# Patient Record
Sex: Male | Born: 1935
Health system: Southern US, Community
[De-identification: ages and names within clinical notes are randomized; demographics above are authoritative.]

## PROBLEM LIST (undated history)

## (undated) DIAGNOSIS — I219 Acute myocardial infarction, unspecified: Secondary | ICD-10-CM

## (undated) DIAGNOSIS — J189 Pneumonia, unspecified organism: Secondary | ICD-10-CM

## (undated) DIAGNOSIS — Z8719 Personal history of other diseases of the digestive system: Secondary | ICD-10-CM

## (undated) DIAGNOSIS — S065XAA Traumatic subdural hemorrhage with loss of consciousness status unknown, initial encounter: Secondary | ICD-10-CM

## (undated) DIAGNOSIS — I5022 Chronic systolic (congestive) heart failure: Secondary | ICD-10-CM

## (undated) DIAGNOSIS — I472 Ventricular tachycardia, unspecified: Secondary | ICD-10-CM

## (undated) DIAGNOSIS — I499 Cardiac arrhythmia, unspecified: Secondary | ICD-10-CM

## (undated) DIAGNOSIS — Z79899 Other long term (current) drug therapy: Secondary | ICD-10-CM

## (undated) DIAGNOSIS — Z95810 Presence of automatic (implantable) cardiac defibrillator: Secondary | ICD-10-CM

## (undated) DIAGNOSIS — S065X9A Traumatic subdural hemorrhage with loss of consciousness of unspecified duration, initial encounter: Secondary | ICD-10-CM

## (undated) DIAGNOSIS — F0390 Unspecified dementia without behavioral disturbance: Secondary | ICD-10-CM

## (undated) DIAGNOSIS — I10 Essential (primary) hypertension: Secondary | ICD-10-CM

## (undated) DIAGNOSIS — F32A Depression, unspecified: Secondary | ICD-10-CM

## (undated) DIAGNOSIS — E039 Hypothyroidism, unspecified: Secondary | ICD-10-CM

## (undated) DIAGNOSIS — I509 Heart failure, unspecified: Secondary | ICD-10-CM

## (undated) DIAGNOSIS — I2510 Atherosclerotic heart disease of native coronary artery without angina pectoris: Secondary | ICD-10-CM

## (undated) DIAGNOSIS — T8859XA Other complications of anesthesia, initial encounter: Secondary | ICD-10-CM

## (undated) DIAGNOSIS — I498 Other specified cardiac arrhythmias: Secondary | ICD-10-CM

## (undated) DIAGNOSIS — E785 Hyperlipidemia, unspecified: Secondary | ICD-10-CM

## (undated) DIAGNOSIS — Z950 Presence of cardiac pacemaker: Secondary | ICD-10-CM

## (undated) DIAGNOSIS — I4901 Ventricular fibrillation: Secondary | ICD-10-CM

## (undated) DIAGNOSIS — I255 Ischemic cardiomyopathy: Secondary | ICD-10-CM

## (undated) DIAGNOSIS — E079 Disorder of thyroid, unspecified: Secondary | ICD-10-CM

## (undated) HISTORY — DX: Ventricular tachycardia, unspecified: I47.20

## (undated) HISTORY — DX: Other long term (current) drug therapy: Z79.899

## (undated) HISTORY — DX: Traumatic subdural hemorrhage with loss of consciousness status unknown, initial encounter: S06.5XAA

## (undated) HISTORY — DX: Ventricular fibrillation: I49.01

## (undated) HISTORY — DX: Disorder of thyroid, unspecified: E07.9

## (undated) HISTORY — DX: Essential (primary) hypertension: I10

## (undated) HISTORY — DX: Hyperlipidemia, unspecified: E78.5

## (undated) HISTORY — PX: OTHER SURGICAL HISTORY: SHX169

## (undated) HISTORY — DX: Presence of automatic (implantable) cardiac defibrillator: Z95.810

## (undated) HISTORY — DX: Ventricular tachycardia: I47.2

## (undated) HISTORY — DX: Ischemic cardiomyopathy: I25.5

## (undated) HISTORY — PX: TONSILLECTOMY: SUR1361

## (undated) HISTORY — DX: Other specified cardiac arrhythmias: I49.8

## (undated) HISTORY — DX: Heart failure, unspecified: I50.9

## (undated) HISTORY — DX: Acute myocardial infarction, unspecified: I21.9

## (undated) HISTORY — DX: Chronic systolic (congestive) heart failure: I50.22

## (undated) HISTORY — PX: MANDIBLE SURGERY: SHX707

## (undated) HISTORY — DX: Traumatic subdural hemorrhage with loss of consciousness of unspecified duration, initial encounter: S06.5X9A

---

## 1993-12-29 HISTORY — PX: CORONARY ARTERY BYPASS GRAFT: SHX141

## 1993-12-29 HISTORY — PX: BYPASS GRAFT: SHX909

## 1998-04-27 ENCOUNTER — Ambulatory Visit (HOSPITAL_COMMUNITY): Admission: RE | Admit: 1998-04-27 | Discharge: 1998-04-27 | Payer: Self-pay | Admitting: Cardiovascular Disease

## 1998-05-17 ENCOUNTER — Ambulatory Visit (HOSPITAL_COMMUNITY): Admission: RE | Admit: 1998-05-17 | Discharge: 1998-05-17 | Payer: Self-pay | Admitting: Surgery

## 1999-10-21 ENCOUNTER — Encounter: Payer: Self-pay | Admitting: Emergency Medicine

## 1999-10-21 ENCOUNTER — Inpatient Hospital Stay (HOSPITAL_COMMUNITY): Admission: EM | Admit: 1999-10-21 | Discharge: 1999-10-29 | Payer: Self-pay | Admitting: Emergency Medicine

## 1999-10-21 ENCOUNTER — Encounter (INDEPENDENT_AMBULATORY_CARE_PROVIDER_SITE_OTHER): Payer: Self-pay

## 1999-10-23 ENCOUNTER — Encounter: Payer: Self-pay | Admitting: Oral Surgery

## 1999-10-24 ENCOUNTER — Encounter: Payer: Self-pay | Admitting: Oral Surgery

## 1999-10-28 ENCOUNTER — Encounter: Payer: Self-pay | Admitting: Oral Surgery

## 2000-11-05 ENCOUNTER — Encounter: Payer: Self-pay | Admitting: Internal Medicine

## 2000-11-05 ENCOUNTER — Ambulatory Visit (HOSPITAL_COMMUNITY): Admission: RE | Admit: 2000-11-05 | Discharge: 2000-11-05 | Payer: Self-pay | Admitting: Neurology

## 2000-11-05 ENCOUNTER — Ambulatory Visit (HOSPITAL_COMMUNITY): Admission: RE | Admit: 2000-11-05 | Discharge: 2000-11-05 | Payer: Self-pay | Admitting: Internal Medicine

## 2000-11-06 ENCOUNTER — Ambulatory Visit (HOSPITAL_COMMUNITY): Admission: RE | Admit: 2000-11-06 | Discharge: 2000-11-17 | Payer: Self-pay | Admitting: Internal Medicine

## 2000-11-12 ENCOUNTER — Ambulatory Visit: Admission: RE | Admit: 2000-11-12 | Discharge: 2000-11-12 | Payer: Self-pay | Admitting: Cardiothoracic Surgery

## 2000-11-12 ENCOUNTER — Encounter: Payer: Self-pay | Admitting: Cardiothoracic Surgery

## 2004-01-30 ENCOUNTER — Ambulatory Visit (HOSPITAL_COMMUNITY): Admission: RE | Admit: 2004-01-30 | Discharge: 2004-01-30 | Payer: Self-pay | Admitting: Internal Medicine

## 2004-01-30 ENCOUNTER — Encounter (INDEPENDENT_AMBULATORY_CARE_PROVIDER_SITE_OTHER): Payer: Self-pay | Admitting: Specialist

## 2005-01-01 ENCOUNTER — Ambulatory Visit: Payer: Self-pay | Admitting: Internal Medicine

## 2005-04-28 ENCOUNTER — Ambulatory Visit: Payer: Self-pay

## 2005-09-08 ENCOUNTER — Ambulatory Visit: Payer: Self-pay | Admitting: Internal Medicine

## 2005-11-09 ENCOUNTER — Emergency Department (HOSPITAL_COMMUNITY): Admission: EM | Admit: 2005-11-09 | Discharge: 2005-11-09 | Payer: Self-pay | Admitting: Emergency Medicine

## 2005-11-10 ENCOUNTER — Inpatient Hospital Stay (HOSPITAL_COMMUNITY): Admission: AD | Admit: 2005-11-10 | Discharge: 2005-11-18 | Payer: Self-pay | Admitting: Neurosurgery

## 2005-11-10 ENCOUNTER — Encounter: Payer: Self-pay | Admitting: Internal Medicine

## 2005-11-28 ENCOUNTER — Encounter: Admission: RE | Admit: 2005-11-28 | Discharge: 2005-11-28 | Payer: Self-pay | Admitting: Cardiovascular Disease

## 2005-12-31 ENCOUNTER — Ambulatory Visit (HOSPITAL_COMMUNITY): Admission: RE | Admit: 2005-12-31 | Discharge: 2005-12-31 | Payer: Self-pay | Admitting: Neurosurgery

## 2006-01-05 ENCOUNTER — Ambulatory Visit: Payer: Self-pay | Admitting: Internal Medicine

## 2006-05-04 ENCOUNTER — Ambulatory Visit: Payer: Self-pay | Admitting: Internal Medicine

## 2006-09-08 ENCOUNTER — Ambulatory Visit: Payer: Self-pay | Admitting: Internal Medicine

## 2007-01-21 ENCOUNTER — Ambulatory Visit: Payer: Self-pay | Admitting: Internal Medicine

## 2007-01-21 LAB — CONVERTED CEMR LAB
CO2: 31 meq/L (ref 19–32)
Calcium: 9 mg/dL (ref 8.4–10.5)
GFR calc Af Amer: 85 mL/min
Glucose, Bld: 97 mg/dL (ref 70–99)
HCT: 43.7 % (ref 39.0–52.0)
Hemoglobin: 15.1 g/dL (ref 13.0–17.0)
INR: 1.3 (ref 0.9–2.0)
MCHC: 34.5 g/dL (ref 30.0–36.0)
MCV: 91.7 fL (ref 78.0–100.0)
Platelets: 241 10*3/uL (ref 150–400)
Sodium: 142 meq/L (ref 135–145)

## 2007-01-25 ENCOUNTER — Encounter: Payer: Self-pay | Admitting: Cardiovascular Disease

## 2007-01-26 ENCOUNTER — Ambulatory Visit: Payer: Self-pay | Admitting: Internal Medicine

## 2007-02-01 ENCOUNTER — Ambulatory Visit: Payer: Self-pay | Admitting: Internal Medicine

## 2007-02-01 ENCOUNTER — Ambulatory Visit (HOSPITAL_COMMUNITY): Admission: RE | Admit: 2007-02-01 | Discharge: 2007-02-01 | Payer: Self-pay | Admitting: Internal Medicine

## 2007-02-15 ENCOUNTER — Ambulatory Visit: Payer: Self-pay

## 2007-03-05 ENCOUNTER — Ambulatory Visit: Payer: Self-pay | Admitting: Internal Medicine

## 2007-05-25 ENCOUNTER — Ambulatory Visit: Payer: Self-pay | Admitting: Internal Medicine

## 2007-08-24 ENCOUNTER — Ambulatory Visit: Payer: Self-pay | Admitting: Internal Medicine

## 2007-11-23 ENCOUNTER — Ambulatory Visit: Payer: Self-pay | Admitting: Internal Medicine

## 2008-01-31 ENCOUNTER — Ambulatory Visit: Payer: Self-pay | Admitting: Internal Medicine

## 2008-05-02 ENCOUNTER — Ambulatory Visit: Payer: Self-pay | Admitting: Internal Medicine

## 2008-08-01 ENCOUNTER — Ambulatory Visit: Payer: Self-pay | Admitting: Internal Medicine

## 2008-10-31 ENCOUNTER — Ambulatory Visit: Payer: Self-pay | Admitting: Internal Medicine

## 2009-02-09 ENCOUNTER — Ambulatory Visit: Payer: Self-pay | Admitting: Internal Medicine

## 2009-05-08 ENCOUNTER — Ambulatory Visit: Payer: Self-pay | Admitting: Internal Medicine

## 2009-05-18 ENCOUNTER — Encounter: Payer: Self-pay | Admitting: Internal Medicine

## 2009-07-24 ENCOUNTER — Encounter: Payer: Self-pay | Admitting: Cardiovascular Disease

## 2009-08-02 ENCOUNTER — Encounter: Payer: Self-pay | Admitting: Internal Medicine

## 2009-08-07 ENCOUNTER — Ambulatory Visit: Payer: Self-pay | Admitting: Internal Medicine

## 2009-08-09 ENCOUNTER — Encounter: Payer: Self-pay | Admitting: Internal Medicine

## 2009-09-12 ENCOUNTER — Ambulatory Visit: Payer: Self-pay | Admitting: Internal Medicine

## 2009-12-11 ENCOUNTER — Ambulatory Visit: Payer: Self-pay | Admitting: Internal Medicine

## 2009-12-12 ENCOUNTER — Encounter: Payer: Self-pay | Admitting: Internal Medicine

## 2009-12-31 ENCOUNTER — Encounter: Payer: Self-pay | Admitting: Internal Medicine

## 2010-03-19 ENCOUNTER — Ambulatory Visit: Payer: Self-pay | Admitting: Internal Medicine

## 2010-03-19 DIAGNOSIS — E785 Hyperlipidemia, unspecified: Secondary | ICD-10-CM

## 2010-03-19 DIAGNOSIS — I10 Essential (primary) hypertension: Secondary | ICD-10-CM

## 2010-06-18 ENCOUNTER — Ambulatory Visit: Payer: Self-pay | Admitting: Internal Medicine

## 2010-07-10 ENCOUNTER — Encounter: Payer: Self-pay | Admitting: Internal Medicine

## 2010-08-16 ENCOUNTER — Ambulatory Visit: Payer: Self-pay | Admitting: Cardiovascular Disease

## 2010-08-16 ENCOUNTER — Encounter: Payer: Self-pay | Admitting: Internal Medicine

## 2010-09-26 ENCOUNTER — Ambulatory Visit: Payer: Self-pay | Admitting: Internal Medicine

## 2010-11-20 ENCOUNTER — Encounter: Payer: Self-pay | Admitting: Internal Medicine

## 2011-01-02 ENCOUNTER — Ambulatory Visit
Admission: RE | Admit: 2011-01-02 | Discharge: 2011-01-02 | Payer: Self-pay | Source: Home / Self Care | Attending: Internal Medicine | Admitting: Internal Medicine

## 2011-01-02 ENCOUNTER — Encounter: Payer: Self-pay | Admitting: Internal Medicine

## 2011-01-19 ENCOUNTER — Inpatient Hospital Stay (HOSPITAL_COMMUNITY)
Admission: EM | Admit: 2011-01-19 | Discharge: 2011-01-23 | Payer: Self-pay | Source: Home / Self Care | Attending: Cardiology | Admitting: Cardiology

## 2011-01-20 ENCOUNTER — Encounter: Payer: Self-pay | Admitting: Internal Medicine

## 2011-01-21 LAB — BASIC METABOLIC PANEL
Calcium: 8.9 mg/dL (ref 8.4–10.5)
GFR calc non Af Amer: >60 mL/min (ref >60)
Glucose, Bld: 162 mg/dL — ABNORMAL HIGH (ref 70–99)
Potassium: 4.3 mEq/L (ref 3.5–5.1)
Sodium: 139 mEq/L (ref 135–145)

## 2011-01-21 LAB — POCT I-STAT, CHEM 8
BUN: 20 mg/dL (ref 6–23)
Calcium, Ion: 1.13 mmol/L (ref 1.12–1.32)
Chloride: 105 mEq/L (ref 96–112)
Creatinine, Ser: 1.5 mg/dL (ref 0.4–1.5)
Glucose, Bld: 159 mg/dL — ABNORMAL HIGH (ref 70–99)
HCT: 48 % (ref 39.0–52.0)
Hemoglobin: 16.3 g/dL (ref 13.0–17.0)
Potassium: 4.5 mEq/L (ref 3.5–5.1)
Sodium: 141 mEq/L (ref 135–145)
TCO2: 24 mmol/L (ref 0–100)

## 2011-01-21 LAB — MRSA PCR SCREENING: MRSA by PCR: POSITIVE — AB

## 2011-01-21 LAB — CARDIAC PANEL(CRET KIN+CKTOT+MB+TROPI)
CK, MB: 8.4 ng/mL (ref 0.3–4.0)
Relative Index: 4.9 — ABNORMAL HIGH (ref 0.0–2.5)
Total CK: 173 U/L (ref 7–232)
Total CK: 146 U/L (ref 7–232)

## 2011-01-21 LAB — POCT CARDIAC MARKERS
CKMB, poc: 2.5 ng/mL (ref 1.0–8.0)
Myoglobin, poc: 212 ng/mL (ref 12–200)
Troponin i, poc: 0.13 ng/mL — ABNORMAL HIGH (ref 0.00–0.09)

## 2011-01-21 LAB — MAGNESIUM: Magnesium: 2 mg/dL (ref 1.5–2.5)

## 2011-01-21 LAB — CK TOTAL AND CKMB (NOT AT ARMC): Total CK: 143 U/L (ref 7–232)

## 2011-01-21 LAB — TSH: TSH: 3.695 u[IU]/mL (ref 0.350–4.500)

## 2011-01-22 LAB — CBC
HCT: 40.6 % (ref 39.0–52.0)
Hemoglobin: 15.6 g/dL (ref 13.0–17.0)
MCH: 31.7 pg (ref 26.0–34.0)
MCHC: 33.8 g/dL (ref 30.0–36.0)
MCV: 93.7 fL (ref 78.0–100.0)
MCV: 92.5 fL (ref 78.0–100.0)
RBC: 4.92 MIL/uL (ref 4.22–5.81)
RBC: 4.39 MIL/uL (ref 4.22–5.81)
WBC: 7.6 10*3/uL (ref 4.0–10.5)

## 2011-01-22 LAB — BASIC METABOLIC PANEL
BUN: 11 mg/dL (ref 6–23)
CO2: 24 mEq/L (ref 19–32)
Chloride: 105 mEq/L (ref 96–112)
Glucose, Bld: 102 mg/dL — ABNORMAL HIGH (ref 70–99)
Potassium: 3.5 mEq/L (ref 3.5–5.1)

## 2011-01-22 NOTE — Procedures (Addendum)
NAMEARBIN, WOOTTON NO.:  [id number]  MEDICAL RECORD NO.:  [id number]          PATIENT TYPE:  INP  LOCATION:  2041                         FACILITY:  [facility]  PHYSICIAN:  Lauree Chandler, MDDATE OF BIRTH:  28-Dec-1935  DATE OF PROCEDURE:  01/21/2011 DATE OF DISCHARGE:                           CARDIAC CATHETERIZATION   PRIMARY CARDIOLOGIST:  Thayer Headings, MD  PRIMARY CARE PHYSICIAN:  Burnard Bunting, MD  PROCEDURES PERFORMED: 1. Left heart catheterization. 2. Selective coronary angiography. 3. Saphenous vein graft angiography. 4. Left internal mammary artery graft angiography. 5. Left ventricular angiogram.  OPERATOR:  Lauree Chandler, MD  INDICATIONS:  This is a 76 year old Caucasian male with a history of ischemic cardiomyopathy with coronary artery disease and prior bypass surgery in 1995, who was admitted to the hospital after episodes of ventricular fibrillation when his defibrillator shocked him.  The patient had a mild elevation in troponin at 0.13.  Diagnostic catheterization was arranged for today to exclude progression of his coronary artery disease as the inciting event for his arrhythmia.  DETAILS OF PROCEDURE:  The patient was brought to the Main Cardiac Catheterization Laboratory after signing informed consent for the procedure.  The right groin was prepped and draped in sterile fashion. Lidocaine 1% was used for local anesthesia.  A 5-French sheath was inserted into the right femoral artery without difficulty.  A JL-4 catheter was used to selectively engage and inject the native left coronary artery.  A JR-4 catheter was used to selectively engage and inject call four saphenous vein grafts.  An IMA catheter was used to selectively engage and inject the left internal mammary artery graft.  A no torque right catheter was used to selectively engage and inject the native right coronary artery.  A pigtail catheter was used to  perform a left ventricular angiogram.  The patient tolerated the procedure well and was taken to the holding area in stable condition.  HEMODYNAMIC FINDINGS:  Central aortic pressure 107/53.  Left ventricular pressure 107/5.  Left ventricular end-diastolic pressure 14.  ANGIOGRAPHIC FINDINGS: 1. The left main coronary artery was short and appeared to have a 40%     ostial stenosis. 2. The left anterior descending had a 100% proximal occlusion.  The     mid and distal vessel filled from the saphenous vein graft.  A     moderate-sized diagonal branch filled from the saphenous vein     graft. 3. The circumflex artery had proximal 30% stenosis and filled a large     obtuse marginal branch from native flow.  The AV groove circumflex     terminated in moderate-sized obtuse marginal branch that filled     both from native flow and from the saphenous vein graft. 4. The native right coronary artery had diffuse 80% stenosis     throughout the proximal segment and 100% mid occlusion.  The distal     vessel and the PDA filled from the saphenous vein graft. 5. The saphenous vein graft to the PDA is patent. 6. Saphenous vein graft to the mid LAD is patent. 7. Saphenous vein graft to the diagonal is patent.  8. Saphenous vein graft to the second obtuse marginal branch of the     circumflex artery is patent. 9. Left internal mammary artery graft to the ramus intermediate vessel     is small and atretic with no significant flow. 10.Left ventricular angiogram was performed in the RAO projection     which showed akinesis of the anterior wall and apex with ejection     fraction of 15-20%.  IMPRESSION: 1. Triple-vessel coronary artery disease, status post five-vessel     coronary artery bypass graft with 4/5 patent bypass grafts. 2. No indication currently for percutaneous coronary intervention. 3. Severe left ventricular systolic dysfunction.  RECOMMENDATIONS:  I recommend continued medical  management at this time.     Lauree Chandler, MD     CM/MEDQ  D:  01/21/2011  T:  01/21/2011  Job:  [id number]  cc:   Thayer Headings, M.D. Burnard Bunting, M.D.  Electronically Signed by Lauree Chandler MD on 01/22/2011 04:13:03 PM
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
## 2011-01-30 NOTE — Cardiovascular Report (Signed)
Summary: Office Visit Remote   Office Visit Remote   Imported By: Sallee Provencal 01/03/2010 12:24:28  _____________________________________________________________________  External Attachment:    Type:   Image     Comment:   External Document

## 2011-01-30 NOTE — Cardiovascular Report (Signed)
Summary: Office Visit Remote   Office Visit Remote   Imported By: Sallee Provencal 07/11/2010 16:43:32  _____________________________________________________________________  External Attachment:    Type:   Image     Comment:   External Document

## 2011-01-30 NOTE — Letter (Signed)
Summary: Remote Device Check  [facility], [place]  [id number] [place]   [place], [place] [address]   Phone: [phone]  Fax: [phone]     November 20, 2010 MRN: [id number]   [place], [place]  [address]   Dear Mr. Cecena,   Your remote transmission was recieved and reviewed by your physician.  All diagnostics were within normal limits for you.  _____Your next transmission is scheduled for:  01-02-2011.  Please transmit at any time this day.  If you have a wireless device your transmission will be sent automatically.   Sincerely,  Shelly Bombard

## 2011-01-30 NOTE — Letter (Signed)
Summary: Remote Device Check  [facility], [place]  [id number] [place]   [place], [place] [address]   Phone: [phone]  Fax: [phone]     July 10, 2010 MRN: [id number]   [place], [place]  [address]   Dear Mr. Reisner,   Your remote transmission was recieved and reviewed by your physician.  All diagnostics were within normal limits for you.  __X___Your next transmission is scheduled for: 09-26-10.  Please transmit at any time this day.  If you have a wireless device your transmission will be sent automatically.    Sincerely,  Shelly Bombard

## 2011-01-30 NOTE — Assessment & Plan Note (Signed)
Summary: defib [email or web address]   CC:  defib check///.  History of Present Illness:   [name] is seen in followup for polymorphic ventricular tachycardia inthe setting of severe ischeimic cardiomyopathy with EF about 15-20% but only class 1-2 symptoms.    He has a history of atrial arrhythmias for which he previously took coumadin, but htis was stopped  following subdural hematoma   He has had no intercurrent discharges and no sob, cp or peripheral edema.   Current Medications (verified): 1)  Coreg 6.25 Mg Tabs (Carvedilol) .... Take One Tablet Two Times A Day 2)  Lisinopril 10 Mg Tabs (Lisinopril) .... Once Daily 3)  Spironolactone 25 Mg Tabs (Spironolactone) .... Once Daily 4)  Digoxin 0.125 Mg Tabs (Digoxin) .... Take One Tablet Once Daily 5)  Gemfibrozil 600 Mg Tabs (Gemfibrozil) .... Take One Tablet Two Times A Day 6)  Aspirin 81 Mg Tbec (Aspirin) .... Take One Tablet By Mouth Daily 7)  Slo-Niacin 500 Mg Cr-Tabs (Niacin) .... Take 4 Tablets Once Daily  Allergies (verified): No Known Drug Allergies  Past History:  Past Medical History:  Past Medical History: ICD implanted for inducible ventricular tachycardia/ventricular      fibrillation.--Medtronic Maximo DR [phone] Ischemic cardiomyopathy      a.     Myocardial infarction 1995.      b.     Three-vessel coronary artery disease status post coronary       artery bypass graft surgery.      c.     I-II chronic systolic congestive heart failure.      d.     Ejection fraction 29% on Myoview.  No ischemia. Weight loss of 48 pounds since 1995 when he had his heart attack. Cardioverter defibrillator discharges x2 on Saturday, February 2,      when the patient was walking briskly. He is totally asymptomatic      with that. Dyslipidemia Hypertension  Past Surgical History: Bilateral craniotomies for subdural hematomas implanted defibrillator-Medtronic Maximo DR [id number]  Vital Signs:  Patient profile:   76 year old  male Height:      71.5 inches Weight:      166 pounds BMI:     22.91 Pulse rate:   74 / minute Pulse rhythm:   regular BP sitting:   98 / 62  (left arm) Cuff size:   regular  Vitals Entered By: Doug Sou CMA (March 19, 2010 4:19 PM)  Physical Exam  General:  The patient was alert and oriented in no acute distress. HEENT Normal.  Neck veins were flat, carotids were brisk.  Lungs were clear.  Heart sounds were regular without murmurs or gallops.  Abdomen was soft with active bowel sounds. There is no clubbing cyanosis or edema. Skin Warm and dry     ICD Specifications Following MD:  Virl Axe, MD     ICD Vendor:  Medtronic     ICD Model Number:  [id number]     ICD Serial Number:  [id number] ICD DOI:  02/01/2007     ICD Implanting MD:  Virl Axe, MD  Lead 1:    Location: RA     DOI: 05/17/1998     Model #: [id number]     Serial #: [id number]     Status: active Lead 2:    Location: RV     DOI: 12/25/2000     Model #: [id number]     Serial #: [id number]     Status: active  ICD Follow Up Remote  Check?  No Battery Voltage:  3.10 V     Charge Time:  9.4 seconds     Underlying rhythm:  SR ICD Dependent:  No       ICD Device Measurements Atrium:  Amplitude: 2.6 mV, Impedance: 504 ohms, Threshold: 1.0 V at 0.2 msec Right Ventricle:  Amplitude: 8.9 mV, Impedance: 464 ohms, Threshold: 1.0 V at 0.2 msec Shock Impedance: 40/53 ohms   Episodes MS Episodes:  331     Coumadin:  No Shock:  0     ATP:  0     Nonsustained:  21     Atrial Pacing:  72.4%     Ventricular Pacing:  31.5%  Brady Parameters Mode DDDR     Lower Rate Limit:  60     Upper Rate Limit 110 PAV 300     Sensed AV Delay:  300  Tachy Zones VF:  200     VT:  250 FVT VIA VF     VT1:  150     Next Remote Date:  06/18/2010     Next Cardiology Appt Due:  02/27/2011 Tech Comments:  Normal device function. SAV changed from 110 to 341msec today to allow for intrinsic conduction while A pacing. No other changes made.  All mode switch  episodes less than 1 minute.  No EGM's for NSVT episodes.  Pt does [facility] transmissions.  ROV 12 months SK. Chanetta Marshall RN BSN  March 19, 2010 4:42 PM   Impression & Recommendations:  Problem # 1:  VENTRICULAR TACHYCARDIA-POLYMORPHIC (ICD-427.1) no intercurrent arrhtyhmias His updated medication list for this problem includes:    Coreg 6.25 Mg Tabs (Carvedilol) .[name] Kitchen... Take one tablet two times a day    Lisinopril 10 Mg Tabs (Lisinopril) ..... Once daily    Aspirin 81 Mg Tbec (Aspirin) .[name] Kitchen... Take one tablet by mouth daily  Problem # 2:  IMPLANTATION OF DEFIBRILLATOR-MEDTRONIC MAXIMO DR [id number] (ICD-V45.02) Device parameters and data were reviewed and no changes were made  Problem # 3:  CARDIOMYOPATHY, ISCHEMIC EF 20% (ICD-414.8) No symptoms,  will continue current meds  His updated medication list for this problem includes:    Coreg 6.25 Mg Tabs (Carvedilol) .[name] Kitchen... Take one tablet two times a day    Lisinopril 10 Mg Tabs (Lisinopril) ..... Once daily    Spironolactone 25 Mg Tabs (Spironolactone) ..... Once daily    Digoxin 0.125 Mg Tabs (Digoxin) .[name] Kitchen... Take one tablet once daily    Aspirin 81 Mg Tbec (Aspirin) .[name] Kitchen... Take one tablet by mouth daily

## 2011-01-30 NOTE — Letter (Signed)
Summary: [facility] Progress Note   [facility] Progress Note   Imported By: Sallee Provencal 08/23/2010 10:57:57  _____________________________________________________________________  External Attachment:    Type:   Image     Comment:   External Document

## 2011-01-30 NOTE — Letter (Signed)
Summary: Remote Device Check  [facility], [place]  [id number] [place]   [place], [place] [address]   Phone: [phone]  Fax: [phone]     December 31, 2009 MRN: [id number]   [place], [place]  [address]   Dear Mr. Gasior,   Your remote transmission was recieved and reviewed by your physician.  All diagnostics were within normal limits for you.    ___X___Your next office visit is scheduled for:  FEBRUARY 2011 [place]. Please call our office to schedule an appointment.    Sincerely,  Hotel manager

## 2011-01-30 NOTE — Cardiovascular Report (Signed)
Summary: Office Visit Remote   Office Visit Remote   Imported By: Sallee Provencal 12/02/2010 14:58:56  _____________________________________________________________________  External Attachment:    Type:   Image     Comment:   External Document
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
## 2011-02-07 ENCOUNTER — Encounter: Payer: Self-pay | Admitting: Internal Medicine

## 2011-02-07 ENCOUNTER — Encounter (INDEPENDENT_AMBULATORY_CARE_PROVIDER_SITE_OTHER): Payer: Medicare Other | Admitting: Internal Medicine

## 2011-02-07 DIAGNOSIS — I2589 Other forms of chronic ischemic heart disease: Secondary | ICD-10-CM

## 2011-02-07 DIAGNOSIS — R110 Nausea: Secondary | ICD-10-CM

## 2011-02-07 DIAGNOSIS — R259 Unspecified abnormal involuntary movements: Secondary | ICD-10-CM

## 2011-02-07 DIAGNOSIS — I472 Ventricular tachycardia: Secondary | ICD-10-CM

## 2011-02-07 NOTE — Discharge Summary (Signed)
NAMEPORFIRIO, [name] NO.:  [id number]  MEDICAL RECORD NO.:  [id number]           PATIENT TYPE:  LOCATION:                                 FACILITY:  PHYSICIAN:  Thayer Headings, M.D. DATE OF BIRTH:  December 06, 1935  DATE OF ADMISSION:  01/19/2011 DATE OF DISCHARGE:                              DISCHARGE SUMMARY   DATE OF DISCHARGE:  January 23, 2011.  PROCEDURES: 1. Left heart catheterization. 2. Coronary arteriogram. 3. Left ventriculogram. 4. LIMA arteriogram. 5. Saphenous vein angiogram. 6. Chest x-ray.  PRIMARY FINAL DISCHARGE DIAGNOSES: 1. Ventricular tachycardia storm. 2. Non-ST elevation MI and coronary artery disease with four out of five grafts patent at     catheterization.  SECONDARY DIAGNOSES: 1. Status post aortocoronary bypass surgery in 1995 with SVG to LAD,     SVG to diagonal, LIMA to ramus intermedius, SVG to OM-2, SVG to     PDA. 2. Status post cardiac catheterization this admission showing severe     native three-vessel disease with all grafts patent except for the     LIMA to ramus, which was atretic. 3. Ischemic cardiomyopathy with an ejection fraction of 15% to 20% at     catheterization. 4. History of Ventricular fibrillation arrest in 1995 at the time of     his bypass as well as ventricular tachycardia, status post     Medtronic subpectoral ICD with a change out to a Medtronic Maximo     in February 2008. 5. History of myocardial infarction in 1995. 6. Dyslipidemia. 7. Hypertension. 8. History of chronic subdural hematomas in 2006 requiring surgical     intervention, Coumadin discontinued. 9. History of Coumadin use secondary to left ventricular dysfunction,     discontinued. 10.History of colonoscopy showing diverticulosis and colon polyps. 0000000 systolic congestive heart failure.  Time at discharge, 44     minutes.  HOSPITAL COURSE:  Mr. [name] is a 76 year old male with a history of coronary artery disease.  He  came to the hospital on the day of admission because his defibrillator fired.  He was admitted for further evaluation and treatment.  The defibrillator was interrogated, and he was found to have multiple episodes of VT.  His platelets were slightly low, giving him mild thrombocytopenia and were 142 at discharge.  He ruled in for non-ST- segment elevation MI with elevated cardiac enzymes, peak troponin 2.03, and peak CK-MB 143/8.7.  He was MRSA positive by PCR.  His TSH was within normal limits.  His blood sugars showed some elevation with a fasting blood sugar of 102.  He has had some mild elevation in his blood sugars before, but no hemoglobin A1c was done.  He will be encouraged to follow up with his family physician for this.  Because of his non-ST-segment elevation MI, he had a cath on January 21, 2011.  He had severe native three-vessel disease, but all grafts were patent except for the LIMA to ramus intermedius, which was atretic. That graft had no significant flow.  His EF was 15% to 20%.  Dr. Merlene Pulling reviewed the films and discussed the results with  Dr. Acie Fredrickson. Medical therapy was recommended, and there were no vessels amenable to percutaneous intervention.  Mr. [name] had been started on IV amiodarone because of the VT storm. His device was interrogated, and the MVP detection interval was increased to 261 beats per minute (230 milliseconds).  He will follow up with Dr. Caryl Comes as an outpatient.  On January 22, 2011, he was changed from IV to p.o. amiodarone and kept another 24 hours to make sure he did not have any further VT.  On January 23, 2011, Mr. [name] condition was stable.  He was having short runs of nonsustained VT, but was asymptomatic.  He was tolerating the amiodarone well.  He was evaluated by Dr. Acie Fredrickson and considered stable for discharge in improved condition, to follow up as an outpatient.  DISCHARGE INSTRUCTIONS:  His activity level is to be  increased gradually.  He is to call our office for problems with the cath site. He is to follow up with Dr. Acie Fredrickson in approximately 4 weeks, and an appointment will be arranged.  He is to follow up with Dr. Caryl Comes on February 10 at 9:10 a.m.  He is to follow up with Dr. Reynaldo Minium as needed.  DISCHARGE MEDICATIONS: 1. Amiodarone 200 mg 2 tablets b.i.d. for 2 weeks and 1 tablet b.i.d.     for 2 weeks, then 1 tablet daily. 2. Lisinopril 10 mg daily. 3. Mupirocin ointment b.i.d. for 5 days. 4. Niacin 500 mg 4 caps at bedtime. 5. Coreg 6.25 mg b.i.d. 6. Lanoxin 0.125 mg daily. 7. Gemfibrozil 600 mg b.i.d. 8. Aldactone 25 mg daily. 9. Multivitamin daily. 10.Aspirin 81 mg daily. 11.Grape seed extract daily. 12.Vitamin B complex with C daily. 13.Flomax 0.4 mg at bedtime.    Rosaria Ferries, PA-C   ______________________________ Thayer Headings, M.D.   RB/MEDQ  D:  01/23/2011  T:  01/24/2011  Job:  [id number]  cc:   Burnard Bunting, M.D.  Electronically Signed by Rosaria Ferries PA-C on 01/29/2011 02:48:02 PM Electronically Signed by Mertie Moores M.D. on 02/07/2011 04:50:35 PM

## 2011-02-09 ENCOUNTER — Encounter (INDEPENDENT_AMBULATORY_CARE_PROVIDER_SITE_OTHER): Payer: Self-pay | Admitting: *Deleted

## 2011-02-13 NOTE — Consult Note (Addendum)
Summary: [facility] Consultation Report  [facility] Consultation Report   Imported By: Roddie Mc 01/31/2011 18:38:42  _____________________________________________________________________  External Attachment:    Type:   Image     Comment:   External Document

## 2011-02-18 ENCOUNTER — Ambulatory Visit (INDEPENDENT_AMBULATORY_CARE_PROVIDER_SITE_OTHER): Payer: Medicare Other | Admitting: Cardiovascular Disease

## 2011-02-18 ENCOUNTER — Encounter: Payer: Self-pay | Admitting: Internal Medicine

## 2011-02-18 DIAGNOSIS — I472 Ventricular tachycardia: Secondary | ICD-10-CM

## 2011-02-19 NOTE — Assessment & Plan Note (Signed)
Summary: defib check per amber/medtronic icd/lg   History of Present Illness:   [name] is seen in followup for polymorphic ventricular tachycardia inthe setting of severe ischeimic cardiomyopathy wtih prior bypass 1995 with EF about 15-20%;  he was recentlyt admitted for VT storm for which amiodarone was initiated.  cath demonstrated severe native three-vessel disease with all grafts patent except for the       LIMA to ramus, which was atretic. EF unchanged he is haveing problems with jitteriness and nausea  He has a history of atrial arrhythmias for which he previously took coumadin, but htis was stopped  following subdural hematoma     Current Medications (verified): 1)  Coreg 6.25 Mg Tabs (Carvedilol) .... Take One Tablet Two Times A Day 2)  Lisinopril 10 Mg Tabs (Lisinopril) .... Once Daily 3)  Spironolactone 25 Mg Tabs (Spironolactone) .... Once Daily 4)  Digoxin 0.125 Mg Tabs (Digoxin) .... Take One Tablet Once Daily 5)  Gemfibrozil 600 Mg Tabs (Gemfibrozil) .... Take One Tablet Two Times A Day 6)  Aspirin 81 Mg Tbec (Aspirin) .... Take One Tablet By Mouth Daily 7)  Slo-Niacin 500 Mg Cr-Tabs (Niacin) .... Take 4 Tablets Once Daily 8)  Amiodarone Hcl 200 Mg Tabs (Amiodarone Hcl) .... Take One Tablet Two Times A Day  Allergies (verified): No Known Drug Allergies  Past History:  Past Medical History: Last updated: 03/19/2010  Past Medical History: ICD implanted for inducible ventricular tachycardia/ventricular      fibrillation.--Medtronic Maximo DR [phone] Ischemic cardiomyopathy      a.     Myocardial infarction 1995.      b.     Three-vessel coronary artery disease status post coronary       artery bypass graft surgery.      c.     I-II chronic systolic congestive heart failure.      d.     Ejection fraction 29% on Myoview.  No ischemia. Weight loss of 48 pounds since 1995 when he had his heart attack. Cardioverter defibrillator discharges x2 on Saturday, February 2,     when the patient was walking briskly. He is totally asymptomatic      with that. Dyslipidemia Hypertension  Past Surgical History: Last updated: 03/19/2010 Bilateral craniotomies for subdural hematomas implanted defibrillator-Medtronic Maximo DR 7278  Vital Signs:  Patient profile:   76 year old male Height:      71.5 inches Weight:      166 pounds BMI:     22.91 Pulse rate:   63 / minute Pulse rhythm:   regular BP sitting:   118 / 64  (right arm)  Vitals Entered By: Doug Sou CMA (February 07, 2011 9:05 AM)  Physical Exam  General:  The patient was alert and oriented in no acute distress. HEENT Normal.  Neck veins were flat, carotids were brisk.  Lungs were clear.  Heart sounds were regular without murmurs or gallops.  Abdomen was soft with active bowel sounds. There is no clubbing cyanosis or edema. Skin Warm and dry    EKG  Procedure date:  02/07/2011  Findings:      atrial paced at 64 Intermittently conducted broad QRS of the right bundle left axis Her anteroseptal MI Intervals 0.16 equals QRS 0.44 equals q.d.   ICD Specifications Following MD:  Virl Axe, MD     ICD Vendor:  Medtronic     ICD Model Number:  [id number]     ICD Serial Number:  [id number] ICD DOI:  02/01/2007     ICD Implanting MD:  Virl Axe, MD  Lead 1:    Location: RA     DOI: 05/17/1998     Model #: [id number]     Serial #: [id number]     Status: active Lead 2:    Location: RV     DOI: 12/25/2000     Model #: [id number]     Serial #: [id number]     Status: active  ICD Follow Up Remote Check?  No Battery Voltage:  3.02 V     Charge Time:  8.12 seconds     Underlying rhythm:  SR ICD Dependent:  No       ICD Device Measurements Atrium:  Amplitude: 2.0 mV, Impedance: 472 ohms, Threshold: 1.0 V at 0.3 msec Right Ventricle:  Amplitude: 7.1 mV, Impedance: 480 ohms, Threshold: 1.0 V at 0.4 msec Shock Impedance: 40/52 ohms   Episodes MS Episodes:  90     Percent Mode Switch:  90     Coumadin:   No Shock:  0     ATP:  0     Nonsustained:  0     Atrial Pacing:  74.9%     Ventricular Pacing:  14.9%  Brady Parameters Mode DDDR     Lower Rate Limit:  60     Upper Rate Limit 110 PAV 300     Sensed AV Delay:  300  Tachy Zones VF:  200     VT:  250 FVT VIA VF     VT1:  150     Next Cardiology Appt Due:  03/21/2011 Tech Comments:  Lead impedance alert values reprogrammed.  No programming changes.  ROV 6 weeks with Dr. Nelly Laurence, LPN  February 10, [id number] 9:35 AM   Impression & Recommendations:  Problem # 1:  VENTRICULAR TACHYCARDIA-POLYMORPHIC (ICD-427.1) tpatient has had recent VT storm. The records were reviewed; he is on amiodarone. He had some problems with the amiodarone including nausea and jitteriness. I that these are dose related. We will plan to decrease his dose from 400-200 and about 3 weeks' time. I will see him in 6 weeks' time at that juncture we will need a TSH and LFTs. His updated medication list for this problem includes:    Coreg 6.25 Mg Tabs (Carvedilol) .[name] Kitchen... Take one tablet two times a day    Lisinopril 10 Mg Tabs (Lisinopril) ..... Once daily    Aspirin 81 Mg Tbec (Aspirin) .[name] Kitchen... Take one tablet by mouth daily    Amiodarone Hcl 200 Mg Tabs (Amiodarone hcl) .[name] Kitchen... Take one tablet two times a day  Orders: EKG w/ Interpretation (93000)  Problem # 2:  TREMOR (ICD-781.0) As above  Problem # 3:  NAUSEA (ICD-787.02)  as aboveHe is also on digoxin. There may be amiodarone digoxin interaction here we will measure his digoxin level Orders: T-Digoxin [id number])  Problem # 4:  CARDIOMYOPATHY, ISCHEMIC EF 20% (ICD-414.8) he is on excellent medications  Problem # 5:  IMPLANTATION OF DEFIBRILLATOR-MEDTRONIC MAXIMO DR 7278 (ICD-V45.02) Device parameters and data were reviewed and no changes were made  Patient Instructions: 1)  Your physician has recommended you make the following change in your medication: in 3 weeks decrease Amiodarone to 200mg  1 time per  day 2)  Your physician recommends that you return for lab work in: dig level today....we will call you with results. 3)  Your physician recommends that you schedule a follow-up appointment in: 6 weeks with Dr.Gurnoor Sloop 4)  needs TSH and Hepatic panel in 6 weeks 427.1

## 2011-02-19 NOTE — Cardiovascular Report (Signed)
Summary: Office Visit Remote   Office Visit Remote   Imported By: Sallee Provencal 02/11/2011 15:13:39  _____________________________________________________________________  External Attachment:    Type:   Image     Comment:   External Document

## 2011-02-19 NOTE — Letter (Signed)
Summary: Remote Device Check  [facility], [place]  [id number] [address] [place]   [place], [place] [address]   Phone: [phone]  Fax: [phone]     February 09, 2011 MRN: [id number]   Mora, Alejandro  [id number]   Dear [name],   Your remote transmission was recieved and reviewed by your physician.  All diagnostics were within normal limits for you.  __X____Your next office visit is scheduled for:   03-17-2011 @ 115pm with Dr Caryl Comes.     Sincerely,  Shelly Bombard

## 2011-02-25 NOTE — Letter (Signed)
Summary: [facility]   Imported By: Marilynne Drivers 02/19/2011 12:05:48  _____________________________________________________________________  External Attachment:    Type:   Image     Comment:   External Document

## 2011-02-28 ENCOUNTER — Ambulatory Visit (INDEPENDENT_AMBULATORY_CARE_PROVIDER_SITE_OTHER): Payer: Medicare Other | Admitting: Cardiovascular Disease

## 2011-02-28 DIAGNOSIS — Z951 Presence of aortocoronary bypass graft: Secondary | ICD-10-CM

## 2011-02-28 DIAGNOSIS — I5022 Chronic systolic (congestive) heart failure: Secondary | ICD-10-CM

## 2011-02-28 DIAGNOSIS — Z95810 Presence of automatic (implantable) cardiac defibrillator: Secondary | ICD-10-CM

## 2011-03-11 ENCOUNTER — Ambulatory Visit (INDEPENDENT_AMBULATORY_CARE_PROVIDER_SITE_OTHER): Payer: Medicare Other | Admitting: Cardiovascular Disease

## 2011-03-11 DIAGNOSIS — I472 Ventricular tachycardia: Secondary | ICD-10-CM

## 2011-03-11 DIAGNOSIS — I959 Hypotension, unspecified: Secondary | ICD-10-CM

## 2011-03-11 NOTE — Letter (Signed)
Summary: [facility] Progress Note   [facility] Progress Note   Imported By: Sallee Provencal 03/03/2011 15:25:30  _____________________________________________________________________  External Attachment:    Type:   Image     Comment:   External Document

## 2011-03-17 ENCOUNTER — Encounter: Payer: Self-pay | Admitting: Internal Medicine

## 2011-03-18 ENCOUNTER — Telehealth: Payer: Self-pay | Admitting: *Deleted

## 2011-03-18 ENCOUNTER — Encounter: Payer: Self-pay | Admitting: Internal Medicine

## 2011-03-18 NOTE — Telephone Encounter (Signed)
Assessed pt.  States he is feeling well.  Currently taking the Lisinopril 5mg  1/2 tab BID.  States his BP is stable and he is feeling well.  Will continue to monitor pt.

## 2011-03-19 ENCOUNTER — Telehealth: Payer: Self-pay | Admitting: *Deleted

## 2011-03-19 NOTE — Telephone Encounter (Signed)
Great.  Thank you for keeping up with him.

## 2011-03-19 NOTE — Telephone Encounter (Signed)
Called and assessed pt.  Pt states he is feeling well.   BP is stable 130/80;  Pt is still on Lisinopril 5mg  BID;  RN will call back next week to check on pt.  He was advised to call sooner if any problems occur.

## 2011-03-26 ENCOUNTER — Telehealth: Payer: Self-pay | Admitting: *Deleted

## 2011-03-26 NOTE — Telephone Encounter (Signed)
Called pt. To check on his status.  States he is feeling well.  No problems with low BP or dizziness noted;  Pt is on the way to beach for the weekend.  Instructed pt. To call if any problems arise.

## 2011-03-31 ENCOUNTER — Encounter: Payer: Self-pay | Admitting: Internal Medicine

## 2011-03-31 ENCOUNTER — Other Ambulatory Visit (INDEPENDENT_AMBULATORY_CARE_PROVIDER_SITE_OTHER): Payer: Medicare Other | Admitting: *Deleted

## 2011-03-31 ENCOUNTER — Ambulatory Visit (INDEPENDENT_AMBULATORY_CARE_PROVIDER_SITE_OTHER): Payer: Medicare Other | Admitting: Internal Medicine

## 2011-03-31 DIAGNOSIS — Z79899 Other long term (current) drug therapy: Secondary | ICD-10-CM

## 2011-03-31 DIAGNOSIS — R0989 Other specified symptoms and signs involving the circulatory and respiratory systems: Secondary | ICD-10-CM

## 2011-03-31 DIAGNOSIS — I472 Ventricular tachycardia, unspecified: Secondary | ICD-10-CM | POA: Insufficient documentation

## 2011-03-31 DIAGNOSIS — I4901 Ventricular fibrillation: Secondary | ICD-10-CM

## 2011-03-31 DIAGNOSIS — I2589 Other forms of chronic ischemic heart disease: Secondary | ICD-10-CM

## 2011-03-31 DIAGNOSIS — Z95810 Presence of automatic (implantable) cardiac defibrillator: Secondary | ICD-10-CM

## 2011-03-31 DIAGNOSIS — I5022 Chronic systolic (congestive) heart failure: Secondary | ICD-10-CM

## 2011-03-31 DIAGNOSIS — I255 Ischemic cardiomyopathy: Secondary | ICD-10-CM

## 2011-03-31 DIAGNOSIS — I498 Other specified cardiac arrhythmias: Secondary | ICD-10-CM

## 2011-03-31 DIAGNOSIS — I509 Heart failure, unspecified: Secondary | ICD-10-CM

## 2011-03-31 LAB — HEPATIC FUNCTION PANEL
ALT: 39 U/L (ref 0–53)
Total Bilirubin: 0.5 mg/dL (ref 0.3–1.2)

## 2011-03-31 LAB — BASIC METABOLIC PANEL
CO2: 27 mEq/L (ref 19–32)
Calcium: 8.8 mg/dL (ref 8.4–10.5)
Glucose, Bld: 82 mg/dL (ref 70–99)
Sodium: 134 mEq/L — ABNORMAL LOW (ref 135–145)

## 2011-03-31 NOTE — Patient Instructions (Signed)
Your physician recommends that you return for lab work in: today bmet liver tsh   Your physician recommends that you schedule a follow-up appointment in: 6 months WITH DR Caryl Comes

## 2011-03-31 NOTE — Assessment & Plan Note (Addendum)
No recurrent VT;  Continue amio at current dose  Will think about decreasing in about 6-9 months  amio surveillance labs due today

## 2011-03-31 NOTE — Progress Notes (Signed)
  HPI  [name] is a 76 y.o. male  Recently hospitalized for ventricular tachycardia storm and non-STEMI.  He has a history of ischemic cardiomyopathy with prior myocardial infarction, prior bypass surgery in 1995 with recent catheterization (January 2012) demonstrated patent vein grafts with an atretic LIMA to his ramus. Ejection fraction was 15-20%.  He was treated with amiodarone; when we saw  Him in Feb we noted nausea and jitteriness and decreased his amio from 400-200 daily;  These symptoms have largely abated.  Patient denies symptoms of GI intolerance, sun sensitivity, neurological symptoms attributable to amiodarone.    He has a history of reported cardiac arrest and is status post ICD implantation.   The patient denies chest pain, shortness of breath, nocturnal dyspnea, orthopnea or peripheral edema.  There have been no palpitations, lightheadedness or syncope.         Past Medical History  Diagnosis Date  . Myocardial infarction     1995  . CAD (coronary artery disease)   . Dyslipidemia   . Hypertension     Past Surgical History  Procedure Date  . Insert / replace / remove pacemaker     [place] DR [id number]  . Bilateral cranitomies for sub hematomas     Current Outpatient Prescriptions  Medication Sig Dispense Refill  . amiodarone (PACERONE) 200 MG tablet Take 200 mg by mouth daily.       [name] Kitchen aspirin 81 MG tablet Take 81 mg by mouth daily.        . carvedilol (COREG) 6.25 MG tablet Take 6.25 mg by mouth 2 (two) times daily with a meal.        . gemfibrozil (LOPID) 600 MG tablet Take 600 mg by mouth 2 (two) times daily before a meal.        . levothyroxine (SYNTHROID, LEVOTHROID) 25 MCG tablet Take 1 tablet by mouth daily.      [name] Kitchen lisinopril (PRINIVIL,ZESTRIL) 5 MG tablet Take 5 mg by mouth daily.        . niacin (SLO-NIACIN) 500 MG tablet Take 1,000 mg by mouth 2 (two) times daily with a meal.       . spironolactone (ALDACTONE) 25 MG tablet Take 25 mg by  mouth daily.        . Tamsulosin HCl (FLOMAX) 0.4 MG CAPS Take 1 tablet by mouth daily.      . digoxin (LANOXIN) 0.125 MG tablet Take 125 mcg by mouth daily.        [name] Kitchen DISCONTD: lisinopril (PRINIVIL,ZESTRIL) 10 MG tablet Take 10 mg by mouth daily.         No Known Allergies  Review of Systems negative except from HPI and PMH  Physical Exam Well developed and well nourished in no acute distress HENT normal E scleral and icterus clear Neck Supple JVP flat; carotids brisk and full Clear to ausculation Regular rate and rhythm, no murmurs gallops or rub Soft with active bowel sounds No clubbing cyanosis and edema Alert and oriented, grossly normal motor and sensory function Skin Warm and Dry     Assessment and  Plan

## 2011-03-31 NOTE — Assessment & Plan Note (Signed)
Stabe on current meds

## 2011-03-31 NOTE — Assessment & Plan Note (Signed)
STable on current meds;  His digoxin was terminated 2/2 photo toxicity in setting of amio

## 2011-03-31 NOTE — Assessment & Plan Note (Signed)
Amiodarone side effects ameliorated at the lower dose

## 2011-03-31 NOTE — Assessment & Plan Note (Signed)
The patient's device was interrogated.  The information was reviewed. No changes were made in the programming.    

## 2011-03-31 NOTE — Assessment & Plan Note (Signed)
Multiple brief episodes;  When apixaban is released. Will need to consider using it

## 2011-04-01 ENCOUNTER — Encounter: Payer: Self-pay | Admitting: Internal Medicine

## 2011-04-01 DIAGNOSIS — R945 Abnormal results of liver function studies: Secondary | ICD-10-CM

## 2011-04-07 ENCOUNTER — Other Ambulatory Visit: Payer: Self-pay | Admitting: Cardiovascular Disease

## 2011-04-07 MED ORDER — CARVEDILOL 6.25 MG PO TABS: 6.2500 mg | ORAL_TABLET | Freq: Two times a day (BID) | ORAL | Status: DC

## 2011-04-07 NOTE — Telephone Encounter (Signed)
Fax received from pharmacy. Corwin Levins RN

## 2011-04-18 ENCOUNTER — Other Ambulatory Visit: Payer: Self-pay | Admitting: *Deleted

## 2011-04-18 DIAGNOSIS — Z79899 Other long term (current) drug therapy: Secondary | ICD-10-CM

## 2011-04-22 ENCOUNTER — Encounter: Payer: Self-pay | Admitting: Cardiovascular Disease

## 2011-04-22 ENCOUNTER — Other Ambulatory Visit (INDEPENDENT_AMBULATORY_CARE_PROVIDER_SITE_OTHER): Payer: Medicare Other | Admitting: *Deleted

## 2011-04-22 ENCOUNTER — Ambulatory Visit (INDEPENDENT_AMBULATORY_CARE_PROVIDER_SITE_OTHER): Payer: Medicare Other | Admitting: Cardiovascular Disease

## 2011-04-22 VITALS — BP 110/70 | HR 62 | Ht 71.0 in | Wt 165.4 lb

## 2011-04-22 DIAGNOSIS — I2589 Other forms of chronic ischemic heart disease: Secondary | ICD-10-CM

## 2011-04-22 DIAGNOSIS — Z79899 Other long term (current) drug therapy: Secondary | ICD-10-CM

## 2011-04-22 DIAGNOSIS — I255 Ischemic cardiomyopathy: Secondary | ICD-10-CM

## 2011-04-22 LAB — HEPATIC FUNCTION PANEL
ALT: 34 U/L (ref 0–53)
AST: 43 U/L — ABNORMAL HIGH (ref 0–37)
Albumin: 3.6 g/dL (ref 3.5–5.2)
Alkaline Phosphatase: 38 U/L — ABNORMAL LOW (ref 39–117)

## 2011-04-22 LAB — BASIC METABOLIC PANEL
Chloride: 104 mEq/L (ref 96–112)
GFR: 50.21 mL/min — ABNORMAL LOW (ref >60.00)
Glucose, Bld: 101 mg/dL — ABNORMAL HIGH (ref 70–99)
Potassium: 5 mEq/L (ref 3.5–5.1)
Sodium: 138 mEq/L (ref 135–145)

## 2011-04-22 NOTE — Assessment & Plan Note (Signed)
And is doing quite better from a cardiac standpoint. He's tolerating his medications and these of breath beginning his strength. We have gradually been able to get him back on his medications.  We will continue with his same medications. He does not have any volume overload so I do not think that we need Lasix at this point. In fact, we have had problems with hypotension a month or so ago so don't think that the addition of Lasix would be of any benefit.

## 2011-04-22 NOTE — Progress Notes (Signed)
[name] Date of Birth  07-18-1935 [facility] [id number] [address]. [address]     [place],   [address] [phone]  Fax  [phone]  History of Present Illness:  [name] is an elderly gentleman with a history of coronary artery disease and coronary artery bypass grafting. He has had a large anterior wall myocardial infarction apparently 15 years ago cup attended by ventricular tachycardia.  He was admitted to the hospital several months ago with a ventricular tachycardia storm. He finally responded to amiodarone. He's had a long and slow recovery. He's had lots of weakness and hypotension requiring adjustment of his medications. He also had some symptoms of dig toxicity.  We ultimately stopped his to [facility] he's continued to improve.  He's walking 2-4  miles a dayAnd is overall feeling quite a bit better. He has not had any episodes of chest pain or shortness of breath. He's not had any further episodes of ventricular tachycardia.  Current Outpatient Prescriptions on File Prior to Visit  Medication Sig Dispense Refill  . amiodarone (PACERONE) 200 MG tablet Take 200 mg by mouth daily.       [name] Kitchen aspirin 81 MG tablet Take 81 mg by mouth daily.        . carvedilol (COREG) 6.25 MG tablet Take 1 tablet (6.25 mg total) by mouth 2 (two) times daily with a meal.  180 tablet  3  . gemfibrozil (LOPID) 600 MG tablet Take 600 mg by mouth 2 (two) times daily before a meal.        . levothyroxine (SYNTHROID, LEVOTHROID) 25 MCG tablet Take 1 tablet by mouth daily.      [name] Kitchen lisinopril (PRINIVIL,ZESTRIL) 5 MG tablet Take 5 mg by mouth daily.        . niacin (SLO-NIACIN) 500 MG tablet Take 2,000 mg by mouth daily.       [name] Kitchen spironolactone (ALDACTONE) 25 MG tablet Take 25 mg by mouth daily.        . Tamsulosin HCl (FLOMAX) 0.4 MG CAPS Take 1 tablet by mouth daily.      . digoxin (LANOXIN) 0.125 MG tablet Take 125 mcg by mouth daily.          No Known  Allergies  Past Medical History  Diagnosis Date  . Myocardial infarction     1995, non-STEMI January 2012  . Ischemic cardiomyopathy     status post CABG with patent vein grafts and an atretic LIMA to his ramus, January 2012  . Ventricular tachycardia     Storm-January 2012  . Hypertension   . Ventricular fibrillation   . Chronic systolic congestive heart failure   . Hyperlipidemia   . Encounter for long-term (current) use of other medications     amiodarone  . Atrial arrhythmia/a fibrillation     not on coumadin 2/2 subdural hematoma  . Subdural hematoma     on coumadin  . Automatic implantable cardiac defibrillator in situ     medtronic    Past Surgical History  Procedure Date  . Insert / replace / remove pacemaker     [place] DR [id number]  . Bilateral cranitomies for sub hematomas     History  Smoking status  . Never Smoker   Smokeless tobacco  . Not on file    History  Alcohol Use No    No family history on file.  Reviw of Systems:  Reviewed in the HPI.  All other systems are  negative.  Physical Exam: BP 110/70  Pulse 62  Ht 5\' 11"  (1.803 m)  Wt 165 lb 6.4 oz (75.025 kg)  BMI 23.07 kg/m2 The patient is alert and oriented x 3.  The mood and affect are normal.  The skin is warm and dry.  Color is normal.  The HEENT exam reveals that the sclera are nonicteric.  The mucous membranes are moist.  The carotids are 2+ without bruits.  There is no thyromegaly.  There is no JVD.  The lungs are clear.  The chest wall is non tender.  The heart exam reveals a regular rate with a normal S1 and S2.  There are no murmurs, gallops, or rubs.  The PMI is not displaced.   Abdominal exam reveals good bowel sounds.  There is no guarding or rebound.  There is no hepatosplenomegaly or tenderness.  There are no masses.  Exam of the legs reveal no clubbing, cyanosis, or edema.  The legs are without rashes.  The distal pulses are intact.  Cranial nerves II - XII are intact.  Motor  and sensory functions are intact.  The gait is normal.  Assessment / Plan:

## 2011-04-24 ENCOUNTER — Telehealth: Payer: Self-pay | Admitting: *Deleted

## 2011-04-24 NOTE — Telephone Encounter (Signed)
Message copied by Astrid Drafts on Thu Apr 24, 2011  4:13 PM ------      Message from: [place], [place]      Created: Thu Apr 24, 2011  1:08 PM       Looks [place]. Continue meds.

## 2011-04-24 NOTE — Telephone Encounter (Signed)
Patient called with lab results. Pt verbalized understanding. Corwin Levins RN

## 2011-05-02 ENCOUNTER — Telehealth: Payer: Self-pay | Admitting: Cardiovascular Disease

## 2011-05-02 MED ORDER — LISINOPRIL 5 MG PO TABS: 5.0000 mg | ORAL_TABLET | Freq: Every day | ORAL | Status: DC

## 2011-05-02 NOTE — Telephone Encounter (Signed)
PT ASKING TO SW AMY AND DID NOT SAY WHY? CHART PLACED IN BOX.

## 2011-05-02 NOTE — Telephone Encounter (Signed)
Refill to [facility] for Lisinopril 5mg  #90/3

## 2011-05-13 NOTE — Letter (Signed)
May 25, 2007    Thayer Headings, M.D.  [address] [place], [place] [address]   RE:  [name], [name]  MRN:  [id number]  /  DOB:  October 12, 1935   Dear Abbe Amsterdam:   I hope the letter finds you well.  [name] came in today.  He is  status post generator replacement in February 2008 for polymorphic  ventricular tachycardia and he is doing quite well.  He has had no  problems with recurrent chest pain or shortness of breath.  His wound is  well-healed.   His medications include Coreg 6.25, lisinopril 10, Aldactone 25, Digitek  0.125, gemfibrozil 600 b.i.d., Niacin 500 daily and aspirin 81.   On examination, his blood pressure was 103/64, pulse of 64.  Lungs were  clear.  Heart sounds were regular and the extremities were without  edema.   Interrogation of his Medtronic Maximo ICD demonstrates a P wave of 2.5  with impedance of 568, a threshold of 1 volt at 0.2.  In both chambers,  the R wave was 9.0 with an impedance of 464.  High-voltage impedances  were 37/48.  He is 6% ventricularly paced and 77% atrial-paced.  The  device was reprogrammed to decrease __________ sensing.   IMPRESSION:  1. Polymorphic ventricular tachycardia.  2. Ischemic heart disease with depressed left ventricular function,      question ejection fraction.  3. Prior implantable cardioverter-defibrillator now with recent      generator replacement.   [name] is doing terrific.  He is looking forward to going fishing.    Sincerely,      Deboraha Sprang, MD, [facility]  Electronically Signed    SCK/MedQ  DD: 05/25/2007  DT: 05/25/2007  Job #: [id number]

## 2011-05-13 NOTE — Assessment & Plan Note (Signed)
[place] OFFICE NOTE   JAGUAR, SAFKO                     MRN:          [id number]  DATE:02/09/2009                            DOB:          Oct 27, 1935    Mr. Glavin is seen in followup for polymorphic ventricular tachycardia in  the setting of ischemic heart disease and class II congestive failure.  He continues to do amazingly well.  He has no complaints of chest pain,  shortness of breath.  He has had no intercurrent arrhythmias.   His medications were reviewed and were unchanged.   On examination, his blood pressure is 115/65 with a pulse of 62.  He was  not weighed.  His lungs were clear.  Heart sounds were regular.  The  abdomen was soft.  The extremities were without edema.   Interrogation of Medtronic maximal ICD demonstrates a P-wave of 2.6 with  impedance of 504, threshold of 1 volt at 0.2, the R-wave was 7.9 with  impedance of 440, threshold of 1 volt at 0.2.  He is atrially paced 80%  of the time.  Program is in the DDDR mode.  Heart rate excursion seems  to be adequate for his tasks.  His activity level is relatively stable.  There has been a minor downward trend.   IMPRESSION:  1. Polymorphic ventricular tachycardia.  2. Ischemic heart disease with ejection fraction of about 15-20%.  3. Status post implantable cardioverter-defibrillator for the above.  4. Atrial arrhythmias with inappropriate therapy currently quiescent.  5. Subdural hematomas, previously on Coumadin, now off.   Mr. Zalar is doing very well.  We will see him again in 3 months' time  in the [facility].     Deboraha Sprang, MD, [facility]  Electronically Signed    SCK/MedQ  DD: 02/09/2009  DT: 02/09/2009  Job #: [id number]

## 2011-05-13 NOTE — Letter (Signed)
January 31, 2008    Thayer Headings, M.D.  [address], [place], [place] [address]   RE:  [name], [name]  MRN:  [id number]  /  DOB:  1935/01/12   Dear Abbe Amsterdam:   [name] comes in.  He continues to impress me as to how well he is  doing.  He has had no intercurrent problems with shortness of breath,  chest pain, or ICD discharges. He has been fishing avidly as always.   His medications include Coreg 6.25, lisinopril 10, spironolactone 10,  Digitek 0.125, Gemfibrazole, niacin and aspirin.   On examination, his blood pressure is 110/72, pulse is 68.  LUNGS:  Clear.  HEART SOUNDS:  Were regular.  EXTREMITIES:  Without edema.   Interrogation of his Medtronic Maximo ICD demonstrates a P-wave of 2, an  impedance of 480, a threshold of a volt at 0.2.  The R-wave was 8 with  impedance of 472, a threshold of 1 volt at 0.2.  He is atrial paced 80%  of the time, essentially he is in the DVDR mode.   IMPRESSION:  1. Polymorphic ventricular tachycardia.  2. Bradycardia.  3. Status post dual-chamber ICD for the above.  4. Ischemic heart disease.   [name] is doing terrific.  We will plan to see him again in one  year's time.  He will be followed up remotely in the interim.    Sincerely,      Deboraha Sprang, MD, [facility]  Electronically Signed    SCK/MedQ  DD: 01/31/2008  DT: 01/31/2008  Job #: [id number]

## 2011-05-16 NOTE — H&P (Signed)
NAMELABYRON, NOHL NO.:  [id number]   MEDICAL RECORD NO.:  [id number]          PATIENT TYPE:  INP   LOCATION:  2899                         FACILITY:  [facility]   PHYSICIAN:  Deboraha Sprang, MD, FACCDATE OF BIRTH:  [birth year]   DATE OF ADMISSION:  02/01/2007  DATE OF DISCHARGE:                              HISTORY & PHYSICAL   PHYSICIANS:  Dr. Caryl Comes is his electrophysiologist. Dr. Acie Fredrickson is his  cardiologist. Dr. Reynaldo Minium is his primary caregiver.   HISTORY OF PRESENT ILLNESS:  Mr. Cerro is a 76 year old male who  presents for cardioverter defibrillator to generator change.  He has a  history of ischemic cardiomyopathy with ejection fraction of 29% at  Myoview stress study one week ago. Study showed no ischemia, severe  global hypokinesis, akinesis of the anterior wall and apex.   He certainly does not behave as if he had a cardiomyopathy. He has lost  48 pounds since his first myocardial infarction in 1995. He walks two  miles daily and does it briskly. On Saturday, February 2, he was on the  track keeping up with a younger guy pacing himself. He felt his heart  racing but no dyspnea, no chest pain, no dizziness. He pulled off the  track to slow down and to take a drink. He received two cardioverter  defibrillator shocks at that time in rapid succession. He wonders if the  parameters for a normal tachycardia are too low.   His first myocardial infarction came in 1995. He has severe three-vessel  coronary artery disease.  He is status post coronary artery bypass graft  surgery with Dr. Cyndia Bent at that time. He had postoperative ventricular  fibrillation which required cardioversion several times. A repeat  catheterization showed no blockages in the grafts. He had an  electrophysiology study which demonstrated inducible ventricular  fibrillation. He had his first ICD implanted at that time. It was a  subpectoral insertion between the pectoralis major and  pectoralis minor.  He had a generator change in 1999. He has a Medtronic device.   He claims no dizziness. He never has chest pain. I don't get short of  breath, he says. His New York Heart Association class is class Roman I-  II chronic, systolic congestive heart failure. He has no prior history  of syncope. He does not believe that Saturday's shocks were for  ventricular tachycardia.   ALLERGIES:  No known drug allergies.   MEDICATIONS:  1. Coreg 6.25 mg twice daily.  2. Lisinopril 10 mg daily.  3. Spironolactone 25 mg daily.  4. Digitek 0.125 mg daily.  5. Gemfibrozil 600 mg twice daily.  6. Niacin 500 mg four tablets daily.  7. Aspirin 81 mg daily.   REVIEW OF SYSTEMS:  The patient is not complaining of fevers, chills or  night sweats. HEENT: No epistaxis, no hemoptysis, vertigo or  photophobia. INTEGUMENT:  No rashes or nonhealing ulcerations.  CARDIOVASCULAR:  No chest pain, no shortness of breath with exertion. No  orthopnea, no paroxysmal nocturnal dyspnea, no edema, no palpitations.  No presyncope. UROGENITAL:  Mild nocturia one time  a night. GI:  No  GERD. No history of GI bleeding. MUSCULOSKELETAL:  Minimal joint aches,  no effusions, no malformations. NEUROLOGIC:  Grossly intact.   LABORATORY DATA:  Taken January 24 white cells 7.2, hemoglobin 15.1,  hematocrit 43.7, platelets 241. Electrolytes: Sodium 142, potassium 4.4,  chloride 107, carbonate 31, BUN 13, creatinine 1.1, glucose 97. PT 14.5,  INR 1.3, PTT 30.   PHYSICAL EXAMINATION:  VITAL SIGNS:  Temperature 97.9, blood pressure is  114/75, pulse 72 and regular, respirations are 20, oxygen saturations  not obtained.  GENERAL:  Alert and oriented x3 in no acute distress.  LUNGS:  Clear to auscultation bilaterally.  HEART:  Regular rate and rhythm without murmur.  ABDOMEN:  Soft, nondistended. Bowel sounds are present.  EXTREMITIES:  Show no evidence of clubbing, cyanosis or edema.   IMPRESSION:  1. ICD  implanted for inducible ventricular tachycardia/ventricular      fibrillation.  2. ICD is at elective replacement indicator. It was placed beneath the      subpectoralis major muscle.  3. Ischemic cardiomyopathy.      a.     Myocardial infarction 1995.      b.     Three-vessel coronary artery disease status post coronary       artery bypass graft surgery.      c.     I-II chronic systolic congestive heart failure.      d.     Ejection fraction 29% on Myoview one week ago. No ischemia.  4. Weight loss of 48 pounds since 1995 when he had his heart attack.  5. Cardioverter defibrillator discharges x2 on Saturday, February 2,      when the patient was walking briskly. He is totally asymptomatic      with that.  6. Dyslipidemia.  7. Hypertension.   PLAN:  Generator change today with Dr. Caryl Comes, perhaps raise VT parameter  at that time.      Sueanne Margarita, [place]      Deboraha Sprang, MD, [facility]  Electronically Signed    GM/MEDQ  D:  02/01/2007  T:  02/01/2007  Job:  [id number]

## 2011-05-16 NOTE — Op Note (Signed)
NAMEFELIX, CORRALES NO.:  [id number]   MEDICAL RECORD NO.:  [id number]          PATIENT TYPE:  INP   LOCATION:  2899                         FACILITY:  [place]   PHYSICIAN:  Deboraha Sprang, MD, FACCDATE OF BIRTH:  March 08, 1935   DATE OF PROCEDURE:  02/01/2007  DATE OF DISCHARGE:                               OPERATIVE REPORT   PREOPERATIVE DIAGNOSIS:  Previously implanted defibrillator with prior  appropriate therapy for polymorphic ventricular tachycardia, previously  implanted defibrillator (subpectoral) now at end of life.   POSTOPERATIVE DIAGNOSIS:  Previously implanted defibrillator with prior  appropriate therapy for polymorphic ventricular tachycardia, previously  implanted defibrillator (subpectoral) now at end of life.   PROCEDURE:  Defibrillator explantation, pocket revision, insertion of a  new defibrillator and intraoperative defibrillation threshold testing.   Following obtaining informed consent, the patient was brought to the  electrophysiology laboratory and placed on the fluoroscopic table in  supine position.  After routine prep and drape of the left upper chest,  lidocaine was infiltrated along the line of the previous incision and  carried down to the layer of the prepectoral fascia using sharp  dissection with electrocautery.  At this point, the muscles were gently  separated.  I could not find the prior scar line.  We went down, and the  device pocket was then opened.  Carefully, the device was freed up from  its enveloping scar tissue, and the leads were freed up and the device  was explanted.  Interrogation of the previously implanted Medtronic [phone]  defibrillator lead implanted in December 2001 with a serial number of  [id number] demonstrated a R wave of 19.9 with impedance of 526, a  threshold 0.6 volts at 0.5 milliseconds and current threshold was 1.2  MA.  Proximal coil impedance was 51 ohms.  Distal coil impedance was 40  ohms.   The previously implanted atrial lead model 6940, serial number  [id number] demonstrated a P-wave of 5.3 with impedance of 6690, a  threshold of 0.5 volts at 0.5 milliseconds, current threshold 0.5 MA.  With these acceptable parameters recorded, the leads were attached to a  [place] ICD serial number [id number].  Prior to  implantation of the device, the pocket was revised.  The floor of the  subpectoral pocket was excised.  The scar tissue was probably a quarter-  inch thick.  Hemostasis was obtained.  The pocket was copiously  irrigated with antibiotic-containing saline solution.  Surgicel was  placed at the very caudal aspect of the pocket.  The device was placed  in the pocket and secured to the pectoralis muscle/scar tissue at the  cephalad portion of the pocket very loosely.   Through the device, the bipolar P-wave was 2.5 with impedance of 544,  threshold 1 volt at 0.2.  The R wave was 10.7, the impedance was 488,  threshold 1 volt at 0.2 and the previously recorded impedances were  taken at this time.   The pectoralis muscle was loosely opposed, and the wound was then closed  in three layers in a normal fashion.  During this procedure,  defibrillation threshold testing was undertaken while the patient was  asleep.  Ventricular fibrillation was induced via the T-wave shock after  a total duration of 6 seconds.  A 15-joule shock was delivered through a  measured resistance of 39 ohms, terminating ventricular fibrillation and  restoring an atrial-paced rhythm.  The pocket closure was completed.  The wound was washed, dried and a  benzoin and Steri-Strip dressing was applied.  Needle counts, sponge  counts and instrument counts were correct at the procedure according to  staff.  The patient tolerated the procedure without apparent  complication.      Deboraha Sprang, MD, [facility]  Electronically Signed     SCK/MEDQ  D:  02/01/2007  T:  02/01/2007  Job:   [id number]   cc:   Thayer Headings, M.D.  Burnard Bunting, M.D.  Electrophysiologic Laboratory  [facility]

## 2011-05-16 NOTE — Assessment & Plan Note (Signed)
[place] OFFICE NOTE   NAME:[name], [name]                     MRN:          [id number]  DATE:01/26/2007                            DOB:          04/23/1935    HISTORY:  [name] presents today for followup regarding surveillance  colonoscopy.  He is a pleasant 76 year old with a history of coronary  artery disease, status post coronary artery bypass graft surgery,  history of ischemic heart disease with depressed left ventricular  function, for which he had been on Coumadin.  (The Coumadin therapy  stopped last year after the patient developed a subdural hematoma,  requiring neurosurgical intervention.)  A history of ventricular  tachycardia with sinus node dysfunction, for which he has had  arterioventricular implantable cardiac defibrillator, hyperlipidemia and  adenomatous colon polyps.  He last underwent a complete colonoscopy on  January 30, 2004.  The examination revealed marked left-sided  diverticulosis.  As well, multiple diminutive colon polyps which were  removed and found to be adenomatous.  Followup in three years, if  medically fit was recommended.  The patient reports no active GI  symptoms.  No abdominal pain, change in bowel habits or bleeding.  He  reports to me that his defibrillator is scheduled to be replaced  electively on February 01, 2007.  He is interested in having a  surveillance colonoscopy.   PAST MEDICAL HISTORY:  As above.   ALLERGIES:  No known drug allergies.   CURRENT MEDICATIONS:  1. Coreg 6.25 mg b.i.d.  2. Lisinopril 10 mg daily.  3. Spironolactone 25 mg daily.  4. Digitek 0.125 mg daily.  5. Gemfibrozil 600 mg b.i.d.  6. Niacin 2 grams daily.  7. Aspirin 81 mg daily.   FAMILY HISTORY:  Negative for gastrointestinal malignancy.   SOCIAL HISTORY:  The patient is married with two children.  Lives with  his wife.  He is retired from the [employer].  Does not  smoke or use  alcohol.   REVIEW OF SYSTEMS:  Per the diagnostic evaluation form.   PHYSICAL EXAMINATION:  GENERAL:  A well-appearing male, in no acute  distress.  VITAL SIGNS:  Blood pressure 96/68, heart rate 80, weight 167 pounds.  He is 5 feet 11 inches in height.  HEENT:  Sclerae anicteric.  Conjunctivae pink.  Oral mucosa intact.  No  adenopathy.  LUNGS:  Clear.  HEART:  Regular.  ABDOMEN:  Soft, without tenderness, mass or hernia.   IMPRESSION:  1. History of adenomatous colon polyps, due for a surveillance      colonoscopy.  2. Severe left-sided diverticulosis.  3. Multiple significant medical problems, including the presence of an      implantable defibrillator.   RECOMMENDATIONS:  Schedule a colonoscopy with polypectomy as needed.  The nature of the procedure, as well as the risks, benefits and  alternatives have been reviewed.  He understood and agreed to proceed.  This will be performed some weeks after he is recovered from his  defibrillator replacement.  We will use the MiraLax prep as was done at  his last examination.  Docia Chuck. Alejandro Pastor, MD  Electronically Signed    JNP/MedQ  DD: 01/26/2007  DT: 01/26/2007  Job #: [id number]   cc:   [name], M.D.

## 2011-05-16 NOTE — Letter (Signed)
January 21, 2007    Thayer Headings, M.D.  [address], [place], [place] [address]   RE:  [name], [name]  MRN:  [id number]  /  DOB:  09/04/1935   Dear Dr. Acie Fredrickson:   [name] is seen today.  He is status post ICD implantation for VT with  polymorphic recurrence.  He has ischemic heart disease and depressed LV  function.  He is doing really well.  He has no complaints of chest pain  or shortness of breath, but it has been years since he has had an  evaluation of his perfusion and his LV function.   He has no complaints of chest pain or shortness of breath.   His medications include Coreg 6.25 mg, lisinopril 10 mg, spironolactone  25 mg, Digitek 0.125 mg, gemfibrozil, Niacin and aspirin.   He has no known drug allergies.   On examination, his blood pressure is 106/60 with a pulse of 74.  Lungs  were clear.  Heart sounds were regular.  The extremities were without  edema.   Interrogation of his Medtronic [phone] ICD demonstrates that he is now at  [facility] with a battery voltage of 4.85.  EOL voltage is 4.5 or so.  P wave  was 3.  Impedance was 521.  Threshold was 1 volt at 0.2 in both  chambers.  The R wave was 9 with impedance of 481.   IMPRESSION:  1. Ventricular tachycardia with recurrence -- polymorphic.  2. Ischemic heart disease with depressed left ventricular function, ?      ejection fraction.  3. Prior implantable cardioverter-defibrillator, now at elective      replacement indicator.   [name], I feel is now at [facility]  He needs to have his device generator  replaced.  I have reviewed the risks of this with him, which are  primarily related to infection as well as the issue potentially of  ischemia, so I was wondering if you would be able to undertake stress  testing prior to change-out so as to minimize that risk.   I look forward to talking to you more about him.    Sincerely,      Deboraha Sprang, MD, [facility]  Electronically Signed    SCK/MedQ   DD: 01/21/2007  DT: 01/21/2007  Job #: [id number]   CC:    Burnard Bunting, M.D.

## 2011-05-16 NOTE — H&P (Signed)
NAMEMUBASHIR, GERE              ACCOUNT NO.:  [id number]   MEDICAL RECORD NO.:  [id number]          PATIENT TYPE:  INP   LOCATION:  2630                         FACILITY:  [place]   PHYSICIAN:  Marchia Meiers. Vertell Limber, M.D.  DATE OF BIRTH:  [birth year]   DATE OF ADMISSION:  11/10/2005  DATE OF DISCHARGE:                                HISTORY & PHYSICAL   REASON FOR ADMISSION:  Bilateral subdural hematoma with headache.   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 He has went to the emergency room x2  yesterday and then today was initially diagnosed with sinusitis. He was  found subsequently to have subdural after a head CT was obtained.   Head CT demonstrates bilateral subdural hematoma with mass-effect, right  greater than left. He notes increase pain with cough, sneezing, or  straining. He is on Coumadin. He had myocardial infarction in 1995. He had  pacemaker and defibrillator, status post CABG. Dr. Acie Fredrickson is his  cardiologist. Dr. Reynaldo Minium is primary physician.   PAST MEDICAL HISTORY:  As above.   MEDICATIONS:  Coumadin, Lisinopril, Atenolol, gemfibrozil, Inspra, Digitek,  and azithromycin.   ALLERGIES:  No known drug allergies.   PHYSICAL EXAMINATION:  His temperature is 98.5, pulse of 75, respirations  17, blood pressure of 130/60. He is awake, alert, and fully oriented. His  neck is without bruits. His heart has regular rate and rhythm without  murmur. Chest is clear to auscultation. Abdomen is soft and nontender.  Active bowel sounds. Extremities are without edema, clubbing, or cyanosis.  On neurologic examination, he is awake, alert, and oriented. He speaks with  clear and fluent speech. His pupils are equal, round, and reactive to light.  Extraocular movements are intact. Face is symmetric. Hearing is intact. He  moves all extremities  with full strength except for hand intrinsic weakness  at 4/5 on the right. He has mild right pronator drift. Sensory examination  is intact. Reflexes are 2 in the biceps, triceps, and brachial radialis; 2  at the knees and 2 at the ankles. Great toe is upgoing on the right and  downgoing on the left. Cerebellar examination is intact.   IMPRESSION:  Edwen Rocchio is a 76 year old man with bilateral subdural  hematomas. He is on Coumadin which needs to be reversed. I talked to Dr.  Wynonia Lawman from cardiology. He is going to get in touch with Dr. Acie Fredrickson for him  to be evaluated in the morning. The specific issues to be raised are  clearance for surgical intervention from a cardiology standpoint and also  the need for anticoagulation versus the need for him to be off of  anticoagulation given his subdural hematoma. He will likely require drainage  of his subdural.     Marchia Meiers. Vertell Limber, M.D.  Electronically Signed    JDS/MEDQ  D:  11/10/2005  T:  11/10/2005  Job:  [id number]

## 2011-05-16 NOTE — Discharge Summary (Signed)
[name], [name]              ACCOUNT NO.:  [id number]   MEDICAL RECORD NO.:  [id number]          PATIENT TYPE:  INP   LOCATION:  2630                         FACILITY:  [facility]   PHYSICIAN:  Marchia Meiers. Vertell Limber, M.D.  DATE OF BIRTH:  24-Nov-1935   DATE OF ADMISSION:  11/10/2005  DATE OF DISCHARGE:  11/18/2005                                 DISCHARGE SUMMARY   REASON FOR ADMISSION:  Subdural hemorrhage.  Additional diagnoses of  congestive heart failure status post atherosclerosis of coronary arteries  with aortocoronary bypass, old myocardial infarction and hyperlipidemia.   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  He has bilateral subdural hematomas, right greater than left.  He  has increased pain with cough, sneeze or straining.  He is on Coumadin.  Dr.  Acie Fredrickson is his cardiologist.  The patient in addition is on Coumadin,  lisinopril, atenolol, gemfibrozil, Inspra, Digitek and __________. The  patient was taken off Coumadin and was found to have right hand intrinsic  weakness and right-sided drift.  He was seen by Dr. Acie Fredrickson from cardiology  and it was elected to not anticoagulate him further.  The patient underwent  bilateral craniotomies for subdural hematoma on November 11, 2005 and with  his drains placed, postoperatively he was doing well.  Was gradually  mobilized and was having less headache.  He was observed in the ICU and  drains were continued and there was still quite bloody drainage.  However,  they were then discontinued.  The right-sided drain was discontinued on the  20th and the left-sided drain was discontinued on the 19th.  The patient was  discharged home on the 21st with instructions to follow up with me in my  office as an outpatient.   FINAL DIAGNOSES:  1.  Subdural hemorrhage.  2.  Additional diagnoses of congestive heart failure status post     atherosclerosis of coronary arteries with aortocoronary bypass.  3.  Old myocardial infarction and hyperlipidemia.      Marchia Meiers. Vertell Limber, M.D.  Electronically Signed     JDS/MEDQ  D:  01/23/2006  T:  01/24/2006  Job:  [id number]

## 2011-05-16 NOTE — Assessment & Plan Note (Signed)
[facility] OFFICE NOTE   NAME:[name], [name]                     MRN:          [id number]  DATE:02/15/2007                            DOB:          03-28-1935    [name] was seen today in the clinic on February 15, 2007, for a wound  check of his newly implanted Medtronic model number [id number].  Date  of implant was February 01, 2007, for polymorphic VT.  On interrogation  of his device today, his battery voltage was 3.20 with a charge time of  6.09 seconds.  P wave measured 2.4 millivolts with an atrial capture  threshold of 1 volt at 0.2 milliseconds and an atrial lead impedance of  520 ohms.  R waves measured 9.5 millivolts with a ventricular pacing  threshold of 1 volt at 0.3 milliseconds and a ventricular lead impedance  of 548 ohms.  Shock impedance was 47.  There was one nonsustained  episode since implant date.  I did lower his atrial output to 2 volts at  0.4 milliseconds and his RV output to 2.5 volts at 0.4 milliseconds.  He  does have chronic leads in, this was just an upgrade on the device.  Steri-Strips had been removed by the patient, wound is without redness  or edema.  Also at the patient's request, when he was previously set at  DDDR at 70 and 120 and this device was set at 87 and 120 and he was  requesting to be put up to 70 again which I did.  He is going to come  back and see [place] in 3 months' time.      Alma Friendly, LPN  Electronically Signed      Deboraha Sprang, MD, [facility]  Electronically Signed   PO/MedQ  DD: 02/15/2007  DT: 02/15/2007  Job #: [id number]

## 2011-05-16 NOTE — Assessment & Plan Note (Signed)
[name]   [name], [name]                     MRN:          [id number]  DATE:09/08/2006                            DOB:          1935-09-27    [name] is seen.  He continues to do quite well.  He is still fishing  vigorously.   His current medications are reviewed and are notable for the absence of his  Coumadin, stopped because of subdural hematomas.   PHYSICAL EXAMINATION:  VITAL SIGNS:  His blood pressure was 115/68.  His  pulse was 76.  LUNGS:  Clear.  CARDIAC:  Heart sounds were regular with an early systolic murmur.  EXTREMITIES:  Without edema.   Interrogation with Medtronic Gem [id number] ICD demonstrates a P wave of 4 with an  impedance of 521, a threshold of 1 volt at 0.2.  The R wave was 10 with an  impedance of 481 and a threshold of 1 volt at 0.2.  __________  volt was  5.05.   [name] is approaching ERI, but he has continued to do well.  His device  was initially implanted in 1995; it was then complicated by a lead fracture,  which prompted removal by Dr. Rosita Fire over at [facility].  He then underwent  device generator reimplantation by Dr. Cyndia Bent in 1999 and I cannot tell from  Dr. Vivi Martens Mora whether it was a subcutaneous or subpectoral implant.   In any case, we will see him again in 4 months' time.                                   Deboraha Sprang, MD, [facility]   SCK/MedQ  DD:  09/08/2006  DT:  09/09/2006  Job #:  [id number]   cc:   Thayer Headings, M.D.  Burnard Bunting, M.D.

## 2011-05-16 NOTE — Op Note (Signed)
NAMEZENDE, REICHARDT              ACCOUNT NO.:  [id number]   MEDICAL RECORD NO.:  [id number]          PATIENT TYPE:  INP   LOCATION:  2630                         FACILITY:  [facility]   PHYSICIAN:  Marchia Meiers. Vertell Limber, M.D.  DATE OF BIRTH:  04/03/1935   DATE OF PROCEDURE:  11/11/2005  DATE OF DISCHARGE:                                 OPERATIVE REPORT   PREOPERATIVE DIAGNOSIS:  Bilateral subdural hematomas, acute on chronic.   POSTOPERATIVE DIAGNOSIS:  Bilateral subdural hematomas, acute on chronic.   OPERATION PERFORMED:  Bilateral craniotomies for subdural hematomas.   SURGEON:  Marchia Meiers. Vertell Limber, M.D.   ANESTHESIA:  General endotracheal.   ESTIMATED BLOOD LOSS:  Approximately 100 mL.   COMPLICATIONS:  None.   DISPOSITION:  Recovery.   INDICATIONS FOR PROCEDURE:  Avyay Nakayama is a 76 year old man who is on  Coumadin with heart disease who developed bilateral subdural hematomas with  significant mass effect.  He was admitted to the intensive care unit.  His  coags were corrected with fresh frozen plasma and vitamin K and he was taken  to surgery today for craniotomies for subdural hematomas.  There appeared to  be multiple loculated fluid collections with multiple ages of clot.   DESCRIPTION OF PROCEDURE:  Mr. Pavese was brought to the operating room.  Following satisfactory and uncomplicated induction of general endotracheal  anesthesia and placement of intravenous line and a Foley catheter, he was  placed in supine position on the operating table with the brow up.  His  bifrontotemporal regions were then shaved prepped and draped in the usual  sterile fashion.  After infiltrating area of planned incision with 0.25%  Marcaine and 0.5% lidocaine with epinephrine 1:200,000, incisions were made  approximately three inches long along the superotemporal line, carried  through temporalis muscle, fascia and muscle to the skull.  A trephine was  produced with acorn bit and then a small  bone flap was elevated bilaterally.  The dura was tacked up and incised.  The dura was quite thickened.  There  were multiple layers of various age clot with multiple membranes and these  were opened to get access to the brain.  Crank oil consistency fluid was  then released.  Both on irrigating on the right side and vice versa, the  irrigant emptied out the opposite site suggesting that there was good  communication between the cavities.  Subsequently, two #7 Jackson-Pratt  drains were placed and the bone flaps were replaced with Osteomed plating  system.  Fascia was closed with 2-0 Vicryl sutures.  The galea was  reapproximated with 2-0 Vicryl interrupted inverted sutures and the skin  edges were reapproximated with running 3-0 nylon locked stitch.  The wound was dressed with bacitracin Telfa and OpSite.  The patient was  extubated in operating room and taken to recovery room in stable and  satisfactory condition having tolerated the procedure well.  All counts  correct end of case.      Marchia Meiers. Vertell Limber, M.D.  Electronically Signed     JDS/MEDQ  D:  11/11/2005  T:  11/12/2005  Job:  [id number]

## 2011-05-16 NOTE — Discharge Summary (Signed)
NAMENEWLAND, [name] NO.:  [id number]   MEDICAL RECORD NO.:  [id number]          PATIENT TYPE:  INP   LOCATION:  2899                         FACILITY:  [facility]   PHYSICIAN:  Sueanne Margarita, PA   DATE OF BIRTH:  [birth year]   DATE OF ADMISSION:  02/01/2007  DATE OF DISCHARGE:  02/01/2007                               DISCHARGE SUMMARY   ALLERGIES:  THIS PATIENT HAS NO KNOWN DRUG ALLERGIES.   PRINCIPAL DIAGNOSES:  1. Cardioverted defibrillator and elective replacement indicator.      Device was placed beneath the pectoralis major.  2. Cardioverted defibrillator shocks x2, Saturday January 30, 2007      while patient was walking on track.  Interrogation of device shows      sinus tachycardia.  ICD clinic January 21, 2007 of the ventricular      tachycardia limit is 150 beats per minute.  A ventricular      fibrillation zone is 200 beats per minute.   SECONDARY DIAGNOSES:  1. Ischemic cardiomyopathy.      a.     Myocardial infarction 1995.      b.     Three vessel coronary artery disease status post coronary       artery bypass graft surgery.      c.     Class I-II chronic, systolic congestive heart failure.      d.     Ejection fracture of 25% in Myoview study one week ago.  No       ischemia.  2. Weight loss, 48 pounds since 1995.  3. Dyslipidemia.  4. Hypertension.   PROCEDURE:  February 01, 2007 explantation of existing Medtronic  cardioverted defibrillator implanted in 1999 with implant of Medtronic  [facility] VR dual chamber cardioverted defibrillator by Dr. Virl Axe.   BRIEF HISTORY:  Mr. [name] is a 76 year old male, he presents for  cardioverted defibrillator  generator change.  He has a history of  ischemic cardiomyopathy with ejection fraction of 29% and Myoview stress  study one week ago.  Study showed no ischemia, severe global  hypokinesis.  There is akinesis of the anterior wall and apex.   He does not BEHAVE as if he has a cardiomyopathy.   He has lost 48 pounds  since his first myocardial infarction in 1995.  He walks two miles daily  and briskly.  On Saturday January 30, 2007, he was on a track keeping  up with a younger guy.  He felt his heart racing but no dyspnea, no  chest pain, no dizziness.  He pulled off the track to slow down, take a  drink, and received two shocks at that time.  He wonders if the  parameters for sensing ventricular tachycardia are too low.   His myocardial infarction occurred in 1995.  He has three vessel  coronary artery disease and is status post coronary artery bypass graft  surgery.  He had postoperative ventricular fibrillation which required  cardioverting several times to achieve a stable sinus rhythm.  Repeat  catheterization at that time showed no blockages in his grafts.  He had  an electrophysiology study which showed inducible ventricular  fibrillation.  He had his first cardioverted defibrillator implanted  then in 1995.  He had a subpectoral insertion between the pectoralis  major and pectoralis minor.  He had a subsequent generator change in  1999.  He has a Medtronic device. Patient is claiming no dizziness,  never had chest pain, I don't get short of breath.  His New [place] Heart  Association Class is Class I-II chronic systolic congestive heart  failure.  He has no prior history of syncope.  He does not believe the  Saturday shocks were for V tach.  Plan will be to admit the patient for  a short stay and to change the generator after interrogating the device.   HOSPITAL COURSE:  The patient presented February 01, 2007 for generator  change.  This device was at elective replacement indicator.  During  history and physical, the patient related two shocks on Saturday while  walking briskly on a track.  The device was interrogated showing sinus  tachycardia.  The patient admits that he was walking somewhat beyond his  normal brisk pace and discussed with Dr. Caryl Comes, agreed to walk in a  more  moderate pace in the future.  Of note, he has had antitachycardia pacing  for V tach successfully accomplished in the past with a VT zone of 150.  I believe that we were not particularly willing to raise that VT zone in  light of that.  Patient has done well after generator change and we will  discharge the same day, February 01, 2007.   He will be discharged on his regular medications which are:  1. Coreg 6.25 mg twice daily.  2. Lisinopril 10 mg daily.  3. Spironolactone 25 mg daily.  4. Digitek 0.125 mg daily.  5. Gemfibrozil 600 mg twice a day.  6. Niacin 500 mg four tabs daily.  7. Enteric coated aspirin 81 mg daily.   He is to follow up with [facility] on  Monday February 15, 2007 at 9:00 for an incision check.  In the morning  of February 02, 2007, he is to remove the bandage and leave the incision  open to the air.  He is to sponge bathe and keep incision dry until  Monday February 08, 2007.   Laboratory studies pertinent to this admission:  Complete blood count:  White cell 7.2, hemoglobin 15.1, hematocrit 43.7, platelets 241.  Protime 14.5, INR 1.3, PTT is 30, sodium was 142, potassium 4.4,  chloride 107, carbonate 31, glucose is 97, BUN 13, creatinine 1.1.      Sueanne Margarita, PA     GM/MEDQ  D:  02/01/2007  T:  02/02/2007  Job:  [id number]   cc:   Deboraha Sprang, MD, [facility]  Thayer Headings, M.D.  Burnard Bunting, M.D.

## 2011-05-20 ENCOUNTER — Ambulatory Visit: Payer: Federal, State, Local not specified - PPO | Admitting: Cardiovascular Disease

## 2011-07-03 ENCOUNTER — Ambulatory Visit (INDEPENDENT_AMBULATORY_CARE_PROVIDER_SITE_OTHER): Payer: Medicare Other | Admitting: *Deleted

## 2011-07-03 DIAGNOSIS — I499 Cardiac arrhythmia, unspecified: Secondary | ICD-10-CM

## 2011-07-03 DIAGNOSIS — I498 Other specified cardiac arrhythmias: Secondary | ICD-10-CM

## 2011-07-03 DIAGNOSIS — Z95810 Presence of automatic (implantable) cardiac defibrillator: Secondary | ICD-10-CM

## 2011-07-03 DIAGNOSIS — I4901 Ventricular fibrillation: Secondary | ICD-10-CM

## 2011-07-03 DIAGNOSIS — I472 Ventricular tachycardia, unspecified: Secondary | ICD-10-CM

## 2011-07-09 ENCOUNTER — Encounter: Payer: Self-pay | Admitting: *Deleted

## 2011-07-11 NOTE — Progress Notes (Signed)
icd remote check  

## 2011-07-15 ENCOUNTER — Ambulatory Visit: Payer: Medicare Other | Admitting: Cardiovascular Disease

## 2011-07-24 ENCOUNTER — Ambulatory Visit (INDEPENDENT_AMBULATORY_CARE_PROVIDER_SITE_OTHER): Payer: Medicare Other | Admitting: Cardiovascular Disease

## 2011-07-24 ENCOUNTER — Encounter: Payer: Self-pay | Admitting: Cardiovascular Disease

## 2011-07-24 VITALS — BP 110/66 | HR 64 | Ht 71.0 in | Wt 162.6 lb

## 2011-07-24 DIAGNOSIS — Z79899 Other long term (current) drug therapy: Secondary | ICD-10-CM

## 2011-07-24 DIAGNOSIS — I2510 Atherosclerotic heart disease of native coronary artery without angina pectoris: Secondary | ICD-10-CM

## 2011-07-24 DIAGNOSIS — I255 Ischemic cardiomyopathy: Secondary | ICD-10-CM

## 2011-07-24 DIAGNOSIS — I472 Ventricular tachycardia: Secondary | ICD-10-CM

## 2011-07-24 DIAGNOSIS — I2589 Other forms of chronic ischemic heart disease: Secondary | ICD-10-CM

## 2011-07-24 NOTE — Assessment & Plan Note (Signed)
It is doing very well from a cardiac standpoint. I am pleased that he is not having any further episodes of VT. We'll continue with the same medications.  He'll have his ICD followed in device clinic.

## 2011-07-24 NOTE — Progress Notes (Signed)
[name] Date of Birth  [birth year] [facility] / [facility] [id number] [address].     [place], [place]  [address] [phone]  Fax  [phone]  History of Present Illness:  [name] is a 76 year old gentleman with a history of coronary artery disease. He is status post anterior wall myocardial infarction with subsequent coronary artery bypass grafting in 1995. He has had congestive heart failure. He has had an ICD placed. He recently presented to the hospital with an episode of ventricular tachycardia storm. The VT was well controlled on amiodarone.  He is now getting back to his normal activities. He has been able to get down to the beach. He is now rocking between 2 and 4 miles every day. He does water aerobics on a daily basis. He's not having episodes of chest pain or shortness of breath.  Current Outpatient Prescriptions on File Prior to Visit  Medication Sig Dispense Refill  . amiodarone (PACERONE) 200 MG tablet Take 200 mg by mouth daily.       [name] Kitchen aspirin 81 MG tablet Take 81 mg by mouth daily.        . carvedilol (COREG) 6.25 MG tablet Take 1 tablet (6.25 mg total) by mouth 2 (two) times daily with a meal.  180 tablet  3  . digoxin (LANOXIN) 0.125 MG tablet Take 125 mcg by mouth daily.        [name] Kitchen gemfibrozil (LOPID) 600 MG tablet Take 600 mg by mouth 2 (two) times daily before a meal.        . levothyroxine (SYNTHROID, LEVOTHROID) 25 MCG tablet Take 1 tablet by mouth daily.      [name] Kitchen lisinopril (PRINIVIL,ZESTRIL) 5 MG tablet Take 1 tablet (5 mg total) by mouth daily.  90 tablet  3  . niacin (SLO-NIACIN) 500 MG tablet Take 2,000 mg by mouth daily.       [name] Kitchen spironolactone (ALDACTONE) 25 MG tablet Take 25 mg by mouth daily.        . Tamsulosin HCl (FLOMAX) 0.4 MG CAPS Take 1 tablet by mouth daily.        No Known Allergies  Past Medical History  Diagnosis Date  . Myocardial infarction     1995, non-STEMI January 2012  . Ischemic cardiomyopathy    status post CABG with patent vein grafts and an atretic LIMA to his ramus, January 2012  . Ventricular tachycardia      VT Storm-January 2012- responded to Amiodarone  . Hypertension   . Ventricular fibrillation   . Chronic systolic congestive heart failure   . Hyperlipidemia   . Encounter for long-term (current) use of other medications     amiodarone  . Atrial arrhythmia/a fibrillation     not on coumadin 2/2 subdural hematoma  . Subdural hematoma     on coumadin  . Automatic implantable cardiac defibrillator in situ     medtronic    Past Surgical History  Procedure Date  . Insert / replace / remove pacemaker     [place] DR [id number]  . Bilateral cranitomies for sub hematomas     History  Smoking status  . Never Smoker   Smokeless tobacco  . Not on file    History  Alcohol Use No    No family history on file.  Reviw of Systems:  Reviewed in the HPI.  All other systems are negative.  Physical Exam: BP 110/66  Pulse 64  Ht 5\' 11"  (1.803 m)  Wt 162 lb 9.6 oz (73.755 kg)  BMI 22.68 kg/m2 The patient is alert and oriented x 3.  The mood and affect are normal.   Skin: warm and dry.  Color is normal.    HEENT:   the sclera are nonicteric.  The mucous membranes are moist.  The carotids are 2+ without bruits.  There is no thyromegaly.  There is no JVD.    Lungs: clear.  The chest wall is non tender.    Heart: regular rate with a normal S1 and S2.  There are no murmurs, gallops, or rubs. The PMI is not displaced.     Abdomen: good bowel sounds.  There is no guarding or rebound.  There is no hepatosplenomegaly or tenderness.  There are no masses.   Extremities:  no clubbing, cyanosis, or edema.  The legs are without rashes.  The distal pulses are intact.   Neuro:  Cranial nerves II - XII are intact.  Motor and sensory functions are intact.    The gait is normal.   Assessment / Plan:

## 2011-07-24 NOTE — Assessment & Plan Note (Signed)
He is doing well from a congestive heart failure standpoint. He remains very active and is walking everyday.

## 2011-08-26 ENCOUNTER — Other Ambulatory Visit: Payer: Self-pay | Admitting: Surgery

## 2011-10-01 ENCOUNTER — Other Ambulatory Visit: Payer: Self-pay | Admitting: Cardiovascular Disease

## 2011-10-02 ENCOUNTER — Encounter: Payer: Medicare Other | Admitting: *Deleted

## 2011-10-02 ENCOUNTER — Telehealth: Payer: Self-pay | Admitting: Cardiovascular Disease

## 2011-10-02 ENCOUNTER — Other Ambulatory Visit: Payer: Self-pay | Admitting: Cardiovascular Disease

## 2011-10-02 DIAGNOSIS — I509 Heart failure, unspecified: Secondary | ICD-10-CM

## 2011-10-02 MED ORDER — SPIRONOLACTONE 25 MG PO TABS: 25.0000 mg | ORAL_TABLET | Freq: Every day | ORAL | Status: DC

## 2011-10-02 NOTE — Telephone Encounter (Signed)
Pt only has 2 pills left said [facility] faxed [place] 5 days ago and hasn't heard back from [place], needs asap

## 2011-10-02 NOTE — Telephone Encounter (Signed)
Pt called and needs refill on Spirinolactone 25 , # 90

## 2011-10-06 ENCOUNTER — Encounter: Payer: Self-pay | Admitting: *Deleted

## 2011-10-07 ENCOUNTER — Encounter: Payer: Self-pay | Admitting: Internal Medicine

## 2011-10-15 ENCOUNTER — Encounter: Payer: Self-pay | Admitting: Internal Medicine

## 2011-10-16 ENCOUNTER — Encounter: Payer: Self-pay | Admitting: Internal Medicine

## 2011-10-31 ENCOUNTER — Encounter: Payer: Self-pay | Admitting: Internal Medicine

## 2011-10-31 ENCOUNTER — Telehealth: Payer: Self-pay | Admitting: *Deleted

## 2011-10-31 ENCOUNTER — Ambulatory Visit (INDEPENDENT_AMBULATORY_CARE_PROVIDER_SITE_OTHER): Payer: Medicare Other | Admitting: Internal Medicine

## 2011-10-31 DIAGNOSIS — I255 Ischemic cardiomyopathy: Secondary | ICD-10-CM

## 2011-10-31 DIAGNOSIS — Z95810 Presence of automatic (implantable) cardiac defibrillator: Secondary | ICD-10-CM

## 2011-10-31 DIAGNOSIS — I499 Cardiac arrhythmia, unspecified: Secondary | ICD-10-CM

## 2011-10-31 DIAGNOSIS — I498 Other specified cardiac arrhythmias: Secondary | ICD-10-CM

## 2011-10-31 DIAGNOSIS — R7989 Other specified abnormal findings of blood chemistry: Secondary | ICD-10-CM

## 2011-10-31 DIAGNOSIS — I472 Ventricular tachycardia: Secondary | ICD-10-CM

## 2011-10-31 DIAGNOSIS — I509 Heart failure, unspecified: Secondary | ICD-10-CM

## 2011-10-31 DIAGNOSIS — I5022 Chronic systolic (congestive) heart failure: Secondary | ICD-10-CM

## 2011-10-31 DIAGNOSIS — I2589 Other forms of chronic ischemic heart disease: Secondary | ICD-10-CM

## 2011-10-31 LAB — ICD DEVICE OBSERVATION
AL AMPLITUDE: 1.9 mv
AL IMPEDENCE ICD: 528 Ohm
CHARGE TIME: 8.46 s
DEV-0020ICD: NEGATIVE
MODE SWITCH EPISODES: 675
PACEART VT: 0
RV LEAD AMPLITUDE: 9.5 mv
TZAT-0004SLOWVT: 8
TZAT-0004SLOWVT: 8
TZAT-0011FASTVT: 10 ms
TZAT-0011FASTVT: 10 ms
TZAT-0011SLOWVT: 10 ms
TZAT-0011SLOWVT: 10 ms
TZAT-0012FASTVT: 200 ms
TZAT-0012FASTVT: 200 ms
TZAT-0012SLOWVT: 200 ms
TZAT-0013FASTVT: 2
TZAT-0018FASTVT: NEGATIVE
TZAT-0018FASTVT: NEGATIVE
TZAT-0019FASTVT: 8 V
TZAT-0019FASTVT: 8 V
TZAT-0019SLOWVT: 8 V
TZAT-0019SLOWVT: 8 V
TZAT-0020FASTVT: 1.6 ms
TZAT-0020FASTVT: 1.6 ms
TZAT-0020SLOWVT: 1.6 ms
TZAT-0020SLOWVT: 1.6 ms
TZON-0003FASTVT: 230 ms
TZON-0003SLOWVT: 400 ms
TZON-0008FASTVT: 0 ms
TZON-0008SLOWVT: 0 ms
TZON-0010AFLUTTER: 70 ms
TZON-0010FASTVT: 70 ms
TZON-0010VSLOWVT: 70 ms
TZST-0001FASTVT: 4
TZST-0001FASTVT: 5
TZST-0001SLOWVT: 5
TZST-0003FASTVT: 35 J
TZST-0003FASTVT: 35 J
TZST-0003SLOWVT: 25 J
TZST-0003SLOWVT: 35 J
VENTRICULAR PACING ICD: 32 pct
VF: 0

## 2011-10-31 LAB — REMOTE ICD DEVICE
AL AMPLITUDE: 1.4 mv
AL IMPEDENCE ICD: 488 Ohm
BATTERY VOLTAGE: 3 V
CHARGE TIME: 8.12 s
RV LEAD IMPEDENCE ICD: 456 Ohm
TOT-0006: 20120402000000
TZAT-0004FASTVT: 8
TZAT-0004FASTVT: 8
TZAT-0005FASTVT: 91 pct
TZAT-0011FASTVT: 10 ms
TZAT-0011FASTVT: 10 ms
TZAT-0012FASTVT: 200 ms
TZAT-0012FASTVT: 200 ms
TZAT-0012SLOWVT: 200 ms
TZAT-0012SLOWVT: 200 ms
TZAT-0013SLOWVT: 3
TZAT-0013SLOWVT: 3
TZAT-0018SLOWVT: NEGATIVE
TZAT-0018SLOWVT: NEGATIVE
TZAT-0019SLOWVT: 8 V
TZAT-0019SLOWVT: 8 V
TZAT-0020SLOWVT: 1.6 ms
TZAT-0020SLOWVT: 1.6 ms
TZON-0003FASTVT: 230 ms
TZON-0003SLOWVT: 400 ms
TZON-0008SLOWVT: 0 ms
TZON-0010SLOWVT: 70 ms
TZST-0001FASTVT: 4
TZST-0001FASTVT: 5
TZST-0001SLOWVT: 5
TZST-0003FASTVT: 35 J
TZST-0003FASTVT: 35 J
TZST-0003SLOWVT: 35 J
TZST-0003SLOWVT: 35 J

## 2011-10-31 NOTE — Assessment & Plan Note (Signed)
Will recheck his labs--will call [facility]

## 2011-10-31 NOTE — Patient Instructions (Addendum)
Your physician recommends that you schedule a follow-up appointment in: 2 months with Dr. Acie Fredrickson.  Remote monitoring is used to monitor your Pacemaker of ICD from home. This monitoring reduces the number of office visits required to check your device to one time per year. It allows [place] to keep an eye on the functioning of your device to ensure it is working properly. You are scheduled for a device check from home on 01/29/12. You may send your transmission at any time that day. If you have a wireless device, the transmission will be sent automatically. After your physician reviews your transmission, you will receive a postcard with your next transmission date.  Your physician wants you to follow-up in: 8 months with Dr. Caryl Comes. You will receive a reminder letter in the mail two months in advance. If you don't receive a letter, please call our office to schedule the follow-up appointment.  Your physician recommends that you continue on your current medications as directed. Please refer to the Current Medication list given to you today. [name] Kitchen

## 2011-10-31 NOTE — Assessment & Plan Note (Signed)
Stable on current medications 

## 2011-10-31 NOTE — Progress Notes (Signed)
HPI  [name] is a 76 y.o. male Seen in followup for VT in the setting of ischemic cardiomyopathy with prior myocardial infarction, prior bypass surgery in 1995 with recentventricular tachycardia storm and non-STEMI.  He has a history of with recent catheterization (January 2012) demonstrated patent vein grafts with an atretic LIMA to his ramus. Ejection fraction was 15-20%.  He was treated with amiodarone complicated at high dose by nausea and jitteriness. These symptoms have largely abated.  Patient denies symptoms of GI intolerance, sun sensitivity, neurological symptoms attributable to amiodarone.  He has a history of reported cardiac arrest and is status post ICD implantation.   The patient denies chest pain, shortness of breath, nocturnal dyspnea, orthopnea or peripheral edema. There have been no palpitations, lightheadedness or syncope.      Past Medical History  Diagnosis Date  . Myocardial infarction     1995, non-STEMI January 2012  . Ischemic cardiomyopathy     status post CABG with patent vein grafts and an atretic LIMA to his ramus, January 2012  . Ventricular tachycardia      VT Storm-January 2012- responded to Amiodarone  . Hypertension   . Ventricular fibrillation   . Chronic systolic congestive heart failure   . Hyperlipidemia   . Encounter for long-term (current) use of other medications     amiodarone  . Atrial arrhythmia/a fibrillation     not on coumadin 2/2 subdural hematoma  . Subdural hematoma     on coumadin  . Automatic implantable cardiac defibrillator in situ     medtronic    Past Surgical History  Procedure Date  . Insert / replace / remove pacemaker     [place] DR [id number]  . Bilateral cranitomies for sub hematomas     Current Outpatient Prescriptions  Medication Sig Dispense Refill  . amiodarone (PACERONE) 200 MG tablet Take 200 mg by mouth daily.       [name] Kitchen aspirin 81 MG tablet Take 81 mg by mouth daily.        . carvedilol  (COREG) 6.25 MG tablet Take 1 tablet (6.25 mg total) by mouth 2 (two) times daily with a meal.  180 tablet  3  . gemfibrozil (LOPID) 600 MG tablet Take 600 mg by mouth 2 (two) times daily before a meal.        . levothyroxine (SYNTHROID, LEVOTHROID) 25 MCG tablet Take 1 tablet by mouth daily.      [name] Kitchen lisinopril (PRINIVIL,ZESTRIL) 5 MG tablet Take 1 tablet (5 mg total) by mouth daily.  90 tablet  3  . niacin (SLO-NIACIN) 500 MG tablet Take 2,000 mg by mouth daily.       . nitroGLYCERIN (NITROSTAT) 0.4 MG SL tablet Place 0.4 mg under the tongue every 5 (five) minutes as needed.        [name] Kitchen spironolactone (ALDACTONE) 25 MG tablet Take 1 tablet (25 mg total) by mouth daily.  90 tablet  3  . Tamsulosin HCl (FLOMAX) 0.4 MG CAPS Take 1 tablet by mouth daily.        No Known Allergies  Review of Systems negative except from HPI and PMH  Physical Exam Well developed and well nourished in no acute distress HENT normal E scleral and icterus clear Neck Supple JVP flat; carotids brisk and full Clear to ausculation Regular rate and rhythm, no murmurs gallops or rub Soft with active bowel sounds No clubbing cyanosis and edema Alert and oriented, grossly normal motor and sensory function Skin  Warm and Dry    Assessment and  Plan

## 2011-10-31 NOTE — Assessment & Plan Note (Signed)
Scant afib

## 2011-10-31 NOTE — Progress Notes (Signed)
Transaminases are now over 100  Will discusswith Dr Reynaldo Minium

## 2011-10-31 NOTE — Assessment & Plan Note (Addendum)
No recurrent VT or Non sustained. If stays quiet would decrease amio to 200/100 at next visit  Surveillance laboratories were probably done last month by his PCP will review

## 2011-10-31 NOTE — Assessment & Plan Note (Signed)
Stable on current meds 

## 2011-10-31 NOTE — Telephone Encounter (Signed)
I spoke with the patient per Dr. Olin Pia request. We received results from the patient's most recent lab test from Dr. Jacquiline Doe office. His AST has gone from 43 (04/22/11) to 99 (10/07/11) & his ALT has gone from 34 (04/22/11) to 107 (10/07/11). His TSH is 4.21. Per Dr. Caryl Comes, he tried to call Dr. Reynaldo Minium to discuss & has left a message to call. He thinks the amiodarone is driving the elevation in the LFT's. Due to the patient having VT storm, there is concern about adjusting the patient's amiodarone dose. Dr. Caryl Comes will review with Dr. Reynaldo Minium if anything else can be adjusted to try to help improve LFT's. We will contact the patient back after the doctor's talk. The patient is agreeable. He states that Dr. Reynaldo Minium did just readjust his synthroid.

## 2011-10-31 NOTE — Assessment & Plan Note (Signed)
The patient's device was interrogated.  The information was reviewed. No changes were made in the programming.    

## 2011-11-06 NOTE — Telephone Encounter (Signed)
Pt wants to talk to you about this

## 2011-11-07 NOTE — Telephone Encounter (Signed)
I spoke with the patient and made him aware that Dr. Caryl Comes was in agreement with Dr. Jacquiline Doe recommendations to stop niacin and gemfibrozil. He states he is due to have his LFT's rechecked again in about 4 weeks. He states he will be out of town then, but will be back within the next 2 weeks after that. I explained that should be fine. He wanted to know if his lipids would be affected due to the med change. I explained this could be a possibility. I advised that when he calls to make his lab appointment, to check and see if his lipids needs to be rechecked to see where they are at. He voices understanding.

## 2011-11-07 NOTE — Telephone Encounter (Signed)
This would be the right decision. In maintaining of the amiodarone is the higher priority. We will await repeat LFTs from Dr. Reynaldo Minium.

## 2011-11-07 NOTE — Telephone Encounter (Signed)
I spoke with the patient. I explained I had not been made aware by Dr. Caryl Comes if he and Dr. Reynaldo Minium had spoken. I will review with Dr. Caryl Comes today and call the patient back. The patient is agreeable.

## 2011-11-07 NOTE — Telephone Encounter (Signed)
Pt calling wanting to speak to [facility] regarding Dr. Reynaldo Minium and Dr. Olin Pia discussion. Dr. Jacquiline Doe office told pt he could stop taking niacin and gemfibrozil--however pt wants to discuss this with Dr. Caryl Comes.   Please return pt call to discuss further.

## 2011-12-18 ENCOUNTER — Encounter: Payer: Self-pay | Admitting: Internal Medicine

## 2012-01-05 ENCOUNTER — Encounter: Payer: Self-pay | Admitting: Cardiovascular Disease

## 2012-01-05 ENCOUNTER — Ambulatory Visit (INDEPENDENT_AMBULATORY_CARE_PROVIDER_SITE_OTHER): Payer: Medicare Other | Admitting: Cardiovascular Disease

## 2012-01-05 DIAGNOSIS — E785 Hyperlipidemia, unspecified: Secondary | ICD-10-CM

## 2012-01-05 DIAGNOSIS — I499 Cardiac arrhythmia, unspecified: Secondary | ICD-10-CM

## 2012-01-05 DIAGNOSIS — I5022 Chronic systolic (congestive) heart failure: Secondary | ICD-10-CM

## 2012-01-05 DIAGNOSIS — I498 Other specified cardiac arrhythmias: Secondary | ICD-10-CM

## 2012-01-05 DIAGNOSIS — I509 Heart failure, unspecified: Secondary | ICD-10-CM

## 2012-01-05 DIAGNOSIS — I10 Essential (primary) hypertension: Secondary | ICD-10-CM

## 2012-01-05 DIAGNOSIS — I472 Ventricular tachycardia: Secondary | ICD-10-CM

## 2012-01-05 DIAGNOSIS — I2510 Atherosclerotic heart disease of native coronary artery without angina pectoris: Secondary | ICD-10-CM

## 2012-01-05 MED ORDER — NITROGLYCERIN 0.4 MG SL SUBL: 0.4000 mg | SUBLINGUAL_TABLET | SUBLINGUAL | Status: DC | PRN

## 2012-01-05 NOTE — Patient Instructions (Signed)
Your physician recommends that you return for a FASTING lipid profile: 1 month and in 6 months  Your physician wants you to follow-up in: 6 months You will receive a reminder letter in the mail two months in advance. If you don't receive a letter, please call our office to schedule the follow-up appointment.   Your physician recommends that you continue on your current medications as directed. Please refer to the Current Medication list given to you today.

## 2012-01-05 NOTE — Progress Notes (Signed)
[name] Date of Birth  March 07, 1935 [facility] [id number]     [place], [place]  [address] [phone]  Fax  [phone]  History of Present Illness:  [name] is a 77 year old gentleman with a history of coronary artery disease. He is status post anterior wall myocardial infarction with subsequent coronary artery bypass grafting in 1995. He has had congestive heart failure. He has had an ICD placed. He was admitted to the hospital last year with an episode of ventricular tachycardia storm. The VT was well controlled on amiodarone.  He has seen Dr. Caryl Comes. The plan is to decrease the amiodarone dose  after he's been stable for another 6 months.  In October 2012 , he had some liver enzyme elevations.   His Lopid was held for a month or so. His liver enzymes returned to normal. The liver ultrasound was negative. He has restarted his Lopid.  He denies any nausea. His appetite is good.  He is now getting back to his normal activities. He has been able to get down to the beach. He is now walking between 2 and 4 miles every day. He does water aerobics on a daily basis. He's not having episodes of chest pain or shortness of breath.  Current Outpatient Prescriptions on File Prior to Visit  Medication Sig Dispense Refill  . amiodarone (PACERONE) 200 MG tablet Take 200 mg by mouth daily.       [name] Kitchen aspirin 81 MG tablet Take 81 mg by mouth daily.        . carvedilol (COREG) 6.25 MG tablet Take 1 tablet (6.25 mg total) by mouth 2 (two) times daily with a meal.  180 tablet  3  . gemfibrozil (LOPID) 600 MG tablet Take 600 mg by mouth 2 (two) times daily before a meal.        . lisinopril (PRINIVIL,ZESTRIL) 5 MG tablet Take 1 tablet (5 mg total) by mouth daily.  90 tablet  3  . niacin (SLO-NIACIN) 500 MG tablet Take 2,000 mg by mouth daily.       . nitroGLYCERIN (NITROSTAT) 0.4 MG SL tablet Place 0.4 mg under the tongue every 5 (five) minutes as needed.         [name] Kitchen spironolactone (ALDACTONE) 25 MG tablet Take 1 tablet (25 mg total) by mouth daily.  90 tablet  3  . Tamsulosin HCl (FLOMAX) 0.4 MG CAPS Take 1 tablet by mouth daily.        No Known Allergies  Past Medical History  Diagnosis Date  . Myocardial infarction     1995, non-STEMI January 2012  . Ischemic cardiomyopathy     status post CABG with patent vein grafts and an atretic LIMA to his ramus, January 2012  . Ventricular tachycardia      VT Storm-January 2012- responded to Amiodarone  . Hypertension   . Ventricular fibrillation   . Chronic systolic congestive heart failure   . Hyperlipidemia   . Encounter for long-term (current) use of other medications     amiodarone  . Atrial arrhythmia/a fibrillation     not on coumadin 2/2 subdural hematoma  . Subdural hematoma     on coumadin  . Automatic implantable cardiac defibrillator in situ     medtronic    Past Surgical History  Procedure Date  . Insert / replace / remove pacemaker     [place] DR [id number]  . Bilateral cranitomies for sub hematomas  History  Smoking status  . Never Smoker   Smokeless tobacco  . Not on file    History  Alcohol Use No    No family history on file.  Reviw of Systems:  Reviewed in the HPI.  All other systems are negative.  Physical Exam: BP 129/79  Pulse 65  Ht 5' 11.5" (1.816 m)  Wt 168 lb 12.8 oz (76.567 kg)  BMI 23.21 kg/m2 The patient is alert and oriented x 3.  The mood and affect are normal.   Skin: warm and dry.  Color is normal.    HEENT:   the sclera are nonicteric.  The mucous membranes are moist.  The carotids are 2+ without bruits.  There is no thyromegaly.  There is no JVD.    Lungs: clear.  The chest wall is non tender.    Heart: regular rate with a normal S1 and S2.  There are no murmurs, gallops, or rubs. The PMI is not displaced.     Abdomen: good bowel sounds.  There is no guarding or rebound.  There is no hepatosplenomegaly or tenderness.  There  are no masses.   Extremities:  no clubbing, cyanosis, or edema.  The legs are without rashes.  The distal pulses are intact.   Neuro:  Cranial nerves II - XII are intact.  Motor and sensory functions are intact.    The gait is normal.  ECG: AV sequential pacing.  Assessment / Plan:

## 2012-01-05 NOTE — Assessment & Plan Note (Signed)
Been doing very well. He's not had any further episodes of ventricular tachycardia. We will plan on decreasing his amiodarone dose down to 100 mg alternate with 200 mg each day if he remains stable for the next several months.. He'll be seeing Dr. Caryl Comes in several months.

## 2012-01-05 NOTE — Assessment & Plan Note (Signed)
His congestive heart failure has been well controlled. Continue with his same medications.

## 2012-01-20 DIAGNOSIS — M25559 Pain in unspecified hip: Secondary | ICD-10-CM | POA: Diagnosis not present

## 2012-01-20 DIAGNOSIS — S7000XA Contusion of unspecified hip, initial encounter: Secondary | ICD-10-CM | POA: Diagnosis not present

## 2012-01-27 DIAGNOSIS — D485 Neoplasm of uncertain behavior of skin: Secondary | ICD-10-CM | POA: Diagnosis not present

## 2012-01-28 ENCOUNTER — Encounter: Payer: Self-pay | Admitting: Internal Medicine

## 2012-01-29 ENCOUNTER — Ambulatory Visit (INDEPENDENT_AMBULATORY_CARE_PROVIDER_SITE_OTHER): Payer: Medicare Other | Admitting: *Deleted

## 2012-01-29 ENCOUNTER — Encounter: Payer: Self-pay | Admitting: Internal Medicine

## 2012-01-29 DIAGNOSIS — I4729 Other ventricular tachycardia: Secondary | ICD-10-CM | POA: Diagnosis not present

## 2012-01-29 DIAGNOSIS — I472 Ventricular tachycardia, unspecified: Secondary | ICD-10-CM | POA: Diagnosis not present

## 2012-01-29 DIAGNOSIS — I4901 Ventricular fibrillation: Secondary | ICD-10-CM

## 2012-01-29 DIAGNOSIS — Z95810 Presence of automatic (implantable) cardiac defibrillator: Secondary | ICD-10-CM

## 2012-01-31 LAB — REMOTE ICD DEVICE
AL AMPLITUDE: 1.4 mv
ATRIAL PACING ICD: 96 pct
BATTERY VOLTAGE: 2.82 V
DEV-0020ICD: NEGATIVE
MODE SWITCH EPISODES: 159
TOT-0006: 20121102000000
TZAT-0001FASTVT: 1
TZAT-0001FASTVT: 2
TZAT-0004SLOWVT: 8
TZAT-0004SLOWVT: 8
TZAT-0005SLOWVT: 91 pct
TZAT-0011SLOWVT: 10 ms
TZAT-0011SLOWVT: 10 ms
TZAT-0012FASTVT: 200 ms
TZAT-0012FASTVT: 200 ms
TZAT-0012SLOWVT: 200 ms
TZAT-0012SLOWVT: 200 ms
TZAT-0019FASTVT: 8 V
TZAT-0019FASTVT: 8 V
TZAT-0020FASTVT: 1.6 ms
TZAT-0020FASTVT: 1.6 ms
TZON-0003SLOWVT: 400 ms
TZON-0008FASTVT: 0 ms
TZON-0008SLOWVT: 0 ms
TZON-0010FASTVT: 70 ms
TZST-0001FASTVT: 3
TZST-0001FASTVT: 5
TZST-0001SLOWVT: 4
TZST-0001SLOWVT: 5
TZST-0003FASTVT: 35 J
TZST-0003FASTVT: 35 J
TZST-0003SLOWVT: 25 J
TZST-0003SLOWVT: 35 J
VENTRICULAR PACING ICD: 54 pct

## 2012-02-04 NOTE — Progress Notes (Signed)
Remote defib check  

## 2012-02-05 ENCOUNTER — Other Ambulatory Visit (INDEPENDENT_AMBULATORY_CARE_PROVIDER_SITE_OTHER): Payer: Medicare Other | Admitting: *Deleted

## 2012-02-05 DIAGNOSIS — Z79899 Other long term (current) drug therapy: Secondary | ICD-10-CM

## 2012-02-05 DIAGNOSIS — I472 Ventricular tachycardia: Secondary | ICD-10-CM | POA: Diagnosis not present

## 2012-02-05 DIAGNOSIS — I2510 Atherosclerotic heart disease of native coronary artery without angina pectoris: Secondary | ICD-10-CM | POA: Diagnosis not present

## 2012-02-05 LAB — LIPID PANEL
HDL: 48.4 mg/dL (ref >39.00)
Triglycerides: 30 mg/dL (ref 0.0–149.0)
VLDL: 6 mg/dL (ref 0.0–40.0)

## 2012-02-05 LAB — BASIC METABOLIC PANEL
BUN: 15 mg/dL (ref 6–23)
CO2: 27 mEq/L (ref 19–32)
Calcium: 8.6 mg/dL (ref 8.4–10.5)
Glucose, Bld: 93 mg/dL (ref 70–99)
Sodium: 138 mEq/L (ref 135–145)

## 2012-02-05 LAB — TSH: TSH: 2.14 u[IU]/mL (ref 0.35–5.50)

## 2012-02-05 LAB — HEPATIC FUNCTION PANEL
Albumin: 3.3 g/dL — ABNORMAL LOW (ref 3.5–5.2)
Alkaline Phosphatase: 37 U/L — ABNORMAL LOW (ref 39–117)

## 2012-02-07 ENCOUNTER — Encounter (HOSPITAL_COMMUNITY): Payer: Self-pay | Admitting: Emergency Medicine

## 2012-02-07 ENCOUNTER — Other Ambulatory Visit: Payer: Self-pay

## 2012-02-07 ENCOUNTER — Emergency Department (HOSPITAL_COMMUNITY)
Admission: EM | Admit: 2012-02-07 | Discharge: 2012-02-07 | Disposition: A | Discharge status: Home / Self Care | Payer: Medicare Other | Attending: Emergency Medicine | Admitting: Emergency Medicine

## 2012-02-07 DIAGNOSIS — I499 Cardiac arrhythmia, unspecified: Secondary | ICD-10-CM | POA: Diagnosis not present

## 2012-02-07 DIAGNOSIS — E785 Hyperlipidemia, unspecified: Secondary | ICD-10-CM | POA: Diagnosis not present

## 2012-02-07 DIAGNOSIS — Z95810 Presence of automatic (implantable) cardiac defibrillator: Secondary | ICD-10-CM | POA: Diagnosis not present

## 2012-02-07 DIAGNOSIS — I10 Essential (primary) hypertension: Secondary | ICD-10-CM | POA: Insufficient documentation

## 2012-02-07 DIAGNOSIS — I252 Old myocardial infarction: Secondary | ICD-10-CM | POA: Insufficient documentation

## 2012-02-07 DIAGNOSIS — I4891 Unspecified atrial fibrillation: Secondary | ICD-10-CM | POA: Diagnosis not present

## 2012-02-07 DIAGNOSIS — I498 Other specified cardiac arrhythmias: Secondary | ICD-10-CM

## 2012-02-07 LAB — BASIC METABOLIC PANEL
BUN: 15 mg/dL (ref 6–23)
Chloride: 102 mEq/L (ref 96–112)
Glucose, Bld: 91 mg/dL (ref 70–99)
Potassium: 4.5 mEq/L (ref 3.5–5.1)

## 2012-02-07 LAB — CARDIAC PANEL(CRET KIN+CKTOT+MB+TROPI)
Relative Index: 1.9 (ref 0.0–2.5)
Troponin I: <0.3 ng/mL (ref <0.30)

## 2012-02-07 NOTE — ED Notes (Addendum)
Pt states "my heart rate feels normal as long as I am up walking around, it's when I sit down that I feel like it slows down." Pt reports having his pacemaker/defibulator monitored at Cardiologist 3 mons ago and uploaded w/[facility] last week.

## 2012-02-07 NOTE — ED Notes (Signed)
Family at bedside. 

## 2012-02-07 NOTE — ED Notes (Signed)
Pt report woke up at 5am with a stuffy nose and noticed that he had an irregular heart beat. Accompanied symptom of feeling clammy.

## 2012-02-07 NOTE — ED Provider Notes (Signed)
I have personally seen and examined the patient.  I have discussed the plan of care with the resident.  I have reviewed the documentation on PMH/FH/Soc. History.  I have reviewed the documentation of the resident and agree.  Orlie Dakin, MD 02/07/12 1714

## 2012-02-07 NOTE — ED Notes (Signed)
MD at bedside. EDP [place]

## 2012-02-07 NOTE — ED Provider Notes (Signed)
History     CSN: [id number]  Arrival date & time 02/07/12  0817   First MD Initiated Contact with Patient 02/07/12 0830      Chief Complaint  Patient presents with  . Irregular Heart Beat    (Consider location/radiation/quality/duration/timing/severity/associated sxs/prior treatment) HPI Patient is a 77 year old man with history of anterior wall MI s/p CABG and subsequent cardiomyopathy and ICD placement with history of ventricular tachycardia storm  one year ago with subsequent amiodarone therapy. Patient is continued on his amiodarone therapy and has not tapered his dose as of yet. Patient reports that he woke early this morning and was working on the computer when he felt a bit clammy. He denies any chest pain, dizziness, shortness of breath or nausea/vomiting. He feels comfortable walking around, but when he was sitting down his ICD fired a few times over the course of 2 hours, is now asymptomatic. His ICD had not fired since Jan 2012 and he had gone 3 years without it firing prior to that. He denies any other infectious symptoms, no fever, chills, cough.   Past Medical History  Diagnosis Date  . Myocardial infarction     1995, non-STEMI January 2012  . Ischemic cardiomyopathy     status post CABG with patent vein grafts and an atretic LIMA to his ramus, January 2012  . Ventricular tachycardia      VT Storm-January 2012- responded to Amiodarone  . Hypertension   . Ventricular fibrillation   . Chronic systolic congestive heart failure   . Hyperlipidemia   . Encounter for long-term (current) use of other medications     amiodarone  . Atrial arrhythmia/a fibrillation     not on coumadin 2/2 subdural hematoma  . Subdural hematoma     on coumadin  . Automatic implantable cardiac defibrillator in situ     medtronic    Past Surgical History  Procedure Date  . Insert / replace / remove pacemaker     [place] DR [id number]  . Bilateral cranitomies for sub hematomas   . Bypass  graft     History reviewed. No pertinent family history.  History  Substance Use Topics  . Smoking status: Never Smoker   . Smokeless tobacco: Not on file  . Alcohol Use: No     Review of Systems Denies melena, hematochezia.   Allergies  Review of patient's allergies indicates no known allergies.  Home Medications   Current Outpatient Rx  Name Route Sig Dispense Refill  . AMIODARONE HCL 200 MG PO TABS Oral Take 200 mg by mouth daily.     . ASPIRIN 81 MG PO TABS Oral Take 81 mg by mouth daily.      [name] Kitchen CARVEDILOL 6.25 MG PO TABS Oral Take 6.25 mg by mouth 2 (two) times daily with a meal.    . GEMFIBROZIL 600 MG PO TABS Oral Take 600 mg by mouth 2 (two) times daily before a meal.      . LEVOTHYROXINE SODIUM 50 MCG PO TABS Oral Take 50 mcg by mouth daily.      [name] Kitchen LISINOPRIL 5 MG PO TABS Oral Take 5 mg by mouth daily.    [name] Kitchen NIACIN ER 500 MG PO TBCR Oral Take 2,000 mg by mouth daily.     [name] Kitchen NITROGLYCERIN 0.4 MG SL SUBL Sublingual Place 0.4 mg under the tongue every 5 (five) minutes as needed. For chest pain x 3 doses    . SPIRONOLACTONE 25 MG PO TABS Oral  Take 25 mg by mouth daily.    [name] Kitchen TAMSULOSIN HCL 0.4 MG PO CAPS Oral Take 1 tablet by mouth daily.      BP 127/62  Pulse 69  Temp(Src) 97.3 F (36.3 C) (Oral)  Resp 18  SpO2 98%  Physical Exam General: elderly man resting in bed in no acute distress HEENT: PERRL, EOMI, no scleral icterus Cardiac: regular for 3 beats with 4th beat occuring after shortened interval, no rubs, murmurs or gallops Pulm: clear to auscultation bilaterally, moving normal volumes of air Abd: soft, nontender, nondistended, BS present Ext: warm and well perfused, no pedal edema Neuro: alert and oriented X3, cranial nerves II-XII grossly intact  ED Course  Procedures (including critical care time)  Labs Reviewed - No data to display No results found.   No diagnosis found.    MDM  Spoke with Dr. Haroldine Laws on call for [facility] cardiology, we will  interrogate his ICD, check BMET, magnesium and cardiac enzymes. Labs all WNL, and ICD has not discharged. There was increase in atrial rate this AM but ventricular rate was in 60s. This did not correspond with the timeline that patient described. Spoke with Dr. Haroldine Laws about these results and he agrees patient is fine to return home and follow up with [place] next week.        Hans Eden, MD 02/07/12 1139

## 2012-02-07 NOTE — ED Notes (Signed)
MD at bedside. Dr. Marcello Moores

## 2012-02-07 NOTE — ED Notes (Signed)
Patient denies pain and is resting comfortably.  

## 2012-02-07 NOTE — ED Provider Notes (Signed)
Patient awakened this morning with stuffy nose and then felt skipped heartbeats lasting for approximately 2 hours resolved upon arriving , without treatment also reports feeling "clammy" for 15 minutes this morning no chest pain no shortness of breath no other complaint symptoms only at rest improved with walking around he is presently asymptomatic on exam, no distress lungs clear auscultation heart regular rate and rhythm  Date: 02/07/2012  Rate: 65  Rhythm: Electronically paced  QRS Axis: left  Intervals: normal  ST/T Wave abnormalities: normal and nonspecific ST/T changes  Conduction Disutrbances:nonspecific intraventricular conduction delay  Narrative Interpretation:   Old EKG Reviewed: unchanged No change from 01/22/2011    Orlie Dakin, MD 02/07/12 [id number]

## 2012-02-07 NOTE — ED Notes (Addendum)
Pt reports waking 0530 this am w.nasal congestion, clammy, and a irregular heartbeat, Pt reports he has no change in heart rhythm w/walking just when he sits down. Pt denies chest/abd/back.shoulder/neck pain, sob, or N/V. Pt reports his pacer/defibulator shocked him 6 times in January 2012, no shocks received since then.

## 2012-02-17 ENCOUNTER — Encounter: Payer: Self-pay | Admitting: *Deleted

## 2012-03-09 ENCOUNTER — Other Ambulatory Visit: Payer: Self-pay

## 2012-03-09 ENCOUNTER — Ambulatory Visit (AMBULATORY_SURGERY_CENTER): Payer: Medicare Other | Admitting: *Deleted

## 2012-03-09 VITALS — Ht 71.0 in | Wt 169.0 lb

## 2012-03-09 DIAGNOSIS — Z1211 Encounter for screening for malignant neoplasm of colon: Secondary | ICD-10-CM

## 2012-03-09 MED ORDER — PEG-KCL-NACL-NASULF-NA ASC-C 100 G PO SOLR: ORAL | Status: DC

## 2012-03-10 ENCOUNTER — Other Ambulatory Visit: Payer: Self-pay | Admitting: *Deleted

## 2012-03-10 MED ORDER — GEMFIBROZIL 600 MG PO TABS: 600.0000 mg | ORAL_TABLET | Freq: Two times a day (BID) | ORAL | Status: DC

## 2012-03-16 ENCOUNTER — Other Ambulatory Visit: Payer: Self-pay | Admitting: *Deleted

## 2012-03-16 MED ORDER — AMIODARONE HCL 200 MG PO TABS: 200.0000 mg | ORAL_TABLET | Freq: Every day | ORAL | Status: DC

## 2012-03-16 NOTE — Telephone Encounter (Signed)
Fax Received. Refill Completed. Shown Dissinger Chowoe (R.M.A)   

## 2012-03-23 ENCOUNTER — Ambulatory Visit (AMBULATORY_SURGERY_CENTER): Payer: Medicare Other | Admitting: Internal Medicine

## 2012-03-23 ENCOUNTER — Encounter: Payer: Self-pay | Admitting: Internal Medicine

## 2012-03-23 ENCOUNTER — Other Ambulatory Visit: Payer: Self-pay | Admitting: Cardiovascular Disease

## 2012-03-23 VITALS — BP 131/78 | HR 60 | Temp 96.8°F | Resp 18 | Ht 71.0 in | Wt 169.0 lb

## 2012-03-23 DIAGNOSIS — E785 Hyperlipidemia, unspecified: Secondary | ICD-10-CM | POA: Diagnosis not present

## 2012-03-23 DIAGNOSIS — K5730 Diverticulosis of large intestine without perforation or abscess without bleeding: Secondary | ICD-10-CM | POA: Diagnosis not present

## 2012-03-23 DIAGNOSIS — Z1211 Encounter for screening for malignant neoplasm of colon: Secondary | ICD-10-CM | POA: Diagnosis not present

## 2012-03-23 DIAGNOSIS — Z86010 Personal history of colonic polyps: Secondary | ICD-10-CM

## 2012-03-23 DIAGNOSIS — I10 Essential (primary) hypertension: Secondary | ICD-10-CM | POA: Diagnosis not present

## 2012-03-23 DIAGNOSIS — I509 Heart failure, unspecified: Secondary | ICD-10-CM | POA: Diagnosis not present

## 2012-03-23 MED ORDER — SODIUM CHLORIDE 0.9 % IV SOLN: 500.0000 mL | INTRAVENOUS | Status: DC

## 2012-03-23 NOTE — Patient Instructions (Signed)
YOU HAD AN ENDOSCOPIC PROCEDURE TODAY AT THE [facility]: Refer to the procedure report that was given to you for any specific questions about what was found during the examination.  If the procedure report does not answer your questions, please call your gastroenterologist to clarify.  If you requested that your care partner not be given the details of your procedure findings, then the procedure report has been included in a sealed envelope for you to review at your convenience later.  YOU SHOULD EXPECT: Some feelings of bloating in the abdomen. Passage of more gas than usual.  Walking can help get rid of the air that was put into your GI tract during the procedure and reduce the bloating. If you had a lower endoscopy (such as a colonoscopy or flexible sigmoidoscopy) you may notice spotting of blood in your stool or on the toilet paper. If you underwent a bowel prep for your procedure, then you may not have a normal bowel movement for a few days.  DIET: Your first meal following the procedure should be a light meal and then it is ok to progress to your normal diet.  A half-sandwich or bowl of soup is an example of a good first meal.  Heavy or fried foods are harder to digest and may make you feel nauseous or bloated.  Likewise meals heavy in dairy and vegetables can cause extra gas to form and this can also increase the bloating.  Drink plenty of fluids but you should avoid alcoholic beverages for 24 hours.  ACTIVITY: Your care partner should take you home directly after the procedure.  You should plan to take it easy, moving slowly for the rest of the day.  You can resume normal activity the day after the procedure however you should NOT DRIVE or use heavy machinery for 24 hours (because of the sedation medicines used during the test).    SYMPTOMS TO REPORT IMMEDIATELY: A gastroenterologist can be reached at any hour.  During normal business hours, 8:30 AM to 5:00 PM Monday through Friday,  call [phone].  After hours and on weekends, please call the GI answering service at [phone] who will take a message and have the physician on call contact you.   Following lower endoscopy (colonoscopy or flexible sigmoidoscopy):  Excessive amounts of blood in the stool  Significant tenderness or worsening of abdominal pains  Swelling of the abdomen that is new, acute  Fever of 100F or higher  Following upper endoscopy (EGD)  Vomiting of blood or coffee ground material  New chest pain or pain under the shoulder blades  Painful or persistently difficult swallowing  New shortness of breath  Fever of 100F or higher  Black, tarry-looking stools  FOLLOW UP: If any biopsies were taken you will be contacted by phone or by letter within the next 1-3 weeks.  Call your gastroenterologist if you have not heard about the biopsies in 3 weeks.  Our staff will call the home number listed on your records the next business day following your procedure to check on you and address any questions or concerns that you may have at that time regarding the information given to you following your procedure. This is a courtesy call and so if there is no answer at the home number and we have not heard from you through the emergency physician on call, we will assume that you have returned to your regular daily activities without incident.  SIGNATURES/CONFIDENTIALITY: You and/or your care   partner have signed paperwork which will be entered into your electronic medical record.  These signatures attest to the fact that that the information above on your After Visit Summary has been reviewed and is understood.  Full responsibility of the confidentiality of this discharge information lies with you and/or your care-partner.  

## 2012-03-23 NOTE — Op Note (Signed)
[address] [place], [place]  [id number]  COLONOSCOPY PROCEDURE REPORT  PATIENT:  [name], [name]  MR#:  [id number] BIRTHDATE:  1935-10-19, 76 yrs. old  GENDER:  male ENDOSCOPIST:  Docia Chuck. Geri Seminole, MD REF. BY:  Surveillance Program Recall, PROCEDURE DATE:  03/23/2012 PROCEDURE:  Surveillance Colonoscopy ASA CLASS:  Class III INDICATIONS:  history of pre-cancerous (adenomatous) colon polyps, surveillance and high-risk screening ; 2005 (TA); f/u 2008 (-) MEDICATIONS:   MAC sedation, administered by CRNA, propofol (Diprivan) 200 mg IV  DESCRIPTION OF PROCEDURE:   After the risks benefits and alternatives of the procedure were thoroughly explained, informed consent was obtained.  Digital rectal exam was performed and revealed no abnormalities.   The [id number] endoscope was introduced through the anus and advanced to the cecum, which was identified by both the appendix and ileocecal valve, without limitations.  The quality of the prep was excellent, using MoviPrep.  The instrument was then slowly withdrawn as the colon was fully examined. <<PROCEDUREIMAGES>>  FINDINGS:  Moderate diverticulosis was found throughout the colon. Otherwise normal colonoscopy without other polyps, masses, vascular ectasias, or inflammatory changes.   Retroflexed views in the rectum revealed no abnormalities.    The time to cecum = 3:05 minutes. The scope was then withdrawn in  10:42  minutes from the cecum and the procedure completed.  COMPLICATIONS:  None  ENDOSCOPIC IMPRESSION: 1) Moderate diverticulosis throughout the colon 2) Otherwise normal colonoscopy  RECOMMENDATIONS: 1) No routine follow up planned. Return to the care of your primary provider. GI follow up as needed  ______________________________ Docia Chuck. Geri Seminole, MD  CC:  Burnard Bunting, MD;  The Patient  n. eSIGNED:   Docia Chuck. Geri Seminole at 03/23/2012 09:40 AM  [name], [id number]

## 2012-03-23 NOTE — Progress Notes (Signed)
Patient did not experience any of the following events: a burn prior to discharge; a fall within the facility; wrong site/side/patient/procedure/implant event; or a hospital transfer or hospital admission upon discharge from the facility. [phone])

## 2012-03-24 ENCOUNTER — Telehealth: Payer: Self-pay | Admitting: *Deleted

## 2012-03-24 NOTE — Telephone Encounter (Signed)
  Follow up Call-  Call back number 03/23/2012  Post procedure Call Back phone  # [phone]  Permission to leave phone message Yes     Patient questions:  Do you have a fever, pain , or abdominal swelling? no Pain Score  0 *  Have you tolerated food without any problems? yes  Have you been able to return to your normal activities? yes  Do you have any questions about your discharge instructions: Diet   no Medications  no Follow up visit  no  Do you have questions or concerns about your Care? no  Actions: * If pain score is 4 or above: No action needed, pain <4.

## 2012-03-29 ENCOUNTER — Other Ambulatory Visit: Payer: Self-pay | Admitting: Cardiovascular Disease

## 2012-03-29 NOTE — Telephone Encounter (Signed)
Fax Received. Refill Completed. Evanne Matsunaga Chowoe (R.M.A)   

## 2012-04-13 DIAGNOSIS — I2510 Atherosclerotic heart disease of native coronary artery without angina pectoris: Secondary | ICD-10-CM | POA: Diagnosis not present

## 2012-04-13 DIAGNOSIS — I498 Other specified cardiac arrhythmias: Secondary | ICD-10-CM | POA: Diagnosis not present

## 2012-04-13 DIAGNOSIS — I499 Cardiac arrhythmia, unspecified: Secondary | ICD-10-CM | POA: Diagnosis not present

## 2012-04-13 DIAGNOSIS — I10 Essential (primary) hypertension: Secondary | ICD-10-CM | POA: Diagnosis not present

## 2012-04-26 ENCOUNTER — Ambulatory Visit (INDEPENDENT_AMBULATORY_CARE_PROVIDER_SITE_OTHER): Payer: Medicare Other | Admitting: *Deleted

## 2012-04-26 ENCOUNTER — Encounter: Payer: Self-pay | Admitting: Internal Medicine

## 2012-04-26 DIAGNOSIS — I472 Ventricular tachycardia: Secondary | ICD-10-CM | POA: Diagnosis not present

## 2012-04-26 DIAGNOSIS — I4901 Ventricular fibrillation: Secondary | ICD-10-CM | POA: Diagnosis not present

## 2012-04-27 LAB — REMOTE ICD DEVICE
MODE SWITCH EPISODES: 181
RV LEAD IMPEDENCE ICD: 440 Ohm
TZAT-0001FASTVT: 1
TZAT-0001FASTVT: 2
TZAT-0001SLOWVT: 1
TZAT-0001SLOWVT: 2
TZAT-0004SLOWVT: 8
TZAT-0004SLOWVT: 8
TZAT-0005FASTVT: 88 pct
TZAT-0005FASTVT: 91 pct
TZAT-0005SLOWVT: 84 pct
TZAT-0005SLOWVT: 91 pct
TZAT-0013FASTVT: 2
TZAT-0013FASTVT: 2
TZAT-0018FASTVT: NEGATIVE
TZAT-0018FASTVT: NEGATIVE
TZAT-0020FASTVT: 1.6 ms
TZAT-0020SLOWVT: 1.6 ms
TZAT-0020SLOWVT: 1.6 ms
TZON-0010AFLUTTER: 70 ms
TZON-0010FASTVT: 70 ms
TZST-0001FASTVT: 3
TZST-0001FASTVT: 4
TZST-0001FASTVT: 6
TZST-0001SLOWVT: 3
TZST-0001SLOWVT: 5
TZST-0001SLOWVT: 6
TZST-0003FASTVT: 35 J
TZST-0003FASTVT: 35 J
TZST-0003SLOWVT: 35 J
VENTRICULAR PACING ICD: 47 pct

## 2012-05-03 NOTE — Progress Notes (Signed)
Remote icd check  

## 2012-05-10 ENCOUNTER — Other Ambulatory Visit: Payer: Self-pay | Admitting: *Deleted

## 2012-05-18 ENCOUNTER — Encounter: Payer: Self-pay | Admitting: *Deleted

## 2012-06-07 DIAGNOSIS — R5381 Other malaise: Secondary | ICD-10-CM | POA: Diagnosis not present

## 2012-06-07 DIAGNOSIS — R21 Rash and other nonspecific skin eruption: Secondary | ICD-10-CM | POA: Diagnosis not present

## 2012-06-07 DIAGNOSIS — H6500 Acute serous otitis media, unspecified ear: Secondary | ICD-10-CM | POA: Diagnosis not present

## 2012-06-07 DIAGNOSIS — R5383 Other fatigue: Secondary | ICD-10-CM | POA: Diagnosis not present

## 2012-07-13 ENCOUNTER — Encounter: Payer: Self-pay | Admitting: Internal Medicine

## 2012-07-13 ENCOUNTER — Ambulatory Visit (INDEPENDENT_AMBULATORY_CARE_PROVIDER_SITE_OTHER): Payer: Medicare Other | Admitting: Internal Medicine

## 2012-07-13 VITALS — BP 127/76 | HR 61 | Ht 71.0 in | Wt 165.0 lb

## 2012-07-13 DIAGNOSIS — I472 Ventricular tachycardia: Secondary | ICD-10-CM

## 2012-07-13 DIAGNOSIS — Z95810 Presence of automatic (implantable) cardiac defibrillator: Secondary | ICD-10-CM

## 2012-07-13 DIAGNOSIS — R7989 Other specified abnormal findings of blood chemistry: Secondary | ICD-10-CM

## 2012-07-13 DIAGNOSIS — I2589 Other forms of chronic ischemic heart disease: Secondary | ICD-10-CM | POA: Diagnosis not present

## 2012-07-13 DIAGNOSIS — I509 Heart failure, unspecified: Secondary | ICD-10-CM | POA: Diagnosis not present

## 2012-07-13 DIAGNOSIS — I499 Cardiac arrhythmia, unspecified: Secondary | ICD-10-CM

## 2012-07-13 DIAGNOSIS — I5022 Chronic systolic (congestive) heart failure: Secondary | ICD-10-CM | POA: Diagnosis not present

## 2012-07-13 DIAGNOSIS — I498 Other specified cardiac arrhythmias: Secondary | ICD-10-CM

## 2012-07-13 DIAGNOSIS — I255 Ischemic cardiomyopathy: Secondary | ICD-10-CM

## 2012-07-13 LAB — ICD DEVICE OBSERVATION
AL AMPLITUDE: 2 mv
AL IMPEDENCE ICD: 464 Ohm
AL THRESHOLD: 1 V
ATRIAL PACING ICD: 95 pct
FVT: 0
RV LEAD AMPLITUDE: 9 mv
RV LEAD IMPEDENCE ICD: 448 Ohm
RV LEAD THRESHOLD: 1 V
TZAT-0001FASTVT: 1
TZAT-0001FASTVT: 2
TZAT-0004FASTVT: 8
TZAT-0004FASTVT: 8
TZAT-0004SLOWVT: 8
TZAT-0004SLOWVT: 8
TZAT-0005FASTVT: 88 pct
TZAT-0005FASTVT: 91 pct
TZAT-0005SLOWVT: 84 pct
TZAT-0005SLOWVT: 91 pct
TZAT-0011SLOWVT: 10 ms
TZAT-0012SLOWVT: 200 ms
TZAT-0012SLOWVT: 200 ms
TZAT-0013FASTVT: 2
TZAT-0013FASTVT: 2
TZAT-0013SLOWVT: 3
TZAT-0013SLOWVT: 3
TZAT-0020FASTVT: 1.6 ms
TZAT-0020FASTVT: 1.6 ms
TZON-0003SLOWVT: 400 ms
TZON-0004SLOWVT: 24
TZON-0005SLOWVT: 40
TZON-0010FASTVT: 70 ms
TZST-0001FASTVT: 3
TZST-0001FASTVT: 5
TZST-0001SLOWVT: 4
TZST-0001SLOWVT: 6
TZST-0003FASTVT: 35 J
TZST-0003FASTVT: 35 J
TZST-0003SLOWVT: 35 J
VENTRICULAR PACING ICD: 56 pct

## 2012-07-13 NOTE — Assessment & Plan Note (Signed)
No intercurrent atrial fibrillation. It should be addressed as to whether resumption of anticoagulation is appropriate. The presence of a subdural hematoma is not preclusive.  \ We'll check amiodarone surveillance laboratories

## 2012-07-13 NOTE — Progress Notes (Signed)
F.[email or web address] .[email or web address]  HPI  [name] is a 77 y.o. male  Seen in followup for VT in the setting of ischemic cardiomyopathy with prior myocardial infarction, prior bypass surgery in 1995 with recentventricular tachycardia storm and non-STEMI.  He has a history of with recent catheterization (January 2012) demonstrated patent vein grafts with an atretic LIMA to his ramus. Ejection fraction was 15-20%.  He was treated with amiodarone complicated at high dose by nausea and jitteriness. These symptoms have largely abated.  Patient denies symptoms of GI intolerance, sun sensitivity, neurological symptoms attributable to amiodarone.  He has a history of reported cardiac arrest and is status post ICD implantation.  The patient denies chest pain, shortness of breath, nocturnal dyspnea, orthopnea or peripheral edema. There have been no palpitations, lightheadedness or syncope.   Past Medical History  Diagnosis Date  . Myocardial infarction     1995, non-STEMI January 2012  . Ischemic cardiomyopathy     status post CABG with patent vein grafts and an atretic LIMA to his ramus, January 2012  . Ventricular tachycardia      VT Storm-January 2012- responded to Amiodarone  . Hypertension   . Ventricular fibrillation   . Chronic systolic congestive heart failure   . Hyperlipidemia   . Encounter for long-term (current) use of other medications     amiodarone  . Atrial arrhythmia/a fibrillation     not on coumadin 2/2 subdural hematoma  . Subdural hematoma     on coumadin  . Automatic implantable cardiac defibrillator in situ     medtronic  . CHF (congestive heart failure)   . Thyroid disease     hypothyroid    Past Surgical History  Procedure Date  . Insert / replace / remove pacemaker     [place] DR [id number]  . Bilateral cranitomies for sub hematomas   . Bypass graft 1995    5  . Coronary artery bypass graft 1995    5    Current Outpatient Prescriptions  Medication Sig Dispense  Refill  . amiodarone (PACERONE) 200 MG tablet Take 1 tablet (200 mg total) by mouth daily.  30 tablet  5  . aspirin 81 MG tablet Take 81 mg by mouth daily.        . carvedilol (COREG) 6.25 MG tablet TAKE 1 TABLET BY MOUTH TWICE DAILY WITH A MEAL  180 tablet  3  . gemfibrozil (LOPID) 600 MG tablet Take 1 tablet (600 mg total) by mouth 2 (two) times daily before a meal.  60 tablet  6  . levothyroxine (SYNTHROID, LEVOTHROID) 50 MCG tablet Take 50 mcg by mouth daily.        [name] Kitchen lisinopril (PRINIVIL,ZESTRIL) 5 MG tablet TAKE 1 TABLET BY MOUTH DAILY  90 tablet  3  . niacin (SLO-NIACIN) 500 MG tablet Take 2,000 mg by mouth daily.       . nitroGLYCERIN (NITROSTAT) 0.4 MG SL tablet Place 0.4 mg under the tongue every 5 (five) minutes as needed. For chest pain x 3 doses      . spironolactone (ALDACTONE) 25 MG tablet Take 25 mg by mouth daily.      . Tamsulosin HCl (FLOMAX) 0.4 MG CAPS Take 1 tablet by mouth daily.        No Known Allergies  Review of Systems negative except from HPI and PMH  Physical Exam BP 127/76  Pulse 61  Ht 5\' 11"  (1.803 m)  Wt 165 lb (74.844 kg)  BMI 23.01 kg/m2  Well developed and well nourished in no acute distress HENT normal E scleral and icterus clear Neck Supple JVP flat; carotids brisk and full Clear to ausculation egular rate and rhythm, no murmurs gallops or rub Soft with active bowel sounds No clubbing cyanosis none Edema Alert and oriented, grossly normal motor and sensory function Skin Warm and Dry   Electrocardiogram today demonstrates atrial pacing with intrinsic conduction with right bundle branch block left axis deviation  Assessment and  Plan

## 2012-07-13 NOTE — Assessment & Plan Note (Signed)
No intercurrent Ventricular tachycardia  

## 2012-07-13 NOTE — Assessment & Plan Note (Signed)
The patient's device was interrogated.  The information was reviewed. No changes were made in the programming.    

## 2012-07-13 NOTE — Patient Instructions (Signed)
Remote monitoring is used to monitor your Pacemaker of ICD from home. This monitoring reduces the number of office visits required to check your device to one time per year. It allows [place] to keep an eye on the functioning of your device to ensure it is working properly. You are scheduled for a device check from home on 10/18/12. You may send your transmission at any time that day. If you have a wireless device, the transmission will be sent automatically. After your physician reviews your transmission, you will receive a postcard with your next transmission date.  Your physician wants you to follow-up in: 1 year with Dr. Caryl Comes. You will receive a reminder letter in the mail two months in advance. If you don't receive a letter, please call our office to schedule the follow-up appointment.  Your physician recommends that you continue on your current medications as directed. Please refer to the Current Medication list given to you today.

## 2012-07-13 NOTE — Assessment & Plan Note (Signed)
Stable continue current medications

## 2012-07-13 NOTE — Assessment & Plan Note (Signed)
Will recheck in am

## 2012-07-14 ENCOUNTER — Ambulatory Visit (INDEPENDENT_AMBULATORY_CARE_PROVIDER_SITE_OTHER): Payer: Medicare Other | Admitting: Cardiovascular Disease

## 2012-07-14 ENCOUNTER — Encounter: Payer: Self-pay | Admitting: Cardiovascular Disease

## 2012-07-14 ENCOUNTER — Other Ambulatory Visit (INDEPENDENT_AMBULATORY_CARE_PROVIDER_SITE_OTHER): Payer: Medicare Other

## 2012-07-14 VITALS — BP 108/68 | HR 64 | Ht 71.0 in | Wt 161.0 lb

## 2012-07-14 DIAGNOSIS — I2589 Other forms of chronic ischemic heart disease: Secondary | ICD-10-CM

## 2012-07-14 DIAGNOSIS — E785 Hyperlipidemia, unspecified: Secondary | ICD-10-CM

## 2012-07-14 DIAGNOSIS — D235 Other benign neoplasm of skin of trunk: Secondary | ICD-10-CM | POA: Diagnosis not present

## 2012-07-14 DIAGNOSIS — I255 Ischemic cardiomyopathy: Secondary | ICD-10-CM

## 2012-07-14 DIAGNOSIS — I509 Heart failure, unspecified: Secondary | ICD-10-CM

## 2012-07-14 DIAGNOSIS — I498 Other specified cardiac arrhythmias: Secondary | ICD-10-CM

## 2012-07-14 DIAGNOSIS — D1801 Hemangioma of skin and subcutaneous tissue: Secondary | ICD-10-CM | POA: Diagnosis not present

## 2012-07-14 DIAGNOSIS — I472 Ventricular tachycardia: Secondary | ICD-10-CM | POA: Diagnosis not present

## 2012-07-14 DIAGNOSIS — I5022 Chronic systolic (congestive) heart failure: Secondary | ICD-10-CM | POA: Diagnosis not present

## 2012-07-14 DIAGNOSIS — R7989 Other specified abnormal findings of blood chemistry: Secondary | ICD-10-CM

## 2012-07-14 DIAGNOSIS — Z95810 Presence of automatic (implantable) cardiac defibrillator: Secondary | ICD-10-CM

## 2012-07-14 DIAGNOSIS — I499 Cardiac arrhythmia, unspecified: Secondary | ICD-10-CM

## 2012-07-14 LAB — TSH: TSH: 2.2 u[IU]/mL (ref 0.35–5.50)

## 2012-07-14 NOTE — Assessment & Plan Note (Addendum)
[name] is doing very well. He has chronic systolic congestive heart failure due to ischemic heart disease. We will continue the same medications he seems to be doing quite well. I seen again in 6 months for followup office visit, fasting lab work, and EKG.

## 2012-07-14 NOTE — Progress Notes (Signed)
[name] Date of Birth  [birth year]       [facility]    [employer] [address], [address], [place], [place]  [address]   [place], [place]  [address] [phone]     [phone]   Fax  [phone]    Fax [phone]  Problem List: 1. Coronary artery disease-status post anterior wall myocardial infarction. His subsequent CABG in 1995 2. Congestive heart failure 3. History of ICD 4. Ventricular tachycardia storm-currently controlled on amiodarone 5. Subdural hematoma while on Coumadin 2. Hypothyroidism  History of Present Illness: [name] is a 77 year old gentleman with a history of coronary artery disease. He is status post anterior wall myocardial infarction with subsequent coronary artery bypass grafting in 1995. He has had congestive heart failure. He has had an ICD placed. He was admitted to the hospital last year with an episode of ventricular tachycardia storm. The VT was well controlled on amiodarone.  He has seen Dr. Caryl Comes. The plan is to decrease the amiodarone dose  after he's been stable for another 6 months.  In October 2012 , he had some liver enzyme elevations.   His Lopid was held for a month or so. His liver enzymes returned to normal. The liver ultrasound was negative. He has restarted his Lopid.  He denies any nausea. His appetite is good.  He is now getting back to his normal activities. He has been able to get down to the beach. He is now walking between 2 and 4 miles every day. He does water aerobics on a daily basis. He's not having episodes of chest pain or shortness of breath.   Current Outpatient Prescriptions on File Prior to Visit  Medication Sig Dispense Refill  . amiodarone (PACERONE) 200 MG tablet Take 1 tablet (200 mg total) by mouth daily.  30 tablet  5  . aspirin 81 MG tablet Take 81 mg by mouth daily.        . carvedilol (COREG) 6.25 MG tablet TAKE 1 TABLET BY MOUTH TWICE DAILY WITH A MEAL  180  tablet  3  . gemfibrozil (LOPID) 600 MG tablet Take 1 tablet (600 mg total) by mouth 2 (two) times daily before a meal.  60 tablet  6  . levothyroxine (SYNTHROID, LEVOTHROID) 50 MCG tablet Take 50 mcg by mouth daily.        [name] Kitchen lisinopril (PRINIVIL,ZESTRIL) 5 MG tablet TAKE 1 TABLET BY MOUTH DAILY  90 tablet  3  . niacin (SLO-NIACIN) 500 MG tablet Take 2,000 mg by mouth daily.       . nitroGLYCERIN (NITROSTAT) 0.4 MG SL tablet Place 0.4 mg under the tongue every 5 (five) minutes as needed. For chest pain x 3 doses      . spironolactone (ALDACTONE) 25 MG tablet Take 25 mg by mouth daily.      . Tamsulosin HCl (FLOMAX) 0.4 MG CAPS Take 1 tablet by mouth daily.        No Known Allergies  Past Medical History  Diagnosis Date  . Myocardial infarction     1995, non-STEMI January 2012  . Ischemic cardiomyopathy     status post CABG with patent vein grafts and an atretic LIMA to his ramus, January 2012  . Ventricular tachycardia      VT Storm-January 2012- responded to Amiodarone  . Hypertension   . Ventricular fibrillation   . Chronic systolic congestive heart failure   . Hyperlipidemia   .  Encounter for long-term (current) use of other medications     amiodarone  . Atrial arrhythmia/a fibrillation     not on coumadin 2/2 subdural hematoma  . Subdural hematoma     on coumadin  . Automatic implantable cardiac defibrillator in situ     medtronic  . CHF (congestive heart failure)   . Thyroid disease     hypothyroid    Past Surgical History  Procedure Date  . Insert / replace / remove pacemaker     [place] DR [id number]  . Bilateral cranitomies for sub hematomas   . Bypass graft 1995    5  . Coronary artery bypass graft 1995    5    History  Smoking status  . Never Smoker   Smokeless tobacco  . Former Systems developer    History  Alcohol Use No    No family history on file.  Reviw of Systems:  Reviewed in the HPI.  All other systems are negative.  Physical Exam: Blood  pressure 108/68, pulse 64, height 5\' 11"  (1.803 m), weight 161 lb (73.029 kg). General: Well developed, well nourished, in no acute distress.  Head: Normocephalic, atraumatic, sclera non-icteric, mucus membranes are moist,   Neck: Supple. Carotids are 2 + without bruits. No JVD  Lungs: Clear bilaterally to auscultation.  Heart: regular rate.  normal  S1 S2. No murmurs, gallops or rubs.  Abdomen: Soft, non-tender, non-distended with normal bowel sounds. No hepatomegaly. No rebound/guarding. No masses.  Msk:  Strength and tone are normal  Extremities: No clubbing or cyanosis. No edema.  Distal pedal pulses are 2+ and equal bilaterally.  Neuro: Alert and oriented X 3. Moves all extremities spontaneously.  Psych:  Responds to questions appropriately with a normal affect.  ECG:  Assessment / Plan:

## 2012-07-14 NOTE — Patient Instructions (Addendum)
Your physician recommends that you return for a FASTING lipid profile: today  Your physician wants you to follow-up in: 6 months You will receive a reminder letter in the mail two months in advance. If you don't receive a letter, please call our office to schedule the follow-up appointment.

## 2012-08-17 DIAGNOSIS — M999 Biomechanical lesion, unspecified: Secondary | ICD-10-CM | POA: Diagnosis not present

## 2012-08-17 DIAGNOSIS — M5137 Other intervertebral disc degeneration, lumbosacral region: Secondary | ICD-10-CM | POA: Diagnosis not present

## 2012-08-18 DIAGNOSIS — M5137 Other intervertebral disc degeneration, lumbosacral region: Secondary | ICD-10-CM | POA: Diagnosis not present

## 2012-08-18 DIAGNOSIS — M999 Biomechanical lesion, unspecified: Secondary | ICD-10-CM | POA: Diagnosis not present

## 2012-08-19 DIAGNOSIS — M999 Biomechanical lesion, unspecified: Secondary | ICD-10-CM | POA: Diagnosis not present

## 2012-08-19 DIAGNOSIS — M5137 Other intervertebral disc degeneration, lumbosacral region: Secondary | ICD-10-CM | POA: Diagnosis not present

## 2012-08-23 ENCOUNTER — Other Ambulatory Visit: Payer: Self-pay | Admitting: Cardiovascular Disease

## 2012-08-23 DIAGNOSIS — M5137 Other intervertebral disc degeneration, lumbosacral region: Secondary | ICD-10-CM | POA: Diagnosis not present

## 2012-08-23 DIAGNOSIS — M999 Biomechanical lesion, unspecified: Secondary | ICD-10-CM | POA: Diagnosis not present

## 2012-09-21 ENCOUNTER — Other Ambulatory Visit: Payer: Self-pay | Admitting: Cardiovascular Disease

## 2012-09-21 NOTE — Telephone Encounter (Signed)
Fax Received. Refill Completed. [name] (R.M.A)   

## 2012-10-07 ENCOUNTER — Other Ambulatory Visit: Payer: Self-pay | Admitting: *Deleted

## 2012-10-07 MED ORDER — AMIODARONE HCL 200 MG PO TABS: 200.0000 mg | ORAL_TABLET | Freq: Every day | ORAL | Status: DC

## 2012-10-07 NOTE — Telephone Encounter (Signed)
Fax Received. Refill Completed. Sutton Hirsch Chowoe (R.M.A)   

## 2012-10-12 DIAGNOSIS — I2510 Atherosclerotic heart disease of native coronary artery without angina pectoris: Secondary | ICD-10-CM | POA: Diagnosis not present

## 2012-10-12 DIAGNOSIS — Z125 Encounter for screening for malignant neoplasm of prostate: Secondary | ICD-10-CM | POA: Diagnosis not present

## 2012-10-12 DIAGNOSIS — I10 Essential (primary) hypertension: Secondary | ICD-10-CM | POA: Diagnosis not present

## 2012-10-18 ENCOUNTER — Encounter: Payer: Self-pay | Admitting: Internal Medicine

## 2012-10-18 ENCOUNTER — Ambulatory Visit (INDEPENDENT_AMBULATORY_CARE_PROVIDER_SITE_OTHER): Payer: Medicare Other | Admitting: *Deleted

## 2012-10-18 DIAGNOSIS — I255 Ischemic cardiomyopathy: Secondary | ICD-10-CM

## 2012-10-18 DIAGNOSIS — Z95810 Presence of automatic (implantable) cardiac defibrillator: Secondary | ICD-10-CM

## 2012-10-18 DIAGNOSIS — I2589 Other forms of chronic ischemic heart disease: Secondary | ICD-10-CM

## 2012-10-20 DIAGNOSIS — Z0000 Encounter for general adult medical examination without abnormal findings: Secondary | ICD-10-CM | POA: Diagnosis not present

## 2012-10-20 DIAGNOSIS — I10 Essential (primary) hypertension: Secondary | ICD-10-CM | POA: Diagnosis not present

## 2012-10-20 DIAGNOSIS — Z23 Encounter for immunization: Secondary | ICD-10-CM | POA: Diagnosis not present

## 2012-10-20 DIAGNOSIS — E785 Hyperlipidemia, unspecified: Secondary | ICD-10-CM | POA: Diagnosis not present

## 2012-10-20 DIAGNOSIS — Z125 Encounter for screening for malignant neoplasm of prostate: Secondary | ICD-10-CM | POA: Diagnosis not present

## 2012-10-20 DIAGNOSIS — I2510 Atherosclerotic heart disease of native coronary artery without angina pectoris: Secondary | ICD-10-CM | POA: Diagnosis not present

## 2012-10-22 LAB — REMOTE ICD DEVICE
CHARGE TIME: 10.45 s
RV LEAD AMPLITUDE: 10.3 mv
RV LEAD IMPEDENCE ICD: 448 Ohm
TZAT-0001SLOWVT: 1
TZAT-0001SLOWVT: 2
TZAT-0005FASTVT: 88 pct
TZAT-0005FASTVT: 91 pct
TZAT-0005SLOWVT: 84 pct
TZAT-0005SLOWVT: 91 pct
TZAT-0011FASTVT: 10 ms
TZAT-0011FASTVT: 10 ms
TZAT-0012FASTVT: 200 ms
TZAT-0013FASTVT: 2
TZAT-0013FASTVT: 2
TZAT-0013SLOWVT: 3
TZAT-0018FASTVT: NEGATIVE
TZAT-0018FASTVT: NEGATIVE
TZAT-0018SLOWVT: NEGATIVE
TZAT-0018SLOWVT: NEGATIVE
TZAT-0019SLOWVT: 8 V
TZAT-0019SLOWVT: 8 V
TZAT-0020SLOWVT: 1.6 ms
TZAT-0020SLOWVT: 1.6 ms
TZON-0005SLOWVT: 40
TZON-0010AFLUTTER: 70 ms
TZON-0010SLOWVT: 70 ms
TZST-0001FASTVT: 4
TZST-0001FASTVT: 6
TZST-0001SLOWVT: 3
TZST-0001SLOWVT: 5
TZST-0003FASTVT: 35 J
TZST-0003FASTVT: 35 J
TZST-0003SLOWVT: 35 J
TZST-0003SLOWVT: 35 J
VENTRICULAR PACING ICD: 76 pct

## 2012-11-09 DIAGNOSIS — H2510 Age-related nuclear cataract, unspecified eye: Secondary | ICD-10-CM | POA: Diagnosis not present

## 2012-11-23 ENCOUNTER — Encounter: Payer: Self-pay | Admitting: *Deleted

## 2012-12-10 ENCOUNTER — Other Ambulatory Visit: Payer: Self-pay

## 2012-12-10 MED ORDER — GEMFIBROZIL 600 MG PO TABS: 600.0000 mg | ORAL_TABLET | Freq: Two times a day (BID) | ORAL | Status: DC

## 2012-12-29 HISTORY — PX: INSERT / REPLACE / REMOVE PACEMAKER: SUR710

## 2013-01-18 ENCOUNTER — Encounter: Payer: Self-pay | Admitting: Cardiovascular Disease

## 2013-01-18 ENCOUNTER — Ambulatory Visit (INDEPENDENT_AMBULATORY_CARE_PROVIDER_SITE_OTHER): Payer: Medicare Other | Admitting: Cardiovascular Disease

## 2013-01-18 VITALS — BP 110/70 | HR 62 | Ht 71.0 in | Wt 163.8 lb

## 2013-01-18 DIAGNOSIS — I5022 Chronic systolic (congestive) heart failure: Secondary | ICD-10-CM | POA: Diagnosis not present

## 2013-01-18 DIAGNOSIS — I472 Ventricular tachycardia: Secondary | ICD-10-CM | POA: Diagnosis not present

## 2013-01-18 DIAGNOSIS — I509 Heart failure, unspecified: Secondary | ICD-10-CM

## 2013-01-18 DIAGNOSIS — E785 Hyperlipidemia, unspecified: Secondary | ICD-10-CM | POA: Diagnosis not present

## 2013-01-18 NOTE — Assessment & Plan Note (Signed)
[name] has a history of ischemic cardiopathy with chronic systolic congestive heart failure. We'll continue with his same medications. He seems to be doing well. He remains very active and walks on a daily basis.

## 2013-01-18 NOTE — Assessment & Plan Note (Signed)
We'll check fasting lipid profile, hepatic profile, and basic metabolic profile in 6 months.

## 2013-01-18 NOTE — Patient Instructions (Addendum)
Your physician recommends that you return for a FASTING lipid profile: 6 months   Your physician wants you to follow-up in: 6 months  You will receive a reminder letter in the mail two months in advance. If you don't receive a letter, please call our office to schedule the follow-up appointment.  Your physician recommends that you continue on your current medications as directed. Please refer to the Current Medication list given to you today.

## 2013-01-18 NOTE — Progress Notes (Signed)
[name] Date of Birth  05-Aug-1935       [facility]    [employer] [address], [address], [place], [place]  [address]   [place], [place]  [address] [phone]     [phone]   Fax  [phone]    Fax [phone]  Problem List: 1. Coronary artery disease-status post anterior wall myocardial infarction. His subsequent CABG in 1995 2. Congestive heart failure 3. History of ICD 4. Ventricular tachycardia storm-currently controlled on amiodarone 5. Subdural hematoma while on Coumadin 2. Hypothyroidism  History of Present Illness: [name] is a 78 year old gentleman with a history of coronary artery disease. He is status post anterior wall myocardial infarction with subsequent coronary artery bypass grafting in 1995. He has had congestive heart failure. He has had an ICD placed. He was admitted to the hospital last year with an episode of ventricular tachycardia storm. The VT was well controlled on amiodarone.  He has seen Dr. Caryl Comes. The plan is to decrease the amiodarone dose  after he's been stable for another 6 months.  In October 2012 , he had some liver enzyme elevations.   His Lopid was held for a month or so. His liver enzymes returned to normal. The liver ultrasound was negative. He has restarted his Lopid.  He denies any nausea. His appetite is good.  He is now getting back to his normal activities. He has been able to get down to the beach. He is now walking between 2 and 4 miles every day. He does water aerobics on a daily basis. He's not having episodes of chest pain or shortness of breath.  January 18, 2013:  It is doing very well. He's not had any further episodes of chest pain or shortness breath. His ventricular  tachycardia has been well-controlled. He's been quite busy recently. He is looking forward to getting back down on his fishing. He  Current Outpatient Prescriptions on File Prior to Visit  Medication Sig  Dispense Refill  . amiodarone (PACERONE) 200 MG tablet Take 1 tablet (200 mg total) by mouth daily.  30 tablet  5  . aspirin 81 MG tablet Take 81 mg by mouth daily.        . carvedilol (COREG) 6.25 MG tablet TAKE 1 TABLET BY MOUTH TWICE DAILY WITH A MEAL  180 tablet  3  . gemfibrozil (LOPID) 600 MG tablet Take 1 tablet (600 mg total) by mouth 2 (two) times daily before a meal.  60 tablet  6  . levothyroxine (SYNTHROID, LEVOTHROID) 50 MCG tablet Take 50 mcg by mouth daily.        [name] Kitchen lisinopril (PRINIVIL,ZESTRIL) 5 MG tablet TAKE 1 TABLET BY MOUTH DAILY  90 tablet  3  . niacin (SLO-NIACIN) 500 MG tablet Take 2,000 mg by mouth daily.       . nitroGLYCERIN (NITROSTAT) 0.4 MG SL tablet Place 0.4 mg under the tongue every 5 (five) minutes as needed. For chest pain x 3 doses      . spironolactone (ALDACTONE) 25 MG tablet TAKE 1 TABLET BY MOUTH ONCE A DAY .  90 tablet  3  . Tamsulosin HCl (FLOMAX) 0.4 MG CAPS Take 1 tablet by mouth daily.        No Known Allergies  Past Medical History  Diagnosis Date  . Myocardial infarction     1995, non-STEMI January 2012  . Ischemic cardiomyopathy     status post  CABG with patent vein grafts and an atretic LIMA to his ramus, January 2012  . Ventricular tachycardia      VT Storm-January 2012- responded to Amiodarone  . Hypertension   . Ventricular fibrillation   . Chronic systolic congestive heart failure   . Hyperlipidemia   . Encounter for long-term (current) use of other medications     amiodarone  . Atrial arrhythmia/a fibrillation     not on coumadin 2/2 subdural hematoma  . Subdural hematoma     on coumadin  . Automatic implantable cardiac defibrillator in situ     medtronic  . CHF (congestive heart failure)   . Thyroid disease     hypothyroid    Past Surgical History  Procedure Date  . Insert / replace / remove pacemaker     [place] DR [id number]  . Bilateral cranitomies for sub hematomas   . Bypass graft 1995    5  . Coronary  artery bypass graft 1995    5    History  Smoking status  . Never Smoker   Smokeless tobacco  . Former Systems developer    History  Alcohol Use No    No family history on file.  Reviw of Systems:  Reviewed in the HPI.  All other systems are negative.  Physical Exam: Blood pressure 110/70, pulse 62, height 5\' 11"  (1.803 m), weight 163 lb 12.8 oz (74.299 kg), SpO2 98.00%. General: Well developed, well nourished, in no acute distress.  Head: Normocephalic, atraumatic, sclera non-icteric, mucus membranes are moist,   Neck: Supple. Carotids are 2 + without bruits. No JVD  Lungs: Clear bilaterally to auscultation.  Heart: regular rate.  normal  S1 S2. No murmurs, gallops or rubs.  Abdomen: Soft, non-tender, non-distended with normal bowel sounds. No hepatomegaly. No rebound/guarding. No masses.  Msk:  Strength and tone are normal  Extremities: No clubbing or cyanosis. No edema.  Distal pedal pulses are 2+ and equal bilaterally.  Neuro: Alert and oriented X 3. Moves all extremities spontaneously.  Psych:  Responds to questions appropriately with a normal affect.  ECG:  Assessment / Plan:

## 2013-01-18 NOTE — Assessment & Plan Note (Signed)
We'll continue with the current dose of amiodarone.

## 2013-01-24 ENCOUNTER — Ambulatory Visit (INDEPENDENT_AMBULATORY_CARE_PROVIDER_SITE_OTHER): Payer: Medicare Other | Admitting: *Deleted

## 2013-01-24 DIAGNOSIS — I2589 Other forms of chronic ischemic heart disease: Secondary | ICD-10-CM

## 2013-01-24 DIAGNOSIS — Z95810 Presence of automatic (implantable) cardiac defibrillator: Secondary | ICD-10-CM

## 2013-01-24 DIAGNOSIS — I255 Ischemic cardiomyopathy: Secondary | ICD-10-CM

## 2013-01-26 LAB — REMOTE ICD DEVICE
AL AMPLITUDE: 1.7 mv
AL IMPEDENCE ICD: 480 Ohm
MODE SWITCH EPISODES: 64
RV LEAD AMPLITUDE: 9.8 mv
RV LEAD IMPEDENCE ICD: 472 Ohm
TOT-0006: 20131021000000
TZAT-0001FASTVT: 1
TZAT-0001FASTVT: 2
TZAT-0004FASTVT: 8
TZAT-0004FASTVT: 8
TZAT-0004SLOWVT: 8
TZAT-0004SLOWVT: 8
TZAT-0005FASTVT: 88 pct
TZAT-0005FASTVT: 91 pct
TZAT-0005SLOWVT: 91 pct
TZAT-0012SLOWVT: 200 ms
TZAT-0012SLOWVT: 200 ms
TZAT-0013SLOWVT: 3
TZAT-0013SLOWVT: 3
TZAT-0018SLOWVT: NEGATIVE
TZAT-0018SLOWVT: NEGATIVE
TZAT-0020FASTVT: 1.6 ms
TZAT-0020FASTVT: 1.6 ms
TZAT-0020SLOWVT: 1.6 ms
TZAT-0020SLOWVT: 1.6 ms
TZON-0003FASTVT: 230 ms
TZON-0003SLOWVT: 400 ms
TZON-0004SLOWVT: 24
TZON-0005SLOWVT: 40
TZON-0010FASTVT: 70 ms
TZON-0010SLOWVT: 70 ms
TZST-0001FASTVT: 3
TZST-0001FASTVT: 5
TZST-0001FASTVT: 6
TZST-0001SLOWVT: 6
TZST-0003FASTVT: 35 J
TZST-0003FASTVT: 35 J
TZST-0003FASTVT: 35 J
TZST-0003SLOWVT: 35 J
TZST-0003SLOWVT: 35 J
VENTRICULAR PACING ICD: 96 pct

## 2013-02-01 ENCOUNTER — Encounter: Payer: Self-pay | Admitting: *Deleted

## 2013-02-04 ENCOUNTER — Encounter: Payer: Self-pay | Admitting: Internal Medicine

## 2013-02-12 ENCOUNTER — Other Ambulatory Visit: Payer: Self-pay

## 2013-03-02 DIAGNOSIS — H1130 Conjunctival hemorrhage, unspecified eye: Secondary | ICD-10-CM | POA: Diagnosis not present

## 2013-03-18 ENCOUNTER — Encounter: Payer: Self-pay | Admitting: Internal Medicine

## 2013-03-18 ENCOUNTER — Telehealth: Payer: Self-pay | Admitting: Internal Medicine

## 2013-03-18 NOTE — Telephone Encounter (Signed)
New problem    C/O beeping sound from his defib. No one is picking up from the device clinic.

## 2013-03-18 NOTE — Telephone Encounter (Signed)
F/u   Patient wife calling for f/u on this matter, she can be reached at home#

## 2013-03-18 NOTE — Telephone Encounter (Signed)
Patient to send carelink transmission to determine the alert.

## 2013-03-22 ENCOUNTER — Encounter: Payer: Self-pay | Admitting: *Deleted

## 2013-03-22 ENCOUNTER — Encounter: Payer: Self-pay | Admitting: Internal Medicine

## 2013-03-22 ENCOUNTER — Ambulatory Visit (INDEPENDENT_AMBULATORY_CARE_PROVIDER_SITE_OTHER): Payer: Medicare Other | Admitting: Internal Medicine

## 2013-03-22 ENCOUNTER — Encounter (HOSPITAL_COMMUNITY): Payer: Self-pay | Admitting: Respiratory Therapy

## 2013-03-22 VITALS — BP 98/56 | HR 60 | Ht 71.0 in | Wt 163.8 lb

## 2013-03-22 DIAGNOSIS — I2589 Other forms of chronic ischemic heart disease: Secondary | ICD-10-CM | POA: Diagnosis not present

## 2013-03-22 DIAGNOSIS — I472 Ventricular tachycardia: Secondary | ICD-10-CM

## 2013-03-22 DIAGNOSIS — I5022 Chronic systolic (congestive) heart failure: Secondary | ICD-10-CM | POA: Diagnosis not present

## 2013-03-22 DIAGNOSIS — Z95810 Presence of automatic (implantable) cardiac defibrillator: Secondary | ICD-10-CM

## 2013-03-22 DIAGNOSIS — I255 Ischemic cardiomyopathy: Secondary | ICD-10-CM

## 2013-03-22 DIAGNOSIS — I509 Heart failure, unspecified: Secondary | ICD-10-CM | POA: Diagnosis not present

## 2013-03-22 DIAGNOSIS — R7989 Other specified abnormal findings of blood chemistry: Secondary | ICD-10-CM

## 2013-03-22 LAB — BASIC METABOLIC PANEL
CO2: 27 mEq/L (ref 19–32)
Chloride: 105 mEq/L (ref 96–112)
Potassium: 4.7 mEq/L (ref 3.5–5.1)

## 2013-03-22 LAB — CBC WITH DIFFERENTIAL/PLATELET
Basophils Relative: 0.4 % (ref 0.0–3.0)
Eosinophils Relative: 2.5 % (ref 0.0–5.0)
HCT: 44.2 % (ref 39.0–52.0)
Lymphs Abs: 1.2 10*3/uL (ref 0.7–4.0)
MCHC: 33.2 g/dL (ref 30.0–36.0)
MCV: 98 fl (ref 78.0–100.0)
Monocytes Absolute: 0.4 10*3/uL (ref 0.1–1.0)
Neutro Abs: 3.6 10*3/uL (ref 1.4–7.7)
RBC: 4.51 Mil/uL (ref 4.22–5.81)
WBC: 5.4 10*3/uL (ref 4.5–10.5)

## 2013-03-22 LAB — HEPATIC FUNCTION PANEL
ALT: 38 U/L (ref 0–53)
AST: 47 U/L — ABNORMAL HIGH (ref 0–37)
Alkaline Phosphatase: 48 U/L (ref 39–117)
Bilirubin, Direct: 0.2 mg/dL (ref 0.0–0.3)
Total Protein: 6.2 g/dL (ref 6.0–8.3)

## 2013-03-22 MED ORDER — NITROGLYCERIN 0.4 MG SL SUBL: 0.4000 mg | SUBLINGUAL_TABLET | SUBLINGUAL | Status: DC | PRN

## 2013-03-22 NOTE — Assessment & Plan Note (Signed)
We'll continue to follow this chronic issue

## 2013-03-22 NOTE — Progress Notes (Signed)
k[facility] Patient Care Team: Geoffery Lyons, MD as PCP - General (Internal Medicine)   HPI  [name] is a 78 y.o. male Seen in followup for VT in the setting of ischemic cardiomyopathy with prior myocardial infarction, prior bypass surgery in 1995 He has histtory of ventricular tachycardia storm  And he takes amiodarone.  He has a history of reported cardiac arrest and is status post ICD implantation.   He has a history of with recent catheterization (January 2012) demonstrated patent vein grafts with an atretic LIMA to his ramus. Ejection fraction was 15-20%.   He was treated with amiodarone complicated at high dose by nausea and jitteriness. These symptoms have largely abated.   The patient denies chest pain, shortness of breath, nocturnal dyspnea, orthopnea or peripheral edema.  There have been no palpitations, lightheadedness or syncope.       Past Medical History  Diagnosis Date  . Myocardial infarction     1995, non-STEMI January 2012  . Ischemic cardiomyopathy     status post CABG with patent vein grafts and an atretic LIMA to his ramus, January 2012  . Ventricular tachycardia      VT Storm-January 2012- responded to Amiodarone  . Hypertension   . Ventricular fibrillation   . Chronic systolic congestive heart failure   . Hyperlipidemia   . Encounter for long-term (current) use of other medications     amiodarone  . Atrial arrhythmia/a fibrillation     not on coumadin 2/2 subdural hematoma  . Subdural hematoma     on coumadin  . Automatic implantable cardiac defibrillator in situ     medtronic  . CHF (congestive heart failure)   . Thyroid disease     hypothyroid    Past Surgical History  Procedure Laterality Date  . Insert / replace / remove pacemaker      [place] DR [id number]  . Bilateral cranitomies for sub hematomas    . Bypass graft  1995    5  . Coronary artery bypass graft  1995    5    Current Outpatient Prescriptions  Medication Sig  Dispense Refill  . amiodarone (PACERONE) 200 MG tablet Take 1 tablet (200 mg total) by mouth daily.  30 tablet  5  . aspirin 81 MG tablet Take 81 mg by mouth daily.        . carvedilol (COREG) 6.25 MG tablet TAKE 1 TABLET BY MOUTH TWICE DAILY WITH A MEAL  180 tablet  3  . gemfibrozil (LOPID) 600 MG tablet Take 1 tablet (600 mg total) by mouth 2 (two) times daily before a meal.  60 tablet  6  . levothyroxine (SYNTHROID, LEVOTHROID) 50 MCG tablet Take 50 mcg by mouth daily.        [name] Kitchen lisinopril (PRINIVIL,ZESTRIL) 5 MG tablet TAKE 1 TABLET BY MOUTH DAILY  90 tablet  3  . niacin (SLO-NIACIN) 500 MG tablet Take 2,000 mg by mouth daily.       . nitroGLYCERIN (NITROSTAT) 0.4 MG SL tablet Place 0.4 mg under the tongue every 5 (five) minutes as needed. For chest pain x 3 doses      . spironolactone (ALDACTONE) 25 MG tablet TAKE 1 TABLET BY MOUTH ONCE A DAY .  90 tablet  3  . Tamsulosin HCl (FLOMAX) 0.4 MG CAPS Take 1 tablet by mouth daily.       No current facility-administered medications for this visit.    No Known Allergies  Review of Systems  negative except from HPI and PMH  Physical Exam BP 98/56  Pulse 60  Ht 5\' 11"  (1.803 m)  Wt 163 lb 12.8 oz (74.299 kg)  BMI 22.86 kg/m2 Well developed and well nourished in no acute distress HENT normal E scleral and icterus clear Neck Supple JVP flat; carotids brisk and full Clear to ausculation  Device pocket well healed; without hematoma or erythema *Regular rate and rhythm, no murmurs gallops or rub Soft with active bowel sounds No clubbing cyanosis none Edema Alert and oriented, grossly normal motor and sensory function Skin Warm and Dry  ECG from 7/13 demonstrated sinus rhythm with right bundle branch block  Assessment and  Plan

## 2013-03-22 NOTE — Assessment & Plan Note (Addendum)
Recurrent ventricular tachycardia; has been quiet now for about 2 years. We will decrease his amiodarone a little bit to 200 mg 5 days a week; he will need amiodarone surveillance laboratories

## 2013-03-22 NOTE — Patient Instructions (Signed)
Your physician has recommended that you have a defibrillator generator change.  Please see the instruction sheet given to you today for more information.  Your physician has recommended you make the following change in your medication:  1) Decrease amiodarone to one tablet 5 days a week.  Your physician recommends that you have lab work today: bmp/cbc/inr/liver/tsh

## 2013-03-22 NOTE — Assessment & Plan Note (Signed)
Euvolemic. Will continue current medications

## 2013-03-22 NOTE — Assessment & Plan Note (Signed)
Stable symptoms. Catheterization about 2 years ago was also stable. Will not undertake preprocedure Myoview

## 2013-03-22 NOTE — Assessment & Plan Note (Signed)
The patient defibrillator has reached ERI. This will be his fourth device. We have discussed risks and benefits of device replacement including the fracture and infection.Lead  parameters are currently stable; we will anticipate using a antimicrobial dissolvable pouch

## 2013-03-27 MED ORDER — SODIUM CHLORIDE 0.9 % IR SOLN
80.0000 mg | Status: DC
  Filled 2013-03-27: qty 2

## 2013-03-27 MED ORDER — CEFAZOLIN SODIUM-DEXTROSE 2-3 GM-% IV SOLR
2.0000 g | INTRAVENOUS | Status: DC
  Filled 2013-03-27: qty 50

## 2013-03-28 ENCOUNTER — Ambulatory Visit (HOSPITAL_COMMUNITY)
Admission: RE | Admit: 2013-03-28 | Discharge: 2013-03-28 | Disposition: A | Discharge status: Home / Self Care | Payer: Medicare Other | Source: Ambulatory Visit | Attending: Internal Medicine | Admitting: Internal Medicine

## 2013-03-28 ENCOUNTER — Ambulatory Visit (HOSPITAL_COMMUNITY): Payer: Medicare Other

## 2013-03-28 ENCOUNTER — Encounter (HOSPITAL_COMMUNITY)
Admission: RE | Disposition: A | Discharge status: Home / Self Care | Payer: Self-pay | Source: Ambulatory Visit | Attending: Internal Medicine

## 2013-03-28 DIAGNOSIS — Z950 Presence of cardiac pacemaker: Secondary | ICD-10-CM | POA: Diagnosis not present

## 2013-03-28 DIAGNOSIS — Z95810 Presence of automatic (implantable) cardiac defibrillator: Secondary | ICD-10-CM

## 2013-03-28 DIAGNOSIS — J984 Other disorders of lung: Secondary | ICD-10-CM | POA: Diagnosis not present

## 2013-03-28 DIAGNOSIS — I2589 Other forms of chronic ischemic heart disease: Secondary | ICD-10-CM | POA: Diagnosis not present

## 2013-03-28 DIAGNOSIS — Z4502 Encounter for adjustment and management of automatic implantable cardiac defibrillator: Secondary | ICD-10-CM | POA: Diagnosis not present

## 2013-03-28 DIAGNOSIS — I255 Ischemic cardiomyopathy: Secondary | ICD-10-CM

## 2013-03-28 DIAGNOSIS — I472 Ventricular tachycardia: Secondary | ICD-10-CM

## 2013-03-28 DIAGNOSIS — I5022 Chronic systolic (congestive) heart failure: Secondary | ICD-10-CM

## 2013-03-28 HISTORY — PX: IMPLANTABLE CARDIOVERTER DEFIBRILLATOR GENERATOR CHANGE: SHX5474

## 2013-03-28 LAB — SURGICAL PCR SCREEN
MRSA, PCR: NEGATIVE
Staphylococcus aureus: POSITIVE — AB

## 2013-03-28 SURGERY — IMPLANTABLE CARDIOVERTER DEFIBRILLATOR GENERATOR CHANGE
Anesthesia: LOCAL

## 2013-03-28 MED ORDER — SODIUM CHLORIDE 0.9 % IV SOLN: INTRAVENOUS | Status: AC

## 2013-03-28 MED ORDER — CHLORHEXIDINE GLUCONATE 4 % EX LIQD
60.0000 mL | Freq: Once | CUTANEOUS | Status: DC
  Filled 2013-03-28: qty 60

## 2013-03-28 MED ORDER — FENTANYL CITRATE 0.05 MG/ML IJ SOLN
INTRAMUSCULAR | Status: AC
  Filled 2013-03-28: qty 2

## 2013-03-28 MED ORDER — MUPIROCIN 2 % EX OINT
TOPICAL_OINTMENT | CUTANEOUS | Status: AC
  Filled 2013-03-28: qty 22

## 2013-03-28 MED ORDER — MIDAZOLAM HCL 5 MG/5ML IJ SOLN
INTRAMUSCULAR | Status: AC
  Filled 2013-03-28: qty 5

## 2013-03-28 MED ORDER — ONDANSETRON HCL 4 MG/2ML IJ SOLN: 4.0000 mg | Freq: Four times a day (QID) | INTRAMUSCULAR | Status: DC | PRN

## 2013-03-28 MED ORDER — ACETAMINOPHEN 325 MG PO TABS
325.0000 mg | ORAL_TABLET | ORAL | Status: DC | PRN
  Filled 2013-03-28: qty 2

## 2013-03-28 MED ORDER — LIDOCAINE HCL (PF) 1 % IJ SOLN
INTRAMUSCULAR | Status: AC
  Filled 2013-03-28: qty 60

## 2013-03-28 MED ORDER — SODIUM CHLORIDE 0.9 % IV SOLN
INTRAVENOUS | Status: DC
  Administered 2013-03-28: 07:00:00 via INTRAVENOUS

## 2013-03-28 MED ORDER — MUPIROCIN 2 % EX OINT
TOPICAL_OINTMENT | Freq: Two times a day (BID) | CUTANEOUS | Status: DC
  Administered 2013-03-28: 07:00:00 via NASAL
  Filled 2013-03-28: qty 22

## 2013-03-28 NOTE — H&P (View-Only) (Signed)
[facility] Patient Care Team: Geoffery Lyons, MD as PCP - General (Internal Medicine)   HPI  [name] is a 78 y.o. male Seen in followup for VT in the setting of ischemic cardiomyopathy with prior myocardial infarction, prior bypass surgery in 1995 He has histtory of ventricular tachycardia storm  And he takes amiodarone.  He has a history of reported cardiac arrest and is status post ICD implantation.   He has a history of with recent catheterization (January 2012) demonstrated patent vein grafts with an atretic LIMA to his ramus. Ejection fraction was 15-20%.   He was treated with amiodarone complicated at high dose by nausea and jitteriness. These symptoms have largely abated.   The patient denies chest pain, shortness of breath, nocturnal dyspnea, orthopnea or peripheral edema.  There have been no palpitations, lightheadedness or syncope.       Past Medical History  Diagnosis Date  . Myocardial infarction     1995, non-STEMI January 2012  . Ischemic cardiomyopathy     status post CABG with patent vein grafts and an atretic LIMA to his ramus, January 2012  . Ventricular tachycardia      VT Storm-January 2012- responded to Amiodarone  . Hypertension   . Ventricular fibrillation   . Chronic systolic congestive heart failure   . Hyperlipidemia   . Encounter for long-term (current) use of other medications     amiodarone  . Atrial arrhythmia/a fibrillation     not on coumadin 2/2 subdural hematoma  . Subdural hematoma     on coumadin  . Automatic implantable cardiac defibrillator in situ     medtronic  . CHF (congestive heart failure)   . Thyroid disease     hypothyroid    Past Surgical History  Procedure Laterality Date  . Insert / replace / remove pacemaker      [place] DR [id number]  . Bilateral cranitomies for sub hematomas    . Bypass graft  1995    5  . Coronary artery bypass graft  1995    5    Current Outpatient Prescriptions  Medication Sig  Dispense Refill  . amiodarone (PACERONE) 200 MG tablet Take 1 tablet (200 mg total) by mouth daily.  30 tablet  5  . aspirin 81 MG tablet Take 81 mg by mouth daily.        . carvedilol (COREG) 6.25 MG tablet TAKE 1 TABLET BY MOUTH TWICE DAILY WITH A MEAL  180 tablet  3  . gemfibrozil (LOPID) 600 MG tablet Take 1 tablet (600 mg total) by mouth 2 (two) times daily before a meal.  60 tablet  6  . levothyroxine (SYNTHROID, LEVOTHROID) 50 MCG tablet Take 50 mcg by mouth daily.        [name] Kitchen lisinopril (PRINIVIL,ZESTRIL) 5 MG tablet TAKE 1 TABLET BY MOUTH DAILY  90 tablet  3  . niacin (SLO-NIACIN) 500 MG tablet Take 2,000 mg by mouth daily.       . nitroGLYCERIN (NITROSTAT) 0.4 MG SL tablet Place 0.4 mg under the tongue every 5 (five) minutes as needed. For chest pain x 3 doses      . spironolactone (ALDACTONE) 25 MG tablet TAKE 1 TABLET BY MOUTH ONCE A DAY .  90 tablet  3  . Tamsulosin HCl (FLOMAX) 0.4 MG CAPS Take 1 tablet by mouth daily.       No current facility-administered medications for this visit.    No Known Allergies  Review of Systems  negative except from HPI and PMH  Physical Exam BP 98/56  Pulse 60  Ht 5\' 11"  (1.803 m)  Wt 163 lb 12.8 oz (74.299 kg)  BMI 22.86 kg/m2 Well developed and well nourished in no acute distress HENT normal E scleral and icterus clear Neck Supple JVP flat; carotids brisk and full Clear to ausculation  Device pocket well healed; without hematoma or erythema *Regular rate and rhythm, no murmurs gallops or rub Soft with active bowel sounds No clubbing cyanosis none Edema Alert and oriented, grossly normal motor and sensory function Skin Warm and Dry  ECG from 7/13 demonstrated sinus rhythm with right bundle branch block  Assessment and  Plan

## 2013-03-28 NOTE — CV Procedure (Signed)
Preoperative diagnosis  Ischemic cardiomyopathy, prev device at [facility]  Postoperative diagnosis same/   Procedure: Generator replacement   And lead testin; pocket revision  Following informed consent the patient was brought to the electrophysiology laboratory in place of the fluoroscopic table in the supine position after routine prep and drape lidocaine was infiltrated in the region of the previous incision and carried down to later the device pocket using sharp dissection and electrocautery. The pocket was opened the device was freed up and was explanted.  Interrogation of the previously implanted ventricular lead Medtronic [phone]   demonstrated an R wave of 8  millivolts., and impedance of 509 ohms, and a pacing threshold of 0.7 volts at 0.5 msec.  High voltage coils pacing impedance mid 400s  The previously implanted atrial lead Medtronic [id number]  demonstrated a P-wave amplitude of 3.2 milllivolts  and impedance of  621 ohms, and a pacing threshold of 0.7 volts at @ 0.5 milliseconds.  The leads were inspected. The leads were then attached to a Medtronic  pulse generator, serial number [facility] [id number]  reassessement of leads  R wave 8.3/456/0.75@0 .4 and atrial lead 1.9/399/1@0 .4.  HV impedance 38/52  Pocket revised to allow for use of AEGIS resobable antimicriobial pouch.  surgicell was placed in pocket    The pocket was irrigated with antibiotic containing saline solution hemostasis was assured and the leads and the device were placed in the pocket. The wound was then closed in 2 layers in normal fashion.  The patient tolerated the procedure without apparent complication.  Virl Axe

## 2013-03-28 NOTE — Interval H&P Note (Signed)
History and Physical Interval Note:  03/28/2013 7:24 AM  [name]  has presented today for surgery, with the diagnosis of eri  The various methods of treatment have been discussed with the patient and family. After consideration of risks, benefits and other options for treatment, the patient has consented to  Procedure(s): IMPLANTABLE CARDIOVERTER DEFIBRILLATOR GENERATOR CHANGE (N/A) as a surgical intervention .  The patient's history has been reviewed, patient examined, no change in status, stable for surgery.  I have reviewed the patient's chart and labs.  Questions were answered to the patient's satisfaction.     Virl Axe

## 2013-04-01 ENCOUNTER — Other Ambulatory Visit: Payer: Self-pay | Admitting: *Deleted

## 2013-04-01 MED ORDER — CARVEDILOL 6.25 MG PO TABS: 6.2500 mg | ORAL_TABLET | Freq: Two times a day (BID) | ORAL | Status: DC

## 2013-04-01 NOTE — Telephone Encounter (Signed)
Fax Received. Refill Completed. [name] (R.M.A)   

## 2013-04-05 ENCOUNTER — Other Ambulatory Visit: Payer: Self-pay | Admitting: *Deleted

## 2013-04-05 DIAGNOSIS — I5022 Chronic systolic (congestive) heart failure: Secondary | ICD-10-CM

## 2013-04-05 DIAGNOSIS — I472 Ventricular tachycardia: Secondary | ICD-10-CM

## 2013-04-05 DIAGNOSIS — I255 Ischemic cardiomyopathy: Secondary | ICD-10-CM

## 2013-04-05 MED ORDER — AMIODARONE HCL 200 MG PO TABS: 200.0000 mg | ORAL_TABLET | ORAL | Status: DC

## 2013-04-05 NOTE — Telephone Encounter (Signed)
Fax Received. Refill Completed. Maricus Tanzi Chowoe (R.M.A)   

## 2013-04-06 ENCOUNTER — Other Ambulatory Visit: Payer: Self-pay | Admitting: *Deleted

## 2013-04-07 ENCOUNTER — Ambulatory Visit (INDEPENDENT_AMBULATORY_CARE_PROVIDER_SITE_OTHER): Payer: Medicare Other | Admitting: *Deleted

## 2013-04-07 ENCOUNTER — Other Ambulatory Visit: Payer: Self-pay

## 2013-04-07 ENCOUNTER — Telehealth: Payer: Self-pay | Admitting: Internal Medicine

## 2013-04-07 ENCOUNTER — Encounter: Payer: Self-pay | Admitting: Internal Medicine

## 2013-04-07 DIAGNOSIS — I509 Heart failure, unspecified: Secondary | ICD-10-CM

## 2013-04-07 DIAGNOSIS — I4901 Ventricular fibrillation: Secondary | ICD-10-CM

## 2013-04-07 DIAGNOSIS — I5022 Chronic systolic (congestive) heart failure: Secondary | ICD-10-CM

## 2013-04-07 DIAGNOSIS — I2589 Other forms of chronic ischemic heart disease: Secondary | ICD-10-CM | POA: Diagnosis not present

## 2013-04-07 DIAGNOSIS — I255 Ischemic cardiomyopathy: Secondary | ICD-10-CM

## 2013-04-07 LAB — ICD DEVICE OBSERVATION
AL AMPLITUDE: 2.3 mv
AL AMPLITUDE: 2.3 mv
AL AMPLITUDE: 2.3 mv
AL IMPEDENCE ICD: 380 Ohm
AL IMPEDENCE ICD: 380 Ohm
AL THRESHOLD: 0.75 V
AL THRESHOLD: 0.75 V
ATRIAL PACING ICD: 96.6 pct
DEV-0020ICD: NEGATIVE
RV LEAD AMPLITUDE: 7.9 mv
RV LEAD AMPLITUDE: 7.9 mv
RV LEAD IMPEDENCE ICD: 456 Ohm
RV LEAD IMPEDENCE ICD: 456 Ohm
RV LEAD THRESHOLD: 0.75 V
RV LEAD THRESHOLD: 0.75 V
VENTRICULAR PACING ICD: 2.5 pct

## 2013-04-07 NOTE — Progress Notes (Signed)
Wound check-ICD 

## 2013-04-07 NOTE — Telephone Encounter (Signed)
Spoke with pt and not feeling well with changes made to device. Pt scheduled to come in at 1400 to change back.

## 2013-04-07 NOTE — Telephone Encounter (Signed)
New problem   Having problems with defib from this morning-pt now feeling tired when he walks

## 2013-04-11 DIAGNOSIS — M1990 Unspecified osteoarthritis, unspecified site: Secondary | ICD-10-CM | POA: Diagnosis not present

## 2013-04-11 DIAGNOSIS — I2510 Atherosclerotic heart disease of native coronary artery without angina pectoris: Secondary | ICD-10-CM | POA: Diagnosis not present

## 2013-04-11 DIAGNOSIS — E785 Hyperlipidemia, unspecified: Secondary | ICD-10-CM | POA: Diagnosis not present

## 2013-04-11 DIAGNOSIS — I10 Essential (primary) hypertension: Secondary | ICD-10-CM | POA: Diagnosis not present

## 2013-04-11 DIAGNOSIS — I499 Cardiac arrhythmia, unspecified: Secondary | ICD-10-CM | POA: Diagnosis not present

## 2013-04-11 DIAGNOSIS — IMO0002 Reserved for concepts with insufficient information to code with codable children: Secondary | ICD-10-CM | POA: Diagnosis not present

## 2013-04-11 DIAGNOSIS — Z950 Presence of cardiac pacemaker: Secondary | ICD-10-CM | POA: Diagnosis not present

## 2013-06-20 ENCOUNTER — Other Ambulatory Visit: Payer: Self-pay | Admitting: *Deleted

## 2013-06-20 MED ORDER — GEMFIBROZIL 600 MG PO TABS: 600.0000 mg | ORAL_TABLET | Freq: Two times a day (BID) | ORAL | Status: DC

## 2013-06-20 NOTE — Telephone Encounter (Signed)
Fax Received. Refill Completed. Vaiden Adames Chowoe (R.M.A)   

## 2013-07-19 ENCOUNTER — Encounter: Payer: Self-pay | Admitting: Cardiovascular Disease

## 2013-07-19 ENCOUNTER — Other Ambulatory Visit: Payer: Self-pay | Admitting: Cardiovascular Disease

## 2013-07-27 ENCOUNTER — Encounter: Payer: Medicare Other | Admitting: *Deleted

## 2013-07-27 ENCOUNTER — Ambulatory Visit (INDEPENDENT_AMBULATORY_CARE_PROVIDER_SITE_OTHER): Payer: Medicare Other | Admitting: *Deleted

## 2013-07-27 DIAGNOSIS — I5022 Chronic systolic (congestive) heart failure: Secondary | ICD-10-CM

## 2013-07-27 DIAGNOSIS — I2589 Other forms of chronic ischemic heart disease: Secondary | ICD-10-CM | POA: Diagnosis not present

## 2013-07-27 DIAGNOSIS — I509 Heart failure, unspecified: Secondary | ICD-10-CM | POA: Diagnosis not present

## 2013-07-27 DIAGNOSIS — Z95810 Presence of automatic (implantable) cardiac defibrillator: Secondary | ICD-10-CM | POA: Diagnosis not present

## 2013-07-27 DIAGNOSIS — I255 Ischemic cardiomyopathy: Secondary | ICD-10-CM

## 2013-07-28 ENCOUNTER — Ambulatory Visit (INDEPENDENT_AMBULATORY_CARE_PROVIDER_SITE_OTHER): Payer: Medicare Other | Admitting: Cardiovascular Disease

## 2013-07-28 ENCOUNTER — Encounter: Payer: Self-pay | Admitting: Cardiovascular Disease

## 2013-07-28 VITALS — BP 102/58 | HR 88 | Ht 71.0 in | Wt 160.0 lb

## 2013-07-28 DIAGNOSIS — I5022 Chronic systolic (congestive) heart failure: Secondary | ICD-10-CM | POA: Diagnosis not present

## 2013-07-28 DIAGNOSIS — I509 Heart failure, unspecified: Secondary | ICD-10-CM | POA: Diagnosis not present

## 2013-07-28 DIAGNOSIS — E785 Hyperlipidemia, unspecified: Secondary | ICD-10-CM

## 2013-07-28 DIAGNOSIS — R634 Abnormal weight loss: Secondary | ICD-10-CM

## 2013-07-28 LAB — HEPATIC FUNCTION PANEL
Albumin: 3.4 g/dL — ABNORMAL LOW (ref 3.5–5.2)
Alkaline Phosphatase: 46 U/L (ref 39–117)

## 2013-07-28 LAB — LIPID PANEL
HDL: 50 mg/dL (ref >39.00)
Total CHOL/HDL Ratio: 3
VLDL: 7 mg/dL (ref 0.0–40.0)

## 2013-07-28 LAB — BASIC METABOLIC PANEL
BUN: 14 mg/dL (ref 6–23)
CO2: 27 mEq/L (ref 19–32)
Calcium: 8.8 mg/dL (ref 8.4–10.5)
Glucose, Bld: 102 mg/dL — ABNORMAL HIGH (ref 70–99)
Sodium: 137 mEq/L (ref 135–145)

## 2013-07-28 LAB — TSH: TSH: 3.11 u[IU]/mL (ref 0.35–5.50)

## 2013-07-28 NOTE — Assessment & Plan Note (Signed)
The patient has chronic systolic congestive heart failure. His symptoms are fairly well-controlled. Continue with same medications.

## 2013-07-28 NOTE — Progress Notes (Signed)
[name] Date of Birth  02/06/1935       [facility]    [employer] [address], [place],   [address]   [place],   [address] [phone]     [phone]   Fax  [phone]    Fax [phone]  Problem List: 1. Coronary artery disease-status post anterior wall myocardial infarction. His subsequent CABG in 1995 2. Congestive heart failure 3. History of ICD 4. Ventricular tachycardia storm-currently controlled on amiodarone 5. Subdural hematoma while on Coumadin 2. Hypothyroidism  History of Present Illness: [name] is a 78 year old gentleman with a history of coronary artery disease. He is status post anterior wall myocardial infarction with subsequent coronary artery bypass grafting in 1995. He has had congestive heart failure. He has had an ICD placed. He was admitted to the hospital last year with an episode of ventricular tachycardia storm. The VT was well controlled on amiodarone.  He has seen Dr. Caryl Comes. The plan is to decrease the amiodarone dose  after he's been stable for another 6 months.  In October 2012 , he had some liver enzyme elevations.   His Lopid was held for a month or so. His liver enzymes returned to normal. The liver ultrasound was negative. He has restarted his Lopid.  He denies any nausea. His appetite is good.  He is now getting back to his normal activities. He has been able to get down to the beach. He is now walking between 2 and 4 miles every day. He does water aerobics on a daily basis. He's not having episodes of chest pain or shortness of breath.  January 18, 2013:  It is doing very well. He's not had any further episodes of chest pain or shortness breath. His ventricular  tachycardia has been well-controlled. He's been quite busy recently. He is looking forward to getting back down on his fishing.  July 28, 2013:  [name] remains active.  Walks regularly , no further episodes  of VT.    No angina.  Would like to gain a little weight.  He's slowly but steadily lost weight over the summer.  We will check a TSH today.  Current Outpatient Prescriptions on File Prior to Visit  Medication Sig Dispense Refill  . amiodarone (PACERONE) 200 MG tablet Take 1 tablet (200 mg total) by mouth See admin instructions. Take one tablet by mouth 5 days a week  30 tablet  5  . aspirin 81 MG tablet Take 81 mg by mouth daily.        . carvedilol (COREG) 6.25 MG tablet Take 1 tablet (6.25 mg total) by mouth 2 (two) times daily with a meal.  180 tablet  3  . gemfibrozil (LOPID) 600 MG tablet Take 1 tablet (600 mg total) by mouth 2 (two) times daily before a meal.  60 tablet  6  . levothyroxine (SYNTHROID, LEVOTHROID) 50 MCG tablet Take 50 mcg by mouth daily.        [name] Kitchen lisinopril (PRINIVIL,ZESTRIL) 5 MG tablet Take 5 mg by mouth daily.      . niacin (SLO-NIACIN) 500 MG tablet Take 2,000 mg by mouth daily.       . nitroGLYCERIN (NITROSTAT) 0.4 MG SL tablet Place 1 tablet (0.4 mg total) under the tongue every 5 (five) minutes as needed for chest pain. For chest pain x 3 doses  25 tablet  3  . spironolactone (ALDACTONE) 25 MG  tablet Take 25 mg by mouth daily.      . Tamsulosin HCl (FLOMAX) 0.4 MG CAPS Take 1 tablet by mouth daily.       No current facility-administered medications on file prior to visit.    No Known Allergies  Past Medical History  Diagnosis Date  . Myocardial infarction     1995, non-STEMI January 2012  . Ischemic cardiomyopathy     status post CABG with patent vein grafts and an atretic LIMA to his ramus, January 2012  . Ventricular tachycardia      VT Storm-January 2012- responded to Amiodarone  . Hypertension   . Ventricular fibrillation   . Chronic systolic congestive heart failure   . Hyperlipidemia   . Encounter for long-term (current) use of other medications     amiodarone  . Atrial arrhythmia/a fibrillation     not on coumadin 2/2 subdural hematoma  .  Subdural hematoma     on coumadin  . Automatic implantable cardiac defibrillator in situ     medtronic  . CHF (congestive heart failure)   . Thyroid disease     hypothyroid    Past Surgical History  Procedure Laterality Date  . Insert / replace / remove pacemaker      [place] DR [id number]  . Bilateral cranitomies for sub hematomas    . Bypass graft  1995    5  . Coronary artery bypass graft  1995    5    History  Smoking status  . Never Smoker   Smokeless tobacco  . Former Systems developer    History  Alcohol Use No    No family history on file.  Reviw of Systems:  Reviewed in the HPI.  All other systems are negative.  Physical Exam: Blood pressure 102/58, pulse 88, height 5\' 11"  (1.803 m), weight 160 lb (72.576 kg). General: Well developed, well nourished, in no acute distress.  Head: Normocephalic, atraumatic, sclera non-icteric, mucus membranes are moist,   Neck: Supple. Carotids are 2 + without bruits. No JVD  Lungs: Clear bilaterally to auscultation.  Heart: regular rate.  normal  S1 S2. No murmurs, gallops or rubs.  Abdomen: Soft, non-tender, non-distended with normal bowel sounds. No hepatomegaly. No rebound/guarding. No masses.  Msk:  Strength and tone are normal  Extremities: No clubbing or cyanosis. No edema.  Distal pedal pulses are 2+ and equal bilaterally.  Neuro: Alert and oriented X 3. Moves all extremities spontaneously.  Psych:  Responds to questions appropriately with a normal affect.  ECG:  Assessment / Plan:

## 2013-07-28 NOTE — Assessment & Plan Note (Signed)
We'll check fasting labs today.  We'll also check a TSH.

## 2013-07-28 NOTE — Patient Instructions (Addendum)
Your physician recommends that you return for lab work in: today  Your physician wants you to follow-up in: 6 months  You will receive a reminder letter in the mail two months in advance. If you don't receive a letter, please call our office to schedule the follow-up appointment.  Your physician recommends that you continue on your current medications as directed. Please refer to the Current Medication list given to you today.

## 2013-07-28 NOTE — Assessment & Plan Note (Signed)
[name] has lost some weight over the summer. He thinks it may be that he is exercising a bit too much. His appetite is fairly normal. Check a TSH to make sure that that is still normal.

## 2013-08-03 ENCOUNTER — Other Ambulatory Visit: Payer: Self-pay

## 2013-08-03 LAB — REMOTE ICD DEVICE
ATRIAL PACING ICD: 95.46 pct
BAMS-0001: 170 {beats}/min
CHARGE TIME: 3.973 s
PACEART VT: 0
RV LEAD AMPLITUDE: 10.3 mv
RV LEAD IMPEDENCE ICD: 437 Ohm
TOT-0001: 0
TOT-0002: 0
TZAT-0001ATACH: 2
TZAT-0001ATACH: 3
TZAT-0001FASTVT: 1
TZAT-0001FASTVT: 2
TZAT-0001SLOWVT: 2
TZAT-0002ATACH: NEGATIVE
TZAT-0004FASTVT: 8
TZAT-0004FASTVT: 8
TZAT-0004SLOWVT: 8
TZAT-0004SLOWVT: 8
TZAT-0005SLOWVT: 88 pct
TZAT-0005SLOWVT: 91 pct
TZAT-0012ATACH: 150 ms
TZAT-0012ATACH: 150 ms
TZAT-0012ATACH: 150 ms
TZAT-0012FASTVT: 180 ms
TZAT-0012FASTVT: 180 ms
TZAT-0012SLOWVT: 180 ms
TZAT-0013FASTVT: 2
TZAT-0013FASTVT: 2
TZAT-0018ATACH: NEGATIVE
TZAT-0018FASTVT: NEGATIVE
TZAT-0018FASTVT: NEGATIVE
TZAT-0019ATACH: 6 V
TZAT-0019FASTVT: 8 V
TZAT-0019FASTVT: 8 V
TZAT-0020ATACH: 1.5 ms
TZAT-0020FASTVT: 1.5 ms
TZAT-0020FASTVT: 1.5 ms
TZAT-0020SLOWVT: 1.5 ms
TZAT-0020SLOWVT: 1.5 ms
TZON-0003ATACH: 350 ms
TZON-0003SLOWVT: 400 ms
TZON-0003VSLOWVT: 450 ms
TZON-0004SLOWVT: 24
TZON-0004VSLOWVT: 28
TZST-0001ATACH: 4
TZST-0001ATACH: 6
TZST-0001FASTVT: 3
TZST-0001FASTVT: 5
TZST-0001SLOWVT: 3
TZST-0001SLOWVT: 5
TZST-0001SLOWVT: 6
TZST-0003FASTVT: 35 J
TZST-0003FASTVT: 35 J
TZST-0003SLOWVT: 25 J
TZST-0003SLOWVT: 35 J

## 2013-08-15 ENCOUNTER — Encounter: Payer: Self-pay | Admitting: Internal Medicine

## 2013-09-20 ENCOUNTER — Other Ambulatory Visit: Payer: Self-pay | Admitting: Cardiovascular Disease

## 2013-10-17 DIAGNOSIS — I10 Essential (primary) hypertension: Secondary | ICD-10-CM | POA: Diagnosis not present

## 2013-10-17 DIAGNOSIS — Z125 Encounter for screening for malignant neoplasm of prostate: Secondary | ICD-10-CM | POA: Diagnosis not present

## 2013-10-17 DIAGNOSIS — R82998 Other abnormal findings in urine: Secondary | ICD-10-CM | POA: Diagnosis not present

## 2013-10-24 DIAGNOSIS — Z1331 Encounter for screening for depression: Secondary | ICD-10-CM | POA: Diagnosis not present

## 2013-10-24 DIAGNOSIS — I499 Cardiac arrhythmia, unspecified: Secondary | ICD-10-CM | POA: Diagnosis not present

## 2013-10-24 DIAGNOSIS — I2510 Atherosclerotic heart disease of native coronary artery without angina pectoris: Secondary | ICD-10-CM | POA: Diagnosis not present

## 2013-10-24 DIAGNOSIS — Z0000 Encounter for general adult medical examination without abnormal findings: Secondary | ICD-10-CM | POA: Diagnosis not present

## 2013-10-24 DIAGNOSIS — I498 Other specified cardiac arrhythmias: Secondary | ICD-10-CM | POA: Diagnosis not present

## 2013-10-24 DIAGNOSIS — Z23 Encounter for immunization: Secondary | ICD-10-CM | POA: Diagnosis not present

## 2013-10-24 DIAGNOSIS — E785 Hyperlipidemia, unspecified: Secondary | ICD-10-CM | POA: Diagnosis not present

## 2013-10-24 DIAGNOSIS — I10 Essential (primary) hypertension: Secondary | ICD-10-CM | POA: Diagnosis not present

## 2013-10-24 DIAGNOSIS — Z1212 Encounter for screening for malignant neoplasm of rectum: Secondary | ICD-10-CM | POA: Diagnosis not present

## 2013-10-24 DIAGNOSIS — IMO0002 Reserved for concepts with insufficient information to code with codable children: Secondary | ICD-10-CM | POA: Diagnosis not present

## 2013-10-24 DIAGNOSIS — M1990 Unspecified osteoarthritis, unspecified site: Secondary | ICD-10-CM | POA: Diagnosis not present

## 2013-10-24 DIAGNOSIS — N401 Enlarged prostate with lower urinary tract symptoms: Secondary | ICD-10-CM | POA: Diagnosis not present

## 2013-10-28 DIAGNOSIS — L570 Actinic keratosis: Secondary | ICD-10-CM | POA: Diagnosis not present

## 2013-10-28 DIAGNOSIS — D235 Other benign neoplasm of skin of trunk: Secondary | ICD-10-CM | POA: Diagnosis not present

## 2013-10-31 ENCOUNTER — Ambulatory Visit (INDEPENDENT_AMBULATORY_CARE_PROVIDER_SITE_OTHER): Payer: Medicare Other | Admitting: *Deleted

## 2013-10-31 DIAGNOSIS — I5022 Chronic systolic (congestive) heart failure: Secondary | ICD-10-CM

## 2013-10-31 DIAGNOSIS — I509 Heart failure, unspecified: Secondary | ICD-10-CM

## 2013-10-31 DIAGNOSIS — I2589 Other forms of chronic ischemic heart disease: Secondary | ICD-10-CM | POA: Diagnosis not present

## 2013-10-31 DIAGNOSIS — I472 Ventricular tachycardia: Secondary | ICD-10-CM

## 2013-10-31 DIAGNOSIS — Z95810 Presence of automatic (implantable) cardiac defibrillator: Secondary | ICD-10-CM | POA: Diagnosis not present

## 2013-10-31 DIAGNOSIS — I255 Ischemic cardiomyopathy: Secondary | ICD-10-CM

## 2013-11-03 ENCOUNTER — Other Ambulatory Visit: Payer: Self-pay

## 2013-11-04 DIAGNOSIS — Z1212 Encounter for screening for malignant neoplasm of rectum: Secondary | ICD-10-CM | POA: Diagnosis not present

## 2013-11-08 ENCOUNTER — Encounter: Payer: Self-pay | Admitting: Internal Medicine

## 2013-11-08 LAB — MDC_IDC_ENUM_SESS_TYPE_REMOTE
Battery Remaining Longevity: 107 mo
Brady Statistic RA Percent Paced: 93.72 %
Brady Statistic RV Percent Paced: 17.63 %
Date Time Interrogation Session: 20141111172104
Lead Channel Impedance Value: 380 Ohm
Lead Channel Impedance Value: 513 Ohm
Lead Channel Pacing Threshold Pulse Width: 0.4 ms
Lead Channel Sensing Intrinsic Amplitude: 0.625 mV
Lead Channel Setting Sensing Sensitivity: 0.3 mV
Zone Setting Detection Interval: 230 ms
Zone Setting Detection Interval: 300 ms
Zone Setting Detection Interval: 450 ms

## 2013-12-06 ENCOUNTER — Encounter: Payer: Self-pay | Admitting: *Deleted

## 2013-12-06 DIAGNOSIS — H1130 Conjunctival hemorrhage, unspecified eye: Secondary | ICD-10-CM | POA: Diagnosis not present

## 2013-12-07 ENCOUNTER — Other Ambulatory Visit: Payer: Self-pay | Admitting: Cardiovascular Disease

## 2013-12-09 ENCOUNTER — Other Ambulatory Visit: Payer: Self-pay | Admitting: *Deleted

## 2013-12-09 ENCOUNTER — Encounter (INDEPENDENT_AMBULATORY_CARE_PROVIDER_SITE_OTHER): Payer: Medicare Other

## 2013-12-09 ENCOUNTER — Encounter: Payer: Self-pay | Admitting: Internal Medicine

## 2013-12-09 ENCOUNTER — Ambulatory Visit (INDEPENDENT_AMBULATORY_CARE_PROVIDER_SITE_OTHER): Payer: Medicare Other | Admitting: Internal Medicine

## 2013-12-09 ENCOUNTER — Other Ambulatory Visit (INDEPENDENT_AMBULATORY_CARE_PROVIDER_SITE_OTHER): Payer: Medicare Other

## 2013-12-09 ENCOUNTER — Telehealth: Payer: Self-pay | Admitting: Cardiovascular Disease

## 2013-12-09 DIAGNOSIS — R002 Palpitations: Secondary | ICD-10-CM

## 2013-12-09 DIAGNOSIS — I472 Ventricular tachycardia: Secondary | ICD-10-CM | POA: Diagnosis not present

## 2013-12-09 DIAGNOSIS — I4891 Unspecified atrial fibrillation: Secondary | ICD-10-CM

## 2013-12-09 DIAGNOSIS — T82198A Other mechanical complication of other cardiac electronic device, initial encounter: Secondary | ICD-10-CM | POA: Insufficient documentation

## 2013-12-09 DIAGNOSIS — Z95810 Presence of automatic (implantable) cardiac defibrillator: Secondary | ICD-10-CM | POA: Diagnosis not present

## 2013-12-09 LAB — BASIC METABOLIC PANEL
BUN: 15 mg/dL (ref 6–23)
Chloride: 107 mEq/L (ref 96–112)
Glucose, Bld: 93 mg/dL (ref 70–99)
Potassium: 4.2 mEq/L (ref 3.5–5.1)

## 2013-12-09 LAB — TSH: TSH: 2.44 u[IU]/mL (ref 0.35–5.50)

## 2013-12-09 NOTE — Assessment & Plan Note (Signed)
The patient's device was interrogated and the information was fully reviewed.  The device was reprogrammed to increase atrial sensiti9vity

## 2013-12-09 NOTE — Assessment & Plan Note (Signed)
He is noted to have atrial over sensing. We have reprogrammed sensitivity from 0.3--0.9 mV. This may be partly related to his palpitations as there may be some tendency to track in a properly sensed events

## 2013-12-09 NOTE — Assessment & Plan Note (Signed)
No intercurrent Ventricular tachycardia  

## 2013-12-09 NOTE — Progress Notes (Signed)
Alejandro Patient Care Team: Geoffery Lyons, [name] as PCP - General (Internal Medicine)   HPI  [name] is a 78 y.o. male Seen in followup for VT in the setting of ischemic cardiomyopathy with prior myocardial infarction, prior bypass surgery in 1995 He has histtory of ventricular tachycardia storm  And he takes amiodarone.  He has a history of reported cardiac arrest and is status post ICD implantation.   He has a history of with recent catheterization (January 2012) demonstrated patent vein grafts with an atretic LIMA to his ramus. Ejection fraction was 15-20%.   He was treated with amiodarone complicated at high dose by nausea and jitteriness. These symptoms have largely abated.   The patient denies chest pain, shortness of breath, nocturnal dyspnea, orthopnea or peripheral edema.  There have been no palpitations, lightheadedness or syncope.    Comes in today with complaints of palpitations.   Past Medical History  Diagnosis Date  . Myocardial infarction     1995, non-STEMI January 2012  . Ischemic cardiomyopathy     status post CABG with patent vein grafts and an atretic LIMA to his ramus, January 2012  . Ventricular tachycardia      VT Storm-January 2012- responded to Amiodarone  . Hypertension   . Ventricular fibrillation   . Chronic systolic congestive heart failure   . Hyperlipidemia   . Encounter for long-term (current) use of other medications     amiodarone  . Atrial arrhythmia/a fibrillation     not on coumadin 2/2 subdural hematoma  . Subdural hematoma     on coumadin  . Automatic implantable cardiac defibrillator in situ     medtronic  . CHF (congestive heart failure)   . Thyroid disease     hypothyroid    Past Surgical History  Procedure Laterality Date  . Insert / replace / remove pacemaker      [place] DR [id number]  . Bilateral cranitomies for sub hematomas    . Bypass graft  1995    5  . Coronary artery bypass graft  1995    5    Current  Outpatient Prescriptions  Medication Sig Dispense Refill  . amiodarone (PACERONE) 200 MG tablet TAKE 1 TABLET BY MOUTH 5 DAYS A WEEK (MONDAY, WEDNESDAY, THURSDAY, SATURDAY,& SUNDAY  30 tablet  0  . aspirin 81 MG tablet Take 81 mg by mouth daily.        . carvedilol (COREG) 6.25 MG tablet Take 1 tablet (6.25 mg total) by mouth 2 (two) times daily with a meal.  180 tablet  3  . gemfibrozil (LOPID) 600 MG tablet Take 1 tablet (600 mg total) by mouth 2 (two) times daily before a meal.  60 tablet  6  . levothyroxine (SYNTHROID, LEVOTHROID) 50 MCG tablet Take 50 mcg by mouth daily.        [name] Kitchen lisinopril (PRINIVIL,ZESTRIL) 5 MG tablet Take 5 mg by mouth daily.      . niacin (SLO-NIACIN) 500 MG tablet Take 2,000 mg by mouth daily.       . nitroGLYCERIN (NITROSTAT) 0.4 MG SL tablet Place 1 tablet (0.4 mg total) under the tongue every 5 (five) minutes as needed for chest pain. For chest pain x 3 doses  25 tablet  3  . spironolactone (ALDACTONE) 25 MG tablet TAKE 1 TABLET BY MOUTH ONCE A DAY .  90 tablet  3  . Tamsulosin HCl (FLOMAX) 0.4 MG CAPS Take 1 tablet by mouth daily.  No current facility-administered medications for this visit.    No Known Allergies  Review of Systems negative except from HPI and PMH  Physical Exam There were no vitals taken for this visit. Well developed and nourished in no acute distress HENT normal Neck supple with JVP-flat Clear Regular rate and rhythm, no murmurs or gallops Abd-soft with active BS No Clubbing cyanosis edema Skin-warm and dry A & Oriented  Grossly normal sensory and motor functiony     Assessment and  Plan And her and her a the RBCs the right I here and 9 ready to go down to Dr. one thing

## 2013-12-09 NOTE — Telephone Encounter (Signed)
Pt to be seen today for EKG nurse visit and ICD interrogation, results taken to Dr Caryl Comes for review.

## 2013-12-09 NOTE — Telephone Encounter (Signed)
[name] called my home phone this am.   He has been having palpitations for the past 2 weeks - not the VT that he has had in the past. The palpitations are intermittent - usually at night .  Not associated with CP, diaphoresis, syncope, pre-syncope.  I have recommended that he come to the office and get an ECG and ICD interrogation ( nurse visit)  .  We will also draw labs - BMP and TSH.   He is not having any CP.  I do not think he needs to come to the hospital - he is not in any distress.   I do want to make sure he is not having atrial  Fib  I spoke with Jodette who will call patient and arrange the above.   Thayer Headings, Brooke Bonito., MD, [facility] 12/09/2013, 8:03 AM Office - [phone] Pager [phone]

## 2013-12-09 NOTE — Assessment & Plan Note (Signed)
Interrogation of his device demonstrated only the atrial oversensing but also PVCs over these may be contributing to his palpitations. Potassium and magnesium levels are pending.

## 2013-12-12 LAB — MDC_IDC_ENUM_SESS_TYPE_INCLINIC
Battery Voltage: 3.03 V
Brady Statistic RA Percent Paced: 94.7 %
Date Time Interrogation Session: 20141212171643
HighPow Impedance: 42 Ohm
HighPow Impedance: 58 Ohm
Lead Channel Impedance Value: 494 Ohm
Lead Channel Pacing Threshold Amplitude: 0.625 V
Lead Channel Pacing Threshold Amplitude: 0.75 V
Lead Channel Pacing Threshold Pulse Width: 0.4 ms
Lead Channel Sensing Intrinsic Amplitude: 1 mV
Lead Channel Sensing Intrinsic Amplitude: 1.875 mV
Lead Channel Sensing Intrinsic Amplitude: 12.25 mV
Lead Channel Setting Pacing Amplitude: 2 V
Lead Channel Setting Pacing Pulse Width: 0.4 ms
Zone Setting Detection Interval: 230 ms
Zone Setting Detection Interval: 300 ms
Zone Setting Detection Interval: 400 ms

## 2013-12-13 ENCOUNTER — Other Ambulatory Visit: Payer: Self-pay | Admitting: Cardiovascular Disease

## 2013-12-16 ENCOUNTER — Other Ambulatory Visit: Payer: Self-pay | Admitting: *Deleted

## 2013-12-16 MED ORDER — LISINOPRIL 5 MG PO TABS: 5.0000 mg | ORAL_TABLET | Freq: Every day | ORAL | Status: DC

## 2013-12-19 ENCOUNTER — Encounter: Payer: Self-pay | Admitting: Internal Medicine

## 2013-12-20 ENCOUNTER — Encounter: Payer: Self-pay | Admitting: Internal Medicine

## 2014-01-18 DIAGNOSIS — D485 Neoplasm of uncertain behavior of skin: Secondary | ICD-10-CM | POA: Diagnosis not present

## 2014-01-18 DIAGNOSIS — L570 Actinic keratosis: Secondary | ICD-10-CM | POA: Diagnosis not present

## 2014-01-24 ENCOUNTER — Ambulatory Visit (INDEPENDENT_AMBULATORY_CARE_PROVIDER_SITE_OTHER): Payer: Medicare Other | Admitting: *Deleted

## 2014-01-24 DIAGNOSIS — I498 Other specified cardiac arrhythmias: Secondary | ICD-10-CM

## 2014-01-24 DIAGNOSIS — I472 Ventricular tachycardia, unspecified: Secondary | ICD-10-CM

## 2014-01-24 DIAGNOSIS — I499 Cardiac arrhythmia, unspecified: Secondary | ICD-10-CM | POA: Diagnosis not present

## 2014-01-24 DIAGNOSIS — I4729 Other ventricular tachycardia: Secondary | ICD-10-CM

## 2014-01-24 DIAGNOSIS — E785 Hyperlipidemia, unspecified: Secondary | ICD-10-CM

## 2014-01-24 DIAGNOSIS — I4901 Ventricular fibrillation: Secondary | ICD-10-CM | POA: Diagnosis not present

## 2014-01-24 LAB — LIPID PANEL
Cholesterol: 133 mg/dL (ref 0–200)
HDL: 50.8 mg/dL (ref >39.00)
LDL Cholesterol: 77 mg/dL (ref 0–99)
TRIGLYCERIDES: 24 mg/dL (ref 0.0–149.0)
Total CHOL/HDL Ratio: 3
VLDL: 4.8 mg/dL (ref 0.0–40.0)

## 2014-01-24 LAB — BASIC METABOLIC PANEL
BUN: 14 mg/dL (ref 6–23)
CO2: 27 mEq/L (ref 19–32)
CREATININE: 1.2 mg/dL (ref 0.4–1.5)
Calcium: 8.6 mg/dL (ref 8.4–10.5)
Chloride: 104 mEq/L (ref 96–112)
GFR: 60.17 mL/min (ref >60.00)
GLUCOSE: 94 mg/dL (ref 70–99)
Potassium: 4.4 mEq/L (ref 3.5–5.1)
Sodium: 137 mEq/L (ref 135–145)

## 2014-01-24 LAB — HEPATIC FUNCTION PANEL
ALT: 31 U/L (ref 0–53)
AST: 45 U/L — ABNORMAL HIGH (ref 0–37)
Albumin: 3.4 g/dL — ABNORMAL LOW (ref 3.5–5.2)
Alkaline Phosphatase: 47 U/L (ref 39–117)
BILIRUBIN DIRECT: 0.2 mg/dL (ref 0.0–0.3)
TOTAL PROTEIN: 6.5 g/dL (ref 6.0–8.3)
Total Bilirubin: 1.1 mg/dL (ref 0.3–1.2)

## 2014-01-27 ENCOUNTER — Ambulatory Visit (INDEPENDENT_AMBULATORY_CARE_PROVIDER_SITE_OTHER): Payer: Medicare Other | Admitting: Cardiovascular Disease

## 2014-01-27 ENCOUNTER — Encounter: Payer: Self-pay | Admitting: Cardiovascular Disease

## 2014-01-27 VITALS — BP 96/62 | HR 66 | Ht 71.0 in | Wt 162.8 lb

## 2014-01-27 DIAGNOSIS — I5022 Chronic systolic (congestive) heart failure: Secondary | ICD-10-CM

## 2014-01-27 DIAGNOSIS — I509 Heart failure, unspecified: Secondary | ICD-10-CM | POA: Diagnosis not present

## 2014-01-27 MED ORDER — AMIODARONE HCL 200 MG PO TABS: ORAL_TABLET | ORAL | Status: DC

## 2014-01-27 NOTE — Progress Notes (Signed)
[name] Date of Birth  [birth year]       [facility]    [employer] [address] [place], [place], [place]  [address]   [place], [place]  [address] [phone]     [phone]   Fax  [phone]    Fax [phone]  Problem List: 1. Coronary artery disease-status post anterior wall myocardial infarction. His subsequent CABG in 1995 2. Congestive heart failure 3. History of ICD 4. Ventricular tachycardia storm-currently controlled on amiodarone 5. Subdural hematoma while on Coumadin 2. Hypothyroidism  History of Present Illness: [name] is a 79 year old gentleman with a history of coronary artery disease. He is status post anterior wall myocardial infarction with subsequent coronary artery bypass grafting in 1995. He has had congestive heart failure. He has had an ICD placed. He was admitted to the hospital last year with an episode of ventricular tachycardia storm. The VT was well controlled on amiodarone.  He has seen Dr. Caryl Comes. The plan is to decrease the amiodarone dose  after he's been stable for another 6 months.  In October 2012 , he had some liver enzyme elevations.   His Lopid was held for a month or so. His liver enzymes returned to normal. The liver ultrasound was negative. He has restarted his Lopid.  He denies any nausea. His appetite is good.  He is now getting back to his normal activities. He has been able to get down to the beach. He is now walking between 2 and 4 miles every day. He does water aerobics on a daily basis. He's not having episodes of chest pain or shortness of breath.  January 18, 2013:  It is doing very well. He's not had any further episodes of chest pain or shortness breath. His ventricular  tachycardia has been well-controlled. He's been quite busy recently. He is looking forward to getting back down on his fishing.  July 28, 2013:  [name] remains active.  Walks regularly , no further  episodes of VT.    No angina.  Would like to gain a little weight.  He's slowly but steadily lost weight over the summer.  We will check a TSH today.  Jan. 30, 2015:  [name] is doing well.   Still walking every day.     Current Outpatient Prescriptions on File Prior to Visit  Medication Sig Dispense Refill  . amiodarone (PACERONE) 200 MG tablet TAKE 1 TABLET BY MOUTH 5 DAYS A WEEK (MONDAY, WEDNESDAY, THURSDAY, SATURDAY,& SUNDAY  30 tablet  0  . aspirin 81 MG tablet Take 81 mg by mouth daily.        . carvedilol (COREG) 6.25 MG tablet Take 1 tablet (6.25 mg total) by mouth 2 (two) times daily with a meal.  180 tablet  3  . gemfibrozil (LOPID) 600 MG tablet Take 1 tablet (600 mg total) by mouth 2 (two) times daily before a meal.  60 tablet  6  . levothyroxine (SYNTHROID, LEVOTHROID) 50 MCG tablet Take 50 mcg by mouth daily.        [name] Kitchen lisinopril (PRINIVIL,ZESTRIL) 5 MG tablet Take 1 tablet (5 mg total) by mouth daily.  30 tablet  1  . niacin (SLO-NIACIN) 500 MG tablet Take 2,000 mg by mouth daily.       . nitroGLYCERIN (NITROSTAT) 0.4 MG SL tablet Place 1 tablet (0.4 mg total) under the tongue every 5 (five) minutes as needed for chest pain.  For chest pain x 3 doses  25 tablet  3  . spironolactone (ALDACTONE) 25 MG tablet TAKE 1 TABLET BY MOUTH ONCE A DAY .  90 tablet  3  . Tamsulosin HCl (FLOMAX) 0.4 MG CAPS Take 1 tablet by mouth daily.       No current facility-administered medications on file prior to visit.    No Known Allergies  Past Medical History  Diagnosis Date  . Myocardial infarction     1995, non-STEMI January 2012  . Ischemic cardiomyopathy     status post CABG with patent vein grafts and an atretic LIMA to his ramus, January 2012  . Ventricular tachycardia      VT Storm-January 2012- responded to Amiodarone  . Hypertension   . Ventricular fibrillation   . Chronic systolic congestive heart failure   . Hyperlipidemia   . Encounter for long-term (current) use of other  medications     amiodarone  . Atrial arrhythmia/a fibrillation     not on coumadin 2/2 subdural hematoma  . Subdural hematoma     on coumadin  . Automatic implantable cardiac defibrillator in situ     medtronic  . CHF (congestive heart failure)   . Thyroid disease     hypothyroid    Past Surgical History  Procedure Laterality Date  . Insert / replace / remove pacemaker      [place] DR [id number]  . Bilateral cranitomies for sub hematomas    . Bypass graft  1995    5  . Coronary artery bypass graft  1995    5    History  Smoking status  . Never Smoker   Smokeless tobacco  . Former Systems developer    History  Alcohol Use No    No family history on file.  Reviw of Systems:  Reviewed in the HPI.  All other systems are negative.  Physical Exam: There were no vitals taken for this visit. General: Well developed, well nourished, in no acute distress.  Head: Normocephalic, atraumatic, sclera non-icteric, mucus membranes are moist,   Neck: Supple. Carotids are 2 + without bruits. No JVD  Lungs: Clear bilaterally to auscultation.  Heart: regular rate.  normal  S1 S2. No murmurs, gallops or rubs.  Abdomen: Soft, non-tender, non-distended with normal bowel sounds. No hepatomegaly. No rebound/guarding. No masses.  Msk:  Strength and tone are normal  Extremities: No clubbing or cyanosis. No edema.  Distal pedal pulses are 2+ and equal bilaterally.  Neuro: Alert and oriented X 3. Moves all extremities spontaneously.  Psych:  Responds to questions appropriately with a normal affect.  ECG:  Assessment / Plan:

## 2014-01-27 NOTE — Patient Instructions (Signed)
Your physician wants you to follow-up in: 6 months  You will receive a reminder letter in the mail two months in advance. If you don't receive a letter, please call our office to schedule the follow-up appointment.  Your physician recommends that you continue on your current medications as directed. Please refer to the Current Medication list given to you today.  

## 2014-01-31 NOTE — Assessment & Plan Note (Signed)
His CHF has been well controlled.   Continue current meds.

## 2014-02-08 ENCOUNTER — Other Ambulatory Visit: Payer: Self-pay

## 2014-02-08 MED ORDER — GEMFIBROZIL 600 MG PO TABS: 600.0000 mg | ORAL_TABLET | Freq: Two times a day (BID) | ORAL | Status: DC

## 2014-02-09 ENCOUNTER — Other Ambulatory Visit: Payer: Self-pay

## 2014-02-09 MED ORDER — GEMFIBROZIL 600 MG PO TABS: 600.0000 mg | ORAL_TABLET | Freq: Two times a day (BID) | ORAL | Status: DC

## 2014-03-23 ENCOUNTER — Other Ambulatory Visit: Payer: Self-pay | Admitting: Cardiovascular Disease

## 2014-03-24 ENCOUNTER — Ambulatory Visit (INDEPENDENT_AMBULATORY_CARE_PROVIDER_SITE_OTHER): Payer: Medicare Other | Admitting: Internal Medicine

## 2014-03-24 ENCOUNTER — Encounter: Payer: Self-pay | Admitting: Internal Medicine

## 2014-03-24 VITALS — BP 117/74 | HR 76 | Ht 71.0 in | Wt 157.4 lb

## 2014-03-24 DIAGNOSIS — I4729 Other ventricular tachycardia: Secondary | ICD-10-CM

## 2014-03-24 DIAGNOSIS — I2589 Other forms of chronic ischemic heart disease: Secondary | ICD-10-CM | POA: Diagnosis not present

## 2014-03-24 DIAGNOSIS — I255 Ischemic cardiomyopathy: Secondary | ICD-10-CM

## 2014-03-24 DIAGNOSIS — Z95810 Presence of automatic (implantable) cardiac defibrillator: Secondary | ICD-10-CM | POA: Diagnosis not present

## 2014-03-24 DIAGNOSIS — I5022 Chronic systolic (congestive) heart failure: Secondary | ICD-10-CM

## 2014-03-24 DIAGNOSIS — IMO0002 Reserved for concepts with insufficient information to code with codable children: Secondary | ICD-10-CM | POA: Diagnosis not present

## 2014-03-24 DIAGNOSIS — I10 Essential (primary) hypertension: Secondary | ICD-10-CM | POA: Diagnosis not present

## 2014-03-24 DIAGNOSIS — I472 Ventricular tachycardia, unspecified: Secondary | ICD-10-CM

## 2014-03-24 DIAGNOSIS — I509 Heart failure, unspecified: Secondary | ICD-10-CM

## 2014-03-24 DIAGNOSIS — K4090 Unilateral inguinal hernia, without obstruction or gangrene, not specified as recurrent: Secondary | ICD-10-CM | POA: Diagnosis not present

## 2014-03-24 MED ORDER — CARVEDILOL 6.25 MG PO TABS: 6.2500 mg | ORAL_TABLET | Freq: Two times a day (BID) | ORAL | Status: DC

## 2014-03-24 MED ORDER — AMIODARONE HCL 200 MG PO TABS: 100.0000 mg | ORAL_TABLET | Freq: Every day | ORAL | Status: DC

## 2014-03-24 NOTE — Patient Instructions (Addendum)
Your physician has recommended you make the following change in your medication:  1) Decrease Amiodarone to 100 mg daily   Remote monitoring is used to monitor your Pacemaker of ICD from home. This monitoring reduces the number of office visits required to check your device to one time per year. It allows [place] to keep an eye on the functioning of your device to ensure it is working properly. You are scheduled for a device check from home on 06/26/14. You may send your transmission at any time that day. If you have a wireless device, the transmission will be sent automatically. After your physician reviews your transmission, you will receive a postcard with your next transmission date.  Your physician wants you to follow-up in: 1 year with Dr. Caryl Comes.  You will receive a reminder letter in the mail two months in advance. If you don't receive a letter, please call our office to schedule the follow-up appointment.

## 2014-03-24 NOTE — Progress Notes (Signed)
Patient Care Team: Geoffery Lyons, MD as PCP - General (Internal Medicine)   HPI  [name] is a 79 y.o. male Seen in followup for VT in the setting of ischemic cardiomyopathy with prior myocardial infarction, prior bypass surgery in 1995 He has histtory of ventricular tachycardia storm And he takes amiodarone high doses of which were poorly tolerated because of nausea and jitteriness He has a history of reported cardiac arrest and is status post ICD implantation.   He has  recent catheterization (January 2012) whiich demonstrated patent vein grafts with an atretic LIMA to his ramus. Ejection fraction was 15-20%.   He has had intercurrent flulike illness for which he is recovering somewhat.  Drug surveillance laboratory work drawn in [place]. His AST remains mildly elevated. Thyroid functions and potassium levels were normal   Past Medical History  Diagnosis Date  . Myocardial infarction     1995, non-STEMI January 2012  . Ischemic cardiomyopathy     status post CABG with patent vein grafts and an atretic LIMA to his ramus, January 2012  . Ventricular tachycardia      VT Storm-January 2012- responded to Amiodarone  . Hypertension   . Ventricular fibrillation   . Chronic systolic congestive heart failure   . Hyperlipidemia   . Encounter for long-term (current) use of other medications     amiodarone  . Atrial arrhythmia/a fibrillation     not on coumadin 2/2 subdural hematoma  . Subdural hematoma     on coumadin  . Automatic implantable cardiac defibrillator in situ     medtronic  . CHF (congestive heart failure)   . Thyroid disease     hypothyroid    Past Surgical History  Procedure Laterality Date  . Insert / replace / remove pacemaker      [place] DR [id number]  . Bilateral cranitomies for sub hematomas    . Bypass graft  1995    5  . Coronary artery bypass graft  1995    5    Current Outpatient Prescriptions  Medication Sig  Dispense Refill  . amiodarone (PACERONE) 200 MG tablet TAKE 1 TABLET BY MOUTH 5 DAYS A WEEK (MONDAY, WEDNESDAY, THURSDAY, SATURDAY,& SUNDAY  90 tablet  3  . aspirin 81 MG tablet Take 81 mg by mouth daily.        . carvedilol (COREG) 6.25 MG tablet TAKE 1 TABLET (6.25 MG TOTAL) BY MOUTH 2 (TWO) TIMES DAILY WITH A MEAL.  180 tablet  0  . gemfibrozil (LOPID) 600 MG tablet Take 1 tablet (600 mg total) by mouth 2 (two) times daily before a meal.  60 tablet  6  . levothyroxine (SYNTHROID, LEVOTHROID) 50 MCG tablet Take 50 mcg by mouth daily.        [name] Kitchen lisinopril (PRINIVIL,ZESTRIL) 5 MG tablet Take 1 tablet (5 mg total) by mouth daily.  30 tablet  1  . niacin (SLO-NIACIN) 500 MG tablet Take 2,000 mg by mouth daily.       . nitroGLYCERIN (NITROSTAT) 0.4 MG SL tablet Place 1 tablet (0.4 mg total) under the tongue every 5 (five) minutes as needed for chest pain. For chest pain x 3 doses  25 tablet  3  . spironolactone (ALDACTONE) 25 MG tablet TAKE 1 TABLET BY MOUTH ONCE A DAY .  90 tablet  3  . Tamsulosin HCl (FLOMAX) 0.4 MG CAPS Take 1 tablet by mouth daily.       No  current facility-administered medications for this visit.    No Known Allergies  Review of Systems negative except from HPI and PMH  Physical Exam BP 117/74  Pulse 76  Ht 5\' 11"  (1.803 m)  Wt 157 lb 6.4 oz (71.396 kg)  BMI 21.96 kg/m2 Well developed and well nourished in no acute distress HENT normal E scleral and icterus clear Neck Supple JVP flat; carotids brisk and full Clear to ausculation  Regular rate and rhythm, no murmurs gallops or rub Soft with active bowel sounds No clubbing cyanosis  Edema Alert and oriented, grossly normal motor and sensory function Skin Warm and Dry  Optivol screen was normal  Assessment and  Plan  Ischemic cardiomyopathy  Congestive heart failure-chronic-systolic  Ventricular tachycardia  Atrial over sensing being classified by the ICD as atrial fibrillation  Defibrillator-Medtronic   The patient's device was interrogated.  The information was reviewed. No changes were made in the programming.    [name] is euvolemic. His rhythms are quiet at this time. We will decrease his amiodarone dose from 200 mg 5 days a week--100 mg a day. As noted above surveillance laboratories were normal  We'll see her in one years time although i may get a chance to go fishing before then

## 2014-04-05 ENCOUNTER — Ambulatory Visit (INDEPENDENT_AMBULATORY_CARE_PROVIDER_SITE_OTHER): Payer: Medicare Other | Admitting: General Surgery

## 2014-04-05 ENCOUNTER — Encounter (INDEPENDENT_AMBULATORY_CARE_PROVIDER_SITE_OTHER): Payer: Self-pay | Admitting: General Surgery

## 2014-04-05 ENCOUNTER — Encounter (INDEPENDENT_AMBULATORY_CARE_PROVIDER_SITE_OTHER): Payer: Self-pay

## 2014-04-05 VITALS — BP 116/76 | HR 80 | Temp 97.6°F | Resp 16 | Ht 71.0 in | Wt 156.6 lb

## 2014-04-05 DIAGNOSIS — K4090 Unilateral inguinal hernia, without obstruction or gangrene, not specified as recurrent: Secondary | ICD-10-CM

## 2014-04-05 NOTE — Progress Notes (Signed)
Patient ID: [name], male   DOB: 1935/12/06, 79 y.o.   MRN: [id number]  Chief Complaint  Patient presents with  . New Evaluation    eval LIH    HPI [name] is a 79 y.o. male. Chief complaint: Left inguinal hernia HPI Patient has a history of left inguinal hernia for some time. More recently, he has some mild discomfort in the area. This usually is exacerbated by walking. His left inguinal mass goes in and out. Usually comes out when he is up walking for a period of time. No change in bowel habits. No difficulties with urination. No significant pain, just the discomfort.Of note, he is status post right inguinal hernia repair by Dr. Margot Chimes in 1995. Past Medical History  Diagnosis Date  . Myocardial infarction     1995, non-STEMI January 2012  . Ischemic cardiomyopathy     status post CABG with patent vein grafts and an atretic LIMA to his ramus, January 2012  . Ventricular tachycardia      VT Storm-January 2012- responded to Amiodarone  . Hypertension   . Ventricular fibrillation   . Chronic systolic congestive heart failure   . Hyperlipidemia   . Encounter for long-term (current) use of other medications     amiodarone  . Atrial arrhythmia/a fibrillation     not on coumadin 2/2 subdural hematoma  . Subdural hematoma     on coumadin  . Automatic implantable cardiac defibrillator in situ     medtronic  . CHF (congestive heart failure)   . Thyroid disease     hypothyroid    Past Surgical History  Procedure Laterality Date  . Insert / replace / remove pacemaker      [place] DR [id number]  . Bilateral cranitomies for sub hematomas    . Bypass graft  1995    5  . Coronary artery bypass graft  1995    5    History reviewed. No pertinent family history.  Social History History  Substance Use Topics  . Smoking status: Never Smoker   . Smokeless tobacco: Former Systems developer  . Alcohol Use: No    No Known Allergies  Current Outpatient Prescriptions   Medication Sig Dispense Refill  . amiodarone (PACERONE) 200 MG tablet Take 0.5 tablets (100 mg total) by mouth daily.  90 tablet  3  . aspirin 81 MG tablet Take 81 mg by mouth daily.        . carvedilol (COREG) 6.25 MG tablet Take 1 tablet (6.25 mg total) by mouth 2 (two) times daily with a meal.  180 tablet  3  . gemfibrozil (LOPID) 600 MG tablet Take 1 tablet (600 mg total) by mouth 2 (two) times daily before a meal.  60 tablet  6  . levothyroxine (SYNTHROID, LEVOTHROID) 50 MCG tablet Take 50 mcg by mouth daily.        [name] Kitchen lisinopril (PRINIVIL,ZESTRIL) 5 MG tablet Take 1 tablet (5 mg total) by mouth daily.  30 tablet  1  . niacin (SLO-NIACIN) 500 MG tablet Take 2,000 mg by mouth daily.       . Tamsulosin HCl (FLOMAX) 0.4 MG CAPS Take 1 tablet by mouth daily.      . nitroGLYCERIN (NITROSTAT) 0.4 MG SL tablet Place 1 tablet (0.4 mg total) under the tongue every 5 (five) minutes as needed for chest pain. For chest pain x 3 doses  25 tablet  3  . spironolactone (ALDACTONE) 25 MG tablet TAKE 1 TABLET  BY MOUTH ONCE A DAY .  90 tablet  3   No current facility-administered medications for this visit.    Review of Systems Review of Systems  Constitutional: Negative.   HENT: Negative.   Eyes: Negative.   Respiratory: Negative.   Cardiovascular:       AICD and CHF  Gastrointestinal:       See history of present illness  Endocrine: Negative.   Genitourinary: Negative.   Allergic/Immunologic: Negative.   Neurological: Negative.   Hematological: Negative.     Blood pressure 116/76, pulse 80, temperature 97.6 F (36.4 C), temperature source Temporal, resp. rate 16, height 5\' 11"  (1.803 m), weight 156 lb 9.6 oz (71.033 kg).  Physical Exam Physical Exam  Constitutional: He is oriented to person, place, and time. He appears well-developed and well-nourished.  HENT:  Head: Normocephalic and atraumatic.  Right Ear: External ear normal.  Left Ear: External ear normal.  Mouth/Throat: No  oropharyngeal exudate.  Eyes: EOM are normal. Pupils are equal, round, and reactive to light. Right eye exhibits no discharge. Left eye exhibits no discharge.  Neck: Normal range of motion. Neck supple. No tracheal deviation present.  Cardiovascular: Normal rate and normal heart sounds.   AICD left chest  Pulmonary/Chest: Effort normal and breath sounds normal. No stridor. No respiratory distress. He has no wheezes. He has no rales.  Abdominal: Soft. Bowel sounds are normal. He exhibits no distension. There is no tenderness. There is no rebound and no guarding.  Genitourinary:  Testes are descended, no evidence of right inguinal hernia, left inguinal hernia is present, it easily reduces but spontaneously recurs. It does not extend down into his scrotum. No pain on reduction or examination.  Musculoskeletal: Normal range of motion. He exhibits no tenderness.  Neurological: He is alert and oriented to person, place, and time. He exhibits normal muscle tone. Coordination normal.  Skin: Skin is warm and dry.  Psychiatric: He has a normal mood and affect.    Data Reviewed Office visit with Dr. Reynaldo Minium and Dr. Caryl Comes  Assessment    Symptomatic left inguinal hernia    Plan    I have offered left inguinal hernia repair with mesh. We will need preoperative clearance from Dr. Caryl Comes in order to turn off his AICD during surgery and regarding his CHF. We'll plan an overnight stay with telemetry monitoring. Procedure, risks, and benefits were discussed in detail with the patient. We will plan to schedule once we were back from Dr. Caryl Comes.       Zenovia Jarred 04/05/2014, 10:58 AM

## 2014-04-17 ENCOUNTER — Telehealth: Payer: Self-pay | Admitting: *Deleted

## 2014-04-17 ENCOUNTER — Encounter (INDEPENDENT_AMBULATORY_CARE_PROVIDER_SITE_OTHER): Payer: Self-pay

## 2014-04-17 ENCOUNTER — Telehealth (INDEPENDENT_AMBULATORY_CARE_PROVIDER_SITE_OTHER): Payer: Self-pay

## 2014-04-17 NOTE — Telephone Encounter (Signed)
Clearance here and to [facility] to scan. Surgery sched sheet and req to place orders in epic to Dr Grandville Silos.

## 2014-04-17 NOTE — Telephone Encounter (Signed)
Faxed cardiac clearance for left inguinal hernia.

## 2014-04-19 ENCOUNTER — Other Ambulatory Visit (INDEPENDENT_AMBULATORY_CARE_PROVIDER_SITE_OTHER): Payer: Self-pay | Admitting: General Surgery

## 2014-04-19 NOTE — Telephone Encounter (Signed)
Orders in epic and sched form to surgery sched to call pt.

## 2014-05-01 DIAGNOSIS — Z950 Presence of cardiac pacemaker: Secondary | ICD-10-CM | POA: Diagnosis not present

## 2014-05-01 DIAGNOSIS — E785 Hyperlipidemia, unspecified: Secondary | ICD-10-CM | POA: Diagnosis not present

## 2014-05-01 DIAGNOSIS — IMO0002 Reserved for concepts with insufficient information to code with codable children: Secondary | ICD-10-CM | POA: Diagnosis not present

## 2014-05-01 DIAGNOSIS — M1990 Unspecified osteoarthritis, unspecified site: Secondary | ICD-10-CM | POA: Diagnosis not present

## 2014-05-01 DIAGNOSIS — I10 Essential (primary) hypertension: Secondary | ICD-10-CM | POA: Diagnosis not present

## 2014-05-01 DIAGNOSIS — I2510 Atherosclerotic heart disease of native coronary artery without angina pectoris: Secondary | ICD-10-CM | POA: Diagnosis not present

## 2014-05-02 ENCOUNTER — Encounter (HOSPITAL_COMMUNITY): Payer: Self-pay | Admitting: Pharmacy Technician

## 2014-05-05 ENCOUNTER — Encounter (HOSPITAL_COMMUNITY)
Admission: RE | Admit: 2014-05-05 | Discharge: 2014-05-05 | Disposition: A | Discharge status: Home / Self Care | Payer: Medicare Other | Source: Ambulatory Visit | Attending: General Surgery | Admitting: General Surgery

## 2014-05-05 ENCOUNTER — Ambulatory Visit (HOSPITAL_COMMUNITY)
Admission: RE | Admit: 2014-05-05 | Discharge: 2014-05-05 | Disposition: A | Discharge status: Home / Self Care | Payer: Medicare Other | Source: Ambulatory Visit | Attending: Anesthesiology | Admitting: Anesthesiology

## 2014-05-05 ENCOUNTER — Encounter (HOSPITAL_COMMUNITY): Payer: Self-pay

## 2014-05-05 DIAGNOSIS — Z01818 Encounter for other preprocedural examination: Secondary | ICD-10-CM | POA: Diagnosis not present

## 2014-05-05 DIAGNOSIS — Z01812 Encounter for preprocedural laboratory examination: Secondary | ICD-10-CM | POA: Insufficient documentation

## 2014-05-05 HISTORY — DX: Cardiac arrhythmia, unspecified: I49.9

## 2014-05-05 HISTORY — DX: Personal history of other diseases of the digestive system: Z87.19

## 2014-05-05 HISTORY — DX: Presence of cardiac pacemaker: Z95.0

## 2014-05-05 HISTORY — DX: Presence of automatic (implantable) cardiac defibrillator: Z95.810

## 2014-05-05 HISTORY — DX: Hypothyroidism, unspecified: E03.9

## 2014-05-05 LAB — BASIC METABOLIC PANEL
BUN: 16 mg/dL (ref 6–23)
CALCIUM: 8.8 mg/dL (ref 8.4–10.5)
CO2: 24 mEq/L (ref 19–32)
CREATININE: 1.1 mg/dL (ref 0.50–1.35)
Chloride: 107 mEq/L (ref 96–112)
GFR calc Af Amer: 73 mL/min — ABNORMAL LOW (ref >90)
GFR calc non Af Amer: 63 mL/min — ABNORMAL LOW (ref >90)
Glucose, Bld: 77 mg/dL (ref 70–99)
Potassium: 5.2 mEq/L (ref 3.7–5.3)
SODIUM: 142 meq/L (ref 137–147)

## 2014-05-05 LAB — CBC
HCT: 41.6 % (ref 39.0–52.0)
Hemoglobin: 13.7 g/dL (ref 13.0–17.0)
MCH: 32.5 pg (ref 26.0–34.0)
MCHC: 32.9 g/dL (ref 30.0–36.0)
MCV: 98.8 fL (ref 78.0–100.0)
PLATELETS: 137 10*3/uL — AB (ref 150–400)
RBC: 4.21 MIL/uL — AB (ref 4.22–5.81)
RDW: 14.3 % (ref 11.5–15.5)
WBC: 4.9 10*3/uL (ref 4.0–10.5)

## 2014-05-05 NOTE — Progress Notes (Signed)
icd order sheet faxed to [facility]

## 2014-05-05 NOTE — Pre-Procedure Instructions (Addendum)
[name]  05/05/2014   Your procedure is scheduled on:  05/15/14  Report to [facility] cone short stay admitting at 19 AM.  Call this number if you have problems the morning of surgery: [phone]   Remember:   Do not eat food or drink liquids after midnight.   Take these medicines the morning of surgery with A SIP OF WATER: amiodarone,nitro if needed, carvedilol, levothryroxine,         Take all meds as ordered until day of surgery except as below or per dr       [name] all herbel meds, nsaids (aleve,naproxen,advil,ibuprofen) 5 days prior to surgery including aspirin,vitamins, niacin    Do not wear jewelry, make-up or nail polish.  Do not wear lotions, powders, or perfumes. You may wear deodorant.  Do not shave 48 hours prior to surgery. Men may shave face and neck.  Do not bring valuables to the hospital.  [facility] is not responsible                  for any belongings or valuables.               Contacts, dentures or bridgework may not be worn into surgery.  Leave suitcase in the car. After surgery it may be brought to your room.  For patients admitted to the hospital, discharge time is determined by your                treatment team.               Patients discharged the day of surgery will not be allowed to drive  home.  Name and phone number of your driver:   Special Instructions:  Special Instructions: [place] - Preparing for Surgery  Before surgery, you can play an important role.  Because skin is not sterile, your skin needs to be as free of germs as possible.  You can reduce the number of germs on you skin by washing with CHG (chlorahexidine gluconate) soap before surgery.  CHG is an antiseptic cleaner which kills germs and bonds with the skin to continue killing germs even after washing.  Please DO NOT use if you have an allergy to CHG or antibacterial soaps.  If your skin becomes reddened/irritated stop using the CHG and inform your nurse when you arrive at  Short Stay.  Do not shave (including legs and underarms) for at least 48 hours prior to the first CHG shower.  You may shave your face.  Please follow these instructions carefully:   1.  Shower with CHG Soap the night before surgery and the morning of Surgery.  2.  If you choose to wash your hair, wash your hair first as usual with your normal shampoo.  3.  After you shampoo, rinse your hair and body thoroughly to remove the Shampoo.  4.  Use CHG as you would any other liquid soap.  You can apply chg directly  to the skin and wash gently with scrungie or a clean washcloth.  5.  Apply the CHG Soap to your body ONLY FROM THE NECK DOWN.  Do not use on open wounds or open sores.  Avoid contact with your eyes ears, mouth and genitals (private parts).  Wash genitals (private parts)       with your normal soap.  6.  Wash thoroughly, paying special attention to the area where your surgery will be performed.  7.  Thoroughly rinse your body  with warm water from the neck down.  8.  DO NOT shower/wash with your normal soap after using and rinsing off the CHG Soap.  9.  Pat yourself dry with a clean towel.            10.  Wear clean pajamas.            11.  Place clean sheets on your bed the night of your first shower and do not sleep with pets.  Day of Surgery  Do not apply any lotions/deodorants the morning of surgery.  Please wear clean clothes to the hospital/surgery center.   Please read over the following fact sheets that you were given: Pain Booklet, Coughing and Deep Breathing and Surgical Site Infection Prevention

## 2014-05-08 NOTE — Progress Notes (Signed)
Anesthesia Chart Review:  Patient is a 79 year old male scheduled for repair of left inguinal hernia with mesh on 05/15/14 by Dr. Grandville Silos.    History includes non-smoker, CAD/MI '95 and '12 s/p CABG '95, ischemic cardiomyopathy, chronic systolic CHF, afib, vfib/VT storm '12 (treated with amiodarone), Medtronic AICD (generator change 03/28/13), hypothyroidism, hiatal hernia, HLD, bilateral craniotomies for SDH (while on Coumadin) 11/11/05. EP cardiologist Dr. Caryl Comes is aware of plans for surgery, and felt that if he remained clinically stable then patient would be okay for surgery. He also sees Dr. Mertie Moores, last visit 12/2013. PCP is listed as Dr. Burnard Bunting.  EKG on 12/09/13 showed: a-paced rhythm, LAD, right BBB, septal infarct (age undetermined), T wave abnormality, consider lateral ischemia.  His last cardiac cath on 01/21/11 showed: 1. 3V CAD status post 5V CABG with 4/5 patent bypass grafts. (LIMA to RI vessel is small and atretic with no significant flow. SVG to PDA, SVG to mid LAD, SVG to DIAG, SVG to OM2 were all patent.) 2. No indication currently for PCI. 3. Severe LV systolic function with akinesis of anterior wall and apex with ejection fraction of 15-20%. Continued medical management was recommended.  Echo on 07/24/09 showed: Marked LV systolic dysfunction. EF 25-30%. Impaired relaxation. Mild mitral stenosis. Trace mitral regurgitation. Mild tricuspid regurgitation. Mild pulmonic insufficiency.  CXR on 05/05/14 showed: No active cardiopulmonary disease.  Preoperative labs noted.    Patient with a significant cardiac history including CAD, severe LV dysfunction, VT, and AICD. His EP cardiologist cleared him pending he remains stable for a CV/CHF standpoint. Patient will be evaluated by his assigned anesthesiologist on the day of surgery. If no acute CV/CHF symptoms then I would anticipate that he can proceed as planned.  George Hugh [facility] Short Stay  Center/Anesthesiology Phone [phone] 05/08/2014 2:53 PM

## 2014-05-14 MED ORDER — CHLORHEXIDINE GLUCONATE 4 % EX LIQD
1.0000 "application " | Freq: Once | CUTANEOUS | Status: DC
  Filled 2014-05-14: qty 15

## 2014-05-14 MED ORDER — CEFAZOLIN SODIUM-DEXTROSE 2-3 GM-% IV SOLR: 2.0000 g | INTRAVENOUS | Status: DC

## 2014-05-15 ENCOUNTER — Encounter (HOSPITAL_COMMUNITY): Payer: Medicare Other | Admitting: Vascular Surgery

## 2014-05-15 ENCOUNTER — Observation Stay (HOSPITAL_COMMUNITY)
Admission: RE | Admit: 2014-05-15 | Discharge: 2014-05-16 | Disposition: A | Discharge status: Home / Self Care | Payer: Medicare Other | Source: Ambulatory Visit | Attending: General Surgery | Admitting: General Surgery

## 2014-05-15 ENCOUNTER — Encounter (HOSPITAL_COMMUNITY): Payer: Self-pay | Admitting: *Deleted

## 2014-05-15 ENCOUNTER — Encounter (HOSPITAL_COMMUNITY)
Admission: RE | Disposition: A | Discharge status: Home / Self Care | Payer: Self-pay | Source: Ambulatory Visit | Attending: General Surgery

## 2014-05-15 ENCOUNTER — Ambulatory Visit (HOSPITAL_COMMUNITY): Payer: Medicare Other | Admitting: Anesthesiology

## 2014-05-15 DIAGNOSIS — K449 Diaphragmatic hernia without obstruction or gangrene: Secondary | ICD-10-CM | POA: Diagnosis not present

## 2014-05-15 DIAGNOSIS — Z95810 Presence of automatic (implantable) cardiac defibrillator: Secondary | ICD-10-CM | POA: Insufficient documentation

## 2014-05-15 DIAGNOSIS — I10 Essential (primary) hypertension: Secondary | ICD-10-CM | POA: Diagnosis not present

## 2014-05-15 DIAGNOSIS — Z9889 Other specified postprocedural states: Secondary | ICD-10-CM

## 2014-05-15 DIAGNOSIS — K4090 Unilateral inguinal hernia, without obstruction or gangrene, not specified as recurrent: Principal | ICD-10-CM | POA: Insufficient documentation

## 2014-05-15 DIAGNOSIS — E785 Hyperlipidemia, unspecified: Secondary | ICD-10-CM | POA: Insufficient documentation

## 2014-05-15 DIAGNOSIS — Z7982 Long term (current) use of aspirin: Secondary | ICD-10-CM | POA: Diagnosis not present

## 2014-05-15 DIAGNOSIS — E039 Hypothyroidism, unspecified: Secondary | ICD-10-CM | POA: Insufficient documentation

## 2014-05-15 DIAGNOSIS — I4891 Unspecified atrial fibrillation: Secondary | ICD-10-CM | POA: Insufficient documentation

## 2014-05-15 DIAGNOSIS — I252 Old myocardial infarction: Secondary | ICD-10-CM | POA: Insufficient documentation

## 2014-05-15 DIAGNOSIS — Z951 Presence of aortocoronary bypass graft: Secondary | ICD-10-CM | POA: Diagnosis not present

## 2014-05-15 DIAGNOSIS — I5022 Chronic systolic (congestive) heart failure: Secondary | ICD-10-CM | POA: Diagnosis not present

## 2014-05-15 DIAGNOSIS — I2589 Other forms of chronic ischemic heart disease: Secondary | ICD-10-CM | POA: Diagnosis not present

## 2014-05-15 DIAGNOSIS — Z79899 Other long term (current) drug therapy: Secondary | ICD-10-CM | POA: Insufficient documentation

## 2014-05-15 DIAGNOSIS — I509 Heart failure, unspecified: Secondary | ICD-10-CM | POA: Diagnosis not present

## 2014-05-15 DIAGNOSIS — G8918 Other acute postprocedural pain: Secondary | ICD-10-CM | POA: Diagnosis not present

## 2014-05-15 DIAGNOSIS — Z8719 Personal history of other diseases of the digestive system: Secondary | ICD-10-CM

## 2014-05-15 HISTORY — PX: INGUINAL HERNIA REPAIR: SHX194

## 2014-05-15 HISTORY — PX: INSERTION OF MESH: SHX5868

## 2014-05-15 SURGERY — REPAIR, HERNIA, INGUINAL, ADULT
Anesthesia: General | Site: Groin | Laterality: Left

## 2014-05-15 MED ORDER — FENTANYL CITRATE 0.05 MG/ML IJ SOLN
50.0000 ug | Freq: Once | INTRAMUSCULAR | Status: AC
  Administered 2014-05-15: 50 ug via INTRAVENOUS

## 2014-05-15 MED ORDER — LISINOPRIL 5 MG PO TABS
5.0000 mg | ORAL_TABLET | Freq: Every day | ORAL | Status: DC
  Administered 2014-05-15: 5 mg via ORAL
  Filled 2014-05-15 (×2): qty 1

## 2014-05-15 MED ORDER — FENTANYL CITRATE 0.05 MG/ML IJ SOLN
INTRAMUSCULAR | Status: AC
  Filled 2014-05-15: qty 5

## 2014-05-15 MED ORDER — HYDROMORPHONE HCL PF 1 MG/ML IJ SOLN
INTRAMUSCULAR | Status: AC
  Administered 2014-05-15: 0.5 mg via INTRAVENOUS
  Filled 2014-05-15: qty 1

## 2014-05-15 MED ORDER — KCL IN DEXTROSE-NACL 20-5-0.45 MEQ/L-%-% IV SOLN
INTRAVENOUS | Status: DC
  Administered 2014-05-15 (×2): via INTRAVENOUS
  Filled 2014-05-15: qty 1000

## 2014-05-15 MED ORDER — OXYCODONE HCL 5 MG/5ML PO SOLN: 5.0000 mg | Freq: Once | ORAL | Status: AC | PRN

## 2014-05-15 MED ORDER — GEMFIBROZIL 600 MG PO TABS
600.0000 mg | ORAL_TABLET | Freq: Two times a day (BID) | ORAL | Status: DC
  Administered 2014-05-15 – 2014-05-16 (×2): 600 mg via ORAL
  Filled 2014-05-15 (×4): qty 1

## 2014-05-15 MED ORDER — PROPOFOL 10 MG/ML IV BOLUS
INTRAVENOUS | Status: DC | PRN
Start: 2014-05-15 — End: 2014-05-15
  Administered 2014-05-15: 150 mg via INTRAVENOUS

## 2014-05-15 MED ORDER — MEPERIDINE HCL 25 MG/ML IJ SOLN: 6.2500 mg | INTRAMUSCULAR | Status: DC | PRN

## 2014-05-15 MED ORDER — MIDAZOLAM HCL 2 MG/2ML IJ SOLN
INTRAMUSCULAR | Status: AC
  Filled 2014-05-15: qty 2

## 2014-05-15 MED ORDER — BUPIVACAINE-EPINEPHRINE 0.25% -1:200000 IJ SOLN
INTRAMUSCULAR | Status: DC | PRN
  Administered 2014-05-15: 10 mL

## 2014-05-15 MED ORDER — ONDANSETRON HCL 4 MG/2ML IJ SOLN
INTRAMUSCULAR | Status: DC | PRN
Start: 2014-05-15 — End: 2014-05-15
  Administered 2014-05-15: 4 mg via INTRAVENOUS

## 2014-05-15 MED ORDER — ROCURONIUM BROMIDE 50 MG/5ML IV SOLN
INTRAVENOUS | Status: AC
  Filled 2014-05-15: qty 1

## 2014-05-15 MED ORDER — LACTATED RINGERS IV SOLN
INTRAVENOUS | Status: DC | PRN
Start: 2014-05-15 — End: 2014-05-15
  Administered 2014-05-15: 13:00:00 via INTRAVENOUS

## 2014-05-15 MED ORDER — OXYCODONE-ACETAMINOPHEN 5-325 MG PO TABS: 1.0000 | ORAL_TABLET | ORAL | Status: DC | PRN

## 2014-05-15 MED ORDER — FENTANYL CITRATE 0.05 MG/ML IJ SOLN
INTRAMUSCULAR | Status: DC | PRN
  Administered 2014-05-15 (×2): 25 ug via INTRAVENOUS
  Administered 2014-05-15: 50 ug via INTRAVENOUS

## 2014-05-15 MED ORDER — ONDANSETRON HCL 4 MG/2ML IJ SOLN: 4.0000 mg | Freq: Four times a day (QID) | INTRAMUSCULAR | Status: DC | PRN

## 2014-05-15 MED ORDER — TAMSULOSIN HCL 0.4 MG PO CAPS
0.4000 mg | ORAL_CAPSULE | Freq: Every day | ORAL | Status: DC
  Administered 2014-05-15 – 2014-05-16 (×2): 0.4 mg via ORAL
  Filled 2014-05-15 (×2): qty 1

## 2014-05-15 MED ORDER — BUPIVACAINE-EPINEPHRINE (PF) 0.5% -1:200000 IJ SOLN
INTRAMUSCULAR | Status: DC | PRN
Start: 2014-05-15 — End: 2014-05-15
  Administered 2014-05-15: 30 mL

## 2014-05-15 MED ORDER — AMIODARONE HCL 100 MG PO TABS
100.0000 mg | ORAL_TABLET | Freq: Every day | ORAL | Status: DC
  Administered 2014-05-16: 100 mg via ORAL
  Filled 2014-05-15: qty 1

## 2014-05-15 MED ORDER — 0.9 % SODIUM CHLORIDE (POUR BTL) OPTIME
TOPICAL | Status: DC | PRN
  Administered 2014-05-15: 1000 mL

## 2014-05-15 MED ORDER — CARVEDILOL 6.25 MG PO TABS
6.2500 mg | ORAL_TABLET | Freq: Two times a day (BID) | ORAL | Status: DC
  Administered 2014-05-16: 6.25 mg via ORAL
  Filled 2014-05-15 (×3): qty 1

## 2014-05-15 MED ORDER — LIDOCAINE HCL (CARDIAC) 20 MG/ML IV SOLN
INTRAVENOUS | Status: DC | PRN
  Administered 2014-05-15: 50 mg via INTRAVENOUS

## 2014-05-15 MED ORDER — OXYCODONE HCL 5 MG PO TABS
5.0000 mg | ORAL_TABLET | Freq: Once | ORAL | Status: AC | PRN
  Administered 2014-05-15: 5 mg via ORAL

## 2014-05-15 MED ORDER — MIDAZOLAM HCL 5 MG/ML IJ SOLN: 1.0000 mg | Freq: Once | INTRAMUSCULAR | Status: DC

## 2014-05-15 MED ORDER — CEFAZOLIN SODIUM-DEXTROSE 2-3 GM-% IV SOLR
INTRAVENOUS | Status: AC
  Filled 2014-05-15: qty 50

## 2014-05-15 MED ORDER — MIDAZOLAM HCL 2 MG/2ML IJ SOLN
INTRAMUSCULAR | Status: AC
  Administered 2014-05-15: 1 mg
  Filled 2014-05-15: qty 2

## 2014-05-15 MED ORDER — SPIRONOLACTONE 25 MG PO TABS
25.0000 mg | ORAL_TABLET | Freq: Every day | ORAL | Status: DC
  Administered 2014-05-16: 25 mg via ORAL
  Filled 2014-05-15: qty 1

## 2014-05-15 MED ORDER — ONDANSETRON HCL 4 MG PO TABS: 4.0000 mg | ORAL_TABLET | Freq: Four times a day (QID) | ORAL | Status: DC | PRN

## 2014-05-15 MED ORDER — MORPHINE SULFATE 2 MG/ML IJ SOLN
2.0000 mg | INTRAMUSCULAR | Status: DC | PRN
Start: 2014-05-15 — End: 2014-05-16

## 2014-05-15 MED ORDER — FENTANYL CITRATE 0.05 MG/ML IJ SOLN
INTRAMUSCULAR | Status: AC
  Filled 2014-05-15: qty 2

## 2014-05-15 MED ORDER — LIDOCAINE HCL (CARDIAC) 20 MG/ML IV SOLN
INTRAVENOUS | Status: AC
  Filled 2014-05-15: qty 5

## 2014-05-15 MED ORDER — NITROGLYCERIN 0.4 MG SL SUBL: 0.4000 mg | SUBLINGUAL_TABLET | SUBLINGUAL | Status: DC | PRN

## 2014-05-15 MED ORDER — HYDROMORPHONE HCL PF 1 MG/ML IJ SOLN
0.2500 mg | INTRAMUSCULAR | Status: DC | PRN
  Administered 2014-05-15: 0.5 mg via INTRAVENOUS
  Administered 2014-05-15 (×2): 0.25 mg via INTRAVENOUS

## 2014-05-15 MED ORDER — OXYCODONE HCL 5 MG PO TABS
ORAL_TABLET | ORAL | Status: AC
  Administered 2014-05-15: 5 mg via ORAL
  Filled 2014-05-15: qty 1

## 2014-05-15 MED ORDER — LACTATED RINGERS IV SOLN
INTRAVENOUS | Status: DC
  Administered 2014-05-15: 13:00:00 via INTRAVENOUS

## 2014-05-15 MED ORDER — HEPARIN SODIUM (PORCINE) 5000 UNIT/ML IJ SOLN
5000.0000 [IU] | Freq: Three times a day (TID) | INTRAMUSCULAR | Status: DC
  Administered 2014-05-16: 5000 [IU] via SUBCUTANEOUS
  Filled 2014-05-15 (×4): qty 1

## 2014-05-15 MED ORDER — LEVOTHYROXINE SODIUM 50 MCG PO TABS
50.0000 ug | ORAL_TABLET | Freq: Every day | ORAL | Status: DC
  Administered 2014-05-16: 50 ug via ORAL
  Filled 2014-05-15 (×2): qty 1

## 2014-05-15 MED ORDER — PROPOFOL 10 MG/ML IV BOLUS
INTRAVENOUS | Status: AC
  Filled 2014-05-15: qty 20

## 2014-05-15 MED ORDER — ONDANSETRON HCL 4 MG/2ML IJ SOLN: 4.0000 mg | Freq: Once | INTRAMUSCULAR | Status: DC | PRN

## 2014-05-15 SURGICAL SUPPLY — 53 items
ADH SKN CLS APL DERMABOND .7 (GAUZE/BANDAGES/DRESSINGS) ×1
BLADE SURG 10 STRL SS (BLADE) ×3 IMPLANT
BLADE SURG 15 STRL LF DISP TIS (BLADE) ×1 IMPLANT
BLADE SURG 15 STRL SS (BLADE) ×3
BLADE SURG ROTATE 9660 (MISCELLANEOUS) ×3 IMPLANT
CANISTER SUCTION 2500CC (MISCELLANEOUS) ×3 IMPLANT
CHLORAPREP W/TINT 26ML (MISCELLANEOUS) ×3 IMPLANT
COVER SURGICAL LIGHT HANDLE (MISCELLANEOUS) ×3 IMPLANT
DECANTER SPIKE VIAL GLASS SM (MISCELLANEOUS) IMPLANT
DERMABOND ADVANCED (GAUZE/BANDAGES/DRESSINGS) ×2
DERMABOND ADVANCED .7 DNX12 (GAUZE/BANDAGES/DRESSINGS) ×1 IMPLANT
DRAIN PENROSE 1/2X12 LTX STRL (WOUND CARE) ×3 IMPLANT
DRAPE PED LAPAROTOMY (DRAPES) ×3 IMPLANT
DRAPE UTILITY 15X26 W/TAPE STR (DRAPE) ×6 IMPLANT
ELECT CAUTERY BLADE 6.4 (BLADE) ×3 IMPLANT
ELECT REM PT RETURN 9FT ADLT (ELECTROSURGICAL) ×3
ELECTRODE REM PT RTRN 9FT ADLT (ELECTROSURGICAL) ×1 IMPLANT
GLOVE BIO SURGEON STRL SZ8 (GLOVE) ×3 IMPLANT
GLOVE BIOGEL PI IND STRL 7.0 (GLOVE) ×1 IMPLANT
GLOVE BIOGEL PI IND STRL 8 (GLOVE) ×1 IMPLANT
GLOVE BIOGEL PI INDICATOR 7.0 (GLOVE) ×2
GLOVE BIOGEL PI INDICATOR 8 (GLOVE) ×2
GLOVE SURG SS PI 7.0 STRL IVOR (GLOVE) ×3 IMPLANT
GOWN STRL REUS W/ TWL LRG LVL3 (GOWN DISPOSABLE) ×1 IMPLANT
GOWN STRL REUS W/ TWL XL LVL3 (GOWN DISPOSABLE) ×1 IMPLANT
GOWN STRL REUS W/TWL LRG LVL3 (GOWN DISPOSABLE) ×3
GOWN STRL REUS W/TWL XL LVL3 (GOWN DISPOSABLE) ×3
KIT BASIN OR (CUSTOM PROCEDURE TRAY) ×3 IMPLANT
KIT ROOM TURNOVER OR (KITS) ×3 IMPLANT
MESH HERNIA 3X6 (Mesh General) ×3 IMPLANT
NEEDLE 22X1 1/2 (OR ONLY) (NEEDLE) ×3 IMPLANT
NS IRRIG 1000ML POUR BTL (IV SOLUTION) ×3 IMPLANT
PACK SURGICAL SETUP 50X90 (CUSTOM PROCEDURE TRAY) ×3 IMPLANT
PAD ARMBOARD 7.5X6 YLW CONV (MISCELLANEOUS) ×3 IMPLANT
PENCIL BUTTON HOLSTER BLD 10FT (ELECTRODE) ×3 IMPLANT
PLUG ATRIUM AND PATCH X LRG (MISCELLANEOUS) IMPLANT
SPECIMEN JAR SMALL (MISCELLANEOUS) IMPLANT
SPONGE LAP 18X18 X RAY DECT (DISPOSABLE) ×3 IMPLANT
SUT MNCRL AB 4-0 PS2 18 (SUTURE) ×3 IMPLANT
SUT PROLENE 2 0 CT2 30 (SUTURE) ×12 IMPLANT
SUT VIC AB 2-0 SH 27 (SUTURE) ×6
SUT VIC AB 2-0 SH 27X BRD (SUTURE) ×2 IMPLANT
SUT VIC AB 3-0 SH 27 (SUTURE) ×3
SUT VIC AB 3-0 SH 27X BRD (SUTURE) ×1 IMPLANT
SUT VICRYL AB 3 0 TIES (SUTURE) IMPLANT
SYR BULB 3OZ (MISCELLANEOUS) ×3 IMPLANT
SYR CONTROL 10ML LL (SYRINGE) ×3 IMPLANT
TOWEL OR 17X24 6PK STRL BLUE (TOWEL DISPOSABLE) IMPLANT
TOWEL OR 17X26 10 PK STRL BLUE (TOWEL DISPOSABLE) ×3 IMPLANT
TUBE CONNECTING 12'X1/4 (SUCTIONS)
TUBE CONNECTING 12X1/4 (SUCTIONS) IMPLANT
WATER STERILE IRR 1000ML POUR (IV SOLUTION) IMPLANT
YANKAUER SUCT BULB TIP NO VENT (SUCTIONS) IMPLANT

## 2014-05-15 NOTE — Op Note (Signed)
05/15/2014  2:30 PM  PATIENT:  [name]  79 y.o. male  PRE-OPERATIVE DIAGNOSIS:  LEFT INGUINAL HERNIA  POST-OPERATIVE DIAGNOSIS:  LEFT INGUINAL HERNIA  PROCEDURE:  Procedure(s): HERNIA REPAIR INGUINAL ADULT INSERTION OF MESH  SURGEON:  Surgeon(s): Zenovia Jarred, MD  ASSISTANTS: none   ANESTHESIA:   local, general and TAP block  EBL:     BLOOD ADMINISTERED:none  DRAINS: none   SPECIMEN:  No Specimen  DISPOSITION OF SPECIMEN:  N/A  COUNTS:  YES  DICTATION: .Dragon Dictation  Patient presents for Left inguinal hernia repair with mesh.Cardiac clearance was obtained. He was identified in the preop area. His site was marked. Anesthesia placed a tap block. He received intravenous antibiotics. Informed consent was obtained. He was brought to the operating room and general anesthesia with laryngeal mask airway was administered by the anesthesia staff. Lower abdomen and groins were prepped and draped in sterile fashion. Local anesthetic was injected in the left inguinal region. Left inguinal incision was made. Subcutaneous tissues were dissected down through Scarpa's fascia. Hemostasis was obtained with cautery. External oblique fascia was revealed. This was divided laterally. The division was continued down through the external ring. Cord structures were encircled with a Penrose drain. Dissection of the cord revealed a small direct hernia with a weak floor. Additionally, an indirect hernia sac was identified. This was dissected free off the cord structures, high ligated with 2-0 Vicryl, and excised. The direct hernia was kept reduced with a couple interrupted 2-0 Vicryls from the transversalis to the shelving edge of inguinal ligament. Keyhole polypropylene mesh was cut to custom size and shape. Hernia repair was completed by tacking this with 2-0 Prolene to the tissues over the pubic tubercle medially. Inferiorly it Was sewn to the shelving edge of inguinal ligament with running  2-0 Prolene. Superiorly, interrupted 2-0 Prolene for used to tack it into the tissues over the pubic tubercle, and out along the transversalis. The 2 leaflets of the mesh were rejoined behind the cord and tucked out laterally underneath the external oblique fascia. They were tacked together and to the underlying musculature with interrupted 2-0 Prolene. Area was copiously irrigated. Hemostasis was ensured. External oblique was closed with running 2-0 Vicryl. Subcutaneous tissues were irrigated. Scarpa's fascia was closed with interrupted 3-0 Vicryl. Skin was closed with running 4-0 Monocryl subcuticular stitch followed by Dermabond. All counts were correct. Patient tolerated procedure well without apparent complication and was taken recovery in stable condition.  PATIENT DISPOSITION:  PACU - hemodynamically stable.   Delay start of Pharmacological VTE agent (>24hrs) due to surgical blood loss or risk of bleeding:  no  [name], MD, MPH, FACS Pager: [phone]  5/18/20152:30 PM

## 2014-05-15 NOTE — Anesthesia Preprocedure Evaluation (Signed)
Anesthesia Evaluation  Patient identified by MRN, date of birth, ID band Patient awake    Reviewed: Allergy & Precautions, H&P , NPO status , Patient's Chart, lab work & pertinent test results  Airway Mallampati: I TM Distance: >3 FB Neck ROM: Full    Dental   Pulmonary          Cardiovascular hypertension, Pt. on medications + Past MI and +CHF + dysrhythmias Ventricular Fibrillation + Cardiac Defibrillator     Neuro/Psych    GI/Hepatic hiatal hernia,   Endo/Other  Hypothyroidism   Renal/GU      Musculoskeletal   Abdominal   Peds  Hematology   Anesthesia Other Findings   Reproductive/Obstetrics                           Anesthesia Physical Anesthesia Plan  ASA: III  Anesthesia Plan: General   Post-op Pain Management:    Induction: Intravenous  Airway Management Planned: LMA  Additional Equipment:   Intra-op Plan:   Post-operative Plan: Extubation in OR  Informed Consent: I have reviewed the patients History and Physical, chart, labs and discussed the procedure including the risks, benefits and alternatives for the proposed anesthesia with the patient or authorized representative who has indicated his/her understanding and acceptance.     Plan Discussed with: CRNA and Surgeon  Anesthesia Plan Comments:         Anesthesia Quick Evaluation

## 2014-05-15 NOTE — Transfer of Care (Signed)
Immediate Anesthesia Transfer of Care Note  Patient: [name]  Procedure(s) Performed: Procedure(s): HERNIA REPAIR INGUINAL ADULT (Left) INSERTION OF MESH (Left)  Patient Location: PACU  Anesthesia Type:General  Level of Consciousness: awake and alert   Airway & Oxygen Therapy: Patient Spontanous Breathing and Patient connected to nasal cannula oxygen  Post-op Assessment: Report given to PACU RN and Post -op Vital signs reviewed and stable  Post vital signs: Reviewed and stable  Complications: No apparent anesthesia complications

## 2014-05-15 NOTE — Anesthesia Postprocedure Evaluation (Signed)
  Anesthesia Post-op Note  Patient: [name]  Procedure(s) Performed: Procedure(s): HERNIA REPAIR INGUINAL ADULT (Left) INSERTION OF MESH (Left)  Patient Location: PACU  Anesthesia Type:General  Level of Consciousness: awake, oriented, sedated and patient cooperative  Airway and Oxygen Therapy: Patient Spontanous Breathing  Post-op Pain: mild  Post-op Assessment: Post-op Vital signs reviewed, Patient's Cardiovascular Status Stable, Respiratory Function Stable, Patent Airway, No signs of Nausea or vomiting and Pain level controlled  Post-op Vital Signs: stable  Last Vitals:  Filed Vitals:   05/15/14 1500  BP: 126/66  Pulse: 60  Temp:   Resp: 12    Complications: No apparent anesthesia complications

## 2014-05-15 NOTE — H&P (Signed)
HPI  [name] is a 79 y.o. male. Chief complaint: Left inguinal hernia  HPI  Patient has a history of left inguinal hernia for some time. More recently, he has some mild discomfort in the area. This usually is exacerbated by walking. His left inguinal mass goes in and out. Usually comes out when he is up walking for a period of time. No change in bowel habits. No difficulties with urination. No significant pain, just the discomfort.Of note, he is status post right inguinal hernia repair by Dr. Margot Chimes in 1995.  Past Medical History   Diagnosis  Date   .  Myocardial infarction      1995, non-STEMI January 2012   .  Ischemic cardiomyopathy      status post CABG with patent vein grafts and an atretic LIMA to his ramus, January 2012   .  Ventricular tachycardia      VT Storm-January 2012- responded to Amiodarone   .  Hypertension    .  Ventricular fibrillation    .  Chronic systolic congestive heart failure    .  Hyperlipidemia    .  Encounter for long-term (current) use of other medications      amiodarone   .  Atrial arrhythmia/a fibrillation      not on coumadin 2/2 subdural hematoma   .  Subdural hematoma      on coumadin   .  Automatic implantable cardiac defibrillator in situ      medtronic   .  CHF (congestive heart failure)    .  Thyroid disease      hypothyroid    Past Surgical History   Procedure  Laterality  Date   .  Insert / replace / remove pacemaker       [place] DR [id number]   .  Bilateral cranitomies for sub hematomas     .  Bypass graft   1995     5   .  Coronary artery bypass graft   1995     5   History reviewed. No pertinent family history.  Social History  History   Substance Use Topics   .  Smoking status:  Never Smoker   .  Smokeless tobacco:  Former Systems developer   .  Alcohol Use:  No   No Known Allergies  Current Outpatient Prescriptions   Medication  Sig  Dispense  Refill   .  amiodarone (PACERONE) 200 MG tablet  Take 0.5 tablets (100 mg  total) by mouth daily.  90 tablet  3   .  aspirin 81 MG tablet  Take 81 mg by mouth daily.     .  carvedilol (COREG) 6.25 MG tablet  Take 1 tablet (6.25 mg total) by mouth 2 (two) times daily with a meal.  180 tablet  3   .  gemfibrozil (LOPID) 600 MG tablet  Take 1 tablet (600 mg total) by mouth 2 (two) times daily before a meal.  60 tablet  6   .  levothyroxine (SYNTHROID, LEVOTHROID) 50 MCG tablet  Take 50 mcg by mouth daily.     [name] Kitchen  lisinopril (PRINIVIL,ZESTRIL) 5 MG tablet  Take 1 tablet (5 mg total) by mouth daily.  30 tablet  1   .  niacin (SLO-NIACIN) 500 MG tablet  Take 2,000 mg by mouth daily.     .  Tamsulosin HCl (FLOMAX) 0.4 MG CAPS  Take 1 tablet by mouth daily.     [name] Kitchen  nitroGLYCERIN (NITROSTAT) 0.4 MG SL tablet  Place 1 tablet (0.4 mg total) under the tongue every 5 (five) minutes as needed for chest pain. For chest pain x 3 doses  25 tablet  3   .  spironolactone (ALDACTONE) 25 MG tablet  TAKE 1 TABLET BY MOUTH ONCE A DAY .  90 tablet  3    No current facility-administered medications for this visit.   Review of Systems  Review of Systems  Constitutional: Negative.  HENT: Negative.  Eyes: Negative.  Respiratory: Negative.  Cardiovascular:  AICD and CHF  Gastrointestinal:  See history of present illness  Endocrine: Negative.  Genitourinary: Negative.  Allergic/Immunologic: Negative.  Neurological: Negative.  Hematological: Negative.  Blood pressure 116/76, pulse 80, temperature 97.6 F (36.4 C), temperature source Temporal, resp. rate 16, height 5\' 11"  (1.803 m), weight 156 lb 9.6 oz (71.033 kg).  Physical Exam  Physical Exam  Constitutional: He is oriented to person, place, and time. He appears well-developed and well-nourished.  HENT:  Head: Normocephalic and atraumatic.  Right Ear: External ear normal.  Left Ear: External ear normal.  Mouth/Throat: No oropharyngeal exudate.  Eyes: EOM are normal. Pupils are equal, round, and reactive to light. Right eye exhibits  no discharge. Left eye exhibits no discharge.  Neck: Normal range of motion. Neck supple. No tracheal deviation present.  Cardiovascular: Normal rate and normal heart sounds.  AICD left chest  Pulmonary/Chest: Effort normal and breath sounds normal. No stridor. No respiratory distress. He has no wheezes. He has no rales.  Abdominal: Soft. Bowel sounds are normal. He exhibits no distension. There is no tenderness. There is no rebound and no guarding.  Genitourinary:  Testes are descended, no evidence of right inguinal hernia, left inguinal hernia is present, it easily reduces but spontaneously recurs. It does not extend down into his scrotum. No pain on reduction or examination.  Musculoskeletal: Normal range of motion. He exhibits no tenderness.  Neurological: He is alert and oriented to person, place, and time. He exhibits normal muscle tone. Coordination normal.  Skin: Skin is warm and dry.  Psychiatric: He has a normal mood and affect.  Data Reviewed  Office visit with Dr. Reynaldo Minium and Dr. Caryl Comes  Assessment  Symptomatic left inguinal hernia  Plan  I have offered left inguinal hernia repair with mesh. We will need preoperative clearance from Dr. Caryl Comes in order to turn off his AICD during surgery and regarding his CHF. We'll plan an overnight stay with telemetry monitoring. Procedure, risks, and benefits were discussed in detail with the patient. We will plan to schedule once we were back from Dr. Caryl Comes.  Cardiac clearance obtained [name], MD, MPH, FACS Trauma: [phone] General Surgery: [phone]

## 2014-05-15 NOTE — Interval H&P Note (Signed)
History and Physical Interval Note:  05/15/2014 12:44 PM  [name]  has presented today for surgery, with the diagnosis of left inguinal hernia   The various methods of treatment have been discussed with the patient and family. After consideration of risks, benefits and other options for treatment, the patient has consented to  Procedure(s): HERNIA REPAIR INGUINAL ADULT (Left) INSERTION OF MESH (Left) as a surgical intervention .  The patient's history has been reviewed, patient re-examined, site marked, no change in status, stable for surgery.  I have reviewed the patient's chart and labs.  Questions were answered to the patient's satisfaction.     Zenovia Jarred

## 2014-05-15 NOTE — Anesthesia Procedure Notes (Addendum)
Anesthesia Regional Block:  TAP block  Pre-Anesthetic Checklist: ,, timeout performed, Correct Patient, Correct Site, Correct Laterality, Correct Procedure, Correct Position, site marked, Risks and benefits discussed,  Surgical consent,  Pre-op evaluation,  At surgeon's request and post-op pain management  Laterality: Left  Prep: chloraprep and alcohol swabs       Needles:  Injection technique: Single-shot  Needle Type: Echogenic Stimulator Needle     Needle Length: 9cm 9 cm Needle Gauge: 21 and 21 G    Additional Needles:  Procedures: ultrasound guided (picture in chart) TAP block Narrative:  Start time: 05/15/2014 1:00 PM End time: 05/15/2014 1:15 PM Injection made incrementally with aspirations every 5 mL.  Performed by: Personally  Anesthesiologist: Lillia Abed MD  Additional Notes: Monitors applied. Patient sedated. Sterile prep and drape,hand hygiene and sterile gloves were used. Relevant anatomy identified.Needle position confirmed.Local anesthetic injected incrementally after negative aspiration. Local anesthetic spread visualized in Transversus Abdominus Plane. Vascular puncture avoided. No complications. Image printed for medical record.The patient tolerated the procedure well.

## 2014-05-16 ENCOUNTER — Encounter (HOSPITAL_COMMUNITY): Payer: Self-pay | Admitting: General Surgery

## 2014-05-16 LAB — CBC
HCT: 41.2 % (ref 39.0–52.0)
Hemoglobin: 14.1 g/dL (ref 13.0–17.0)
MCH: 33.4 pg (ref 26.0–34.0)
MCHC: 34.2 g/dL (ref 30.0–36.0)
MCV: 97.6 fL (ref 78.0–100.0)
Platelets: 132 10*3/uL — ABNORMAL LOW (ref 150–400)
RBC: 4.22 MIL/uL (ref 4.22–5.81)
RDW: 13.8 % (ref 11.5–15.5)
WBC: 6.7 10*3/uL (ref 4.0–10.5)

## 2014-05-16 LAB — BASIC METABOLIC PANEL
BUN: 10 mg/dL (ref 6–23)
CALCIUM: 8.2 mg/dL — AB (ref 8.4–10.5)
CO2: 24 mEq/L (ref 19–32)
CREATININE: 0.91 mg/dL (ref 0.50–1.35)
Chloride: 106 mEq/L (ref 96–112)
GFR calc non Af Amer: 80 mL/min — ABNORMAL LOW (ref >90)
Glucose, Bld: 88 mg/dL (ref 70–99)
Potassium: 4.2 mEq/L (ref 3.7–5.3)
Sodium: 140 mEq/L (ref 137–147)

## 2014-05-16 MED ORDER — OXYCODONE-ACETAMINOPHEN 5-325 MG PO TABS: 1.0000 | ORAL_TABLET | Freq: Four times a day (QID) | ORAL | Status: DC | PRN

## 2014-05-16 NOTE — Discharge Summary (Signed)
Physician Discharge Summary  Patient ID: [name] MRN: [id number] DOB/AGE: Jun 28, 1935 79 y.o.  Admit date: 05/15/2014 Discharge date: 05/16/2014  Admission Diagnoses: Left inguinal hernia  Discharge Diagnoses: Status post left inguinal hernia repair with mesh Active Problems:   S/P inguinal hernia repair   Discharged Condition: good  Hospital Course: Patient was admitted and underwent left hernia repair with mesh. Postoperatively, he remained hemodynamically stable. He tolerated advancement of his diet and was able to pass his urine. He had good pain control and is discharged home in good condition on postoperative day one  Consults: None  Significant Diagnostic Studies: none  Treatments: surgery: above  Discharge Exam: Blood pressure 95/56, pulse 60, temperature 97.8 F (36.6 C), temperature source Oral, resp. rate 16, height 5\' 11"  (1.803 m), weight 156 lb (70.761 kg), SpO2 98.00%. General appearance: alert and cooperative Resp: clear to auscultation bilaterally Cardio: regular rate and rhythm GI: incision L groin CDI, repair intact  Disposition: 01-Home or Self Care  Discharge Instructions   Diet - low sodium heart healthy    Complete by:  As directed      Discharge instructions    Complete by:  As directed   CC[facility] _______[facility]  UMBILICAL OR INGUINAL HERNIA REPAIR: POST OP INSTRUCTIONS  Always review your discharge instruction sheet given to you by the facility where your surgery was performed. IF YOU HAVE DISABILITY OR FAMILY LEAVE FORMS, YOU MUST BRING THEM TO THE OFFICE FOR PROCESSING.   DO NOT GIVE THEM TO YOUR DOCTOR.  A  prescription for pain medication may be given to you upon discharge.  Take your pain medication as prescribed, if needed.  If narcotic pain medicine is not needed, then you may take acetaminophen (Tylenol) or ibuprofen (Advil) as needed. Take your usually prescribed medications unless otherwise directed. If you need  a refill on your pain medication, please contact your pharmacy.  They will contact our office to request authorization. Prescriptions will not be filled after 5 pm or on week-ends. You should follow a light diet the first 24 hours after arrival home, such as soup and crackers, etc.  Be sure to include lots of fluids daily.  Resume your normal diet the day after surgery. Most patients will experience some swelling and bruising around the umbilicus or in the groin and scrotum.  Ice packs and reclining will help.  Swelling and bruising can take several days to resolve.  It is common to experience some constipation if taking pain medication after surgery.  Increasing fluid intake and taking a stool softener (such as Colace) will usually help or prevent this problem from occurring.  A mild laxative (Milk of Magnesia or Miralax) should be taken according to package directions if there are no bowel movements after 48 hours. Unless discharge instructions indicate otherwise, you may remove your bandages 24-48 hours after surgery, and you may shower at that time.  You may have steri-strips (small skin tapes) in place directly over the incision.  These strips should be left on the skin for 7-10 days.  If your surgeon used skin glue on the incision, you may shower in 24 hours.  The glue will flake off over the next 2-3 weeks.  Any sutures or staples will be removed at the office during your follow-up visit. ACTIVITIES:  You may resume regular (light) daily activities beginning the next day-such as daily self-care, walking, climbing stairs-gradually increasing activities as tolerated.  You may have sexual intercourse when it is  comfortable.  Refrain from any heavy lifting or straining until approved by your doctor. You may drive when you are no longer taking prescription pain medication, you can comfortably wear a seatbelt, and you can safely maneuver your car and apply brakes. RETURN TO WORK:   __________________________________________________________ [name] should see your doctor in the office for a follow-up appointment approximately 2-3 weeks after your surgery.  Make sure that you call for this appointment within a day or two after you arrive home to insure a convenient appointment time. OTHER INSTRUCTIONS:  __________________________________________________________________________________________________________________________________________________________________________________________  WHEN TO CALL YOUR DOCTOR: Fever over 101.0 Inability to urinate Nausea and/or vomiting Extreme swelling or bruising Continued bleeding from incision. Increased pain, redness, or drainage from the incision  The clinic staff is available to answer your questions during regular business hours.  Please don't hesitate to call and ask to speak to one of the nurses for clinical concerns.  If you have a medical emergency, go to the nearest emergency room or call 911.  A surgeon from [facility] is always on call at the hospital   [address] [place], [place], [place]  [address] ?  [address], [place], [place]   [address] [phone] ? [phone] ? FAX [phone] Web site: [email or web address]     Increase activity slowly    Complete by:  As directed      Lifting restrictions    Complete by:  As directed   Do not lift over 10 pounds for 6 weeks. Walking short distances is okay.     No dressing needed    Complete by:  As directed             Medication List         amiodarone 200 MG tablet  Commonly known as:  PACERONE  Take 0.5 tablets (100 mg total) by mouth daily.     aspirin 81 MG tablet  Take 81 mg by mouth daily.     carvedilol 6.25 MG tablet  Commonly known as:  COREG  Take 1 tablet (6.25 mg total) by mouth 2 (two) times daily with a meal.     gemfibrozil 600 MG tablet  Commonly known as:  LOPID  Take 1 tablet (600 mg total) by  mouth 2 (two) times daily before a meal.     levothyroxine 50 MCG tablet  Commonly known as:  SYNTHROID, LEVOTHROID  Take 50 mcg by mouth daily.     lisinopril 5 MG tablet  Commonly known as:  PRINIVIL,ZESTRIL  Take 1 tablet (5 mg total) by mouth daily.     nitroGLYCERIN 0.4 MG SL tablet  Commonly known as:  NITROSTAT  Place 1 tablet (0.4 mg total) under the tongue every 5 (five) minutes as needed for chest pain. For chest pain x 3 doses     oxyCODONE-acetaminophen 5-325 MG per tablet  Commonly known as:  PERCOCET/ROXICET  Take 1-2 tablets by mouth every 6 (six) hours as needed (pain).     SLO-NIACIN 500 MG tablet  Generic drug:  niacin  Take 1,000-2,000 mg by mouth daily. Alternates with 1000 mg one day and 2000 mg the next     spironolactone 25 MG tablet  Commonly known as:  ALDACTONE  Take 25 mg by mouth daily.     tamsulosin 0.4 MG Caps capsule  Commonly known as:  FLOMAX  Take 1 tablet by mouth daily.           Follow-up Information  Follow up with Zenovia Jarred, MD. (as already scheduled)    Specialty:  General Surgery   Contact information:   [address] [place] [address] [phone]       Signed: Zenovia Jarred 05/16/2014, 7:35 AM

## 2014-05-31 ENCOUNTER — Encounter (INDEPENDENT_AMBULATORY_CARE_PROVIDER_SITE_OTHER): Payer: Self-pay | Admitting: General Surgery

## 2014-05-31 ENCOUNTER — Ambulatory Visit (INDEPENDENT_AMBULATORY_CARE_PROVIDER_SITE_OTHER): Payer: Medicare Other | Admitting: General Surgery

## 2014-05-31 VITALS — BP 104/74 | HR 70 | Temp 98.0°F | Resp 12 | Ht 71.0 in | Wt 158.6 lb

## 2014-05-31 DIAGNOSIS — Z9889 Other specified postprocedural states: Secondary | ICD-10-CM

## 2014-05-31 DIAGNOSIS — Z8719 Personal history of other diseases of the digestive system: Secondary | ICD-10-CM

## 2014-05-31 NOTE — Progress Notes (Signed)
Subjective:     Patient ID: [name], male   DOB: 1935/06/03, 79 y.o.   MRN: [id number]  HPI S/P L inguinal hernia repair with mesh on 05/15/2014. He is doing very well after surgery.He has resumed his walking regimen.  Review of Systems     Objective:   Physical Exam Incision is healing well, minimal swelling, no evidence of infection, hernia repair is intact, both testes are descended and nonedematous    Assessment:     Doing very well after left inguinal hernia repair with mesh    Plan:     No heavy lifting until after July 1, return when necessary

## 2014-05-31 NOTE — Patient Instructions (Signed)
No heavy lifting until July 1

## 2014-06-01 ENCOUNTER — Other Ambulatory Visit: Payer: Self-pay | Admitting: Cardiovascular Disease

## 2014-06-26 ENCOUNTER — Ambulatory Visit (INDEPENDENT_AMBULATORY_CARE_PROVIDER_SITE_OTHER): Payer: Medicare Other | Admitting: *Deleted

## 2014-06-26 ENCOUNTER — Telehealth: Payer: Self-pay | Admitting: Cardiology

## 2014-06-26 ENCOUNTER — Telehealth: Payer: Self-pay | Admitting: Internal Medicine

## 2014-06-26 DIAGNOSIS — I4729 Other ventricular tachycardia: Secondary | ICD-10-CM

## 2014-06-26 DIAGNOSIS — I472 Ventricular tachycardia, unspecified: Secondary | ICD-10-CM

## 2014-06-26 DIAGNOSIS — I509 Heart failure, unspecified: Secondary | ICD-10-CM | POA: Diagnosis not present

## 2014-06-26 DIAGNOSIS — I5022 Chronic systolic (congestive) heart failure: Secondary | ICD-10-CM

## 2014-06-26 LAB — MDC_IDC_ENUM_SESS_TYPE_REMOTE
Battery Remaining Longevity: 100 mo
Battery Voltage: 3.01 V
Brady Statistic AP VP Percent: 1.99 %
Date Time Interrogation Session: 20150629171725
HIGH POWER IMPEDANCE MEASURED VALUE: 171 Ohm
HighPow Impedance: 39 Ohm
HighPow Impedance: 55 Ohm
Lead Channel Impedance Value: 342 Ohm
Lead Channel Pacing Threshold Amplitude: 0.625 V
Lead Channel Pacing Threshold Amplitude: 0.75 V
Lead Channel Pacing Threshold Pulse Width: 0.4 ms
Lead Channel Sensing Intrinsic Amplitude: 1.375 mV
Lead Channel Sensing Intrinsic Amplitude: 10.75 mV
Lead Channel Sensing Intrinsic Amplitude: 10.75 mV
Lead Channel Setting Sensing Sensitivity: 0.3 mV
MDC IDC MSMT LEADCHNL RA SENSING INTR AMPL: 1.375 mV
MDC IDC MSMT LEADCHNL RV IMPEDANCE VALUE: 456 Ohm
MDC IDC MSMT LEADCHNL RV PACING THRESHOLD PULSEWIDTH: 0.4 ms
MDC IDC SET LEADCHNL RA PACING AMPLITUDE: 2 V
MDC IDC SET LEADCHNL RV PACING AMPLITUDE: 2.5 V
MDC IDC SET LEADCHNL RV PACING PULSEWIDTH: 0.4 ms
MDC IDC SET ZONE DETECTION INTERVAL: 350 ms
MDC IDC STAT BRADY AP VS PERCENT: 95.91 %
MDC IDC STAT BRADY AS VP PERCENT: 0.28 %
MDC IDC STAT BRADY AS VS PERCENT: 1.83 %
MDC IDC STAT BRADY RA PERCENT PACED: 97.89 %
MDC IDC STAT BRADY RV PERCENT PACED: 2.27 %
Zone Setting Detection Interval: 230 ms
Zone Setting Detection Interval: 300 ms
Zone Setting Detection Interval: 400 ms
Zone Setting Detection Interval: 450 ms

## 2014-06-26 NOTE — Telephone Encounter (Signed)
New Message:  Pt states a device tech called and spoke to his wife earlier.. States he has some more questions for her. According to Epic, it looks like Pamala Hurry called.[name] KitchenMarland Kitchen

## 2014-06-26 NOTE — Progress Notes (Signed)
Remote ICD transmission.   

## 2014-06-26 NOTE — Telephone Encounter (Signed)
Confirmed remote transmission with pt wife

## 2014-06-27 NOTE — Telephone Encounter (Signed)
LMOVM for pt to return call 

## 2014-06-28 NOTE — Telephone Encounter (Signed)
Spoke with pt and informed him that we received transmission on 06-26-14. I also explained to pt that for the most part the transmissions are automatic and that there was an error with the computers and that is why we needed a manual transmission. Pt verbalized understanding.

## 2014-07-03 ENCOUNTER — Other Ambulatory Visit: Payer: Self-pay | Admitting: Cardiovascular Disease

## 2014-07-07 ENCOUNTER — Telehealth: Payer: Self-pay | Admitting: Cardiovascular Disease

## 2014-07-07 ENCOUNTER — Other Ambulatory Visit: Payer: Self-pay

## 2014-07-07 MED ORDER — CARVEDILOL 6.25 MG PO TABS: 6.2500 mg | ORAL_TABLET | Freq: Two times a day (BID) | ORAL | Status: DC

## 2014-07-07 NOTE — Telephone Encounter (Signed)
New message     Refill carvedilol at [facility]--pt is out

## 2014-07-11 DIAGNOSIS — H1130 Conjunctival hemorrhage, unspecified eye: Secondary | ICD-10-CM | POA: Diagnosis not present

## 2014-07-12 ENCOUNTER — Encounter: Payer: Self-pay | Admitting: Cardiology

## 2014-07-14 ENCOUNTER — Encounter: Payer: Self-pay | Admitting: Cardiology

## 2014-07-18 ENCOUNTER — Encounter: Payer: Self-pay | Admitting: Internal Medicine

## 2014-07-26 ENCOUNTER — Ambulatory Visit (INDEPENDENT_AMBULATORY_CARE_PROVIDER_SITE_OTHER): Payer: Medicare Other | Admitting: Cardiovascular Disease

## 2014-07-26 ENCOUNTER — Encounter: Payer: Self-pay | Admitting: Cardiovascular Disease

## 2014-07-26 VITALS — BP 92/60 | HR 66 | Ht 71.0 in | Wt 154.4 lb

## 2014-07-26 DIAGNOSIS — I509 Heart failure, unspecified: Secondary | ICD-10-CM

## 2014-07-26 DIAGNOSIS — I2589 Other forms of chronic ischemic heart disease: Secondary | ICD-10-CM | POA: Diagnosis not present

## 2014-07-26 DIAGNOSIS — I5022 Chronic systolic (congestive) heart failure: Secondary | ICD-10-CM

## 2014-07-26 DIAGNOSIS — E785 Hyperlipidemia, unspecified: Secondary | ICD-10-CM | POA: Diagnosis not present

## 2014-07-26 MED ORDER — FENOFIBRATE 160 MG PO TABS: 160.0000 mg | ORAL_TABLET | Freq: Every day | ORAL | Status: DC

## 2014-07-26 NOTE — Assessment & Plan Note (Signed)
[name]  has been on Gemfibrozil for years.  I would like to change him to fenofibrte 160 mg a day.  Will recheck lipids in 3 months. We may consider referring him to the lipid clinic.

## 2014-07-26 NOTE — Patient Instructions (Addendum)
Your physician has recommended you make the following change in your medication:  STOP Gemfibrozil START Fenofibrate 160 mg once daily  Your physician recommends that you return for lab work in: 3 months - you will need to fast for this appointment (nothing except water after midnight the night before) - Tuesday 11/3 at 9:00 am  Your physician wants you to follow-up in: 6 months with Dr. Acie Fredrickson.  You will receive a reminder letter in the mail two months in advance. If you don't receive a letter, please call our office to schedule the follow-up appointment.
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
## 2014-07-26 NOTE — Assessment & Plan Note (Signed)
[name]  seems to be doing well. His heart failure symptoms are very well controlled. Continue with same medications. [name] again in 6 months.

## 2014-08-15 ENCOUNTER — Other Ambulatory Visit: Payer: Self-pay | Admitting: Internal Medicine

## 2014-08-31 DIAGNOSIS — L819 Disorder of pigmentation, unspecified: Secondary | ICD-10-CM | POA: Diagnosis not present

## 2014-08-31 DIAGNOSIS — Z85828 Personal history of other malignant neoplasm of skin: Secondary | ICD-10-CM | POA: Diagnosis not present

## 2014-08-31 DIAGNOSIS — L821 Other seborrheic keratosis: Secondary | ICD-10-CM | POA: Diagnosis not present

## 2014-08-31 DIAGNOSIS — L570 Actinic keratosis: Secondary | ICD-10-CM | POA: Diagnosis not present

## 2014-09-22 ENCOUNTER — Other Ambulatory Visit: Payer: Self-pay | Admitting: Cardiovascular Disease

## 2014-09-25 ENCOUNTER — Ambulatory Visit (INDEPENDENT_AMBULATORY_CARE_PROVIDER_SITE_OTHER): Payer: Medicare Other | Admitting: *Deleted

## 2014-09-25 ENCOUNTER — Telehealth: Payer: Self-pay | Admitting: Cardiology

## 2014-09-25 ENCOUNTER — Encounter: Payer: Self-pay | Admitting: Internal Medicine

## 2014-09-25 DIAGNOSIS — I4901 Ventricular fibrillation: Secondary | ICD-10-CM | POA: Diagnosis not present

## 2014-09-25 DIAGNOSIS — I5022 Chronic systolic (congestive) heart failure: Secondary | ICD-10-CM | POA: Diagnosis not present

## 2014-09-25 DIAGNOSIS — I509 Heart failure, unspecified: Secondary | ICD-10-CM | POA: Diagnosis not present

## 2014-09-25 LAB — MDC_IDC_ENUM_SESS_TYPE_REMOTE
Battery Voltage: 3.01 V
Brady Statistic AP VS Percent: 94.97 %
Brady Statistic RA Percent Paced: 96.3 %
HIGH POWER IMPEDANCE MEASURED VALUE: 171 Ohm
HIGH POWER IMPEDANCE MEASURED VALUE: 39 Ohm
HighPow Impedance: 56 Ohm
Lead Channel Impedance Value: 380 Ohm
Lead Channel Impedance Value: 513 Ohm
Lead Channel Pacing Threshold Pulse Width: 0.4 ms
Lead Channel Pacing Threshold Pulse Width: 0.4 ms
Lead Channel Sensing Intrinsic Amplitude: 1.125 mV
Lead Channel Sensing Intrinsic Amplitude: 1.125 mV
Lead Channel Sensing Intrinsic Amplitude: 6.375 mV
Lead Channel Setting Pacing Amplitude: 2 V
Lead Channel Setting Pacing Pulse Width: 0.4 ms
MDC IDC MSMT BATTERY REMAINING LONGEVITY: 99 mo
MDC IDC MSMT LEADCHNL RA PACING THRESHOLD AMPLITUDE: 0.75 V
MDC IDC MSMT LEADCHNL RV PACING THRESHOLD AMPLITUDE: 0.625 V
MDC IDC MSMT LEADCHNL RV SENSING INTR AMPL: 6.375 mV
MDC IDC SESS DTM: 20150928185653
MDC IDC SET LEADCHNL RV PACING AMPLITUDE: 2.5 V
MDC IDC SET LEADCHNL RV SENSING SENSITIVITY: 0.3 mV
MDC IDC SET ZONE DETECTION INTERVAL: 230 ms
MDC IDC SET ZONE DETECTION INTERVAL: 350 ms
MDC IDC SET ZONE DETECTION INTERVAL: 450 ms
MDC IDC STAT BRADY AP VP PERCENT: 1.33 %
MDC IDC STAT BRADY AS VP PERCENT: 0.25 %
MDC IDC STAT BRADY AS VS PERCENT: 3.45 %
MDC IDC STAT BRADY RV PERCENT PACED: 1.58 %
Zone Setting Detection Interval: 300 ms
Zone Setting Detection Interval: 400 ms

## 2014-09-25 NOTE — Progress Notes (Signed)
Remote ICD transmission.   

## 2014-09-25 NOTE — Telephone Encounter (Signed)
Spoke with pt and reminded pt of remote transmission that is due today. Pt verbalized understanding.   

## 2014-10-17 ENCOUNTER — Encounter: Payer: Self-pay | Admitting: Cardiology

## 2014-10-20 DIAGNOSIS — Z125 Encounter for screening for malignant neoplasm of prostate: Secondary | ICD-10-CM | POA: Diagnosis not present

## 2014-10-20 DIAGNOSIS — Z008 Encounter for other general examination: Secondary | ICD-10-CM | POA: Diagnosis not present

## 2014-10-20 DIAGNOSIS — I10 Essential (primary) hypertension: Secondary | ICD-10-CM | POA: Diagnosis not present

## 2014-10-31 ENCOUNTER — Other Ambulatory Visit (INDEPENDENT_AMBULATORY_CARE_PROVIDER_SITE_OTHER): Payer: Medicare Other

## 2014-10-31 DIAGNOSIS — E785 Hyperlipidemia, unspecified: Secondary | ICD-10-CM | POA: Diagnosis not present

## 2014-10-31 LAB — BASIC METABOLIC PANEL
BUN: 14 mg/dL (ref 6–23)
CALCIUM: 8.8 mg/dL (ref 8.4–10.5)
CHLORIDE: 106 meq/L (ref 96–112)
CO2: 25 mEq/L (ref 19–32)
CREATININE: 1.3 mg/dL (ref 0.4–1.5)
GFR: 58.95 mL/min — ABNORMAL LOW (ref >60.00)
Glucose, Bld: 89 mg/dL (ref 70–99)
Potassium: 4.3 mEq/L (ref 3.5–5.1)
Sodium: 138 mEq/L (ref 135–145)

## 2014-10-31 LAB — LIPID PANEL
CHOLESTEROL: 133 mg/dL (ref 0–200)
HDL: 40.9 mg/dL (ref >39.00)
LDL Cholesterol: 81 mg/dL (ref 0–99)
NonHDL: 92.1
Total CHOL/HDL Ratio: 3
Triglycerides: 55 mg/dL (ref 0.0–149.0)
VLDL: 11 mg/dL (ref 0.0–40.0)

## 2014-10-31 LAB — HEPATIC FUNCTION PANEL
ALT: 25 U/L (ref 0–53)
AST: 37 U/L (ref 0–37)
Albumin: 3.2 g/dL — ABNORMAL LOW (ref 3.5–5.2)
Alkaline Phosphatase: 33 U/L — ABNORMAL LOW (ref 39–117)
BILIRUBIN TOTAL: 0.8 mg/dL (ref 0.2–1.2)
Bilirubin, Direct: 0.1 mg/dL (ref 0.0–0.3)
TOTAL PROTEIN: 6.3 g/dL (ref 6.0–8.3)

## 2014-11-01 ENCOUNTER — Encounter: Payer: Self-pay | Admitting: Cardiology

## 2014-11-02 DIAGNOSIS — E785 Hyperlipidemia, unspecified: Secondary | ICD-10-CM | POA: Diagnosis not present

## 2014-11-02 DIAGNOSIS — Z23 Encounter for immunization: Secondary | ICD-10-CM | POA: Diagnosis not present

## 2014-11-02 DIAGNOSIS — N401 Enlarged prostate with lower urinary tract symptoms: Secondary | ICD-10-CM | POA: Diagnosis not present

## 2014-11-02 DIAGNOSIS — I2510 Atherosclerotic heart disease of native coronary artery without angina pectoris: Secondary | ICD-10-CM | POA: Diagnosis not present

## 2014-11-02 DIAGNOSIS — Z1212 Encounter for screening for malignant neoplasm of rectum: Secondary | ICD-10-CM | POA: Diagnosis not present

## 2014-11-02 DIAGNOSIS — Z950 Presence of cardiac pacemaker: Secondary | ICD-10-CM | POA: Diagnosis not present

## 2014-11-02 DIAGNOSIS — I10 Essential (primary) hypertension: Secondary | ICD-10-CM | POA: Diagnosis not present

## 2014-11-02 DIAGNOSIS — Z0000 Encounter for general adult medical examination without abnormal findings: Secondary | ICD-10-CM | POA: Diagnosis not present

## 2014-11-02 DIAGNOSIS — M1990 Unspecified osteoarthritis, unspecified site: Secondary | ICD-10-CM | POA: Diagnosis not present

## 2014-12-07 ENCOUNTER — Encounter (HOSPITAL_COMMUNITY): Payer: Self-pay | Admitting: Internal Medicine

## 2014-12-19 ENCOUNTER — Other Ambulatory Visit: Payer: Self-pay | Admitting: Cardiovascular Disease

## 2014-12-26 DIAGNOSIS — I5022 Chronic systolic (congestive) heart failure: Secondary | ICD-10-CM | POA: Diagnosis not present

## 2014-12-26 DIAGNOSIS — I255 Ischemic cardiomyopathy: Secondary | ICD-10-CM

## 2014-12-27 ENCOUNTER — Ambulatory Visit (INDEPENDENT_AMBULATORY_CARE_PROVIDER_SITE_OTHER): Payer: Medicare Other | Admitting: *Deleted

## 2014-12-27 DIAGNOSIS — I255 Ischemic cardiomyopathy: Secondary | ICD-10-CM

## 2014-12-27 DIAGNOSIS — I5022 Chronic systolic (congestive) heart failure: Secondary | ICD-10-CM

## 2014-12-27 NOTE — Progress Notes (Signed)
Remote ICD transmission.   

## 2014-12-28 LAB — MDC_IDC_ENUM_SESS_TYPE_REMOTE
Battery Remaining Longevity: 96 mo
Brady Statistic AP VP Percent: 1.18 %
Brady Statistic AS VP Percent: 0.22 %
Brady Statistic AS VS Percent: 3.72 %
Brady Statistic RA Percent Paced: 96.06 %
Brady Statistic RV Percent Paced: 1.41 %
Date Time Interrogation Session: 20151229185906
HIGH POWER IMPEDANCE MEASURED VALUE: 39 Ohm
HIGH POWER IMPEDANCE MEASURED VALUE: 56 Ohm
Lead Channel Impedance Value: 437 Ohm
Lead Channel Impedance Value: 513 Ohm
Lead Channel Pacing Threshold Amplitude: 0.625 V
Lead Channel Pacing Threshold Pulse Width: 0.4 ms
Lead Channel Pacing Threshold Pulse Width: 0.4 ms
Lead Channel Sensing Intrinsic Amplitude: 1 mV
Lead Channel Sensing Intrinsic Amplitude: 10.125 mV
Lead Channel Setting Pacing Amplitude: 2 V
Lead Channel Setting Pacing Pulse Width: 0.4 ms
MDC IDC MSMT BATTERY VOLTAGE: 3.01 V
MDC IDC MSMT LEADCHNL RA IMPEDANCE VALUE: 380 Ohm
MDC IDC MSMT LEADCHNL RA PACING THRESHOLD AMPLITUDE: 0.625 V
MDC IDC SET LEADCHNL RV PACING AMPLITUDE: 2.5 V
MDC IDC SET LEADCHNL RV SENSING SENSITIVITY: 0.3 mV
MDC IDC SET ZONE DETECTION INTERVAL: 350 ms
MDC IDC SET ZONE DETECTION INTERVAL: 450 ms
MDC IDC STAT BRADY AP VS PERCENT: 94.87 %
Zone Setting Detection Interval: 230 ms
Zone Setting Detection Interval: 300 ms
Zone Setting Detection Interval: 400 ms

## 2015-01-11 ENCOUNTER — Other Ambulatory Visit: Payer: Self-pay | Admitting: Cardiovascular Disease

## 2015-01-12 ENCOUNTER — Encounter: Payer: Self-pay | Admitting: Cardiology

## 2015-01-17 ENCOUNTER — Encounter: Payer: Self-pay | Admitting: Internal Medicine

## 2015-01-22 ENCOUNTER — Encounter: Payer: Self-pay | Admitting: Cardiovascular Disease

## 2015-01-22 ENCOUNTER — Ambulatory Visit (INDEPENDENT_AMBULATORY_CARE_PROVIDER_SITE_OTHER): Payer: Medicare Other | Admitting: Cardiovascular Disease

## 2015-01-22 VITALS — BP 114/66 | HR 69 | Ht 71.0 in | Wt 174.0 lb

## 2015-01-22 DIAGNOSIS — I5022 Chronic systolic (congestive) heart failure: Secondary | ICD-10-CM

## 2015-01-22 DIAGNOSIS — E785 Hyperlipidemia, unspecified: Secondary | ICD-10-CM

## 2015-01-22 DIAGNOSIS — I472 Ventricular tachycardia, unspecified: Secondary | ICD-10-CM
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
## 2015-01-22 NOTE — Patient Instructions (Signed)
Your physician recommends that you continue on your current medications as directed. Please refer to the Current Medication list given to you today.  Your physician recommends that you return for lab work in: in 6 months at your next appointment with Dr Acie Fredrickson  Your physician wants you to follow-up in: 6 months. You will receive a reminder letter in the mail two months in advance. If you don't receive a letter, please call our office to schedule the follow-up appointment.

## 2015-02-07 DIAGNOSIS — H2513 Age-related nuclear cataract, bilateral: Secondary | ICD-10-CM | POA: Diagnosis not present

## 2015-03-05 ENCOUNTER — Other Ambulatory Visit: Payer: Self-pay | Admitting: Cardiovascular Disease

## 2015-03-08 DIAGNOSIS — L570 Actinic keratosis: Secondary | ICD-10-CM | POA: Diagnosis not present

## 2015-03-08 DIAGNOSIS — L814 Other melanin hyperpigmentation: Secondary | ICD-10-CM | POA: Diagnosis not present

## 2015-03-08 DIAGNOSIS — L821 Other seborrheic keratosis: Secondary | ICD-10-CM | POA: Diagnosis not present

## 2015-03-08 DIAGNOSIS — D225 Melanocytic nevi of trunk: Secondary | ICD-10-CM | POA: Diagnosis not present

## 2015-03-30 ENCOUNTER — Other Ambulatory Visit: Payer: Self-pay | Admitting: Cardiovascular Disease

## 2015-04-03 ENCOUNTER — Encounter: Payer: Self-pay | Admitting: Internal Medicine

## 2015-04-03 ENCOUNTER — Ambulatory Visit (INDEPENDENT_AMBULATORY_CARE_PROVIDER_SITE_OTHER): Payer: Medicare Other | Admitting: Internal Medicine

## 2015-04-03 VITALS — BP 118/56 | HR 62 | Ht 71.0 in | Wt 170.6 lb

## 2015-04-03 DIAGNOSIS — I4901 Ventricular fibrillation: Secondary | ICD-10-CM

## 2015-04-03 DIAGNOSIS — I5022 Chronic systolic (congestive) heart failure: Secondary | ICD-10-CM

## 2015-04-03 DIAGNOSIS — I472 Ventricular tachycardia, unspecified: Secondary | ICD-10-CM

## 2015-04-03 DIAGNOSIS — Z4502 Encounter for adjustment and management of automatic implantable cardiac defibrillator: Secondary | ICD-10-CM | POA: Diagnosis not present

## 2015-04-03 DIAGNOSIS — I255 Ischemic cardiomyopathy: Secondary | ICD-10-CM | POA: Diagnosis not present

## 2015-04-03 LAB — MDC_IDC_ENUM_SESS_TYPE_INCLINIC
Battery Remaining Longevity: 91 mo
Battery Voltage: 3.01 V
Brady Statistic AP VP Percent: 1.36 %
Brady Statistic AS VP Percent: 0.24 %
Brady Statistic AS VS Percent: 3.55 %
Brady Statistic RV Percent Paced: 1.6 %
HIGH POWER IMPEDANCE MEASURED VALUE: 35 Ohm
HIGH POWER IMPEDANCE MEASURED VALUE: 52 Ohm
HighPow Impedance: 152 Ohm
Lead Channel Impedance Value: 456 Ohm
Lead Channel Pacing Threshold Amplitude: 0.625 V
Lead Channel Pacing Threshold Amplitude: 0.75 V
Lead Channel Pacing Threshold Pulse Width: 0.4 ms
Lead Channel Sensing Intrinsic Amplitude: 10.125 mV
Lead Channel Setting Pacing Amplitude: 2 V
Lead Channel Setting Pacing Pulse Width: 0.4 ms
Lead Channel Setting Sensing Sensitivity: 0.3 mV
MDC IDC MSMT LEADCHNL RA IMPEDANCE VALUE: 342 Ohm
MDC IDC MSMT LEADCHNL RA PACING THRESHOLD PULSEWIDTH: 0.4 ms
MDC IDC MSMT LEADCHNL RA SENSING INTR AMPL: 1.625 mV
MDC IDC SESS DTM: 20160405150247
MDC IDC SET LEADCHNL RV PACING AMPLITUDE: 2.5 V
MDC IDC SET ZONE DETECTION INTERVAL: 230 ms
MDC IDC SET ZONE DETECTION INTERVAL: 300 ms
MDC IDC STAT BRADY AP VS PERCENT: 94.85 %
MDC IDC STAT BRADY RA PERCENT PACED: 96.21 %
Zone Setting Detection Interval: 350 ms
Zone Setting Detection Interval: 400 ms
Zone Setting Detection Interval: 450 ms

## 2015-04-03 NOTE — Progress Notes (Signed)
Patient Care Team: Burnard Bunting, MD as PCP - General (Internal Medicine)   HPI  [name] is a 80 y.o. male Seen in followup for VT in the setting of ischemic cardiomyopathy with prior myocardial infarction, prior bypass surgery in 1995 He has histtory of ventricular tachycardia storm And he takes amiodarone high doses of which were poorly tolerated because of nausea and jitteriness He has a history of reported cardiac arrest and is status post ICD implantation.   He has  recent catheterization (January 2012) whiich demonstrated patent vein grafts with an atretic LIMA to his ramus. Ejection fraction was 15-20%.   He has had intercurrent flulike illness for which he is recovering somewhat.  Drug surveillance laboratory work drawn in [place]. His AST remains mildly elevated. Thyroid functions and potassium levels were normal   Past Medical History  Diagnosis Date  . Myocardial infarction     1995, non-STEMI January 2012  . Ischemic cardiomyopathy     status post CABG with patent vein grafts and an atretic LIMA to his ramus, January 2012  . Ventricular tachycardia      VT Storm-January 2012- responded to Amiodarone  . Hypertension   . Ventricular fibrillation   . Chronic systolic congestive heart failure   . Hyperlipidemia   . Encounter for long-term (current) use of other medications     amiodarone  . Atrial arrhythmia/a fibrillation     not on coumadin 2/2 subdural hematoma  . Subdural hematoma     on coumadin  . Automatic implantable cardiac defibrillator in situ     medtronic  . CHF (congestive heart failure)   . Thyroid disease     hypothyroid  . Automatic implantable cardioverter-defibrillator in situ   . Pacemaker   . Hypothyroidism   . H/O hiatal hernia   . Dysrhythmia     Past Surgical History  Procedure Laterality Date  . Bilateral cranitomies for sub hematomas    . Bypass graft  1995    5  . Coronary artery bypass graft  1995    5  . Insert / replace / remove pacemaker  14     generator repl   . Tonsillectomy    . Inguinal hernia repair Left 05/15/2014    Procedure: HERNIA REPAIR INGUINAL ADULT;  Surgeon: Zenovia Jarred, MD;  Location: [facility];  Service: General;  Laterality: Left;  . Insertion of mesh Left 05/15/2014    Procedure: INSERTION OF MESH;  Surgeon: Zenovia Jarred, MD;  Location: [facility];  Service: General;  Laterality: Left;  . Implantable cardioverter defibrillator generator change N/A 03/28/2013    Procedure: IMPLANTABLE CARDIOVERTER DEFIBRILLATOR GENERATOR CHANGE;  Surgeon: Deboraha Sprang, MD;  Location: [facility] CATH LAB;  Service: Cardiovascular;  Laterality: N/A;    Current Outpatient Prescriptions  Medication Sig Dispense Refill  . amiodarone (PACERONE) 200 MG tablet TAKE 1 TABLET BY MOUTH 5 DAYS A WEEK (MONDAY, WEDNESDAY, THURSDAY, SATURDAY,& SUNDAY (Patient taking differently: TAKE 1/2 (100 mg) TABLET BY MOUTH 5 DAYS A WEEK (MONDAY, WEDNESDAY, THURSDAY, SATURDAY,& SUNDAY) 30 tablet 3  . aspirin 81 MG tablet Take 81 mg by mouth daily.      . carvedilol (COREG) 6.25 MG tablet Take 1 tablet (6.25 mg total) by mouth 2 (two) times daily with a meal. 180 tablet 3  . fenofibrate 160 MG tablet Take 1 tablet (160 mg total) by mouth daily. 30 tablet 11  . levothyroxine (SYNTHROID, LEVOTHROID) 50 MCG tablet Take  50 mcg by mouth daily.      [name] Kitchen lisinopril (PRINIVIL,ZESTRIL) 5 MG tablet TAKE 1 TABLET BY MOUTH DAILY. 30 tablet 3  . niacin (SLO-NIACIN) 500 MG tablet Take 1,000-2,000 mg by mouth daily. Alternates with 1000 mg one day and 2000 mg the next    . nitroGLYCERIN (NITROSTAT) 0.4 MG SL tablet Place 1 tablet (0.4 mg total) under the tongue every 5 (five) minutes as needed for chest pain. For chest pain x 3 doses 25 tablet 3  . spironolactone (ALDACTONE) 25 MG tablet TAKE 1 TABLET BY MOUTH ONCE A DAY . 90 tablet 1  . Tamsulosin HCl (FLOMAX) 0.4 MG CAPS Take 1 tablet by mouth daily.     No current  facility-administered medications for this visit.    No Known Allergies  Review of Systems negative except from HPI and PMH  Physical Exam BP 118/56 mmHg  Pulse 62  Ht 5\' 11"  (1.803 m)  Wt 170 lb 9.6 oz (77.384 kg)  BMI 23.80 kg/m2 Well developed and well nourished in no acute distress HENT normal E scleral and icterus clear Neck Supple JVP flat; carotids brisk and full Clear to ausculation  Regular rate and rhythm, no murmurs gallops or rub Soft with active bowel sounds No clubbing cyanosis  Edema Alert and oriented, grossly normal motor and sensory function Skin Warm and Dry  Optivol screen was normal  Assessment and  Plan  Ischemic cardiomyopathy  Congestive heart failure-chronic-systolic  Ventricular tachycardia  High risk medication surveillance  Dyslipidemia. There is a cell phone and left a Chana Bode is seen at because he also takes Aldactone  Atrial noise being classified by the ICD as atrial fibrillation  Defibrillator-Medtronic  The patient's device was interrogated.  The information was reviewed. No changes were made in the programming.    [name] is euvolemic. His rhythms are quiet at this time. We will decrease his amiodarone dose from 200 mg 5 days a week--100 mg a day. Surveillance laboratories are ordered Patient denies symptoms of GI intolerance, sun sensitivity, neurological symptoms attributable to amiodarone.   We reviewed the electrical signals associated with the atrial oversensing. They're relatively infrequent at 0.2%. No clearly associated symptoms. We will follow  No intercurrent Ventricular tachycardia  Without symptoms of ischemia  Also need monitoring of potassium for his Aldactone.   We'll see her in one years time although i may get a chance to go fishing before then

## 2015-04-03 NOTE — Patient Instructions (Signed)
Your physician recommends that you continue on your current medications as directed. Please refer to the Current Medication list given to you today.  Your physician wants you to follow-up in: 12  Months with Dr. Gari Crown will receive a reminder letter in the mail two months in advance. If you don't receive a letter, please call our office to schedule the follow-up appointment.

## 2015-04-05 ENCOUNTER — Other Ambulatory Visit: Payer: Self-pay | Admitting: *Deleted

## 2015-04-05 ENCOUNTER — Telehealth: Payer: Self-pay | Admitting: *Deleted

## 2015-04-05 DIAGNOSIS — Z79899 Other long term (current) drug therapy: Secondary | ICD-10-CM

## 2015-04-05 NOTE — Telephone Encounter (Signed)
called to schedule blood work for medication management. coming in 04/06/15 for TSH & CMET, per Dr. Caryl Comes.

## 2015-04-06 ENCOUNTER — Other Ambulatory Visit (INDEPENDENT_AMBULATORY_CARE_PROVIDER_SITE_OTHER): Payer: Medicare Other | Admitting: *Deleted

## 2015-04-06 DIAGNOSIS — Z79899 Other long term (current) drug therapy: Secondary | ICD-10-CM

## 2015-04-06 LAB — COMPREHENSIVE METABOLIC PANEL
ALT: 19 U/L (ref 0–53)
AST: 27 U/L (ref 0–37)
Albumin: 3.5 g/dL (ref 3.5–5.2)
Alkaline Phosphatase: 33 U/L — ABNORMAL LOW (ref 39–117)
BILIRUBIN TOTAL: 0.6 mg/dL (ref 0.2–1.2)
BUN: 15 mg/dL (ref 6–23)
CHLORIDE: 107 meq/L (ref 96–112)
CO2: 26 mEq/L (ref 19–32)
CREATININE: 1.17 mg/dL (ref 0.40–1.50)
Calcium: 8.8 mg/dL (ref 8.4–10.5)
GFR: 64.14 mL/min (ref >60.00)
GLUCOSE: 80 mg/dL (ref 70–99)
Potassium: 4 mEq/L (ref 3.5–5.1)
Sodium: 138 mEq/L (ref 135–145)
Total Protein: 6.1 g/dL (ref 6.0–8.3)

## 2015-04-06 LAB — TSH: TSH: 4.12 u[IU]/mL (ref 0.35–4.50)

## 2015-04-09 ENCOUNTER — Encounter: Payer: Self-pay | Admitting: Internal Medicine

## 2015-04-13 ENCOUNTER — Telehealth: Payer: Self-pay | Admitting: Internal Medicine

## 2015-04-13 NOTE — Telephone Encounter (Signed)
New problem   Pt stated he received a call today and stated for him to call back to speak to [place] or Sherri. Advised pt that neither one of them was here today. Pt don't know why he received a call.

## 2015-04-13 NOTE — Telephone Encounter (Signed)
Patient advised that after review of chart, no messages were left on yesterday. Patient advised that his lab work hasn't been reviewed yet by his doctor and once it is reviewed that someone would be contacting him in regards to his results. Patient verbalized understanding.

## 2015-05-14 ENCOUNTER — Telehealth: Payer: Self-pay | Admitting: Internal Medicine

## 2015-05-14 NOTE — Telephone Encounter (Signed)
New problem   Pt stated he received something from [employer] and it was a wirex and want to speak to someone concerning this.

## 2015-05-14 NOTE — Telephone Encounter (Signed)
Explained to pt how to use wirex and what it is for. Pt verbalized understanding.

## 2015-06-25 ENCOUNTER — Other Ambulatory Visit: Payer: Self-pay | Admitting: Cardiovascular Disease

## 2015-06-25 ENCOUNTER — Other Ambulatory Visit: Payer: Self-pay | Admitting: Internal Medicine

## 2015-07-03 ENCOUNTER — Ambulatory Visit (INDEPENDENT_AMBULATORY_CARE_PROVIDER_SITE_OTHER): Payer: Medicare Other | Admitting: *Deleted

## 2015-07-03 DIAGNOSIS — I255 Ischemic cardiomyopathy: Secondary | ICD-10-CM | POA: Diagnosis not present

## 2015-07-03 DIAGNOSIS — I5022 Chronic systolic (congestive) heart failure: Secondary | ICD-10-CM | POA: Diagnosis not present

## 2015-07-04 NOTE — Progress Notes (Signed)
Remote ICD transmission.   

## 2015-07-07 LAB — CUP PACEART REMOTE DEVICE CHECK
Battery Remaining Longevity: 88 mo
Brady Statistic AP VP Percent: 1.48 %
Brady Statistic AP VS Percent: 92.77 %
Brady Statistic AS VS Percent: 5.39 %
Brady Statistic RV Percent Paced: 1.84 %
Date Time Interrogation Session: 20160705052307
HIGH POWER IMPEDANCE MEASURED VALUE: 36 Ohm
HIGH POWER IMPEDANCE MEASURED VALUE: 52 Ohm
Lead Channel Impedance Value: 380 Ohm
Lead Channel Impedance Value: 437 Ohm
Lead Channel Pacing Threshold Amplitude: 0.625 V
Lead Channel Pacing Threshold Amplitude: 0.625 V
Lead Channel Pacing Threshold Pulse Width: 0.4 ms
Lead Channel Sensing Intrinsic Amplitude: 0.875 mV
Lead Channel Sensing Intrinsic Amplitude: 10.5 mV
Lead Channel Setting Pacing Amplitude: 2 V
Lead Channel Setting Pacing Amplitude: 2.5 V
Lead Channel Setting Pacing Pulse Width: 0.4 ms
Lead Channel Setting Sensing Sensitivity: 0.3 mV
MDC IDC MSMT BATTERY VOLTAGE: 3.01 V
MDC IDC MSMT LEADCHNL RA IMPEDANCE VALUE: 323 Ohm
MDC IDC MSMT LEADCHNL RA PACING THRESHOLD PULSEWIDTH: 0.4 ms
MDC IDC MSMT LEADCHNL RA SENSING INTR AMPL: 0.875 mV
MDC IDC MSMT LEADCHNL RV SENSING INTR AMPL: 10.5 mV
MDC IDC SET ZONE DETECTION INTERVAL: 230 ms
MDC IDC SET ZONE DETECTION INTERVAL: 350 ms
MDC IDC SET ZONE DETECTION INTERVAL: 450 ms
MDC IDC STAT BRADY AS VP PERCENT: 0.36 %
MDC IDC STAT BRADY RA PERCENT PACED: 94.26 %
Zone Setting Detection Interval: 300 ms
Zone Setting Detection Interval: 400 ms

## 2015-07-12 ENCOUNTER — Other Ambulatory Visit: Payer: Self-pay | Admitting: Cardiovascular Disease

## 2015-08-03 ENCOUNTER — Encounter: Payer: Self-pay | Admitting: Cardiology

## 2015-08-10 ENCOUNTER — Encounter: Payer: Self-pay | Admitting: Internal Medicine

## 2015-08-21 ENCOUNTER — Encounter: Payer: Self-pay | Admitting: Cardiology

## 2015-10-04 ENCOUNTER — Encounter: Payer: Self-pay | Admitting: Internal Medicine

## 2015-10-04 ENCOUNTER — Ambulatory Visit (INDEPENDENT_AMBULATORY_CARE_PROVIDER_SITE_OTHER): Payer: Medicare Other | Admitting: *Deleted

## 2015-10-04 DIAGNOSIS — I5022 Chronic systolic (congestive) heart failure: Secondary | ICD-10-CM | POA: Diagnosis not present

## 2015-10-04 DIAGNOSIS — I255 Ischemic cardiomyopathy: Secondary | ICD-10-CM | POA: Diagnosis not present

## 2015-10-05 NOTE — Progress Notes (Signed)
Remote ICD transmission.   

## 2015-10-22 LAB — CUP PACEART REMOTE DEVICE CHECK
Battery Voltage: 3.01 V
Brady Statistic AP VS Percent: 93.02 %
Brady Statistic AS VP Percent: 0.31 %
Brady Statistic AS VS Percent: 5.26 %
Date Time Interrogation Session: 20161006073430
HighPow Impedance: 38 Ohm
HighPow Impedance: 52 Ohm
Implantable Lead Location: 753859
Implantable Lead Model: 6940
Lead Channel Impedance Value: 456 Ohm
Lead Channel Pacing Threshold Amplitude: 0.625 V
Lead Channel Pacing Threshold Amplitude: 0.75 V
Lead Channel Sensing Intrinsic Amplitude: 1.125 mV
Lead Channel Sensing Intrinsic Amplitude: 1.125 mV
Lead Channel Setting Pacing Amplitude: 2.5 V
Lead Channel Setting Sensing Sensitivity: 0.3 mV
MDC IDC LEAD IMPLANT DT: 19990520
MDC IDC LEAD IMPLANT DT: 20011228
MDC IDC LEAD LOCATION: 753860
MDC IDC MSMT BATTERY REMAINING LONGEVITY: 86 mo
MDC IDC MSMT LEADCHNL RA IMPEDANCE VALUE: 342 Ohm
MDC IDC MSMT LEADCHNL RA PACING THRESHOLD PULSEWIDTH: 0.4 ms
MDC IDC MSMT LEADCHNL RV IMPEDANCE VALUE: 399 Ohm
MDC IDC MSMT LEADCHNL RV PACING THRESHOLD PULSEWIDTH: 0.4 ms
MDC IDC MSMT LEADCHNL RV SENSING INTR AMPL: 9.75 mV
MDC IDC MSMT LEADCHNL RV SENSING INTR AMPL: 9.75 mV
MDC IDC SET LEADCHNL RA PACING AMPLITUDE: 2 V
MDC IDC SET LEADCHNL RV PACING PULSEWIDTH: 0.4 ms
MDC IDC STAT BRADY AP VP PERCENT: 1.41 %
MDC IDC STAT BRADY RA PERCENT PACED: 94.43 %
MDC IDC STAT BRADY RV PERCENT PACED: 1.72 %

## 2015-10-29 DIAGNOSIS — I10 Essential (primary) hypertension: Secondary | ICD-10-CM | POA: Diagnosis not present

## 2015-10-29 DIAGNOSIS — E785 Hyperlipidemia, unspecified: Secondary | ICD-10-CM | POA: Diagnosis not present

## 2015-10-29 DIAGNOSIS — Z125 Encounter for screening for malignant neoplasm of prostate: Secondary | ICD-10-CM | POA: Diagnosis not present

## 2015-11-05 DIAGNOSIS — M1990 Unspecified osteoarthritis, unspecified site: Secondary | ICD-10-CM | POA: Diagnosis not present

## 2015-11-05 DIAGNOSIS — I10 Essential (primary) hypertension: Secondary | ICD-10-CM | POA: Diagnosis not present

## 2015-11-05 DIAGNOSIS — Z23 Encounter for immunization: Secondary | ICD-10-CM | POA: Diagnosis not present

## 2015-11-05 DIAGNOSIS — Z1389 Encounter for screening for other disorder: Secondary | ICD-10-CM | POA: Diagnosis not present

## 2015-11-05 DIAGNOSIS — Z950 Presence of cardiac pacemaker: Secondary | ICD-10-CM | POA: Diagnosis not present

## 2015-11-05 DIAGNOSIS — E784 Other hyperlipidemia: Secondary | ICD-10-CM | POA: Diagnosis not present

## 2015-11-05 DIAGNOSIS — N401 Enlarged prostate with lower urinary tract symptoms: Secondary | ICD-10-CM | POA: Diagnosis not present

## 2015-11-05 DIAGNOSIS — Z0000 Encounter for general adult medical examination without abnormal findings: Secondary | ICD-10-CM | POA: Diagnosis not present

## 2015-11-05 DIAGNOSIS — I2510 Atherosclerotic heart disease of native coronary artery without angina pectoris: Secondary | ICD-10-CM | POA: Diagnosis not present

## 2015-11-19 ENCOUNTER — Encounter: Payer: Self-pay | Admitting: Internal Medicine

## 2015-11-20 DIAGNOSIS — L821 Other seborrheic keratosis: Secondary | ICD-10-CM | POA: Diagnosis not present

## 2015-11-21 ENCOUNTER — Encounter: Payer: Self-pay | Admitting: *Deleted

## 2015-11-27 ENCOUNTER — Other Ambulatory Visit: Payer: Self-pay | Admitting: Cardiovascular Disease

## 2015-11-29 ENCOUNTER — Other Ambulatory Visit: Payer: Self-pay

## 2015-11-29 MED ORDER — AMIODARONE HCL 100 MG PO TABS: 100.0000 mg | ORAL_TABLET | Freq: Every day | ORAL | Status: DC

## 2015-12-12 DIAGNOSIS — M722 Plantar fascial fibromatosis: Secondary | ICD-10-CM | POA: Diagnosis not present

## 2015-12-12 DIAGNOSIS — M79672 Pain in left foot: Secondary | ICD-10-CM | POA: Diagnosis not present

## 2015-12-12 DIAGNOSIS — M7732 Calcaneal spur, left foot: Secondary | ICD-10-CM | POA: Diagnosis not present

## 2015-12-19 DIAGNOSIS — M722 Plantar fascial fibromatosis: Secondary | ICD-10-CM | POA: Diagnosis not present

## 2015-12-28 DIAGNOSIS — M71572 Other bursitis, not elsewhere classified, left ankle and foot: Secondary | ICD-10-CM | POA: Diagnosis not present

## 2015-12-28 DIAGNOSIS — M722 Plantar fascial fibromatosis: Secondary | ICD-10-CM | POA: Diagnosis not present

## 2016-01-03 ENCOUNTER — Ambulatory Visit (INDEPENDENT_AMBULATORY_CARE_PROVIDER_SITE_OTHER): Payer: Medicare Other | Admitting: *Deleted

## 2016-01-03 DIAGNOSIS — I255 Ischemic cardiomyopathy: Secondary | ICD-10-CM | POA: Diagnosis not present

## 2016-01-04 DIAGNOSIS — M722 Plantar fascial fibromatosis: Secondary | ICD-10-CM | POA: Diagnosis not present

## 2016-01-04 DIAGNOSIS — M7752 Other enthesopathy of left foot: Secondary | ICD-10-CM | POA: Diagnosis not present

## 2016-01-04 NOTE — Progress Notes (Signed)
Remote ICD transmission.   

## 2016-01-17 LAB — CUP PACEART REMOTE DEVICE CHECK
Battery Remaining Longevity: 85 mo
Battery Voltage: 3.01 V
Brady Statistic AP VP Percent: 0.58 %
Brady Statistic RV Percent Paced: 0.67 %
HighPow Impedance: 38 Ohm
HighPow Impedance: 56 Ohm
Implantable Lead Implant Date: 19990520
Implantable Lead Implant Date: 20011228
Implantable Lead Location: 753859
Implantable Lead Location: 753860
Implantable Lead Model: 6940
Implantable Lead Model: 6947
Lead Channel Impedance Value: 380 Ohm
Lead Channel Impedance Value: 456 Ohm
Lead Channel Pacing Threshold Amplitude: 0.625 V
Lead Channel Pacing Threshold Amplitude: 0.625 V
Lead Channel Pacing Threshold Pulse Width: 0.4 ms
Lead Channel Sensing Intrinsic Amplitude: 0.875 mV
Lead Channel Sensing Intrinsic Amplitude: 0.875 mV
Lead Channel Sensing Intrinsic Amplitude: 10.75 mV
Lead Channel Sensing Intrinsic Amplitude: 10.75 mV
Lead Channel Setting Pacing Amplitude: 2 V
Lead Channel Setting Pacing Pulse Width: 0.4 ms
MDC IDC MSMT LEADCHNL RV IMPEDANCE VALUE: 380 Ohm
MDC IDC MSMT LEADCHNL RV PACING THRESHOLD PULSEWIDTH: 0.4 ms
MDC IDC SESS DTM: 20170105184213
MDC IDC SET LEADCHNL RV PACING AMPLITUDE: 2.5 V
MDC IDC SET LEADCHNL RV SENSING SENSITIVITY: 0.3 mV
MDC IDC STAT BRADY AP VS PERCENT: 98.34 %
MDC IDC STAT BRADY AS VP PERCENT: 0.09 %
MDC IDC STAT BRADY AS VS PERCENT: 0.99 %
MDC IDC STAT BRADY RA PERCENT PACED: 98.92 %

## 2016-01-18 ENCOUNTER — Encounter: Payer: Self-pay | Admitting: Cardiology

## 2016-01-18 DIAGNOSIS — M722 Plantar fascial fibromatosis: Secondary | ICD-10-CM | POA: Diagnosis not present

## 2016-01-23 ENCOUNTER — Ambulatory Visit (INDEPENDENT_AMBULATORY_CARE_PROVIDER_SITE_OTHER): Payer: Medicare Other | Admitting: Cardiovascular Disease

## 2016-01-23 ENCOUNTER — Encounter: Payer: Self-pay | Admitting: Cardiovascular Disease

## 2016-01-23 VITALS — BP 94/70 | HR 79 | Ht 71.0 in | Wt 167.8 lb

## 2016-01-23 DIAGNOSIS — I472 Ventricular tachycardia, unspecified: Secondary | ICD-10-CM

## 2016-01-23 DIAGNOSIS — I255 Ischemic cardiomyopathy: Secondary | ICD-10-CM

## 2016-01-23 DIAGNOSIS — I2510 Atherosclerotic heart disease of native coronary artery without angina pectoris: Secondary | ICD-10-CM

## 2016-01-23 DIAGNOSIS — I5022 Chronic systolic (congestive) heart failure: Secondary | ICD-10-CM | POA: Diagnosis not present
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
## 2016-01-23 NOTE — Patient Instructions (Signed)
Medication Instructions:  Your physician recommends that you continue on your current medications as directed. Please refer to the Current Medication list given to you today.   Labwork: None Ordered   Testing/Procedures: None Ordered   Follow-Up: Your physician wants you to follow-up in: 1 year with Dr. Nahser.  You will receive a reminder letter in the mail two months in advance. If you don't receive a letter, please call our office to schedule the follow-up appointment.   If you need a refill on your cardiac medications before your next appointment, please call your pharmacy.   Thank you for choosing [facility]! Minnah Llamas, RN [phone]    

## 2016-02-04 ENCOUNTER — Encounter: Payer: Self-pay | Admitting: Cardiology

## 2016-02-04 DIAGNOSIS — K648 Other hemorrhoids: Secondary | ICD-10-CM | POA: Diagnosis not present

## 2016-02-04 DIAGNOSIS — Z6824 Body mass index (BMI) 24.0-24.9, adult: Secondary | ICD-10-CM | POA: Diagnosis not present

## 2016-02-04 DIAGNOSIS — N401 Enlarged prostate with lower urinary tract symptoms: Secondary | ICD-10-CM | POA: Diagnosis not present

## 2016-02-04 DIAGNOSIS — R3911 Hesitancy of micturition: Secondary | ICD-10-CM | POA: Diagnosis not present

## 2016-02-05 ENCOUNTER — Telehealth: Payer: Self-pay | Admitting: Internal Medicine

## 2016-02-05 NOTE — Telephone Encounter (Signed)
Dr. Henrene Pastor this pt would like to switch to Dr. Carlean Purl. States his son sees Dr. Carlean Purl. Will you approve the switch?

## 2016-02-05 NOTE — Telephone Encounter (Signed)
Sure

## 2016-02-05 NOTE — Telephone Encounter (Signed)
Dr. Carlean Purl will you accept this pt? See note below.

## 2016-02-06 DIAGNOSIS — R351 Nocturia: Secondary | ICD-10-CM | POA: Diagnosis not present

## 2016-02-06 DIAGNOSIS — N400 Enlarged prostate without lower urinary tract symptoms: Secondary | ICD-10-CM | POA: Diagnosis not present

## 2016-02-06 DIAGNOSIS — Z0000 Encounter for general adult medical examination without abnormal findings: Secondary | ICD-10-CM | POA: Diagnosis not present

## 2016-02-06 DIAGNOSIS — R3912 Poor urinary stream: Secondary | ICD-10-CM | POA: Diagnosis not present

## 2016-02-06 DIAGNOSIS — R338 Other retention of urine: Secondary | ICD-10-CM | POA: Diagnosis not present

## 2016-02-08 NOTE — Telephone Encounter (Signed)
Please schedule pt to see Dr. Carlean Purl, see note below.

## 2016-02-08 NOTE — Telephone Encounter (Signed)
OK 

## 2016-02-11 ENCOUNTER — Other Ambulatory Visit: Payer: Self-pay | Admitting: Cardiovascular Disease

## 2016-02-12 DIAGNOSIS — K6289 Other specified diseases of anus and rectum: Secondary | ICD-10-CM | POA: Diagnosis not present

## 2016-02-13 DIAGNOSIS — R3912 Poor urinary stream: Secondary | ICD-10-CM | POA: Diagnosis not present

## 2016-02-13 DIAGNOSIS — Z0000 Encounter for general adult medical examination without abnormal findings: Secondary | ICD-10-CM | POA: Diagnosis not present

## 2016-02-13 DIAGNOSIS — R338 Other retention of urine: Secondary | ICD-10-CM | POA: Diagnosis not present

## 2016-03-06 ENCOUNTER — Other Ambulatory Visit: Payer: Self-pay | Admitting: Cardiovascular Disease

## 2016-04-14 ENCOUNTER — Encounter: Payer: Self-pay | Admitting: Internal Medicine

## 2016-04-14 ENCOUNTER — Ambulatory Visit (INDEPENDENT_AMBULATORY_CARE_PROVIDER_SITE_OTHER): Payer: Medicare Other | Admitting: Internal Medicine

## 2016-04-14 VITALS — BP 118/60 | HR 64 | Ht 71.0 in | Wt 166.6 lb

## 2016-04-14 DIAGNOSIS — Z95810 Presence of automatic (implantable) cardiac defibrillator: Secondary | ICD-10-CM | POA: Diagnosis not present

## 2016-04-14 DIAGNOSIS — I5022 Chronic systolic (congestive) heart failure: Secondary | ICD-10-CM

## 2016-04-14 DIAGNOSIS — I509 Heart failure, unspecified: Secondary | ICD-10-CM | POA: Diagnosis not present

## 2016-04-14 DIAGNOSIS — I2589 Other forms of chronic ischemic heart disease: Secondary | ICD-10-CM | POA: Diagnosis not present

## 2016-04-14 DIAGNOSIS — I472 Ventricular tachycardia, unspecified: Secondary | ICD-10-CM

## 2016-04-14 DIAGNOSIS — I255 Ischemic cardiomyopathy: Secondary | ICD-10-CM | POA: Diagnosis not present

## 2016-04-14 LAB — HEPATIC FUNCTION PANEL
ALK PHOS: 35 U/L — AB (ref 40–115)
ALT: 17 U/L (ref 9–46)
AST: 30 U/L (ref 10–35)
Albumin: 3.9 g/dL (ref 3.6–5.1)
BILIRUBIN DIRECT: 0.2 mg/dL (ref <=0.2)
Indirect Bilirubin: 0.5 mg/dL (ref 0.2–1.2)
Total Bilirubin: 0.7 mg/dL (ref 0.2–1.2)
Total Protein: 6.3 g/dL (ref 6.1–8.1)

## 2016-04-14 LAB — TSH: TSH: 3.44 m[IU]/L (ref 0.40–4.50)

## 2016-04-14 NOTE — Patient Instructions (Signed)
Medication Instructions: - Your physician recommends that you continue on your current medications as directed. Please refer to the Current Medication list given to you today.  Labwork: - Your physician recommends that you have lab work today: liver/ TSH  Procedures/Testing: - none  Follow-Up: - Remote monitoring is used to monitor your Pacemaker of ICD from home. This monitoring reduces the number of office visits required to check your device to one time per year. It allows [place] to keep an eye on the functioning of your device to ensure it is working properly. You are scheduled for a device check from home on 07/14/16. You may send your transmission at any time that day. If you have a wireless device, the transmission will be sent automatically. After your physician reviews your transmission, you will receive a postcard with your next transmission date.  - Your physician wants you to follow-up in: 1 year with Dr. Caryl Comes. You will receive a reminder letter in the mail two months in advance. If you don't receive a letter, please call our office to schedule the follow-up appointment.  Any Additional Special Instructions Will Be Listed Below (If Applicable).     If you need a refill on your cardiac medications before your next appointment, please call your pharmacy.

## 2016-04-14 NOTE — Progress Notes (Signed)
Patient Care Team: Burnard Bunting, MD as PCP - General (Internal Medicine)   HPI  [name] is a 81 y.o. male Seen in followup for VT in the setting of ischemic cardiomyopathy with prior myocardial infarction, prior bypass surgery in 1995 He has histtory of ventricular tachycardia storm And he takes amiodarone high doses of which were poorly tolerated because of nausea and jitteriness He has a history of reported cardiac arrest and is status post ICD implantation.   He has  recent catheterization (January 2012) whiich demonstrated patent vein grafts with an atretic LIMA to his ramus. Ejection fraction was 15-20%.   Drug surveillance laboratory work drawn in [place]. His AST remains mildly elevated. Thyroid functions and potassium levels were normal.  The patient denies chest pain, shortness of breath, nocturnal dyspnea, orthopnea or peripheral edema.  There have been no palpitations, lightheadedness or syncope.     Past Medical History  Diagnosis Date  . Myocardial infarction [facility])     1995, non-STEMI January 2012  . Ischemic cardiomyopathy     status post CABG with patent vein grafts and an atretic LIMA to his ramus, January 2012  . Ventricular tachycardia ([place])      VT Storm-January 2012- responded to Amiodarone  . Hypertension   . Ventricular fibrillation ([place])   . Chronic systolic congestive heart failure ([place])   . Hyperlipidemia   . Encounter for long-term (current) use of other medications     amiodarone  . Atrial arrhythmia/a fibrillation     not on coumadin 2/2 subdural hematoma  . Subdural hematoma ([facility])     on coumadin  . Automatic implantable cardiac defibrillator in situ     medtronic  . CHF (congestive heart failure) ([place])   . Thyroid disease     hypothyroid  . Automatic implantable cardioverter-defibrillator in situ   . Pacemaker   . Hypothyroidism   . H/O hiatal hernia   . Dysrhythmia     Past Surgical History  Procedure  Laterality Date  . Bilateral cranitomies for sub hematomas    . Bypass graft  1995    5  . Coronary artery bypass graft  1995    5  . Insert / replace / remove pacemaker  14     generator repl   . Tonsillectomy    . Inguinal hernia repair Left 05/15/2014    Procedure: HERNIA REPAIR INGUINAL ADULT;  Surgeon: Zenovia Jarred, MD;  Location: [place];  Service: General;  Laterality: Left;  . Insertion of mesh Left 05/15/2014    Procedure: INSERTION OF MESH;  Surgeon: Zenovia Jarred, MD;  Location: [place];  Service: General;  Laterality: Left;  . Implantable cardioverter defibrillator generator change N/A 03/28/2013    Procedure: IMPLANTABLE CARDIOVERTER DEFIBRILLATOR GENERATOR CHANGE;  Surgeon: Deboraha Sprang, MD;  Location: [facility] CATH LAB;  Service: Cardiovascular;  Laterality: N/A;    Current Outpatient Prescriptions  Medication Sig Dispense Refill  . amiodarone (PACERONE) 100 MG tablet Take 50 mg by mouth daily.    [name] Kitchen aspirin 81 MG tablet Take 81 mg by mouth daily.      . carvedilol (COREG) 6.25 MG tablet TAKE 1 TABLET BY MOUTH 2 TIMES DAILY WITH A MEAL. 180 tablet 2  . fenofibrate 160 MG tablet TAKE 1 TABLET BY MOUTH EVERY DAY. 30 tablet 6  . levothyroxine (SYNTHROID, LEVOTHROID) 50 MCG tablet Take 50 mcg by mouth daily.      [name] Kitchen lisinopril (PRINIVIL,ZESTRIL) 5  MG tablet TAKE ONE TABLET BY MOUTH DAILY 30 tablet 6  . niacin (SLO-NIACIN) 500 MG tablet Take 1,000-2,000 mg by mouth daily. Alternates with 1000 mg one day and 2000 mg the next    . nitroGLYCERIN (NITROSTAT) 0.4 MG SL tablet Place 1 tablet (0.4 mg total) under the tongue every 5 (five) minutes as needed for chest pain. For chest pain x 3 doses 25 tablet 3  . spironolactone (ALDACTONE) 25 MG tablet TAKE 1 TABLET BY MOUTH DAILY 90 tablet 3  . Tamsulosin HCl (FLOMAX) 0.4 MG CAPS Take 0.4 mg by mouth daily.      No current facility-administered medications for this visit.    No Known Allergies  Review of Systems negative except from  HPI and PMH  Physical Exam BP 118/60 mmHg  Pulse 64  Ht 5\' 11"  (1.803 m)  Wt 166 lb 9.6 oz (75.569 kg)  BMI 23.25 kg/m2 Well developed and well nourished in no acute distress HENT normal E scleral and icterus clear Neck Supple JVP flat; carotids brisk and full Clear to ausculation  Regular rate and rhythm, no murmurs gallops or rub Soft with active bowel sounds No clubbing cyanosis  Edema Alert and oriented, grossly normal motor and sensory function Skin Warm and Dry  Optivol screen was normal  Assessment and  Plan  Ischemic cardiomyopathy  Congestive heart failure-chronic-systolic  Ventricular tachycardia  High risk medication surveillance  Atrial noise being classified by the ICD as atrial fibrillation;  Reproduced by mechanical manipulation   Defibrillator-Medtronic  The patient's device was interrogated.  The information was reviewed. No changes were made in the programming.    [name] is euvolemic. His rhythms are quiet at this time. We will decrease his amiodarone dose from 200 mg 5 days a week--100 mg a day. Surveillance laboratories are ordered Patient denies symptoms of GI intolerance, sun sensitivity, neurological symptoms attributable to amiodarone.   We reviewed the electrical signals associated with the atrial oversensing relaed to mechanical manipulation  Without symptoms of ischemia  Lab work  4.0  2/17 obbtained from [employer]

## 2016-04-15 LAB — CUP PACEART INCLINIC DEVICE CHECK
Battery Remaining Longevity: 80 mo
Battery Voltage: 3.01 V
Brady Statistic AP VP Percent: 1.46 %
Brady Statistic RA Percent Paced: 93.96 %
Brady Statistic RV Percent Paced: 1.77 %
Date Time Interrogation Session: 20170417143014
HIGH POWER IMPEDANCE MEASURED VALUE: 38 Ohm
HIGH POWER IMPEDANCE MEASURED VALUE: 55 Ohm
Implantable Lead Location: 753859
Implantable Lead Model: 6940
Lead Channel Impedance Value: 437 Ohm
Lead Channel Pacing Threshold Amplitude: 0.625 V
Lead Channel Pacing Threshold Pulse Width: 0.4 ms
Lead Channel Sensing Intrinsic Amplitude: 10.25 mV
Lead Channel Setting Sensing Sensitivity: 0.3 mV
MDC IDC LEAD IMPLANT DT: 19990520
MDC IDC LEAD IMPLANT DT: 20011228
MDC IDC LEAD LOCATION: 753860
MDC IDC MSMT LEADCHNL RA IMPEDANCE VALUE: 342 Ohm
MDC IDC MSMT LEADCHNL RA SENSING INTR AMPL: 1 mV
MDC IDC MSMT LEADCHNL RV IMPEDANCE VALUE: 456 Ohm
MDC IDC MSMT LEADCHNL RV PACING THRESHOLD AMPLITUDE: 0.625 V
MDC IDC MSMT LEADCHNL RV PACING THRESHOLD PULSEWIDTH: 0.4 ms
MDC IDC SET LEADCHNL RA PACING AMPLITUDE: 2 V
MDC IDC SET LEADCHNL RV PACING AMPLITUDE: 2.5 V
MDC IDC SET LEADCHNL RV PACING PULSEWIDTH: 0.4 ms
MDC IDC STAT BRADY AP VS PERCENT: 92.51 %
MDC IDC STAT BRADY AS VP PERCENT: 0.32 %
MDC IDC STAT BRADY AS VS PERCENT: 5.72 %

## 2016-04-17 ENCOUNTER — Telehealth: Payer: Self-pay | Admitting: Internal Medicine

## 2016-04-17 NOTE — Telephone Encounter (Signed)
Pt had a note on his calender to call our office today-he can't remember why-do you know? pls call

## 2016-04-17 NOTE — Telephone Encounter (Signed)
I left a message for the patient to call. 

## 2016-05-19 ENCOUNTER — Encounter (HOSPITAL_COMMUNITY): Payer: Self-pay | Admitting: Emergency Medicine

## 2016-05-19 ENCOUNTER — Emergency Department (HOSPITAL_COMMUNITY)
Admission: EM | Admit: 2016-05-19 | Discharge: 2016-05-19 | Disposition: A | Discharge status: Home / Self Care | Payer: Medicare Other | Attending: Emergency Medicine | Admitting: Emergency Medicine

## 2016-05-19 ENCOUNTER — Emergency Department (HOSPITAL_COMMUNITY): Payer: Medicare Other

## 2016-05-19 DIAGNOSIS — E039 Hypothyroidism, unspecified: Secondary | ICD-10-CM | POA: Diagnosis not present

## 2016-05-19 DIAGNOSIS — I252 Old myocardial infarction: Secondary | ICD-10-CM | POA: Insufficient documentation

## 2016-05-19 DIAGNOSIS — I2510 Atherosclerotic heart disease of native coronary artery without angina pectoris: Secondary | ICD-10-CM | POA: Diagnosis not present

## 2016-05-19 DIAGNOSIS — I5022 Chronic systolic (congestive) heart failure: Secondary | ICD-10-CM | POA: Diagnosis not present

## 2016-05-19 DIAGNOSIS — I10 Essential (primary) hypertension: Secondary | ICD-10-CM | POA: Diagnosis not present

## 2016-05-19 DIAGNOSIS — Z950 Presence of cardiac pacemaker: Secondary | ICD-10-CM | POA: Insufficient documentation

## 2016-05-19 DIAGNOSIS — R0789 Other chest pain: Secondary | ICD-10-CM | POA: Diagnosis not present

## 2016-05-19 DIAGNOSIS — Z79899 Other long term (current) drug therapy: Secondary | ICD-10-CM | POA: Insufficient documentation

## 2016-05-19 DIAGNOSIS — E785 Hyperlipidemia, unspecified: Secondary | ICD-10-CM | POA: Insufficient documentation

## 2016-05-19 DIAGNOSIS — R079 Chest pain, unspecified: Secondary | ICD-10-CM | POA: Diagnosis not present

## 2016-05-19 DIAGNOSIS — Z95810 Presence of automatic (implantable) cardiac defibrillator: Secondary | ICD-10-CM | POA: Diagnosis present

## 2016-05-19 DIAGNOSIS — Z7982 Long term (current) use of aspirin: Secondary | ICD-10-CM | POA: Diagnosis not present

## 2016-05-19 DIAGNOSIS — Z951 Presence of aortocoronary bypass graft: Secondary | ICD-10-CM | POA: Insufficient documentation

## 2016-05-19 LAB — CBC
HCT: 45.4 % (ref 39.0–52.0)
Hemoglobin: 15.2 g/dL (ref 13.0–17.0)
MCH: 31 pg (ref 26.0–34.0)
MCHC: 33.5 g/dL (ref 30.0–36.0)
MCV: 92.7 fL (ref 78.0–100.0)
PLATELETS: 137 10*3/uL — AB (ref 150–400)
RBC: 4.9 MIL/uL (ref 4.22–5.81)
RDW: 14.3 % (ref 11.5–15.5)
WBC: 6.7 10*3/uL (ref 4.0–10.5)

## 2016-05-19 LAB — BASIC METABOLIC PANEL
Anion gap: 9 (ref 5–15)
BUN: 12 mg/dL (ref 6–20)
CALCIUM: 8.9 mg/dL (ref 8.9–10.3)
CO2: 22 mmol/L (ref 22–32)
CREATININE: 1.33 mg/dL — AB (ref 0.61–1.24)
Chloride: 106 mmol/L (ref 101–111)
GFR calc non Af Amer: 50 mL/min — ABNORMAL LOW (ref >60)
GFR, EST AFRICAN AMERICAN: 57 mL/min — AB (ref >60)
Glucose, Bld: 114 mg/dL — ABNORMAL HIGH (ref 65–99)
Potassium: 4.3 mmol/L (ref 3.5–5.1)
SODIUM: 137 mmol/L (ref 135–145)

## 2016-05-19 LAB — I-STAT TROPONIN, ED
TROPONIN I, POC: 0 ng/mL (ref 0.00–0.08)
TROPONIN I, POC: 0 ng/mL (ref 0.00–0.08)

## 2016-05-19 NOTE — ED Notes (Signed)
MD at bedside. 

## 2016-05-19 NOTE — ED Notes (Signed)
Pt reports left side CP around his defibrillator site starting this morning relieved with 1 nitro. Pt alert x4. NAD at this time.

## 2016-05-19 NOTE — ED Provider Notes (Signed)
CSN: [id number]     Arrival date & time 05/19/16  1040 History   First MD Initiated Contact with Patient 05/19/16 1143     Chief Complaint  Patient presents with  . Chest Pain     (Consider location/radiation/quality/duration/timing/severity/associated sxs/prior Treatment) Patient is a 81 y.o. male presenting with chest pain. The history is provided by the patient (Patient complains of left-sided chest pain that has helped some with nitroglycerin).  Chest Pain Pain location:  L chest Pain quality: aching   Pain radiates to:  Does not radiate Pain radiates to the back: no   Pain severity:  Moderate Onset quality:  Sudden Timing:  Constant Progression:  Unchanged Associated symptoms: no abdominal pain, no back pain, no cough, no fatigue and no headache     Past Medical History  Diagnosis Date  . Myocardial infarction [facility])     1995, non-STEMI January 2012  . Ischemic cardiomyopathy     status post CABG with patent vein grafts and an atretic LIMA to his ramus, January 2012  . Ventricular tachycardia ([place])      VT Storm-January 2012- responded to Amiodarone  . Hypertension   . Ventricular fibrillation ([place])   . Chronic systolic congestive heart failure ([place])   . Hyperlipidemia   . Encounter for long-term (current) use of other medications     amiodarone  . Atrial arrhythmia/a fibrillation     not on coumadin 2/2 subdural hematoma  . Subdural hematoma ([facility])     on coumadin  . Automatic implantable cardiac defibrillator in situ     medtronic  . CHF (congestive heart failure) ([place])   . Thyroid disease     hypothyroid  . Automatic implantable cardioverter-defibrillator in situ   . Pacemaker   . Hypothyroidism   . H/O hiatal hernia   . Dysrhythmia    Past Surgical History  Procedure Laterality Date  . Bilateral cranitomies for sub hematomas    . Bypass graft  1995    5  . Coronary artery bypass graft  1995    5  . Insert / replace / remove pacemaker  14   generator repl   . Tonsillectomy    . Inguinal hernia repair Left 05/15/2014    Procedure: HERNIA REPAIR INGUINAL ADULT;  Surgeon: Zenovia Jarred, MD;  Location: [place];  Service: General;  Laterality: Left;  . Insertion of mesh Left 05/15/2014    Procedure: INSERTION OF MESH;  Surgeon: Zenovia Jarred, MD;  Location: [place];  Service: General;  Laterality: Left;  . Implantable cardioverter defibrillator generator change N/A 03/28/2013    Procedure: IMPLANTABLE CARDIOVERTER DEFIBRILLATOR GENERATOR CHANGE;  Surgeon: Deboraha Sprang, MD;  Location: [facility] CATH LAB;  Service: Cardiovascular;  Laterality: N/A;   Family History  Problem Relation Age of Onset  . Cerebral aneurysm Father   . Hypertension Father    Social History  Substance Use Topics  . Smoking status: Never Smoker   . Smokeless tobacco: Former Systems developer  . Alcohol Use: No    Review of Systems  Constitutional: Negative for appetite change and fatigue.  HENT: Negative for congestion, ear discharge and sinus pressure.   Eyes: Negative for discharge.  Respiratory: Negative for cough.   Cardiovascular: Positive for chest pain.  Gastrointestinal: Negative for abdominal pain and diarrhea.  Genitourinary: Negative for frequency and hematuria.  Musculoskeletal: Negative for back pain.  Skin: Negative for rash.  Neurological: Negative for seizures and headaches.  Psychiatric/Behavioral: Negative for hallucinations.  Allergies  Review of patient's allergies indicates no known allergies.  Home Medications   Prior to Admission medications   Medication Sig Start Date End Date Taking? Authorizing Provider  amiodarone (PACERONE) 100 MG tablet Take 50 mg by mouth daily.   Yes Historical Provider, MD  aspirin 81 MG tablet Take 81 mg by mouth daily.     Yes Historical Provider, MD  carvedilol (COREG) 6.25 MG tablet TAKE 1 TABLET BY MOUTH 2 TIMES DAILY WITH A MEAL. 06/27/15  Yes Deboraha Sprang, MD  fenofibrate 160 MG tablet TAKE 1 TABLET  BY MOUTH EVERY DAY. 02/12/16  Yes Thayer Headings, MD  finasteride (PROSCAR) 5 MG tablet Take 5 mg by mouth daily. 04/01/16  Yes Historical Provider, MD  levothyroxine (SYNTHROID, LEVOTHROID) 50 MCG tablet Take 50 mcg by mouth daily.     Yes Historical Provider, MD  lisinopril (PRINIVIL,ZESTRIL) 5 MG tablet TAKE ONE TABLET BY MOUTH DAILY 02/12/16  Yes Thayer Headings, MD  Multiple Vitamin (MULTIVITAMIN) tablet Take 1 tablet by mouth daily.   Yes Historical Provider, MD  niacin (SLO-NIACIN) 500 MG tablet Take 1,000-2,000 mg by mouth daily. Alternates with 1000 mg one day and 2000 mg the next   Yes Historical Provider, MD  nitroGLYCERIN (NITROSTAT) 0.4 MG SL tablet Place 1 tablet (0.4 mg total) under the tongue every 5 (five) minutes as needed for chest pain. For chest pain x 3 doses 03/22/13  Yes Deboraha Sprang, MD  spironolactone (ALDACTONE) 25 MG tablet TAKE 1 TABLET BY MOUTH DAILY 03/06/16  Yes Thayer Headings, MD  Tamsulosin HCl (FLOMAX) 0.4 MG CAPS Take 0.4 mg by mouth daily.  03/31/11  Yes Historical Provider, MD   BP 109/68 mmHg  Pulse 61  Temp(Src) 97.8 F (36.6 C) (Oral)  Resp 19  SpO2 98% Physical Exam  Constitutional: He is oriented to person, place, and time. He appears well-developed.  HENT:  Head: Normocephalic.  Eyes: Conjunctivae and EOM are normal. No scleral icterus.  Neck: Neck supple. No thyromegaly present.  Cardiovascular: Normal rate and regular rhythm.  Exam reveals no gallop and no friction rub.   No murmur heard. Pulmonary/Chest: No stridor. He has no wheezes. He has no rales. He exhibits no tenderness.  Abdominal: He exhibits no distension. There is no tenderness. There is no rebound.  Musculoskeletal: Normal range of motion. He exhibits no edema.  Lymphadenopathy:    He has no cervical adenopathy.  Neurological: He is oriented to person, place, and time. He exhibits normal muscle tone. Coordination normal.  Skin: No rash noted. No erythema.  Psychiatric: He has a  normal mood and affect. His behavior is normal.    ED Course  Procedures (including critical care time) Labs Review Labs Reviewed  BASIC METABOLIC PANEL - Abnormal; Notable for the following:    Glucose, Bld 114 (*)    Creatinine, Ser 1.33 (*)    GFR calc non Af Amer 50 (*)    GFR calc Af Amer 57 (*)    All other components within normal limits  CBC - Abnormal; Notable for the following:    Platelets 137 (*)    All other components within normal limits  I-STAT TROPOININ, ED  Randolm Idol, ED    Imaging Review Dg Chest 2 View  05/19/2016  CLINICAL DATA:  Chest pain EXAM: CHEST  2 VIEW COMPARISON:  05/05/2014 FINDINGS: Postop CABG.  AICD unchanged in position COPD with apical scarring bilaterally.  Mild hyperinflation Negative for heart  failure. Negative for infiltrate or effusion. No mass lesion. IMPRESSION: COPD without acute abnormality. Electronically Signed   By: Franchot Gallo M.D.   On: 05/19/2016 11:18   I have personally reviewed and evaluated these images and lab results as part of my medical decision-making.    MDM   Final diagnoses:  None    Patient with chest pain and coronary artery disease. Labs unremarkable. Cardiology consult    Milton Ferguson, MD 05/19/16 [phone]

## 2016-05-19 NOTE — H&P (Signed)
[name] is an 81 y.o. male.    Primary Cardiologist: Dr. Acie Fredrickson   EP Dr. Caryl Comes PCP:  Geoffery Lyons, MD  Chief Complaint: pt presented with chest pain  HPI: 81 year old male with hx CAD with hx ant wall MI and subsequent CABG in 1995, CHF, ICD- medtronic, VT storm 2012 now controlled on amiodarone, hx of repeated cardiac arrest,  hx subdural hematoma on coumadin.  Last cath 12/2010 patent VGs and an atretic LIMA to Ramus and EF 15-20%.   On last visit with Dr. Caryl Comes his amiodarone was decreased from 200 mg 5 X week to 100 mg daily.    Now with complaints of chest pain around ICD.    He has been in his usual state of health but when he got up today he noticed chest discomfort upper lateral edge of ICD.  The discomfort would come and go and after 20 min or so it did not return.  He did take NTG but they are old and no stinging -tasted like soap.  No associated nausea, SOB, or diaphoresis.  His device did not discharge.  He did not have lightheadedness, dizziness or syncope.  No fevers, diarrhea or melena.  Currently feels well and would like to go home.    EKG with a pacing RBBB and LAFB -no acute changes.  Troponin 0.0.  CXR without acute issues.      Past Medical History  Diagnosis Date  . Myocardial infarction [facility])     1995, non-STEMI January 2012  . Ischemic cardiomyopathy     status post CABG with patent vein grafts and an atretic LIMA to his ramus, January 2012  . Ventricular tachycardia ([place])      VT Storm-January 2012- responded to Amiodarone  . Hypertension   . Ventricular fibrillation ([place])   . Chronic systolic congestive heart failure ([place])   . Hyperlipidemia   . Encounter for long-term (current) use of other medications     amiodarone  . Atrial arrhythmia/a fibrillation     not on coumadin 2/2 subdural hematoma  . Subdural hematoma ([facility])     on coumadin  . Automatic implantable cardiac defibrillator in situ     medtronic  . CHF (congestive  heart failure) ([place])   . Thyroid disease     hypothyroid  . Automatic implantable cardioverter-defibrillator in situ   . Pacemaker   . Hypothyroidism   . H/O hiatal hernia   . Dysrhythmia     Past Surgical History  Procedure Laterality Date  . Bilateral cranitomies for sub hematomas    . Bypass graft  1995    5  . Coronary artery bypass graft  1995    5  . Insert / replace / remove pacemaker  14     generator repl   . Tonsillectomy    . Inguinal hernia repair Left 05/15/2014    Procedure: HERNIA REPAIR INGUINAL ADULT;  Surgeon: Zenovia Jarred, MD;  Location: [place];  Service: General;  Laterality: Left;  . Insertion of mesh Left 05/15/2014    Procedure: INSERTION OF MESH;  Surgeon: Zenovia Jarred, MD;  Location: [place];  Service: General;  Laterality: Left;  . Implantable cardioverter defibrillator generator change N/A 03/28/2013    Procedure: IMPLANTABLE CARDIOVERTER DEFIBRILLATOR GENERATOR CHANGE;  Surgeon: Deboraha Sprang, MD;  [facility] CATH LAB;  Service: Cardiovascular;  Laterality: N/A;    Family History  Problem Relation Age of  Onset  . Cerebral aneurysm Father   . Hypertension Father    Social History:  reports that he has never smoked. He has quit using smokeless tobacco. He reports that he does not drink alcohol or use illicit drugs.  Allergies: No Known Allergies  OUTPATIENT MEDICATIONS: No current facility-administered medications on file prior to encounter.   Current Outpatient Prescriptions on File Prior to Encounter  Medication Sig Dispense Refill  . amiodarone (PACERONE) 100 MG tablet Take 50 mg by mouth daily.    [name] Kitchen aspirin 81 MG tablet Take 81 mg by mouth daily.      . carvedilol (COREG) 6.25 MG tablet TAKE 1 TABLET BY MOUTH 2 TIMES DAILY WITH A MEAL. 180 tablet 2  . fenofibrate 160 MG tablet TAKE 1 TABLET BY MOUTH EVERY DAY. 30 tablet 6  . levothyroxine (SYNTHROID, LEVOTHROID) 50 MCG tablet Take 50 mcg by mouth daily.      [name] Kitchen lisinopril  (PRINIVIL,ZESTRIL) 5 MG tablet TAKE ONE TABLET BY MOUTH DAILY 30 tablet 6  . niacin (SLO-NIACIN) 500 MG tablet Take 1,000-2,000 mg by mouth daily. Alternates with 1000 mg one day and 2000 mg the next    . nitroGLYCERIN (NITROSTAT) 0.4 MG SL tablet Place 1 tablet (0.4 mg total) under the tongue every 5 (five) minutes as needed for chest pain. For chest pain x 3 doses 25 tablet 3  . spironolactone (ALDACTONE) 25 MG tablet TAKE 1 TABLET BY MOUTH DAILY 90 tablet 3  . Tamsulosin HCl (FLOMAX) 0.4 MG CAPS Take 0.4 mg by mouth daily.      Please note the amiodarone is 100 mg tablet and he takes half a tablet daily. Per Dr. Olin Pia note in April.    Results for orders placed or performed during the hospital encounter of 05/19/16 (from the past 48 hour(s))  Basic metabolic panel     Status: Abnormal   Collection Time: 05/19/16 10:55 AM  Result Value Ref Range   Sodium 137 135 - 145 mmol/L   Potassium 4.3 3.5 - 5.1 mmol/L   Chloride 106 101 - 111 mmol/L   CO2 22 22 - 32 mmol/L   Glucose, Bld 114 (H) 65 - 99 mg/dL   BUN 12 6 - 20 mg/dL   Creatinine, Ser 1.33 (H) 0.61 - 1.24 mg/dL   Calcium 8.9 8.9 - 10.3 mg/dL   GFR calc non Af Amer 50 (L) >60 mL/min   GFR calc Af Amer 57 (L) >60 mL/min    Comment: (NOTE) The eGFR has been calculated using the CKD EPI equation. This calculation has not been validated in all clinical situations. eGFR's persistently <60 mL/min signify possible Chronic Kidney Disease.    Anion gap 9 5 - 15  CBC     Status: Abnormal   Collection Time: 05/19/16 10:55 AM  Result Value Ref Range   WBC 6.7 4.0 - 10.5 K/uL   RBC 4.90 4.22 - 5.81 MIL/uL   Hemoglobin 15.2 13.0 - 17.0 g/dL   HCT 45.4 39.0 - 52.0 %   MCV 92.7 78.0 - 100.0 fL   MCH 31.0 26.0 - 34.0 pg   MCHC 33.5 30.0 - 36.0 g/dL   RDW 14.3 11.5 - 15.5 %   Platelets 137 (L) 150 - 400 K/uL  I-stat troponin, ED     Status: None   Collection Time: 05/19/16 11:04 AM  Result Value Ref Range   Troponin i, poc 0.00 0.00 -  0.08 ng/mL   Comment 3  Comment: Due to the release kinetics of cTnI, a negative result within the first hours of the onset of symptoms does not rule out myocardial infarction with certainty. If myocardial infarction is still suspected, repeat the test at appropriate intervals.    Dg Chest 2 View  05/19/2016  CLINICAL DATA:  Chest pain EXAM: CHEST  2 VIEW COMPARISON:  05/05/2014 FINDINGS: Postop CABG.  AICD unchanged in position COPD with apical scarring bilaterally.  Mild hyperinflation Negative for heart failure. Negative for infiltrate or effusion. No mass lesion. IMPRESSION: COPD without acute abnormality. Electronically Signed   By: Franchot Gallo M.D.   On: 05/19/2016 11:18    ROS: General:no colds or fevers, no weight changes Skin:no rashes or ulcers HEENT:no blurred vision, no congestion CV:see HPI PUL:see HPI GI:no diarrhea constipation or melena, no indigestion GU:no hematuria, no dysuria MS:no joint pain, no claudication Neuro:no syncope, no lightheadedness Endo:no diabetes, + thyroid disease- recent lab was stable.    Blood pressure 109/68, pulse 61, temperature 97.8 F (36.6 C), temperature source Oral, resp. rate 19, SpO2 98 %. PE:  General:Pleasant affect, NAD Skin:Warm and dry, brisk capillary refill HEENT:normocephalic, sclera clear, mucus membranes moist Neck:supple, no JVD, no bruits  Heart:S1S2 RRR without murmur, gallup, rub or click, no pain to palpation at ICD site Lungs:clear without rales, rhonchi, or wheezes FBP:ZWCH, non tender, + BS, do not palpate liver spleen or masses Ext:no lower ext edema, 2+ pedal pulses, 2+ radial pulses, no recreation of pain with moving Lt arm in different directions.  Neuro:alert and oriented X 3, MAE, follows commands, + facial symmetry   Assessment/Plan  Principal Problem:   Chest pain troponins poc, neg X 2  At 0.0 no pain now, no EKG changes.  Possible d/c and follow up with Dr. Acie Fredrickson?  Active Problems:    Hyperlipidemia LDL goal <85   Chronic systolic congestive heart failure ([place])   ICD (implantable cardioverter-defibrillator) in place   CAD (coronary artery disease)   Cecilie Kicks Nurse Practitioner Certified [place] Pager [phone] or after 5pm or weekends call [phone] 05/19/2016, 2:33 PM  The patient has been seen in conjunction with Cecilie Kicks, NP. All aspects of care have been considered and discussed. The patient has been personally interviewed, examined, and all clinical data has been reviewed.   Musculoskeletal discomfort around the device site. The discomfort is completely resolved and cardiac marker does not reveal any evidence of ischemia.  I do not believe that the presentation to the emergency room represents cardiac instability, unstable angina, or any acute cardiac issue.  The patient feels comfortable going home and I think it is appropriate to discharge him from the emergency room.

## 2016-05-19 NOTE — ED Notes (Signed)
Pt ambulated to room from waiting room. 

## 2016-05-19 NOTE — ED Notes (Signed)
Pt verbalized understanding of d/c instructions and follow-up care. No further questions/concerns, VSS, ambulatory w/ steady gait (refused wheelchair) 

## 2016-05-19 NOTE — ED Provider Notes (Signed)
Pt is seen by Cardiology - Dr. Tamala Julian states pt can go home, Pt has slight Cr.increase - minimal - appears stable for d/c  Noemi Chapel, MD 05/19/16 1626

## 2016-05-19 NOTE — Discharge Instructions (Signed)

## 2016-05-21 DIAGNOSIS — M1990 Unspecified osteoarthritis, unspecified site: Secondary | ICD-10-CM | POA: Diagnosis not present

## 2016-05-21 DIAGNOSIS — I499 Cardiac arrhythmia, unspecified: Secondary | ICD-10-CM | POA: Diagnosis not present

## 2016-05-21 DIAGNOSIS — K4090 Unilateral inguinal hernia, without obstruction or gangrene, not specified as recurrent: Secondary | ICD-10-CM | POA: Diagnosis not present

## 2016-05-21 DIAGNOSIS — I2510 Atherosclerotic heart disease of native coronary artery without angina pectoris: Secondary | ICD-10-CM | POA: Diagnosis not present

## 2016-05-21 DIAGNOSIS — I10 Essential (primary) hypertension: Secondary | ICD-10-CM | POA: Diagnosis not present

## 2016-05-21 DIAGNOSIS — R413 Other amnesia: Secondary | ICD-10-CM | POA: Diagnosis not present

## 2016-05-21 DIAGNOSIS — Z950 Presence of cardiac pacemaker: Secondary | ICD-10-CM | POA: Diagnosis not present

## 2016-05-21 DIAGNOSIS — N401 Enlarged prostate with lower urinary tract symptoms: Secondary | ICD-10-CM | POA: Diagnosis not present

## 2016-05-21 DIAGNOSIS — E784 Other hyperlipidemia: Secondary | ICD-10-CM | POA: Diagnosis not present

## 2016-05-21 DIAGNOSIS — Z6824 Body mass index (BMI) 24.0-24.9, adult: Secondary | ICD-10-CM | POA: Diagnosis not present

## 2016-07-14 ENCOUNTER — Ambulatory Visit (INDEPENDENT_AMBULATORY_CARE_PROVIDER_SITE_OTHER): Payer: Medicare Other | Admitting: *Deleted

## 2016-07-14 DIAGNOSIS — I472 Ventricular tachycardia, unspecified: Secondary | ICD-10-CM

## 2016-07-14 DIAGNOSIS — I255 Ischemic cardiomyopathy: Secondary | ICD-10-CM | POA: Diagnosis not present

## 2016-07-14 NOTE — Progress Notes (Signed)
Remote ICD transmission.   

## 2016-07-15 ENCOUNTER — Other Ambulatory Visit: Payer: Self-pay | Admitting: Internal Medicine

## 2016-07-15 LAB — CUP PACEART REMOTE DEVICE CHECK
Battery Voltage: 3 V
Brady Statistic AP VP Percent: 3.59 %
Brady Statistic AP VS Percent: 84.68 %
Brady Statistic AS VP Percent: 1.31 %
Brady Statistic AS VS Percent: 10.42 %
Date Time Interrogation Session: 20170717041708
HighPow Impedance: 39 Ohm
HighPow Impedance: 56 Ohm
Implantable Lead Location: 753859
Implantable Lead Location: 753860
Implantable Lead Model: 6940
Lead Channel Impedance Value: 380 Ohm
Lead Channel Impedance Value: 494 Ohm
Lead Channel Pacing Threshold Amplitude: 0.625 V
Lead Channel Pacing Threshold Pulse Width: 0.4 ms
Lead Channel Sensing Intrinsic Amplitude: 1.75 mV
Lead Channel Sensing Intrinsic Amplitude: 1.75 mV
Lead Channel Sensing Intrinsic Amplitude: 10.125 mV
Lead Channel Setting Pacing Pulse Width: 0.4 ms
MDC IDC LEAD IMPLANT DT: 19990520
MDC IDC LEAD IMPLANT DT: 20011228
MDC IDC MSMT BATTERY REMAINING LONGEVITY: 79 mo
MDC IDC MSMT LEADCHNL RA PACING THRESHOLD AMPLITUDE: 0.75 V
MDC IDC MSMT LEADCHNL RA PACING THRESHOLD PULSEWIDTH: 0.4 ms
MDC IDC MSMT LEADCHNL RV IMPEDANCE VALUE: 399 Ohm
MDC IDC MSMT LEADCHNL RV SENSING INTR AMPL: 10.125 mV
MDC IDC SET LEADCHNL RA PACING AMPLITUDE: 2 V
MDC IDC SET LEADCHNL RV PACING AMPLITUDE: 2.5 V
MDC IDC SET LEADCHNL RV SENSING SENSITIVITY: 0.3 mV
MDC IDC STAT BRADY RA PERCENT PACED: 88.27 %
MDC IDC STAT BRADY RV PERCENT PACED: 4.9 %

## 2016-07-15 MED ORDER — CARVEDILOL 6.25 MG PO TABS: ORAL_TABLET | ORAL | Status: DC

## 2016-07-16 ENCOUNTER — Encounter: Payer: Self-pay | Admitting: Cardiology

## 2016-07-21 ENCOUNTER — Telehealth: Payer: Self-pay

## 2016-07-21 ENCOUNTER — Other Ambulatory Visit: Payer: Self-pay

## 2016-07-21 NOTE — Telephone Encounter (Signed)
Rise Paganini, MD at 04/14/2016 10:05 AM   amiodarone (PACERONE) 100 MG tablet Take 50 mg by mouth daily    Patient Instructions   Medication Instructions: - Your physician recommends that you continue on your current medications as directed. Please refer to the Current Medication list given to you today

## 2016-07-21 NOTE — Telephone Encounter (Signed)
Please call the patient to clarify. I'm not sure what he is taking. When Dr. Caryl Comes saw him last he put him on amio 100 mg daily.

## 2016-07-22 ENCOUNTER — Other Ambulatory Visit: Payer: Self-pay | Admitting: Internal Medicine

## 2016-07-22 MED ORDER — AMIODARONE HCL 100 MG PO TABS: 50.0000 mg | ORAL_TABLET | Freq: Every day | ORAL | 3 refills | Status: DC

## 2016-07-23 NOTE — Telephone Encounter (Signed)
Approved    Disp Refills Start End  amiodarone (PACERONE) 100 MG tablet 45 tablet 3 07/22/2016   Sig - Route:  Take 0.5 tablets (50 mg total) by mouth daily. - Oral  Class:  Normal  DAW:  No  Authorizing Provider:  Deboraha Sprang, MD  Ordering User:  Derl Barrow

## 2016-07-30 ENCOUNTER — Encounter: Payer: Self-pay | Admitting: Cardiology

## 2016-10-13 ENCOUNTER — Ambulatory Visit (INDEPENDENT_AMBULATORY_CARE_PROVIDER_SITE_OTHER): Payer: Medicare Other | Admitting: *Deleted

## 2016-10-13 DIAGNOSIS — I255 Ischemic cardiomyopathy: Secondary | ICD-10-CM

## 2016-10-13 LAB — CUP PACEART REMOTE DEVICE CHECK
Battery Remaining Longevity: 75 mo
Battery Voltage: 3 V
Brady Statistic AP VP Percent: 3.41 %
Brady Statistic AS VS Percent: 11.03 %
Brady Statistic RA Percent Paced: 87.77 %
Date Time Interrogation Session: 20171016083726
HIGH POWER IMPEDANCE MEASURED VALUE: 53 Ohm
HighPow Impedance: 40 Ohm
Implantable Lead Implant Date: 19990520
Implantable Lead Location: 753859
Implantable Lead Model: 6947
Lead Channel Impedance Value: 437 Ohm
Lead Channel Impedance Value: 494 Ohm
Lead Channel Pacing Threshold Pulse Width: 0.4 ms
Lead Channel Sensing Intrinsic Amplitude: 1.25 mV
Lead Channel Sensing Intrinsic Amplitude: 9 mV
Lead Channel Sensing Intrinsic Amplitude: 9 mV
Lead Channel Setting Pacing Amplitude: 2 V
Lead Channel Setting Pacing Pulse Width: 0.4 ms
Lead Channel Setting Sensing Sensitivity: 0.3 mV
MDC IDC LEAD IMPLANT DT: 20011228
MDC IDC LEAD LOCATION: 753860
MDC IDC LEAD MODEL: 6940
MDC IDC MSMT LEADCHNL RA IMPEDANCE VALUE: 380 Ohm
MDC IDC MSMT LEADCHNL RA PACING THRESHOLD AMPLITUDE: 0.625 V
MDC IDC MSMT LEADCHNL RA SENSING INTR AMPL: 1.25 mV
MDC IDC MSMT LEADCHNL RV PACING THRESHOLD AMPLITUDE: 0.625 V
MDC IDC MSMT LEADCHNL RV PACING THRESHOLD PULSEWIDTH: 0.4 ms
MDC IDC SET LEADCHNL RV PACING AMPLITUDE: 2.5 V
MDC IDC STAT BRADY AP VS PERCENT: 84.36 %
MDC IDC STAT BRADY AS VP PERCENT: 1.2 %
MDC IDC STAT BRADY RV PERCENT PACED: 4.61 %

## 2016-10-13 NOTE — Progress Notes (Signed)
Remote ICD transmission.   

## 2016-10-15 ENCOUNTER — Encounter: Payer: Self-pay | Admitting: Cardiology

## 2016-10-29 DIAGNOSIS — Z125 Encounter for screening for malignant neoplasm of prostate: Secondary | ICD-10-CM | POA: Diagnosis not present

## 2016-10-29 DIAGNOSIS — I10 Essential (primary) hypertension: Secondary | ICD-10-CM | POA: Diagnosis not present

## 2016-10-30 ENCOUNTER — Other Ambulatory Visit: Payer: Self-pay | Admitting: Cardiovascular Disease

## 2016-10-31 ENCOUNTER — Encounter: Payer: Self-pay | Admitting: Cardiology

## 2016-11-05 DIAGNOSIS — K4090 Unilateral inguinal hernia, without obstruction or gangrene, not specified as recurrent: Secondary | ICD-10-CM | POA: Diagnosis not present

## 2016-11-05 DIAGNOSIS — I2510 Atherosclerotic heart disease of native coronary artery without angina pectoris: Secondary | ICD-10-CM | POA: Diagnosis not present

## 2016-11-05 DIAGNOSIS — E784 Other hyperlipidemia: Secondary | ICD-10-CM | POA: Diagnosis not present

## 2016-11-05 DIAGNOSIS — I498 Other specified cardiac arrhythmias: Secondary | ICD-10-CM | POA: Diagnosis not present

## 2016-11-05 DIAGNOSIS — Z23 Encounter for immunization: Secondary | ICD-10-CM | POA: Diagnosis not present

## 2016-11-05 DIAGNOSIS — M1990 Unspecified osteoarthritis, unspecified site: Secondary | ICD-10-CM | POA: Diagnosis not present

## 2016-11-05 DIAGNOSIS — Z950 Presence of cardiac pacemaker: Secondary | ICD-10-CM | POA: Diagnosis not present

## 2016-11-05 DIAGNOSIS — N401 Enlarged prostate with lower urinary tract symptoms: Secondary | ICD-10-CM | POA: Diagnosis not present

## 2016-11-05 DIAGNOSIS — Z6825 Body mass index (BMI) 25.0-25.9, adult: Secondary | ICD-10-CM | POA: Diagnosis not present

## 2016-11-05 DIAGNOSIS — Z1389 Encounter for screening for other disorder: Secondary | ICD-10-CM | POA: Diagnosis not present

## 2016-11-05 DIAGNOSIS — Z0000 Encounter for general adult medical examination without abnormal findings: Secondary | ICD-10-CM | POA: Diagnosis not present

## 2016-11-05 DIAGNOSIS — I10 Essential (primary) hypertension: Secondary | ICD-10-CM | POA: Diagnosis not present

## 2016-11-13 ENCOUNTER — Telehealth: Payer: Self-pay | Admitting: Cardiovascular Disease

## 2016-11-13 DIAGNOSIS — D235 Other benign neoplasm of skin of trunk: Secondary | ICD-10-CM | POA: Diagnosis not present

## 2016-11-13 DIAGNOSIS — D1801 Hemangioma of skin and subcutaneous tissue: Secondary | ICD-10-CM | POA: Diagnosis not present

## 2016-11-13 DIAGNOSIS — L570 Actinic keratosis: Secondary | ICD-10-CM | POA: Diagnosis not present

## 2016-11-13 DIAGNOSIS — L821 Other seborrheic keratosis: Secondary | ICD-10-CM | POA: Diagnosis not present

## 2016-11-13 NOTE — Telephone Encounter (Signed)
Called [facility] pharmacy concerning a request for an  alternative stating that Donepezil and Amiodarone can cause QT elongation. Per Sharyn Lull, RN, Donepezil is not on pt's medication list and that pt needed to stay on Amiodarone so pt would not go into cardiac arrest. Pharmacy stated that pt has been on this medication for awhile and that they would contact the other doctor that prescribe this medication. I informed the pharmacist that the pt has to stay on Amiodarone, per Sharyn Lull, RN. Pharmacist verbalized understanding.

## 2016-11-14 ENCOUNTER — Other Ambulatory Visit: Payer: Self-pay | Admitting: *Deleted

## 2016-11-14 MED ORDER — AMIODARONE HCL 100 MG PO TABS: 50.0000 mg | ORAL_TABLET | Freq: Every day | ORAL | 2 refills | Status: DC

## 2016-12-08 DIAGNOSIS — Z1212 Encounter for screening for malignant neoplasm of rectum: Secondary | ICD-10-CM | POA: Diagnosis not present

## 2016-12-10 ENCOUNTER — Other Ambulatory Visit: Payer: Self-pay | Admitting: Cardiovascular Disease

## 2017-01-07 DIAGNOSIS — M9901 Segmental and somatic dysfunction of cervical region: Secondary | ICD-10-CM | POA: Diagnosis not present

## 2017-01-07 DIAGNOSIS — M5031 Other cervical disc degeneration,  high cervical region: Secondary | ICD-10-CM | POA: Diagnosis not present

## 2017-01-12 ENCOUNTER — Ambulatory Visit (INDEPENDENT_AMBULATORY_CARE_PROVIDER_SITE_OTHER): Payer: Medicare Other | Admitting: *Deleted

## 2017-01-12 DIAGNOSIS — M5031 Other cervical disc degeneration,  high cervical region: Secondary | ICD-10-CM | POA: Diagnosis not present

## 2017-01-12 DIAGNOSIS — I255 Ischemic cardiomyopathy: Secondary | ICD-10-CM

## 2017-01-12 DIAGNOSIS — M9901 Segmental and somatic dysfunction of cervical region: Secondary | ICD-10-CM | POA: Diagnosis not present

## 2017-01-16 LAB — CUP PACEART REMOTE DEVICE CHECK
Brady Statistic AP VP Percent: 2.2 %
Brady Statistic AS VP Percent: 0.96 %
Brady Statistic RA Percent Paced: 81.69 %
Brady Statistic RV Percent Paced: 3.14 %
Date Time Interrogation Session: 20180115051607
HIGH POWER IMPEDANCE MEASURED VALUE: 40 Ohm
HIGH POWER IMPEDANCE MEASURED VALUE: 60 Ohm
Implantable Lead Implant Date: 19990520
Implantable Lead Location: 753860
Implantable Lead Model: 6940
Implantable Lead Model: 6947
Lead Channel Impedance Value: 380 Ohm
Lead Channel Impedance Value: 437 Ohm
Lead Channel Pacing Threshold Amplitude: 0.625 V
Lead Channel Pacing Threshold Pulse Width: 0.4 ms
Lead Channel Sensing Intrinsic Amplitude: 1.25 mV
Lead Channel Sensing Intrinsic Amplitude: 9.375 mV
Lead Channel Setting Pacing Amplitude: 2.5 V
Lead Channel Setting Pacing Pulse Width: 0.4 ms
Lead Channel Setting Sensing Sensitivity: 0.3 mV
MDC IDC LEAD IMPLANT DT: 20011228
MDC IDC LEAD LOCATION: 753859
MDC IDC MSMT BATTERY REMAINING LONGEVITY: 72 mo
MDC IDC MSMT BATTERY VOLTAGE: 3 V
MDC IDC MSMT LEADCHNL RA PACING THRESHOLD AMPLITUDE: 0.75 V
MDC IDC MSMT LEADCHNL RA PACING THRESHOLD PULSEWIDTH: 0.4 ms
MDC IDC MSMT LEADCHNL RA SENSING INTR AMPL: 1.25 mV
MDC IDC MSMT LEADCHNL RV IMPEDANCE VALUE: 513 Ohm
MDC IDC MSMT LEADCHNL RV SENSING INTR AMPL: 9.375 mV
MDC IDC PG IMPLANT DT: 20140331
MDC IDC SET LEADCHNL RA PACING AMPLITUDE: 2 V
MDC IDC STAT BRADY AP VS PERCENT: 80.95 %
MDC IDC STAT BRADY AS VS PERCENT: 15.9 %

## 2017-01-16 NOTE — Progress Notes (Signed)
Remote ICD transmission.   

## 2017-01-21 ENCOUNTER — Encounter: Payer: Self-pay | Admitting: Cardiology

## 2017-01-22 ENCOUNTER — Ambulatory Visit (INDEPENDENT_AMBULATORY_CARE_PROVIDER_SITE_OTHER): Payer: Medicare Other | Admitting: Cardiovascular Disease

## 2017-01-22 ENCOUNTER — Encounter: Payer: Self-pay | Admitting: Cardiovascular Disease

## 2017-01-22 VITALS — BP 112/74 | HR 66 | Ht 71.0 in | Wt 166.8 lb

## 2017-01-22 DIAGNOSIS — I5022 Chronic systolic (congestive) heart failure: Secondary | ICD-10-CM

## 2017-01-22 DIAGNOSIS — I255 Ischemic cardiomyopathy: Secondary | ICD-10-CM | POA: Diagnosis not present

## 2017-01-22 DIAGNOSIS — I2510 Atherosclerotic heart disease of native coronary artery without angina pectoris: Secondary | ICD-10-CM
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
## 2017-01-22 NOTE — Patient Instructions (Signed)
Medication Instructions:  Your physician recommends that you continue on your current medications as directed. Please refer to the Current Medication list given to you today.   Labwork: None Ordered   Testing/Procedures: None Ordered   Follow-Up: Your physician wants you to follow-up in: 6 months with Dr. Nahser.  You will receive a reminder letter in the mail two months in advance. If you don't receive a letter, please call our office to schedule the follow-up appointment.   If you need a refill on your cardiac medications before your next appointment, please call your pharmacy.   Thank you for choosing [facility]! Issai Werling, RN [phone]    

## 2017-01-27 DIAGNOSIS — H25813 Combined forms of age-related cataract, bilateral: Secondary | ICD-10-CM | POA: Diagnosis not present

## 2017-03-02 ENCOUNTER — Other Ambulatory Visit: Payer: Self-pay | Admitting: Cardiovascular Disease

## 2017-03-30 ENCOUNTER — Encounter: Payer: Self-pay | Admitting: Internal Medicine

## 2017-03-31 DIAGNOSIS — L820 Inflamed seborrheic keratosis: Secondary | ICD-10-CM | POA: Diagnosis not present

## 2017-04-10 ENCOUNTER — Other Ambulatory Visit: Payer: Self-pay | Admitting: Internal Medicine

## 2017-04-14 ENCOUNTER — Encounter: Payer: Self-pay | Admitting: Internal Medicine

## 2017-04-14 ENCOUNTER — Ambulatory Visit (INDEPENDENT_AMBULATORY_CARE_PROVIDER_SITE_OTHER): Payer: Medicare Other | Admitting: Internal Medicine

## 2017-04-14 VITALS — BP 116/68 | HR 72 | Ht 71.0 in | Wt 165.0 lb

## 2017-04-14 DIAGNOSIS — I5022 Chronic systolic (congestive) heart failure: Secondary | ICD-10-CM

## 2017-04-14 DIAGNOSIS — I2589 Other forms of chronic ischemic heart disease: Secondary | ICD-10-CM

## 2017-04-14 DIAGNOSIS — I255 Ischemic cardiomyopathy: Secondary | ICD-10-CM

## 2017-04-14 DIAGNOSIS — Z95810 Presence of automatic (implantable) cardiac defibrillator: Secondary | ICD-10-CM

## 2017-04-14 DIAGNOSIS — I472 Ventricular tachycardia, unspecified: Secondary | ICD-10-CM

## 2017-04-14 DIAGNOSIS — Z79899 Other long term (current) drug therapy: Secondary | ICD-10-CM

## 2017-04-14 LAB — CUP PACEART INCLINIC DEVICE CHECK
Battery Remaining Longevity: 68 mo
Battery Voltage: 2.99 V
Brady Statistic AS VS Percent: 12.78 %
Brady Statistic RV Percent Paced: 3.93 %
HighPow Impedance: 40 Ohm
HighPow Impedance: 56 Ohm
Implantable Lead Implant Date: 19990520
Implantable Lead Location: 753859
Implantable Lead Model: 6940
Implantable Pulse Generator Implant Date: 20140331
Lead Channel Impedance Value: 380 Ohm
Lead Channel Impedance Value: 456 Ohm
Lead Channel Pacing Threshold Amplitude: 0.625 V
Lead Channel Pacing Threshold Pulse Width: 0.4 ms
Lead Channel Sensing Intrinsic Amplitude: 0.75 mV
Lead Channel Sensing Intrinsic Amplitude: 1 mV
Lead Channel Sensing Intrinsic Amplitude: 10.625 mV
Lead Channel Setting Pacing Amplitude: 2 V
MDC IDC LEAD IMPLANT DT: 20011228
MDC IDC LEAD LOCATION: 753860
MDC IDC MSMT LEADCHNL RV IMPEDANCE VALUE: 399 Ohm
MDC IDC MSMT LEADCHNL RV PACING THRESHOLD AMPLITUDE: 0.5 V
MDC IDC MSMT LEADCHNL RV PACING THRESHOLD PULSEWIDTH: 0.4 ms
MDC IDC MSMT LEADCHNL RV SENSING INTR AMPL: 10.875 mV
MDC IDC SESS DTM: 20180417191826
MDC IDC SET LEADCHNL RV PACING AMPLITUDE: 2.5 V
MDC IDC SET LEADCHNL RV PACING PULSEWIDTH: 0.4 ms
MDC IDC SET LEADCHNL RV SENSING SENSITIVITY: 0.3 mV
MDC IDC STAT BRADY AP VP PERCENT: 2.87 %
MDC IDC STAT BRADY AP VS PERCENT: 83.27 %
MDC IDC STAT BRADY AS VP PERCENT: 1.07 %
MDC IDC STAT BRADY RA PERCENT PACED: 84.42 %

## 2017-04-14 NOTE — Patient Instructions (Signed)
Medication Instructions: -Your physician recommends that you continue on your current medications as directed. Please refer to the Current Medication list given to you today.   Labwork: Your physician recommends that you have lab work today: CMET and TSH   Procedures/Testing: None Ordered  Follow-Up: -Remote monitoring is used to monitor your Pacemaker from home. This monitoring reduces the number of office visits required to check your device to one time per year. It allows [place] to keep an eye on the functioning of your device to ensure it is working properly. You are scheduled for a device check from home on 07/14/17. You may send your transmission at any time that day. If you have a wireless device, the transmission will be sent automatically. After your physician reviews your transmission, you will receive a postcard with your next transmission date.    -Your physician wants you to follow-up in 1 YEAR with Chanetta Marshall, NP for Dr. Caryl Comes. You will receive a reminder letter in the mail two months in advance. If you don't receive a letter, please call our office to schedule the follow-up appointment.    Any Additional Special Instructions Will Be Listed Below (If Applicable).     If you need a refill on your cardiac medications before your next appointment, please call your pharmacy.

## 2017-04-14 NOTE — Progress Notes (Signed)
Patient Care Team: Burnard Bunting, MD as PCP - General (Internal Medicine)   HPI  [name] is a 82 y.o. male Seen in followup for VT and cardiac arrestin the setting of ischemic cardiomyopathy with prior myocardial infarction, prior bypass surgery in 1995.  History of ventricular tachycardia storm >>> amiodarone high doses-- poorly tolerated because of nausea and jitteriness   He has  recent catheterization (January 2012) whiich demonstrated patent vein grafts with an atretic LIMA to his ramus. Ejection fraction was 15-20%.   Date TSH LFTs Cr LDL  4/16 4.12 19     4/17  3.44 17 1.33   11/17 6.48  1.3 85           The patient denies chest pain, shortness of breath, nocturnal dyspnea, orthopnea or peripheral edema.  There have been no palpitations, lightheadedness or syncope.  Patient denies symptoms of GI intolerance, sun sensitivity, neurological symptoms attributable to amiodarone.  Surveillance laboratories were in normal limits when checked 6 months ago ( [facility] )     Past Medical History:  Diagnosis Date  . Atrial arrhythmia/a fibrillation    not on coumadin 2/2 subdural hematoma  . Automatic implantable cardiac defibrillator in situ    medtronic  . Automatic implantable cardioverter-defibrillator in situ   . CHF (congestive heart failure) ([place])   . Chronic systolic congestive heart failure ([place])   . Dysrhythmia   . Encounter for long-term (current) use of other medications    amiodarone  . H/O hiatal hernia   . Hyperlipidemia   . Hypertension   . Hypothyroidism   . Ischemic cardiomyopathy    status post CABG with patent vein grafts and an atretic LIMA to his ramus, January 2012  . Myocardial infarction [facility])    1995, non-STEMI January 2012  . Pacemaker   . Subdural hematoma (H[facility])    on coumadin  . Thyroid disease    hypothyroid  . Ventricular fibrillation ([place])   . Ventricular tachycardia ([place])     VT Storm-January 2012- responded  to Amiodarone    Past Surgical History:  Procedure Laterality Date  . bilateral cranitomies for sub hematomas    . BYPASS GRAFT  1995   5  . CORONARY ARTERY BYPASS GRAFT  1995   5  . IMPLANTABLE CARDIOVERTER DEFIBRILLATOR GENERATOR CHANGE N/A 03/28/2013   Procedure: IMPLANTABLE CARDIOVERTER DEFIBRILLATOR GENERATOR CHANGE;  Surgeon: Deboraha Sprang, MD;  Location: [facility] CATH LAB;  Service: Cardiovascular;  Laterality: N/A;  . INGUINAL HERNIA REPAIR Left 05/15/2014   Procedure: HERNIA REPAIR INGUINAL ADULT;  Surgeon: Zenovia Jarred, MD;  Location: [place];  Service: General;  Laterality: Left;  . INSERT / REPLACE / REMOVE PACEMAKER  14    generator repl   . INSERTION OF MESH Left 05/15/2014   Procedure: INSERTION OF MESH;  Surgeon: Zenovia Jarred, MD;  Location: [place];  Service: General;  Laterality: Left;  . TONSILLECTOMY      Current Outpatient Prescriptions  Medication Sig Dispense Refill  . amiodarone (PACERONE) 100 MG tablet Take 0.5 tablets (50 mg total) by mouth daily. 45 tablet 2  . aspirin 81 MG tablet Take 81 mg by mouth daily.      . carvedilol (COREG) 6.25 MG tablet TAKE 1 TABLET BY MOUTH 2 TIMES DAILY WITH A MEAL. 180 tablet 0  . donepezil (ARICEPT) 5 MG tablet Take 5 mg by mouth at bedtime.    . fenofibrate 160 MG tablet  TAKE 1 TABLET BY MOUTH EVERY DAY. 30 tablet 5  . finasteride (PROSCAR) 5 MG tablet Take 5 mg by mouth daily.  5  . levothyroxine (SYNTHROID, LEVOTHROID) 50 MCG tablet Take 50 mcg by mouth daily.      [name] Kitchen lisinopril (PRINIVIL,ZESTRIL) 5 MG tablet TAKE ONE TABLET BY MOUTH DAILY 30 tablet 1  . Multiple Vitamin (MULTIVITAMIN) tablet Take 1 tablet by mouth daily.    . niacin (SLO-NIACIN) 500 MG tablet Take 1,000-2,000 mg by mouth daily. Alternates with 1000 mg one day and 2000 mg the next    . nitroGLYCERIN (NITROSTAT) 0.4 MG SL tablet Place 1 tablet (0.4 mg total) under the tongue every 5 (five) minutes as needed for chest pain. For chest pain x 3 doses 25 tablet 3    . spironolactone (ALDACTONE) 25 MG tablet TAKE 1 TABLET BY MOUTH DAILY 90 tablet 2  . Tamsulosin HCl (FLOMAX) 0.4 MG CAPS Take 0.4 mg by mouth daily.      No current facility-administered medications for this visit.     No Known Allergies  Review of Systems negative except from HPI and PMH  Physical Exam BP 116/68   Pulse 72   Ht 5\' 11"  (1.803 m)   Wt 165 lb (74.8 kg)   SpO2 98%   BMI 23.01 kg/m  Well developed and well nourished in no acute distress HENT normal E scleral and icterus clear Neck Supple JVP flat; carotids brisk and full Clear to ausculation  Regular rate and rhythm, no murmurs gallops or rub Soft with active bowel sounds No clubbing cyanosis  Edema Alert and oriented, grossly normal motor and sensory function Skin Warm and Dry  Optivol screen was normal  ECG personally reviewed A paced 65 23/16/46 -78  Assessment and  Plan  Ischemic cardiomyopathy  Congestive heart failure-chronic-systolic  Ventricular tachycardia  High risk medication surveillance  Hypothyroidism  Atrial noise being classified by the ICD as atrial fibrillation;  Reproduced by mechanical manipulation   Defibrillator-Medtronic  The patient's device was interrogated.  The information was reviewed. No changes were made in the programming.    No intercurrent Ventricular tachycardia \\Without  symptoms of ischemia  Check surveillance labs today  Euvolemic continue current meds  Last a very obtained from [facility] 11/17 was increased compared 4/17.  Review of the old record demonstrates that his Synthroid dose was not changed. I suspect he will turn out to be increasingly hypothyroid. We'll check his TSH again today.  I'm not sure why he is on niacin and fenofibrate; will defer this to primary physician's

## 2017-04-15 LAB — COMPREHENSIVE METABOLIC PANEL
ALK PHOS: 35 IU/L — AB (ref 39–117)
ALT: 20 IU/L (ref 0–44)
AST: 33 IU/L (ref 0–40)
Albumin/Globulin Ratio: 1.6 (ref 1.2–2.2)
Albumin: 4.1 g/dL (ref 3.5–4.8)
BUN/Creatinine Ratio: 13 (ref 10–24)
BUN: 17 mg/dL (ref 8–27)
Bilirubin Total: 0.5 mg/dL (ref 0.0–1.2)
CO2: 24 mmol/L (ref 18–29)
CREATININE: 1.27 mg/dL (ref 0.76–1.27)
Calcium: 9.1 mg/dL (ref 8.6–10.2)
Chloride: 106 mmol/L (ref 96–106)
GFR calc Af Amer: 62 mL/min/{1.73_m2} (ref >59)
GFR calc non Af Amer: 53 mL/min/{1.73_m2} — ABNORMAL LOW (ref >59)
GLUCOSE: 112 mg/dL — AB (ref 65–99)
Globulin, Total: 2.6 g/dL (ref 1.5–4.5)
Potassium: 5 mmol/L (ref 3.5–5.2)
Sodium: 144 mmol/L (ref 134–144)
Total Protein: 6.7 g/dL (ref 6.0–8.5)

## 2017-04-15 LAB — TSH: TSH: 3.95 u[IU]/mL (ref 0.450–4.500)

## 2017-04-27 ENCOUNTER — Other Ambulatory Visit: Payer: Self-pay | Admitting: Cardiovascular Disease

## 2017-05-04 DIAGNOSIS — Z6824 Body mass index (BMI) 24.0-24.9, adult: Secondary | ICD-10-CM | POA: Diagnosis not present

## 2017-05-04 DIAGNOSIS — Z1389 Encounter for screening for other disorder: Secondary | ICD-10-CM | POA: Diagnosis not present

## 2017-05-04 DIAGNOSIS — I499 Cardiac arrhythmia, unspecified: Secondary | ICD-10-CM | POA: Diagnosis not present

## 2017-05-04 DIAGNOSIS — I10 Essential (primary) hypertension: Secondary | ICD-10-CM | POA: Diagnosis not present

## 2017-05-04 DIAGNOSIS — E784 Other hyperlipidemia: Secondary | ICD-10-CM | POA: Diagnosis not present

## 2017-05-04 DIAGNOSIS — M1990 Unspecified osteoarthritis, unspecified site: Secondary | ICD-10-CM | POA: Diagnosis not present

## 2017-05-04 DIAGNOSIS — Z950 Presence of cardiac pacemaker: Secondary | ICD-10-CM | POA: Diagnosis not present

## 2017-05-04 DIAGNOSIS — I2510 Atherosclerotic heart disease of native coronary artery without angina pectoris: Secondary | ICD-10-CM | POA: Diagnosis not present

## 2017-05-04 DIAGNOSIS — N401 Enlarged prostate with lower urinary tract symptoms: Secondary | ICD-10-CM | POA: Diagnosis not present

## 2017-05-04 DIAGNOSIS — K648 Other hemorrhoids: Secondary | ICD-10-CM | POA: Diagnosis not present

## 2017-05-04 DIAGNOSIS — R413 Other amnesia: Secondary | ICD-10-CM | POA: Diagnosis not present

## 2017-05-04 DIAGNOSIS — K4090 Unilateral inguinal hernia, without obstruction or gangrene, not specified as recurrent: Secondary | ICD-10-CM | POA: Diagnosis not present

## 2017-07-02 ENCOUNTER — Other Ambulatory Visit: Payer: Self-pay | Admitting: Internal Medicine

## 2017-07-14 ENCOUNTER — Ambulatory Visit (INDEPENDENT_AMBULATORY_CARE_PROVIDER_SITE_OTHER): Payer: Medicare Other | Admitting: *Deleted

## 2017-07-14 DIAGNOSIS — I255 Ischemic cardiomyopathy: Secondary | ICD-10-CM | POA: Diagnosis not present

## 2017-07-14 NOTE — Progress Notes (Signed)
Remote ICD transmission.   

## 2017-07-15 ENCOUNTER — Encounter: Payer: Self-pay | Admitting: Cardiology

## 2017-07-16 LAB — CUP PACEART REMOTE DEVICE CHECK
Battery Remaining Longevity: 68 mo
Battery Voltage: 2.99 V
Brady Statistic AP VS Percent: 80.54 %
Brady Statistic RA Percent Paced: 81.59 %
Date Time Interrogation Session: 20180717041707
HIGH POWER IMPEDANCE MEASURED VALUE: 40 Ohm
HIGH POWER IMPEDANCE MEASURED VALUE: 58 Ohm
Implantable Lead Implant Date: 20011228
Implantable Lead Location: 753859
Implantable Lead Model: 6947
Lead Channel Impedance Value: 437 Ohm
Lead Channel Impedance Value: 494 Ohm
Lead Channel Pacing Threshold Amplitude: 0.75 V
Lead Channel Pacing Threshold Pulse Width: 0.4 ms
Lead Channel Pacing Threshold Pulse Width: 0.4 ms
Lead Channel Setting Sensing Sensitivity: 0.3 mV
MDC IDC LEAD IMPLANT DT: 19990520
MDC IDC LEAD LOCATION: 753860
MDC IDC MSMT LEADCHNL RA IMPEDANCE VALUE: 380 Ohm
MDC IDC MSMT LEADCHNL RA SENSING INTR AMPL: 1.125 mV
MDC IDC MSMT LEADCHNL RA SENSING INTR AMPL: 1.125 mV
MDC IDC MSMT LEADCHNL RV PACING THRESHOLD AMPLITUDE: 0.625 V
MDC IDC MSMT LEADCHNL RV SENSING INTR AMPL: 9.875 mV
MDC IDC MSMT LEADCHNL RV SENSING INTR AMPL: 9.875 mV
MDC IDC PG IMPLANT DT: 20140331
MDC IDC SET LEADCHNL RA PACING AMPLITUDE: 2 V
MDC IDC SET LEADCHNL RV PACING AMPLITUDE: 2.5 V
MDC IDC SET LEADCHNL RV PACING PULSEWIDTH: 0.4 ms
MDC IDC STAT BRADY AP VP PERCENT: 2.74 %
MDC IDC STAT BRADY AS VP PERCENT: 1.1 %
MDC IDC STAT BRADY AS VS PERCENT: 15.62 %
MDC IDC STAT BRADY RV PERCENT PACED: 3.81 %

## 2017-08-11 ENCOUNTER — Other Ambulatory Visit: Payer: Self-pay | Admitting: Cardiovascular Disease

## 2017-08-14 ENCOUNTER — Encounter (INDEPENDENT_AMBULATORY_CARE_PROVIDER_SITE_OTHER): Payer: Self-pay

## 2017-08-14 ENCOUNTER — Ambulatory Visit (INDEPENDENT_AMBULATORY_CARE_PROVIDER_SITE_OTHER): Payer: Medicare Other | Admitting: Cardiovascular Disease

## 2017-08-14 ENCOUNTER — Encounter: Payer: Self-pay | Admitting: Cardiovascular Disease

## 2017-08-14 ENCOUNTER — Telehealth: Payer: Self-pay | Admitting: Cardiovascular Disease

## 2017-08-14 VITALS — BP 108/60 | HR 80 | Ht 71.0 in | Wt 163.2 lb

## 2017-08-14 DIAGNOSIS — I2510 Atherosclerotic heart disease of native coronary artery without angina pectoris: Secondary | ICD-10-CM

## 2017-08-14 DIAGNOSIS — I5022 Chronic systolic (congestive) heart failure: Secondary | ICD-10-CM | POA: Diagnosis not present

## 2017-08-14 DIAGNOSIS — E782 Mixed hyperlipidemia: Secondary | ICD-10-CM

## 2017-08-14 DIAGNOSIS — I255 Ischemic cardiomyopathy: Secondary | ICD-10-CM

## 2017-08-14 DIAGNOSIS — I472 Ventricular tachycardia, unspecified: Secondary | ICD-10-CM

## 2017-08-14 MED ORDER — LISINOPRIL 5 MG PO TABS: 5.0000 mg | ORAL_TABLET | Freq: Every day | ORAL | 3 refills | Status: DC

## 2017-08-14 NOTE — Patient Instructions (Signed)
Medication Instructions:  Your physician recommends that you continue on your current medications as directed. Please refer to the Current Medication list given to you today. **Call Sharyn Lull to report correct dose of Amiodarone   Labwork: Your physician recommends that you return for lab work in: 6 months on the day of or a few days before your office visit with Dr. Acie Fredrickson.  You will need to FAST for this appointment - nothing to eat or drink after midnight the night before except water.   Testing/Procedures: Your physician has recommended that you have a pulmonary function test. Pulmonary Function Tests are a group of tests that measure how well air moves in and out of your lungs.   Follow-Up: Your physician wants you to follow-up in: 6 months with Dr. Acie Fredrickson.  You will receive a reminder letter in the mail two months in advance. If you don't receive a letter, please call our office to schedule the follow-up appointment.   If you need a refill on your cardiac medications before your next appointment, please call your pharmacy.   Thank you for choosing [facility]! Christen Bame, RN [phone]

## 2017-08-14 NOTE — Telephone Encounter (Signed)
No changes are needed per Dr. Acie Fredrickson

## 2017-08-14 NOTE — Telephone Encounter (Signed)
New message    Pt is calling to say he is taking 1/2 of a 100mg  tablet of amiodarone.
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
## 2017-08-18 ENCOUNTER — Ambulatory Visit (INDEPENDENT_AMBULATORY_CARE_PROVIDER_SITE_OTHER): Payer: Medicare Other | Admitting: Internal Medicine

## 2017-08-18 DIAGNOSIS — I2510 Atherosclerotic heart disease of native coronary artery without angina pectoris: Secondary | ICD-10-CM

## 2017-08-18 DIAGNOSIS — I5022 Chronic systolic (congestive) heart failure: Secondary | ICD-10-CM

## 2017-08-18 DIAGNOSIS — I472 Ventricular tachycardia, unspecified: Secondary | ICD-10-CM

## 2017-08-18 DIAGNOSIS — E782 Mixed hyperlipidemia: Secondary | ICD-10-CM

## 2017-08-18 LAB — PULMONARY FUNCTION TEST
DL/VA % PRED: 77 %
DL/VA: 3.32 ml/min/mmHg/L
DLCO UNC: 20.67 ml/min/mmHg
DLCO cor % pred: 78 %
DLCO cor: 20.97 ml/min/mmHg
DLCO unc % pred: 77 %
FEF 25-75 POST: 1.87 L/s
FEF 25-75 Pre: 2.02 L/sec
FEF2575-%CHANGE-POST: -7 %
FEF2575-%PRED-POST: 117 %
FEF2575-%Pred-Pre: 126 %
FEV1-%CHANGE-POST: -1 %
FEV1-%PRED-POST: 128 %
FEV1-%Pred-Pre: 130 %
FEV1-Post: 3 L
FEV1-Pre: 3.05 L
FEV1FVC-%Change-Post: 0 %
FEV1FVC-%Pred-Pre: 100 %
FEV6-%Change-Post: -1 %
FEV6-%PRED-POST: 131 %
FEV6-%PRED-PRE: 133 %
FEV6-POST: 4.07 L
FEV6-PRE: 4.12 L
FEV6FVC-%CHANGE-POST: 1 %
FEV6FVC-%PRED-POST: 107 %
FEV6FVC-%PRED-PRE: 106 %
FVC-%CHANGE-POST: -2 %
FVC-%PRED-POST: 123 %
FVC-%PRED-PRE: 126 %
FVC-POST: 4.1 L
FVC-PRE: 4.2 L
PRE FEV6/FVC RATIO: 98 %
Post FEV1/FVC ratio: 73 %
Post FEV6/FVC ratio: 99 %
Pre FEV1/FVC ratio: 73 %
RV % PRED: 82 %
RV: 1.99 L
TLC % pred: 97 %
TLC: 6.04 L

## 2017-08-18 NOTE — Progress Notes (Signed)
PFT done today. 

## 2017-09-17 ENCOUNTER — Other Ambulatory Visit: Payer: Self-pay | Admitting: Cardiovascular Disease

## 2017-09-17 MED ORDER — FENOFIBRATE 160 MG PO TABS: 160.0000 mg | ORAL_TABLET | Freq: Every day | ORAL | 3 refills | Status: DC

## 2017-10-20 ENCOUNTER — Encounter: Payer: Medicare Other | Admitting: *Deleted

## 2017-10-22 ENCOUNTER — Encounter: Payer: Self-pay | Admitting: Cardiology

## 2017-10-29 ENCOUNTER — Telehealth: Payer: Self-pay | Admitting: Internal Medicine

## 2017-10-29 NOTE — Telephone Encounter (Signed)
[name] calling in regards to letter. Transmission not received. He sent another transmission today- reports the light in the bullseye lit up. Possible delay in [facility] site. I will continue to look in [employer]link for transmission. He will send another if necessary.

## 2017-10-29 NOTE — Telephone Encounter (Signed)
Transmission not yet received. Unable to troubleshoot. Ordering new monitor via [employer]link. Pt appreciative.

## 2017-10-29 NOTE — Telephone Encounter (Signed)
New message    Patient calling because he received letter regarding transmission. He states he did send over transmission on 10/23. Please call.   1. Has your device fired? NO 2. Is you device beeping? NO  3. Are you experiencing draining or swelling at device site? NO  4. Are you calling to see if we received your device transmission? YES  5. Have you passed out? NO    Please route to [place]

## 2017-11-02 DIAGNOSIS — Z125 Encounter for screening for malignant neoplasm of prostate: Secondary | ICD-10-CM | POA: Diagnosis not present

## 2017-11-02 DIAGNOSIS — I10 Essential (primary) hypertension: Secondary | ICD-10-CM | POA: Diagnosis not present

## 2017-11-02 DIAGNOSIS — R82998 Other abnormal findings in urine: Secondary | ICD-10-CM | POA: Diagnosis not present

## 2017-11-02 DIAGNOSIS — E7849 Other hyperlipidemia: Secondary | ICD-10-CM | POA: Diagnosis not present

## 2017-11-09 DIAGNOSIS — I10 Essential (primary) hypertension: Secondary | ICD-10-CM | POA: Diagnosis not present

## 2017-11-09 DIAGNOSIS — Z950 Presence of cardiac pacemaker: Secondary | ICD-10-CM | POA: Diagnosis not present

## 2017-11-09 DIAGNOSIS — K4090 Unilateral inguinal hernia, without obstruction or gangrene, not specified as recurrent: Secondary | ICD-10-CM | POA: Diagnosis not present

## 2017-11-09 DIAGNOSIS — N401 Enlarged prostate with lower urinary tract symptoms: Secondary | ICD-10-CM | POA: Diagnosis not present

## 2017-11-09 DIAGNOSIS — I499 Cardiac arrhythmia, unspecified: Secondary | ICD-10-CM | POA: Diagnosis not present

## 2017-11-09 DIAGNOSIS — E7849 Other hyperlipidemia: Secondary | ICD-10-CM | POA: Diagnosis not present

## 2017-11-09 DIAGNOSIS — Z6824 Body mass index (BMI) 24.0-24.9, adult: Secondary | ICD-10-CM | POA: Diagnosis not present

## 2017-11-09 DIAGNOSIS — R413 Other amnesia: Secondary | ICD-10-CM | POA: Diagnosis not present

## 2017-11-09 DIAGNOSIS — Z0000 Encounter for general adult medical examination without abnormal findings: Secondary | ICD-10-CM | POA: Diagnosis not present

## 2017-11-09 DIAGNOSIS — I2510 Atherosclerotic heart disease of native coronary artery without angina pectoris: Secondary | ICD-10-CM | POA: Diagnosis not present

## 2017-11-09 DIAGNOSIS — M1990 Unspecified osteoarthritis, unspecified site: Secondary | ICD-10-CM | POA: Diagnosis not present

## 2017-11-24 DIAGNOSIS — Z1212 Encounter for screening for malignant neoplasm of rectum: Secondary | ICD-10-CM | POA: Diagnosis not present

## 2017-11-30 ENCOUNTER — Other Ambulatory Visit: Payer: Self-pay

## 2017-11-30 MED ORDER — AMIODARONE HCL 100 MG PO TABS: 50.0000 mg | ORAL_TABLET | Freq: Every day | ORAL | 2 refills | Status: DC

## 2017-11-30 MED ORDER — SPIRONOLACTONE 25 MG PO TABS: 25.0000 mg | ORAL_TABLET | Freq: Every day | ORAL | 2 refills | Status: DC

## 2017-12-21 ENCOUNTER — Inpatient Hospital Stay (HOSPITAL_BASED_OUTPATIENT_CLINIC_OR_DEPARTMENT_OTHER)
Admission: EM | Admit: 2017-12-21 | Discharge: 2017-12-30 | DRG: 854 | Disposition: A | Discharge status: Home Health | Payer: Medicare Other | Attending: Internal Medicine | Admitting: Internal Medicine

## 2017-12-21 ENCOUNTER — Encounter (HOSPITAL_BASED_OUTPATIENT_CLINIC_OR_DEPARTMENT_OTHER): Payer: Self-pay | Admitting: Emergency Medicine

## 2017-12-21 ENCOUNTER — Emergency Department (HOSPITAL_BASED_OUTPATIENT_CLINIC_OR_DEPARTMENT_OTHER): Payer: Medicare Other

## 2017-12-21 ENCOUNTER — Other Ambulatory Visit: Payer: Self-pay

## 2017-12-21 DIAGNOSIS — I2510 Atherosclerotic heart disease of native coronary artery without angina pectoris: Secondary | ICD-10-CM | POA: Diagnosis present

## 2017-12-21 DIAGNOSIS — K8001 Calculus of gallbladder with acute cholecystitis with obstruction: Secondary | ICD-10-CM | POA: Diagnosis not present

## 2017-12-21 DIAGNOSIS — I252 Old myocardial infarction: Secondary | ICD-10-CM

## 2017-12-21 DIAGNOSIS — K810 Acute cholecystitis: Secondary | ICD-10-CM | POA: Diagnosis not present

## 2017-12-21 DIAGNOSIS — Z7982 Long term (current) use of aspirin: Secondary | ICD-10-CM | POA: Diagnosis not present

## 2017-12-21 DIAGNOSIS — Z79890 Hormone replacement therapy (postmenopausal): Secondary | ICD-10-CM | POA: Diagnosis not present

## 2017-12-21 DIAGNOSIS — N179 Acute kidney failure, unspecified: Secondary | ICD-10-CM | POA: Diagnosis present

## 2017-12-21 DIAGNOSIS — I5022 Chronic systolic (congestive) heart failure: Secondary | ICD-10-CM | POA: Diagnosis present

## 2017-12-21 DIAGNOSIS — E039 Hypothyroidism, unspecified: Secondary | ICD-10-CM | POA: Diagnosis not present

## 2017-12-21 DIAGNOSIS — R1084 Generalized abdominal pain: Secondary | ICD-10-CM | POA: Diagnosis not present

## 2017-12-21 DIAGNOSIS — A419 Sepsis, unspecified organism: Principal | ICD-10-CM

## 2017-12-21 DIAGNOSIS — I110 Hypertensive heart disease with heart failure: Secondary | ICD-10-CM | POA: Diagnosis not present

## 2017-12-21 DIAGNOSIS — I480 Paroxysmal atrial fibrillation: Secondary | ICD-10-CM

## 2017-12-21 DIAGNOSIS — Z95810 Presence of automatic (implantable) cardiac defibrillator: Secondary | ICD-10-CM | POA: Diagnosis not present

## 2017-12-21 DIAGNOSIS — R0902 Hypoxemia: Secondary | ICD-10-CM | POA: Diagnosis not present

## 2017-12-21 DIAGNOSIS — K8020 Calculus of gallbladder without cholecystitis without obstruction: Secondary | ICD-10-CM | POA: Diagnosis not present

## 2017-12-21 DIAGNOSIS — I700 Atherosclerosis of aorta: Secondary | ICD-10-CM | POA: Diagnosis present

## 2017-12-21 DIAGNOSIS — Z79899 Other long term (current) drug therapy: Secondary | ICD-10-CM | POA: Diagnosis not present

## 2017-12-21 DIAGNOSIS — Z951 Presence of aortocoronary bypass graft: Secondary | ICD-10-CM

## 2017-12-21 DIAGNOSIS — Z8249 Family history of ischemic heart disease and other diseases of the circulatory system: Secondary | ICD-10-CM | POA: Diagnosis not present

## 2017-12-21 DIAGNOSIS — F0390 Unspecified dementia without behavioral disturbance: Secondary | ICD-10-CM | POA: Diagnosis not present

## 2017-12-21 DIAGNOSIS — I498 Other specified cardiac arrhythmias: Secondary | ICD-10-CM | POA: Diagnosis present

## 2017-12-21 DIAGNOSIS — K8050 Calculus of bile duct without cholangitis or cholecystitis without obstruction: Secondary | ICD-10-CM | POA: Diagnosis not present

## 2017-12-21 DIAGNOSIS — Z87891 Personal history of nicotine dependence: Secondary | ICD-10-CM

## 2017-12-21 DIAGNOSIS — R0602 Shortness of breath: Secondary | ICD-10-CM | POA: Diagnosis not present

## 2017-12-21 DIAGNOSIS — R0600 Dyspnea, unspecified: Secondary | ICD-10-CM

## 2017-12-21 DIAGNOSIS — K5730 Diverticulosis of large intestine without perforation or abscess without bleeding: Secondary | ICD-10-CM | POA: Diagnosis not present

## 2017-12-21 DIAGNOSIS — I472 Ventricular tachycardia: Secondary | ICD-10-CM | POA: Diagnosis present

## 2017-12-21 DIAGNOSIS — I255 Ischemic cardiomyopathy: Secondary | ICD-10-CM | POA: Diagnosis not present

## 2017-12-21 DIAGNOSIS — K8000 Calculus of gallbladder with acute cholecystitis without obstruction: Secondary | ICD-10-CM | POA: Diagnosis present

## 2017-12-21 DIAGNOSIS — I4892 Unspecified atrial flutter: Secondary | ICD-10-CM | POA: Diagnosis not present

## 2017-12-21 DIAGNOSIS — I5042 Chronic combined systolic (congestive) and diastolic (congestive) heart failure: Secondary | ICD-10-CM | POA: Diagnosis not present

## 2017-12-21 DIAGNOSIS — I5030 Unspecified diastolic (congestive) heart failure: Secondary | ICD-10-CM | POA: Diagnosis not present

## 2017-12-21 DIAGNOSIS — I959 Hypotension, unspecified: Secondary | ICD-10-CM | POA: Diagnosis present

## 2017-12-21 DIAGNOSIS — R109 Unspecified abdominal pain: Secondary | ICD-10-CM | POA: Diagnosis not present

## 2017-12-21 DIAGNOSIS — N400 Enlarged prostate without lower urinary tract symptoms: Secondary | ICD-10-CM | POA: Diagnosis not present

## 2017-12-21 LAB — DIFFERENTIAL
BASOS PCT: 0 %
Basophils Absolute: 0 10*3/uL (ref 0.0–0.1)
Eosinophils Absolute: 0.1 10*3/uL (ref 0.0–0.7)
Eosinophils Relative: 1 %
LYMPHS ABS: 1.3 10*3/uL (ref 0.7–4.0)
Lymphocytes Relative: 13 %
MONO ABS: 0.7 10*3/uL (ref 0.1–1.0)
MONOS PCT: 7 %
NEUTROS ABS: 7.9 10*3/uL — AB (ref 1.7–7.7)
Neutrophils Relative %: 79 %

## 2017-12-21 LAB — URINALYSIS, ROUTINE W REFLEX MICROSCOPIC
Bilirubin Urine: NEGATIVE
GLUCOSE, UA: NEGATIVE mg/dL
HGB URINE DIPSTICK: NEGATIVE
Ketones, ur: NEGATIVE mg/dL
Leukocytes, UA: NEGATIVE
Nitrite: NEGATIVE
Protein, ur: NEGATIVE mg/dL
SPECIFIC GRAVITY, URINE: 1.01 (ref 1.005–1.030)
pH: 6.5 (ref 5.0–8.0)

## 2017-12-21 LAB — COMPREHENSIVE METABOLIC PANEL
ALT: 19 U/L (ref 17–63)
AST: 33 U/L (ref 15–41)
Albumin: 3.7 g/dL (ref 3.5–5.0)
Alkaline Phosphatase: 29 U/L — ABNORMAL LOW (ref 38–126)
Anion gap: 8 (ref 5–15)
BUN: 17 mg/dL (ref 6–20)
CHLORIDE: 104 mmol/L (ref 101–111)
CO2: 25 mmol/L (ref 22–32)
CREATININE: 1.14 mg/dL (ref 0.61–1.24)
Calcium: 9 mg/dL (ref 8.9–10.3)
GFR calc Af Amer: >60 mL/min (ref >60)
GFR calc non Af Amer: 59 mL/min — ABNORMAL LOW (ref >60)
Glucose, Bld: 133 mg/dL — ABNORMAL HIGH (ref 65–99)
Potassium: 4.3 mmol/L (ref 3.5–5.1)
Sodium: 137 mmol/L (ref 135–145)
Total Bilirubin: 0.7 mg/dL (ref 0.3–1.2)
Total Protein: 6.5 g/dL (ref 6.5–8.1)

## 2017-12-21 LAB — CBC
HEMATOCRIT: 42.3 % (ref 39.0–52.0)
HEMOGLOBIN: 14.5 g/dL (ref 13.0–17.0)
MCH: 31.4 pg (ref 26.0–34.0)
MCHC: 34.3 g/dL (ref 30.0–36.0)
MCV: 91.6 fL (ref 78.0–100.0)
Platelets: 169 10*3/uL (ref 150–400)
RBC: 4.62 MIL/uL (ref 4.22–5.81)
RDW: 13.5 % (ref 11.5–15.5)
WBC: 9.9 10*3/uL (ref 4.0–10.5)

## 2017-12-21 LAB — LIPASE, BLOOD: Lipase: 25 U/L (ref 11–51)

## 2017-12-21 MED ORDER — ONDANSETRON HCL 4 MG/2ML IJ SOLN
4.0000 mg | Freq: Once | INTRAMUSCULAR | Status: AC
  Administered 2017-12-21: 4 mg via INTRAVENOUS
  Filled 2017-12-21: qty 2

## 2017-12-21 MED ORDER — TAMSULOSIN HCL 0.4 MG PO CAPS
0.4000 mg | ORAL_CAPSULE | Freq: Every day | ORAL | Status: DC
  Administered 2017-12-22: 0.4 mg via ORAL
  Filled 2017-12-21: qty 1

## 2017-12-21 MED ORDER — IOPAMIDOL (ISOVUE-300) INJECTION 61%
100.0000 mL | Freq: Once | INTRAVENOUS | Status: AC | PRN
  Administered 2017-12-21: 100 mL via INTRAVENOUS

## 2017-12-21 MED ORDER — MORPHINE SULFATE (PF) 4 MG/ML IV SOLN
4.0000 mg | Freq: Once | INTRAVENOUS | Status: AC
  Administered 2017-12-21: 4 mg via INTRAVENOUS
  Filled 2017-12-21: qty 1

## 2017-12-21 MED ORDER — FENOFIBRATE 160 MG PO TABS
160.0000 mg | ORAL_TABLET | Freq: Every day | ORAL | Status: DC
Start: 2017-12-22 — End: 2017-12-30
  Administered 2017-12-22 – 2017-12-30 (×8): 160 mg via ORAL
  Filled 2017-12-21 (×9): qty 1

## 2017-12-21 MED ORDER — CARVEDILOL 6.25 MG PO TABS: 6.2500 mg | ORAL_TABLET | Freq: Two times a day (BID) | ORAL | Status: DC

## 2017-12-21 MED ORDER — PIPERACILLIN-TAZOBACTAM 3.375 G IVPB
3.3750 g | Freq: Three times a day (TID) | INTRAVENOUS | Status: DC
  Administered 2017-12-22 – 2017-12-27 (×16): 3.375 g via INTRAVENOUS
  Filled 2017-12-21 (×19): qty 50

## 2017-12-21 MED ORDER — ACETAMINOPHEN 650 MG RE SUPP: 650.0000 mg | Freq: Four times a day (QID) | RECTAL | Status: DC | PRN

## 2017-12-21 MED ORDER — ONDANSETRON HCL 4 MG/2ML IJ SOLN: 4.0000 mg | Freq: Four times a day (QID) | INTRAMUSCULAR | Status: DC | PRN

## 2017-12-21 MED ORDER — ACETAMINOPHEN 325 MG PO TABS
650.0000 mg | ORAL_TABLET | Freq: Four times a day (QID) | ORAL | Status: DC | PRN
  Administered 2017-12-22 – 2017-12-27 (×3): 650 mg via ORAL
  Filled 2017-12-21 (×3): qty 2

## 2017-12-21 MED ORDER — NITROGLYCERIN 0.4 MG SL SUBL: 0.4000 mg | SUBLINGUAL_TABLET | SUBLINGUAL | Status: DC | PRN

## 2017-12-21 MED ORDER — DONEPEZIL HCL 5 MG PO TABS
5.0000 mg | ORAL_TABLET | Freq: Every day | ORAL | Status: DC
Start: 2017-12-22 — End: 2017-12-30
  Administered 2017-12-22 – 2017-12-30 (×8): 5 mg via ORAL
  Filled 2017-12-21 (×8): qty 1

## 2017-12-21 MED ORDER — FINASTERIDE 5 MG PO TABS
5.0000 mg | ORAL_TABLET | Freq: Every day | ORAL | Status: DC
Start: 2017-12-22 — End: 2017-12-30
  Administered 2017-12-22 – 2017-12-30 (×8): 5 mg via ORAL
  Filled 2017-12-21 (×8): qty 1

## 2017-12-21 MED ORDER — ONDANSETRON HCL 4 MG PO TABS
4.0000 mg | ORAL_TABLET | Freq: Four times a day (QID) | ORAL | Status: DC | PRN
Start: 2017-12-21 — End: 2017-12-30

## 2017-12-21 MED ORDER — LEVOTHYROXINE SODIUM 75 MCG PO TABS
75.0000 ug | ORAL_TABLET | Freq: Every day | ORAL | Status: DC
  Administered 2017-12-22 – 2017-12-30 (×9): 75 ug via ORAL
  Filled 2017-12-21 (×9): qty 1

## 2017-12-21 MED ORDER — LISINOPRIL 5 MG PO TABS: 5.0000 mg | ORAL_TABLET | Freq: Every day | ORAL | Status: DC

## 2017-12-21 MED ORDER — AMIODARONE HCL 100 MG PO TABS
50.0000 mg | ORAL_TABLET | Freq: Every day | ORAL | Status: DC
  Administered 2017-12-22 – 2017-12-25 (×4): 50 mg via ORAL
  Filled 2017-12-21 (×5): qty 1

## 2017-12-21 NOTE — ED Notes (Signed)
Attempted IV x 2 , to right arm, tol well. Blood obtained for labs

## 2017-12-21 NOTE — ED Notes (Signed)
ED Provider at bedside. 

## 2017-12-21 NOTE — ED Notes (Signed)
Returned to room.

## 2017-12-21 NOTE — ED Notes (Signed)
Family at bedside. 

## 2017-12-21 NOTE — ED Notes (Signed)
Patient is resting comfortably. 

## 2017-12-21 NOTE — ED Notes (Signed)
Paged hospitalist for Dr Oleta Mouse @ 13:34 for consult/admit to [facility]

## 2017-12-21 NOTE — ED Provider Notes (Signed)
[facility] EMERGENCY DEPARTMENT Provider Note   CSN: [id number] Arrival date & time: 12/21/17  0805     History   Chief Complaint Chief Complaint  Patient presents with  . Abdominal Pain    HPI [name] is a 83 y.o. male.  HPI 82 year old male who presents with generalized abdominal pain. History of inguinal hernia repair, CAD s/p CABG, ICMP EF 15-20% s/p AICD for VT storm, atrial fibrillation/tachycardia (no anticoagulation).  He reports being in his usual state of health.  States that yesterday evening developed generalized aching abdominal pain. Rated 9/10 in severity. Pain has been constant and unremitting.  Denies any fever, chills, nausea or vomiting, diarrhea, constipation, dysuria or urinary frequency, hematuria or back pain.  He has not had similar symptoms in the past.  Has not taking any medications prior to arrival.  Pain is worse with palpation.    Past Medical History:  Diagnosis Date  . Atrial arrhythmia/a fibrillation    not on coumadin 2/2 subdural hematoma  . Automatic implantable cardiac defibrillator in situ    medtronic  . Automatic implantable cardioverter-defibrillator in situ   . CHF (congestive heart failure) ([place])   . Chronic systolic congestive heart failure ([place])   . Dysrhythmia   . Encounter for long-term (current) use of other medications    amiodarone  . H/O hiatal hernia   . Hyperlipidemia   . Hypertension   . Hypothyroidism   . Ischemic cardiomyopathy    status post CABG with patent vein grafts and an atretic LIMA to his ramus, January 2012  . Myocardial infarction [facility])    1995, non-STEMI January 2012  . Pacemaker   . Subdural hematoma (H[facility])    on coumadin  . Thyroid disease    hypothyroid  . Ventricular fibrillation ([place])   . Ventricular tachycardia ([place])     VT Storm-January 2012- responded to Amiodarone    Patient Active Problem List   Diagnosis Date Noted  . Chest pain 05/19/2016  . CAD (coronary artery  disease) 01/23/2016  . Dyslipidemia 07/26/2014  . S/P inguinal hernia repair 05/15/2014  . Left inguinal hernia 04/05/2014  . Atrial oversensing on his defibrillator 12/09/2013  . Palpitations 12/09/2013  . Weight loss 07/28/2013  . hypertransaminasemia 04/01/2011  . Ischemic cardiomyopathy   . Ventricular tachycardia ([place])   . Ventricular fibrillation ([place])   . Chronic systolic CHF (congestive heart failure) ([place])   . Encounter for long-term (current) use of other medications   . Atrial arrhythmia/a fibrillation   . ICD (implantable cardioverter-defibrillator) in place   . Hyperlipidemia LDL goal <70 03/19/2010    Past Surgical History:  Procedure Laterality Date  . bilateral cranitomies for sub hematomas    . BYPASS GRAFT  1995   5  . CORONARY ARTERY BYPASS GRAFT  1995   5  . IMPLANTABLE CARDIOVERTER DEFIBRILLATOR GENERATOR CHANGE N/A 03/28/2013   Procedure: IMPLANTABLE CARDIOVERTER DEFIBRILLATOR GENERATOR CHANGE;  Surgeon: Deboraha Sprang, MD;  Location: [facility] CATH LAB;  Service: Cardiovascular;  Laterality: N/A;  . INGUINAL HERNIA REPAIR Left 05/15/2014   Procedure: HERNIA REPAIR INGUINAL ADULT;  Surgeon: Zenovia Jarred, MD;  Location: [place];  Service: General;  Laterality: Left;  . INSERT / REPLACE / REMOVE PACEMAKER  14    generator repl   . INSERTION OF MESH Left 05/15/2014   Procedure: INSERTION OF MESH;  Surgeon: Zenovia Jarred, MD;  Location: [place];  Service: General;  Laterality: Left;  .  TONSILLECTOMY         Home Medications    Prior to Admission medications   Medication Sig Start Date End Date Taking? Authorizing Provider  amiodarone (PACERONE) 100 MG tablet Take 0.5 tablets (50 mg total) by mouth daily. 11/30/17  Yes Deboraha Sprang, MD  aspirin 81 MG tablet Take 81 mg by mouth daily.     Yes [provider]  carvedilol (COREG) 6.25 MG tablet TAKE 1 TABLET BY MOUTH 2 TIMES DAILY WITH A MEAL. 07/02/17  Yes Deboraha Sprang, MD  donepezil (ARICEPT) 5 MG  tablet Take 5 mg by mouth at bedtime.   Yes [provider]  fenofibrate 160 MG tablet Take 1 tablet (160 mg total) by mouth daily. 09/17/17  Yes Nahser, Wonda Cheng, MD  finasteride (PROSCAR) 5 MG tablet Take 5 mg by mouth daily. 04/01/16  Yes [provider]  levothyroxine (SYNTHROID, LEVOTHROID) 50 MCG tablet Take 50 mcg by mouth daily.     Yes [provider]  lisinopril (PRINIVIL,ZESTRIL) 5 MG tablet Take 1 tablet (5 mg total) by mouth daily. 08/14/17  Yes Nahser, Wonda Cheng, MD  Multiple Vitamin (MULTIVITAMIN) tablet Take 1 tablet by mouth daily.   Yes [provider]  niacin (SLO-NIACIN) 500 MG tablet Take 1,000-2,000 mg by mouth daily. Alternates with 1000 mg one day and 2000 mg the next   Yes [provider]  nitroGLYCERIN (NITROSTAT) 0.4 MG SL tablet Place 1 tablet (0.4 mg total) under the tongue every 5 (five) minutes as needed for chest pain. For chest pain x 3 doses 03/22/13  Yes Deboraha Sprang, MD  spironolactone (ALDACTONE) 25 MG tablet Take 1 tablet (25 mg total) by mouth daily. 11/30/17  Yes Nahser, Wonda Cheng, MD  Tamsulosin HCl (FLOMAX) 0.4 MG CAPS Take 0.4 mg by mouth daily.  03/31/11  Yes [provider]    Family History Family History  Problem Relation Age of Onset  . Cerebral aneurysm Father   . Hypertension Father     Social History Social History   Tobacco Use  . Smoking status: Never Smoker  . Smokeless tobacco: Former Network engineer Use Topics  . Alcohol use: No  . Drug use: No     Allergies   Patient has no known allergies.   Review of Systems Review of Systems  Constitutional: Negative for fever.  Respiratory: Negative for shortness of breath.   Cardiovascular: Negative for chest pain.  Gastrointestinal: Positive for abdominal pain. Negative for constipation, diarrhea, nausea and vomiting.  Genitourinary: Negative for dysuria and frequency.  All other systems reviewed and are negative.    Physical  Exam Updated Vital Signs BP 117/72 (BP Location: Left Arm)   Pulse (!) 59   Temp 98.3 F (36.8 C) (Oral)   Resp 12   Ht 5\' 11"  (1.803 m)   Wt 72.6 kg (160 lb)   SpO2 97%   BMI 22.32 kg/m   Physical Exam Physical Exam  Nursing note and vitals reviewed. Constitutional: Well developed, well nourished, non-toxic, and in no acute distress Head: Normocephalic and atraumatic.  Mouth/Throat: Oropharynx is clear and moist.  Neck: Normal range of motion. Neck supple.  Cardiovascular: Normal rate and regular rhythm.   Pulmonary/Chest: Effort normal and breath sounds normal.  Abdominal: Soft. There is generalized abdominal tenderness. There is no rebound and no guarding.  Musculoskeletal: Normal range of motion.  Neurological: Alert, no facial droop, fluent speech, moves all extremities symmetrically Skin: Skin is warm  and dry.  Psychiatric: Cooperative   ED Treatments / Results  Labs (all labs ordered are listed, but only abnormal results are displayed) Labs Reviewed  COMPREHENSIVE METABOLIC PANEL - Abnormal; Notable for the following components:      Result Value   Glucose, Bld 133 (*)    Alkaline Phosphatase 29 (*)    GFR calc non Af Amer 59 (*)    All other components within normal limits  DIFFERENTIAL - Abnormal; Notable for the following components:   Neutro Abs 7.9 (*)    All other components within normal limits  LIPASE, BLOOD  URINALYSIS, ROUTINE W REFLEX MICROSCOPIC  CBC  CBC WITH DIFFERENTIAL/PLATELET    EKG  EKG Interpretation  Date/Time:  Monday December 21 2017 08:30:30 EST Ventricular Rate:  60 PR Interval:    QRS Duration: 174 QT Interval:  489 QTC Calculation: 489 R Axis:   -74 Text Interpretation:  Atrial-paced rhythm RBBB and LAFB Similar to previous EKG  Confirmed by Brantley Stage [id number]) on 12/21/2017 8:48:13 AM       Radiology Ct Abdomen Pelvis W Contrast  Result Date: 12/21/2017 CLINICAL DATA:  Abdominal distension since last night. Hernia  repair. EXAM: CT ABDOMEN AND PELVIS WITH CONTRAST TECHNIQUE: Multidetector CT imaging of the abdomen and pelvis was performed using the standard protocol following bolus administration of intravenous contrast. CONTRAST:  186mL ISOVUE-300 IOPAMIDOL (ISOVUE-300) INJECTION 61% COMPARISON:  None. FINDINGS: Lower chest: There is atelectasis in the lung bases with no suspicious infiltrates, nodules, or masses. Cardiomegaly is identified. Pacemaker leads are seen. Hepatobiliary: The left lobe of the liver is relatively atrophic. No hepatic masses are seen. The portal vein is patent. The gallbladder is distended. There is mild increased attenuation in the fat along the undersurface of the gallbladder best seen on coronal images. Cholelithiasis is noted. There is suggestion of stones in the cystic duct on coronal imaging. No common bile duct stones are seen on this study. However, there is mild prominence of the intrahepatic bile ducts on today's study. Pancreas: Unremarkable. No pancreatic ductal dilatation or surrounding inflammatory changes. Spleen: Normal in size without focal abnormality. Adrenals/Urinary Tract: Adrenal glands are normal. There is a cyst in the right kidney. No hydronephrosis or suspicious masses. No ureterectasis or ureteral stones. The bladder is normal. Stomach/Bowel: The stomach is normal. There is a duodenal diverticulum without evidence of inflammation. The small bowel is otherwise normal. Diffuse colonic diverticulosis is seen without focal diverticulitis. The appendix is well seen with no evidence of appendicitis. Vascular/Lymphatic: Atherosclerotic change is seen in the nonaneurysmal aorta, iliac vessels, and femoral vessels. No adenopathy. Reproductive: The prostate is enlarged. Other: No free air or free fluid. Mild increased attenuation in the fat of the abdomen may be due to volume overload. Musculoskeletal: No acute or significant osseous findings. IMPRESSION: 1. The gallbladder is  distended and contains numerous stones. There is suggestion of stones in the cystic duct. There is mild fat stranding along the inferior surface of the gallbladder on coronal images. The findings are concerning for acute cholecystitis. Mild intrahepatic ductal prominence. Recommend clinical correlation and ultrasound. 2. Atherosclerotic change in the nonaneurysmal aorta. 3. Diverticulosis without focal diverticulitis. Electronically Signed   By: Dorise Bullion III M.D   On: 12/21/2017 09:59   US Abdomen Limited Ruq  Result Date: 12/21/2017 CLINICAL DATA:  Biliary colic. EXAM: ULTRASOUND ABDOMEN LIMITED RIGHT UPPER QUADRANT COMPARISON:  CT scan of same day. FINDINGS: Gallbladder: Cholelithiasis is noted with largest calculus measuring 2 cm. No  significant gallbladder wall thickening or pericholecystic fluid is noted. No sonographic Murphy's sign is noted. Common bile duct: Diameter: 5.5 mm which is within normal limits. Liver: No focal lesion identified. Within normal limits in parenchymal echogenicity. Portal vein is patent on color Doppler imaging with normal direction of blood flow towards the liver. IMPRESSION: Cholelithiasis is noted without definite evidence of cholecystitis. If there is clinical concern for cholecystitis, HIDA scan is recommended for further evaluation. Electronically Signed   By: Marijo Conception, M.D.   On: 12/21/2017 12:18    Procedures Procedures (including critical care time)  Medications Ordered in ED Medications  ondansetron (ZOFRAN) injection 4 mg (4 mg Intravenous Given 12/21/17 0857)  morphine 4 MG/ML injection 4 mg (4 mg Intravenous Given 12/21/17 0857)  iopamidol (ISOVUE-300) 61 % injection 100 mL (100 mLs Intravenous Contrast Given 12/21/17 0931)  morphine 4 MG/ML injection 4 mg (4 mg Intravenous Given 12/21/17 1113)  morphine 4 MG/ML injection 4 mg (4 mg Intravenous Given 12/21/17 1339)     Initial Impression / Assessment and Plan / ED Course  I have  reviewed the triage vital signs and the nursing notes.  Pertinent labs & imaging results that were available during my care of the patient were reviewed by me and considered in my medical decision making (see chart for details).     Presents with abdominal pain after very heavy male.  Is nontoxic in no acute distress with stable vital signs and is afebrile.  Soft and non-peritoneal abdomen.  Initially pain was diffuse, but after more pain control was more localized to the right upper quadrant.  Initial CT scan was performed showing distended gallbladder with gallstones, possible cystic stone, and possible mild inflammation.  A follow-up right upper quadrant ultrasound was performed due to persistent pain.  There is no evidence of cholecystitis.  No major biliary duct dilatation.  Normal LFTs, lipase and T bili.  No leukocytosis.  Did receive multiple doses of morphine with persistent upper abdominal pain.  Concern for persistent biliary colic versus subclinical cholecystitis.  Discussed with Leighton Ruff from general surgery who agrees with HIDA scan.  Given persistent pain I have spoken with hospitalist service at [facility].  Will be admitted for HIDA scan and pain control.  Accepted by Dr. Lonny Prude.  Final Clinical Impressions(s) / ED Diagnoses   Final diagnoses:  Biliary colic    ED Discharge Orders    None       Forde Dandy, MD 12/21/17 1346

## 2017-12-21 NOTE — ED Notes (Signed)
Awaiting US.

## 2017-12-21 NOTE — Plan of Care (Signed)
82 year old male with medical history significant for CABG (1995), CHF status post ICD, ventricular tachycardia on amiodarone, hypothyroidism presented to the ED with 1 day of periumbilical abdominal pain following a meal at a restaurant.  Upon presentation to the ED patient was afebrile hemodynamically stable with non-peritoneal abdominal exam.  Right upper quadrant pain evaluated by CT abdomen showed distended gallbladder, numerous gallstones, and concern for acute cholecystitis.Abdominal ultrasound showed cholelithiasis but no evidence of cholecystitis with HIDA recommended.   ED physician spoke with general surgery who agreed patient need HIDA scan. Given patient's significant cardiac history surgery is needed will also need cardiac clearance.  Patient is accepted as a transfer from [facility] to [facility].  [facility]

## 2017-12-21 NOTE — ED Notes (Signed)
Patient transported to CT 

## 2017-12-21 NOTE — ED Notes (Signed)
Patient transported to Ultrasound 

## 2017-12-21 NOTE — ED Triage Notes (Signed)
Lower abd pain during the night, aching pain. Denies n/v or diarrhea. Last BM yesterday. Per wife ate a lot of nuts and then had a heavy dinner. No prior hx of 'stomach" pain. Denies lower back pain or urinary s/s.

## 2017-12-21 NOTE — Progress Notes (Signed)
Arrived to room 6n24 from [place] [facility]. A/Ox4, denies pain/nausea  At this time, oriented to room and surroundings.

## 2017-12-21 NOTE — Progress Notes (Signed)
Dr. Lisbeth Ply notified of arrival to 50n24.

## 2017-12-22 ENCOUNTER — Inpatient Hospital Stay (HOSPITAL_COMMUNITY): Payer: Medicare Other

## 2017-12-22 DIAGNOSIS — I5030 Unspecified diastolic (congestive) heart failure: Secondary | ICD-10-CM

## 2017-12-22 DIAGNOSIS — K8050 Calculus of bile duct without cholangitis or cholecystitis without obstruction: Secondary | ICD-10-CM

## 2017-12-22 LAB — HEPATIC FUNCTION PANEL
ALK PHOS: 24 U/L — AB (ref 38–126)
ALT: 32 U/L (ref 17–63)
AST: 47 U/L — AB (ref 15–41)
Albumin: 3.3 g/dL — ABNORMAL LOW (ref 3.5–5.0)
Bilirubin, Direct: 0.4 mg/dL (ref 0.1–0.5)
Indirect Bilirubin: 1.3 mg/dL — ABNORMAL HIGH (ref 0.3–0.9)
TOTAL PROTEIN: 6.1 g/dL — AB (ref 6.5–8.1)
Total Bilirubin: 1.7 mg/dL — ABNORMAL HIGH (ref 0.3–1.2)

## 2017-12-22 LAB — BASIC METABOLIC PANEL
ANION GAP: 12 (ref 5–15)
BUN: 21 mg/dL — ABNORMAL HIGH (ref 6–20)
CALCIUM: 8.7 mg/dL — AB (ref 8.9–10.3)
CHLORIDE: 104 mmol/L (ref 101–111)
CO2: 20 mmol/L — AB (ref 22–32)
CREATININE: 2.54 mg/dL — AB (ref 0.61–1.24)
GFR calc non Af Amer: 22 mL/min — ABNORMAL LOW (ref >60)
GFR, EST AFRICAN AMERICAN: 26 mL/min — AB (ref >60)
Glucose, Bld: 123 mg/dL — ABNORMAL HIGH (ref 65–99)
Potassium: 4 mmol/L (ref 3.5–5.1)
SODIUM: 136 mmol/L (ref 135–145)

## 2017-12-22 LAB — LACTIC ACID, PLASMA: Lactic Acid, Venous: 2.6 mmol/L (ref 0.5–1.9)

## 2017-12-22 LAB — CBC
HCT: 44.7 % (ref 39.0–52.0)
HEMOGLOBIN: 15.1 g/dL (ref 13.0–17.0)
MCH: 31.9 pg (ref 26.0–34.0)
MCHC: 33.8 g/dL (ref 30.0–36.0)
MCV: 94.3 fL (ref 78.0–100.0)
PLATELETS: 161 10*3/uL (ref 150–400)
RBC: 4.74 MIL/uL (ref 4.22–5.81)
RDW: 14.1 % (ref 11.5–15.5)
WBC: 23.2 10*3/uL — AB (ref 4.0–10.5)

## 2017-12-22 LAB — ECHOCARDIOGRAM COMPLETE
HEIGHTINCHES: 71 in
WEIGHTICAEL: 3312.19 [oz_av]

## 2017-12-22 LAB — MRSA PCR SCREENING: MRSA by PCR: NEGATIVE

## 2017-12-22 LAB — TSH: TSH: 2.663 u[IU]/mL (ref 0.350–4.500)

## 2017-12-22 LAB — CORTISOL: CORTISOL PLASMA: 37.2 ug/dL

## 2017-12-22 LAB — GLUCOSE, CAPILLARY
GLUCOSE-CAPILLARY: 117 mg/dL — AB (ref 65–99)
Glucose-Capillary: 109 mg/dL — ABNORMAL HIGH (ref 65–99)
Glucose-Capillary: 149 mg/dL — ABNORMAL HIGH (ref 65–99)

## 2017-12-22 LAB — TROPONIN I: TROPONIN I: 0.04 ng/mL — AB (ref <0.03)

## 2017-12-22 LAB — PROCALCITONIN: PROCALCITONIN: 12.38 ng/mL

## 2017-12-22 MED ORDER — SODIUM CHLORIDE 0.9 % IV BOLUS (SEPSIS)
1000.0000 mL | Freq: Once | INTRAVENOUS | Status: AC
  Administered 2017-12-22: 1000 mL via INTRAVENOUS

## 2017-12-22 MED ORDER — VANCOMYCIN HCL IN DEXTROSE 1-5 GM/200ML-% IV SOLN
1000.0000 mg | INTRAVENOUS | Status: DC
  Administered 2017-12-22 – 2017-12-23 (×2): 1000 mg via INTRAVENOUS
  Filled 2017-12-22 (×3): qty 200

## 2017-12-22 MED ORDER — HYDROCORTISONE NA SUCCINATE PF 100 MG IJ SOLR
100.0000 mg | Freq: Once | INTRAMUSCULAR | Status: AC
  Administered 2017-12-22: 100 mg via INTRAVENOUS
  Filled 2017-12-22: qty 2

## 2017-12-22 MED ORDER — PERFLUTREN LIPID MICROSPHERE
1.0000 mL | INTRAVENOUS | Status: AC | PRN
  Administered 2017-12-22: 3 mL via INTRAVENOUS
  Filled 2017-12-22: qty 10

## 2017-12-22 MED ORDER — SODIUM CHLORIDE 0.9 % IV BOLUS (SEPSIS): 1000.0000 mL | Freq: Once | INTRAVENOUS | Status: DC

## 2017-12-22 MED ORDER — HYDROMORPHONE HCL 1 MG/ML IJ SOLN
0.5000 mg | INTRAMUSCULAR | Status: DC | PRN
  Administered 2017-12-23: 1 mg via INTRAVENOUS
  Administered 2017-12-27 (×3): 0.5 mg via INTRAVENOUS
  Filled 2017-12-22 (×4): qty 0.5
  Filled 2017-12-22 (×2): qty 1
  Filled 2017-12-22: qty 0.5

## 2017-12-22 MED ORDER — SODIUM CHLORIDE 0.9 % IV SOLN
INTRAVENOUS | Status: DC
  Administered 2017-12-22 – 2017-12-27 (×4): via INTRAVENOUS

## 2017-12-22 NOTE — Progress Notes (Signed)
  Echocardiogram 2D Echocardiogram has been performed.  [name] 12/22/2017, 3:50 PM

## 2017-12-22 NOTE — H&P (Signed)
History and Physical    [name] WFU:[id number] DOB: Mar 08, 1935 DOA: 12/21/2017  PCP: Burnard Bunting, MD  Patient coming from: Home.  Chief Complaint: Abdominal pain.  HPI: [name] is a 82 y.o. male with history of CAD status post CABG, CHF status post AICD and history of A. fib on amiodarone, history of hypertension, atrial flutter not on anticoagulation secondary to subdural hematoma was brought to the ER after patient had complaint of abdominal pain.  Patient stated abdominal pain happened on 12/20/2017 after dinner.  Pain was in the epigastric and right upper quadrant.  Pain subsequently subsided but was brought to the ER the following day.  ED Course: In the ER patient had CT of the abdomen and pelvis and sonogram.  Sonogram of the abdomen did not show any definite signs of cholecystitis but has gallstones.  CT of the abdomen was showing features concerning for cystic duct stone.  On exam patient does have right upper quadrant tenderness.  HIDA scan was recommended.  Review of Systems: As per HPI, rest all negative.   Past Medical History:  Diagnosis Date  . Atrial arrhythmia/a fibrillation    not on coumadin 2/2 subdural hematoma  . Automatic implantable cardiac defibrillator in situ    medtronic  . Automatic implantable cardioverter-defibrillator in situ   . CHF (congestive heart failure) ([place])   . Chronic systolic congestive heart failure ([place])   . Dysrhythmia   . Encounter for long-term (current) use of other medications    amiodarone  . H/O hiatal hernia   . Hyperlipidemia   . Hypertension   . Hypothyroidism   . Ischemic cardiomyopathy    status post CABG with patent vein grafts and an atretic LIMA to his ramus, January 2012  . Myocardial infarction [facility])    1995, non-STEMI January 2012  . Pacemaker   . Subdural hematoma (H[facility])    on coumadin  . Thyroid disease    hypothyroid  . Ventricular fibrillation ([place])   . Ventricular tachycardia ([place])       VT Storm-January 2012- responded to Amiodarone    Past Surgical History:  Procedure Laterality Date  . bilateral cranitomies for sub hematomas    . BYPASS GRAFT  1995   5  . CORONARY ARTERY BYPASS GRAFT  1995   5  . IMPLANTABLE CARDIOVERTER DEFIBRILLATOR GENERATOR CHANGE N/A 03/28/2013   Procedure: IMPLANTABLE CARDIOVERTER DEFIBRILLATOR GENERATOR CHANGE;  Surgeon: Deboraha Sprang, MD;  Location: [facility] C[facility] LAB;  Service: Cardiovascular;  Laterality: N/A;  . INGUINAL HERNIA REPAIR Left 05/15/2014   Procedure: HERNIA REPAIR INGUINAL ADULT;  Surgeon: Zenovia Jarred, MD;  Location: [place];  Service: General;  Laterality: Left;  . INSERT / REPLACE / REMOVE PACEMAKER  14    generator repl   . INSERTION OF MESH Left 05/15/2014   Procedure: INSERTION OF MESH;  Surgeon: Zenovia Jarred, MD;  Location: [place];  Service: General;  Laterality: Left;  . TONSILLECTOMY       reports that  has never smoked. He has quit using smokeless tobacco. He reports that he does not drink alcohol or use drugs.  No Known Allergies  Family History  Problem Relation Age of Onset  . Cerebral aneurysm Father   . Hypertension Father     Prior to Admission medications   Medication Sig Start Date End Date Taking? Authorizing Provider  amiodarone (PACERONE) 100 MG tablet Take 0.5 tablets (50 mg total) by mouth daily. 11/30/17  Yes  Deboraha Sprang, MD  aspirin EC 81 MG tablet Take 81 mg by mouth daily.   Yes [provider]  b complex vitamins tablet Take 1 tablet by mouth daily.   Yes [provider]  carvedilol (COREG) 6.25 MG tablet TAKE 1 TABLET BY MOUTH 2 TIMES DAILY WITH A MEAL. Patient taking differently: TAKE 1 TABLET (6.25 MG) BY MOUTH 2 TIMES DAILY WITH A MEAL. 07/02/17  Yes Deboraha Sprang, MD  Coenzyme Q10 (COQ10 PO) Take 1 capsule by mouth daily.   Yes [provider]  donepezil (ARICEPT) 5 MG tablet Take 5 mg by mouth daily.    Yes [provider]  fenofibrate 160 MG  tablet Take 1 tablet (160 mg total) by mouth daily. 09/17/17  Yes Nahser, Wonda Cheng, MD  lisinopril (PRINIVIL,ZESTRIL) 5 MG tablet Take 1 tablet (5 mg total) by mouth daily. 08/14/17  Yes Nahser, Wonda Cheng, MD  LYCOPENE PO Take 1 capsule by mouth daily.   Yes [provider]  Multiple Vitamin (MULTIVITAMIN WITH MINERALS) TABS tablet Take 1 tablet by mouth daily.   Yes [provider]  niacin (SLO-NIACIN) 500 MG tablet Take 500 mg by mouth daily.    Yes [provider]  nitroGLYCERIN (NITROSTAT) 0.4 MG SL tablet Place 1 tablet (0.4 mg total) under the tongue every 5 (five) minutes as needed for chest pain. For chest pain x 3 doses Patient taking differently: Place 0.4 mg under the tongue every 5 (five) minutes as needed for chest pain (max 3 doses).  03/22/13  Yes Deboraha Sprang, MD  OVER THE COUNTER MEDICATION Take 1 tablet by mouth daily. "prostrate essentials"   Yes [provider]  spironolactone (ALDACTONE) 25 MG tablet Take 1 tablet (25 mg total) by mouth daily. 11/30/17  Yes Nahser, Wonda Cheng, MD  Tamsulosin HCl (FLOMAX) 0.4 MG CAPS Take 0.4 mg by mouth at bedtime.  03/31/11  Yes [provider]  finasteride (PROSCAR) 5 MG tablet Take 5 mg by mouth daily. 04/01/16   [provider]  levothyroxine (SYNTHROID, LEVOTHROID) 75 MCG tablet Take 75 mcg by mouth daily. 12/11/17   [provider]    Physical Exam: Vitals:   12/21/17 1544 12/21/17 1843 12/21/17 2137 12/22/17 0654  BP: (!) 109/55 (!) 116/58 (!) 108/53 (!) 79/49  Pulse: 66 84 87 86  Resp: 13 15 16 17   Temp:  100 F (37.8 C) (!) 100.4 F (38 C) 97.8 F (36.6 C)  TempSrc:  Oral Oral Oral  SpO2: 95% 92% 92% 94%  Weight:  93.9 kg (207 lb 0.2 oz)    Height:  5\' 11"  (1.803 m)        Constitutional: Moderately built and nourished. Vitals:   12/21/17 1544 12/21/17 1843 12/21/17 2137 12/22/17 0654  BP: (!) 109/55 (!) 116/58 (!) 108/53 (!) 79/49  Pulse: 66 84 87 86  Resp: 13  15 16 17   Temp:  100 F (37.8 C) (!) 100.4 F (38 C) 97.8 F (36.6 C)  TempSrc:  Oral Oral Oral  SpO2: 95% 92% 92% 94%  Weight:  93.9 kg (207 lb 0.2 oz)    Height:  5\' 11"  (1.803 m)     Eyes: Anicteric no pallor. ENMT: No discharge from the ears eyes nose or mouth. Neck: No mass palpated no neck rigidity. Respiratory: No rhonchi or crepitations. Cardiovascular: S1-S2 heard. Abdomen: Right upper quadrant tenderness. Musculoskeletal: No edema. Skin: No rash. Neurologic: Alert awake oriented to his name has  memory issues.  Follows commands and moves all extremities. Psychiatric: Has dementia.   Labs on Admission: I have personally reviewed following labs and imaging studies  CBC: Recent Labs  Lab 12/21/17 0908 12/22/17 0505  WBC 9.9 23.2*  NEUTROABS 7.9*  --   HGB 14.5 15.1  HCT 42.3 44.7  MCV 91.6 94.3  PLT 169 212   Basic Metabolic Panel: Recent Labs  Lab 12/21/17 0836 12/22/17 0505  NA 137 136  K 4.3 4.0  CL 104 104  CO2 25 20*  GLUCOSE 133* 123*  BUN 17 21*  CREATININE 1.14 2.54*  CALCIUM 9.0 8.7*   GFR: Estimated Creatinine Clearance: 27.1 mL/min (A) (by C-G formula based on SCr of 2.54 mg/dL (H)). Liver Function Tests: Recent Labs  Lab 12/21/17 0836 12/22/17 0505  AST 33 47*  ALT 19 32  ALKPHOS 29* 24*  BILITOT 0.7 1.7*  PROT 6.5 6.1*  ALBUMIN 3.7 3.3*   Recent Labs  Lab 12/21/17 0836  LIPASE 25   No results for input(s): AMMONIA in the last 168 hours. Coagulation Profile: No results for input(s): INR, PROTIME in the last 168 hours. Cardiac Enzymes: No results for input(s): CKTOTAL, CKMB, CKMBINDEX, TROPONINI in the last 168 hours. BNP (last 3 results) No results for input(s): PROBNP in the last 8760 hours. HbA1C: No results for input(s): HGBA1C in the last 72 hours. CBG: Recent Labs  Lab 12/22/17 0035  GLUCAP 109*   Lipid Profile: No results for input(s): CHOL, HDL, LDLCALC, TRIG, CHOLHDL, LDLDIRECT in the last 72  hours. Thyroid Function Tests: No results for input(s): TSH, T4TOTAL, FREET4, T3FREE, THYROIDAB in the last 72 hours. Anemia Panel: No results for input(s): VITAMINB12, FOLATE, FERRITIN, TIBC, IRON, RETICCTPCT in the last 72 hours. Urine analysis:    Component Value Date/Time   COLORURINE YELLOW 12/21/2017 1038   APPEARANCEUR CLEAR 12/21/2017 1038   LABSPEC 1.010 12/21/2017 1038   PHURINE 6.5 12/21/2017 1038   GLUCOSEU NEGATIVE 12/21/2017 1038   HGBUR NEGATIVE 12/21/2017 [place] 12/21/2017 1038   [place] 12/21/2017 1038   PROTEINUR NEGATIVE 12/21/2017 1038   NITRITE NEGATIVE 12/21/2017 1038   LEUKOCYTESUR NEGATIVE 12/21/2017 1038   Sepsis Labs: @LABRCNTIP (procalcitonin:4,lacticidven:4) )No results found for this or any previous visit (from the past 240 hour(s)).   Radiological Exams on Admission: Ct Abdomen Pelvis W Contrast  Result Date: 12/21/2017 CLINICAL DATA:  Abdominal distension since last night. Hernia repair. EXAM: CT ABDOMEN AND PELVIS WITH CONTRAST TECHNIQUE: Multidetector CT imaging of the abdomen and pelvis was performed using the standard protocol following bolus administration of intravenous contrast. CONTRAST:  134mL ISOVUE-300 IOPAMIDOL (ISOVUE-300) INJECTION 61% COMPARISON:  None. FINDINGS: Lower chest: There is atelectasis in the lung bases with no suspicious infiltrates, nodules, or masses. Cardiomegaly is identified. Pacemaker leads are seen. Hepatobiliary: The left lobe of the liver is relatively atrophic. No hepatic masses are seen. The portal vein is patent. The gallbladder is distended. There is mild increased attenuation in the fat along the undersurface of the gallbladder best seen on coronal images. Cholelithiasis is noted. There is suggestion of stones in the cystic duct on coronal imaging. No common bile duct stones are seen on this study. However, there is mild prominence of the intrahepatic bile ducts on today's study.  Pancreas: Unremarkable. No pancreatic ductal dilatation or surrounding inflammatory changes. Spleen: Normal in size without focal abnormality. Adrenals/Urinary Tract: Adrenal glands are normal. There is a cyst in the right kidney. No hydronephrosis or suspicious masses.  No ureterectasis or ureteral stones. The bladder is normal. Stomach/Bowel: The stomach is normal. There is a duodenal diverticulum without evidence of inflammation. The small bowel is otherwise normal. Diffuse colonic diverticulosis is seen without focal diverticulitis. The appendix is well seen with no evidence of appendicitis. Vascular/Lymphatic: Atherosclerotic change is seen in the nonaneurysmal aorta, iliac vessels, and femoral vessels. No adenopathy. Reproductive: The prostate is enlarged. Other: No free air or free fluid. Mild increased attenuation in the fat of the abdomen may be due to volume overload. Musculoskeletal: No acute or significant osseous findings. IMPRESSION: 1. The gallbladder is distended and contains numerous stones. There is suggestion of stones in the cystic duct. There is mild fat stranding along the inferior surface of the gallbladder on coronal images. The findings are concerning for acute cholecystitis. Mild intrahepatic ductal prominence. Recommend clinical correlation and ultrasound. 2. Atherosclerotic change in the nonaneurysmal aorta. 3. Diverticulosis without focal diverticulitis. Electronically Signed   By: Dorise Bullion III M.D   On: 12/21/2017 09:59   US Abdomen Limited Ruq  Result Date: 12/21/2017 CLINICAL DATA:  Biliary colic. EXAM: ULTRASOUND ABDOMEN LIMITED RIGHT UPPER QUADRANT COMPARISON:  CT scan of same day. FINDINGS: Gallbladder: Cholelithiasis is noted with largest calculus measuring 2 cm. No significant gallbladder wall thickening or pericholecystic fluid is noted. No sonographic Murphy's sign is noted. Common bile duct: Diameter: 5.5 mm which is within normal limits. Liver: No focal lesion  identified. Within normal limits in parenchymal echogenicity. Portal vein is patent on color Doppler imaging with normal direction of blood flow towards the liver. IMPRESSION: Cholelithiasis is noted without definite evidence of cholecystitis. If there is clinical concern for cholecystitis, HIDA scan is recommended for further evaluation. Electronically Signed   By: Marijo Conception, M.D.   On: 12/21/2017 12:18    Assessment/Plan Principal Problem:   Cholelithiases Active Problems:   Ischemic cardiomyopathy   Chronic systolic CHF (congestive heart failure) (HCC)   Atrial arrhythmia/a fibrillation   ICD (implantable cardioverter-defibrillator) in place   CAD (coronary artery disease)   Cholelithiasis    1. Sepsis probably from cholecystitis -appreciate general surgery consult.  HIDA scan has been ordered patient is kept n.p.o. and on IV Zosyn.  Since patient likely is getting septic blood cultures lactic acid and pro calcitonin order has been ordered.  Fluid bolus has been ordered closely observe for any respiratory distress given history of CHF. 2. History of ischemic cardiomyopathy -appears compensated closely observe respiratory status given that patient is receiving fluid bolus. 3. History of CAD status post CABG -check troponin. 4. History of V.  V. tach storm on amiodarone. 5. History of BPH we will hold tamsulosin due to low normal blood pressure. 6. Hypertension we will hold antihypertensives due to low normal blood pressure. 7. History of atrial flutter fibrillation presently holding Coreg due to low normal blood pressure.   DVT prophylaxis: SCDs. Code Status: Full code. Family Communication: Discussed with patient. Disposition Plan: To be determined. Consults called: General surgery. Admission status: Inpatient.   Rise Patience MD [facility] Pager [phone].  If 7PM-7AM, please contact night-coverage [email or web address] Password [facility]  12/22/2017, 8:35 AM

## 2017-12-22 NOTE — Progress Notes (Signed)
Pharmacy Antibiotic Note  [name] is a 82 y.o. male admitted on 12/21/2017 with abdominal pain.  Pharmacy has been consulted for vancomycin dosing for sepsis.  Patient is already on Zosyn per MD.  He has AKI (SCr 1.14 > 2.54).  Tmax 100.4, WBC 23.2, PCT 12, LA 2.6.   Plan: Vanc 1gm IV Q24H for goal trough 15-20 mcg/mL Continue Zosyn EI 3.375gm IV Q8H per MD Monitor renal fxn, clinical progress, vanc trough at Css   Height: 5\' 11"  (180.3 cm) Weight: 207 lb 0.2 oz (93.9 kg) IBW/kg (Calculated) : 75.3  Temp (24hrs), Avg:99.2 F (37.3 C), Min:97.8 F (36.6 C), Max:100.4 F (38 C)  Recent Labs  Lab 12/21/17 0836 12/21/17 0908 12/22/17 0505 12/22/17 0934  WBC  --  9.9 23.2*  --   CREATININE 1.14  --  2.54*  --   LATICACIDVEN  --   --   --  2.6*    Estimated Creatinine Clearance: 27.1 mL/min (A) (by C-G formula based on SCr of 2.54 mg/dL (H)).    No Known Allergies   Vanc 12/25 >> Zosyn 12/25 >>  12/25 BCx -    Aracelia Brinson D. Mina Marble, PharmD, BCPS Pager:  [phone] 12/22/2017, 12:59 PM

## 2017-12-22 NOTE — Progress Notes (Signed)
Pt.B/P was low 70/49 with recheck and MD Rai. Notified.

## 2017-12-22 NOTE — Consult Note (Signed)
Reason for Consult: gallstones Referring Physician: Rhyker, Silversmith is an 82 y.o. male.  HPI: (per chart) 82 yo male admitted for periumbilical pain and found to have gallstones on imaging and concern for cholecystitis.   (per patient) Currently he is having no pain. He is not nauseated. He thinks he had a bowel movement yesterday.  He has a history of CHF and CAD s/p CABG in 2012 with AICD in place with systolic dysfunction with EF between 15-30%.  Past Medical History:  Diagnosis Date  . Atrial arrhythmia/a fibrillation    not on coumadin 2/2 subdural hematoma  . Automatic implantable cardiac defibrillator in situ    medtronic  . Automatic implantable cardioverter-defibrillator in situ   . CHF (congestive heart failure) ([place])   . Chronic systolic congestive heart failure ([place])   . Dysrhythmia   . Encounter for long-term (current) use of other medications    amiodarone  . H/O hiatal hernia   . Hyperlipidemia   . Hypertension   . Hypothyroidism   . Ischemic cardiomyopathy    status post CABG with patent vein grafts and an atretic LIMA to his ramus, January 2012  . Myocardial infarction [facility])    1995, non-STEMI January 2012  . Pacemaker   . Subdural hematoma (H[facility])    on coumadin  . Thyroid disease    hypothyroid  . Ventricular fibrillation ([place])   . Ventricular tachycardia ([place])     VT [place]-January 2012- responded to Amiodarone    Past Surgical History:  Procedure Laterality Date  . bilateral cranitomies for sub hematomas    . BYPASS GRAFT  1995   5  . CORONARY ARTERY BYPASS GRAFT  1995   5  . IMPLANTABLE CARDIOVERTER DEFIBRILLATOR GENERATOR CHANGE N/A 03/28/2013   Procedure: IMPLANTABLE CARDIOVERTER DEFIBRILLATOR GENERATOR CHANGE;  Surgeon: Deboraha Sprang, MD;  Location: [facility] CATH LAB;  Service: Cardiovascular;  Laterality: N/A;  . INGUINAL HERNIA REPAIR Left 05/15/2014   Procedure: HERNIA REPAIR INGUINAL ADULT;  Surgeon: Zenovia Jarred, MD;  Location: [place];   Service: General;  Laterality: Left;  . INSERT / REPLACE / REMOVE PACEMAKER  14    generator repl   . INSERTION OF MESH Left 05/15/2014   Procedure: INSERTION OF MESH;  Surgeon: Zenovia Jarred, MD;  Location: [place];  Service: General;  Laterality: Left;  . TONSILLECTOMY      Family History  Problem Relation Age of Onset  . Cerebral aneurysm Father   . Hypertension Father     Social History:  reports that  has never smoked. He has quit using smokeless tobacco. He reports that he does not drink alcohol or use drugs.  Allergies: No Known Allergies  Medications: I have reviewed the patient's current medications.  Results for orders placed or performed during the hospital encounter of 12/21/17 (from the past 48 hour(s))  Comprehensive metabolic panel     Status: Abnormal   Collection Time: 12/21/17  8:36 AM  Result Value Ref Range   Sodium 137 135 - 145 mmol/L   Potassium 4.3 3.5 - 5.1 mmol/L   Chloride 104 101 - 111 mmol/L   CO2 25 22 - 32 mmol/L   Glucose, Bld 133 (H) 65 - 99 mg/dL   BUN 17 6 - 20 mg/dL   Creatinine, Ser 1.14 0.61 - 1.24 mg/dL   Calcium 9.0 8.9 - 10.3 mg/dL   Total Protein 6.5 6.5 - 8.1 g/dL   Albumin 3.7 3.5 - 5.0  g/dL   AST 33 15 - 41 U/L   ALT 19 17 - 63 U/L   Alkaline Phosphatase 29 (L) 38 - 126 U/L   Total Bilirubin 0.7 0.3 - 1.2 mg/dL   GFR calc non Af Amer 59 (L) >60 mL/min   GFR calc Af Amer >60 >60 mL/min    Comment: (NOTE) The eGFR has been calculated using the CKD EPI equation. This calculation has not been validated in all clinical situations. eGFR's persistently <60 mL/min signify possible Chronic Kidney Disease.    Anion gap 8 5 - 15  Lipase, blood     Status: None   Collection Time: 12/21/17  8:36 AM  Result Value Ref Range   Lipase 25 11 - 51 U/L  CBC     Status: None   Collection Time: 12/21/17  9:08 AM  Result Value Ref Range   WBC 9.9 4.0 - 10.5 K/uL   RBC 4.62 4.22 - 5.81 MIL/uL   Hemoglobin 14.5 13.0 - 17.0 g/dL   HCT 42.3  39.0 - 52.0 %   MCV 91.6 78.0 - 100.0 fL   MCH 31.4 26.0 - 34.0 pg   MCHC 34.3 30.0 - 36.0 g/dL   RDW 13.5 11.5 - 15.5 %   Platelets 169 150 - 400 K/uL  Differential     Status: Abnormal   Collection Time: 12/21/17  9:08 AM  Result Value Ref Range   Neutrophils Relative % 79 %   Neutro Abs 7.9 (H) 1.7 - 7.7 K/uL   Lymphocytes Relative 13 %   Lymphs Abs 1.3 0.7 - 4.0 K/uL   Monocytes Relative 7 %   Monocytes Absolute 0.7 0.1 - 1.0 K/uL   Eosinophils Relative 1 %   Eosinophils Absolute 0.1 0.0 - 0.7 K/uL   Basophils Relative 0 %   Basophils Absolute 0.0 0.0 - 0.1 K/uL  Urinalysis, Routine w reflex microscopic     Status: None   Collection Time: 12/21/17 10:38 AM  Result Value Ref Range   Color, Urine YELLOW YELLOW   APPearance CLEAR CLEAR   Specific Gravity, Urine 1.010 1.005 - 1.030   pH 6.5 5.0 - 8.0   Glucose, UA NEGATIVE NEGATIVE mg/dL   Hgb urine dipstick NEGATIVE NEGATIVE   Bilirubin Urine NEGATIVE NEGATIVE   Ketones, ur NEGATIVE NEGATIVE mg/dL   Protein, ur NEGATIVE NEGATIVE mg/dL   Nitrite NEGATIVE NEGATIVE   Leukocytes, UA NEGATIVE NEGATIVE    Comment: Microscopic not done on urines with negative protein, blood, leukocytes, nitrite, or glucose < 500 mg/dL.  Glucose, capillary     Status: Abnormal   Collection Time: 12/22/17 12:35 AM  Result Value Ref Range   Glucose-Capillary 109 (H) 65 - 99 mg/dL  Basic metabolic panel     Status: Abnormal   Collection Time: 12/22/17  5:05 AM  Result Value Ref Range   Sodium 136 135 - 145 mmol/L   Potassium 4.0 3.5 - 5.1 mmol/L   Chloride 104 101 - 111 mmol/L   CO2 20 (L) 22 - 32 mmol/L   Glucose, Bld 123 (H) 65 - 99 mg/dL   BUN 21 (H) 6 - 20 mg/dL   Creatinine, Ser 2.54 (H) 0.61 - 1.24 mg/dL   Calcium 8.7 (L) 8.9 - 10.3 mg/dL   GFR calc non Af Amer 22 (L) >60 mL/min   GFR calc Af Amer 26 (L) >60 mL/min    Comment: (NOTE) The eGFR has been calculated using the CKD EPI equation. This calculation has  not been validated in  all clinical situations. eGFR's persistently <60 mL/min signify possible Chronic Kidney Disease.    Anion gap 12 5 - 15  CBC     Status: Abnormal   Collection Time: 12/22/17  5:05 AM  Result Value Ref Range   WBC 23.2 (H) 4.0 - 10.5 K/uL   RBC 4.74 4.22 - 5.81 MIL/uL   Hemoglobin 15.1 13.0 - 17.0 g/dL   HCT 44.7 39.0 - 52.0 %   MCV 94.3 78.0 - 100.0 fL   MCH 31.9 26.0 - 34.0 pg   MCHC 33.8 30.0 - 36.0 g/dL   RDW 14.1 11.5 - 15.5 %   Platelets 161 150 - 400 K/uL  Hepatic function panel     Status: Abnormal   Collection Time: 12/22/17  5:05 AM  Result Value Ref Range   Total Protein 6.1 (L) 6.5 - 8.1 g/dL   Albumin 3.3 (L) 3.5 - 5.0 g/dL   AST 47 (H) 15 - 41 U/L   ALT 32 17 - 63 U/L   Alkaline Phosphatase 24 (L) 38 - 126 U/L   Total Bilirubin 1.7 (H) 0.3 - 1.2 mg/dL   Bilirubin, Direct 0.4 0.1 - 0.5 mg/dL   Indirect Bilirubin 1.3 (H) 0.3 - 0.9 mg/dL    Ct Abdomen Pelvis W Contrast  Result Date: 12/21/2017 CLINICAL DATA:  Abdominal distension since last night. Hernia repair. EXAM: CT ABDOMEN AND PELVIS WITH CONTRAST TECHNIQUE: Multidetector CT imaging of the abdomen and pelvis was performed using the standard protocol following bolus administration of intravenous contrast. CONTRAST:  127m ISOVUE-300 IOPAMIDOL (ISOVUE-300) INJECTION 61% COMPARISON:  None. FINDINGS: Lower chest: There is atelectasis in the lung bases with no suspicious infiltrates, nodules, or masses. Cardiomegaly is identified. Pacemaker leads are seen. Hepatobiliary: The left lobe of the liver is relatively atrophic. No hepatic masses are seen. The portal vein is patent. The gallbladder is distended. There is mild increased attenuation in the fat along the undersurface of the gallbladder best seen on coronal images. Cholelithiasis is noted. There is suggestion of stones in the cystic duct on coronal imaging. No common bile duct stones are seen on this study. However, there is mild prominence of the intrahepatic bile  ducts on today's study. Pancreas: Unremarkable. No pancreatic ductal dilatation or surrounding inflammatory changes. Spleen: Normal in size without focal abnormality. Adrenals/Urinary Tract: Adrenal glands are normal. There is a cyst in the right kidney. No hydronephrosis or suspicious masses. No ureterectasis or ureteral stones. The bladder is normal. Stomach/Bowel: The stomach is normal. There is a duodenal diverticulum without evidence of inflammation. The small bowel is otherwise normal. Diffuse colonic diverticulosis is seen without focal diverticulitis. The appendix is well seen with no evidence of appendicitis. Vascular/Lymphatic: Atherosclerotic change is seen in the nonaneurysmal aorta, iliac vessels, and femoral vessels. No adenopathy. Reproductive: The prostate is enlarged. Other: No free air or free fluid. Mild increased attenuation in the fat of the abdomen may be due to volume overload. Musculoskeletal: No acute or significant osseous findings. IMPRESSION: 1. The gallbladder is distended and contains numerous stones. There is suggestion of stones in the cystic duct. There is mild fat stranding along the inferior surface of the gallbladder on coronal images. The findings are concerning for acute cholecystitis. Mild intrahepatic ductal prominence. Recommend clinical correlation and ultrasound. 2. Atherosclerotic change in the nonaneurysmal aorta. 3. Diverticulosis without focal diverticulitis. Electronically Signed   By: DDorise BullionIII M.D   On: 12/21/2017 09:59   UKoreaAbdomen Limited  Ruq  Result Date: 12/21/2017 CLINICAL DATA:  Biliary colic. EXAM: ULTRASOUND ABDOMEN LIMITED RIGHT UPPER QUADRANT COMPARISON:  CT scan of same day. FINDINGS: Gallbladder: Cholelithiasis is noted with largest calculus measuring 2 cm. No significant gallbladder wall thickening or pericholecystic fluid is noted. No sonographic Murphy's sign is noted. Common bile duct: Diameter: 5.5 mm which is within normal limits.  Liver: No focal lesion identified. Within normal limits in parenchymal echogenicity. Portal vein is patent on color Doppler imaging with normal direction of blood flow towards the liver. IMPRESSION: Cholelithiasis is noted without definite evidence of cholecystitis. If there is clinical concern for cholecystitis, HIDA scan is recommended for further evaluation. Electronically Signed   By: Marijo Conception, M.D.   On: 12/21/2017 12:18    Review of Systems  Unable to perform ROS: Dementia   Blood pressure (!) 108/53, pulse 87, temperature (!) 100.4 F (38 C), temperature source Oral, resp. rate 16, height _0  (1.803 m), weight 93.9 kg (207 lb 0.2 oz), SpO2 92 %. Physical Exam  Vitals reviewed. Constitutional: He appears well-developed and well-nourished.  HENT:  Head: Normocephalic and atraumatic.  Eyes: Conjunctivae and EOM are normal. Pupils are equal, round, and reactive to light.  Neck: Normal range of motion. Neck supple.  Cardiovascular: Normal rate and regular rhythm.  Respiratory: Effort normal and breath sounds normal.  GI: Soft. Bowel sounds are normal. He exhibits no distension. There is no tenderness.  Musculoskeletal: Normal range of motion.  Neurological: He is alert.  Skin: Skin is warm and dry.  Psychiatric: He has a normal mood and affect. His behavior is normal.    Assessment/Plan: 82 yo male with multiple heart issues with leukocytosis, stones on imaging without wall thickening or duct dilation. Demented patient complaining of no pain -agree with hida scan (no ejection fraction needed) -if hida is positive for would probably proceed with cholecystostomy given heart history  Arta Bruce [name] Muldoon 12/22/2017, 6:34 AM

## 2017-12-22 NOTE — Progress Notes (Addendum)
[facility]                                                                              Patient Demographics  [name], is a 82 y.o. male, DOB - 03-27-1935, LZJ:[id number]  Admit date - 12/21/2017   Admitting Physician Desiree Hane, MD  Outpatient Primary MD for the patient is Burnard Bunting, MD  Outpatient specialists:   LOS - 1  days   Medical records reviewed and are as summarized below:    Chief Complaint  Patient presents with  . Abdominal Pain       Brief summary   Patient is a 82 year old male with history of CAD status post CABG, CHF status post AICD, history of atrial fibrillation on amiodarone, hypertension, atrial flutter, not on anticoagulation secondary to subdural hematoma presented with epigastric and right upper quadrant abdominal pain after dinner.  CT abdomen pelvis showed cystic duct stone, ultrasound did not show any definite signs of cholecystitis but has gallstones.  HIDA scan recommended. General surgery consulted.  Assessment & Plan    Principal Problem: Sepsis with cholecystitis/Cholelithiases -Patient met sepsis criteria at the time of admission with hypotension, leukocytosis, fever, acute kidney injury, source likely acute cholecystitis -BP low however patient alert and oriented, discontinued lisinopril, Coreg.  Continue to hold spironolactone -Ordered stat lactic acid, pro-calcitonin, cortisol, blood cultures, IV fluid bolus (cautious fluids due to history of systolic CHF) - give 1 dose of IV Solu-Cortef due to persistently low BP.  If no significant improvement with fluids, Solu-Cortef, may need vasopressors -Continue IV Zosyn, follow blood cultures -Follow HIDA scan, surgery following Addendum 12:40 BP improving to 80s, patient alert and awake.  Given Solu-Cortef 100 mg x1, watch for fluid overload. Elevated lactic acidosis, pro-calcitonin 12.38 -Ordered blood cultures, add MRSA screen, add broad-spectrum  antibiotics vancomycin IV, continue Zosyn.  Addendum: 1:12pm Patient seen and examined again, family at the bedside.  Per patient and his wife, states that he always run low BP.  Patient is alert and oriented, no complaints at this time.  Prior to my encounter he was ambulating.  Active Problems: Acute kidney injury -Creatinine 1.14 at the time of admission, increased to 2.5, possibly due to hypotension, sepsis.  Baseline creatinine 1.2 on 4/17 -Hold lisinopril, Coreg, spironolactone, placed on IV fluid hydration -If no significant improvement, will obtain renal ultrasound    Ischemic cardiomyopathy, Chronic systolic CHF (congestive heart failure) (HCC) -Appears compensated for now, placed on IV fluids, cautiously monitor to avoid fluid overload    Atrial arrhythmia/a fibrillation -Currently rate controlled, hold Coreg, continue amiodarone -Not on anticoagulation secondary to history of subdural hematoma    ICD (implantable cardioverter-defibrillator) in place, CAD (coronary artery disease) -Currently no chest pain or acute shortness of breath -May need to repeat 2D echo, follow troponins Addendum Troponin  0.04, no recent echo in system, obtain 2D echo, no complaints of chest pain, obtain ekg  History of V. Tach -ICD in place, continue on low-dose amiodarone  Hypothyroidism -Continue Synthroid, follow TSH  Code Status: Full CODE STATUS DVT Prophylaxis: SCD's Family Communication: Discussed in detail with the patient, all imaging results,  lab results explained to the patient, son, wife and daughter at the bedside   Disposition Plan:   Time Spent in minutes 35 minutes  Procedures:  CT abdomen RUQ Abdominal ultrasound  Consultants:   General surgery  Antimicrobials:   IV Zosyn   Medications  Scheduled Meds: . amiodarone  50 mg Oral Daily  . donepezil  5 mg Oral Daily  . fenofibrate  160 mg Oral Daily  . finasteride  5 mg Oral Daily  . hydrocortisone sod  succinate (SOLU-CORTEF) inj  100 mg Intravenous Once  . levothyroxine  75 mcg Oral QAC breakfast   Continuous Infusions: . sodium chloride 100 mL/hr at 12/22/17 0845  . piperacillin-tazobactam (ZOSYN)  IV 3.375 g (12/22/17 0706)   PRN Meds:.acetaminophen **OR** acetaminophen, HYDROmorphone (DILAUDID) injection, nitroGLYCERIN, ondansetron **OR** ondansetron (ZOFRAN) IV   Antibiotics   Anti-infectives (From admission, onward)   Start     Dose/Rate Route Frequency Ordered Stop   12/21/17 2330  piperacillin-tazobactam (ZOSYN) IVPB 3.375 g     3.375 g 12.5 mL/hr over 240 Minutes Intravenous Every 8 hours 12/21/17 2302          Subjective:   [name] was seen and examined today.  BP low however patient is alert and oriented.  Abdominal pain improving.  No nausea or vomiting or diarrhea.  Afebrile.  Patient denies dizziness, chest pain, shortness of breath,  new weakness, numbess, tingling.   Objective:   Vitals:   12/22/17 0654 12/22/17 0855 12/22/17 0931 12/22/17 1015  BP: (!) 79/49 (!) 78/41 (!) 83/45 (!) 85/48  Pulse: 86 67    Resp: 17 16    Temp: 97.8 F (36.6 C) 98.6 F (37 C)    TempSrc: Oral Oral    SpO2: 94% 93% 95% 95%  Weight:      Height:        Intake/Output Summary (Last 24 hours) at 12/22/2017 1023 Last data filed at 12/22/2017 5615 Gross per 24 hour  Intake 0 ml  Output 0 ml  Net 0 ml     Wt Readings from Last 3 Encounters:  12/21/17 93.9 kg (207 lb 0.2 oz)  08/14/17 74 kg (163 lb 3.2 oz)  04/14/17 74.8 kg (165 lb)     Exam  General: Alert and oriented, NAD  Eyes:  HEENT:  Atraumatic, normocephalic, normal oropharynx  Cardiovascular: S1 S2 clear, RRR   Respiratory: Clear to auscultation bilaterally, no wheezing, rales or rhonchi  Gastrointestinal: Soft, right upper quadrant tenderness to deep palpation, no rebound tenderness, nondistended, + bowel sounds  Ext: no pedal edema bilaterally  Neuro: no neurological  deficits  Musculoskeletal: No digital cyanosis, clubbing  Skin: No rashes  Psych: Normal affect and demeanor, alert and oriented     Data Reviewed:  I have personally reviewed following labs and imaging studies  Micro Results No results found for this or any previous visit (from the past 240 hour(s)).  Radiology Reports Ct Abdomen Pelvis W Contrast  Result Date: 12/21/2017 CLINICAL DATA:  Abdominal distension since last night. Hernia repair. EXAM: CT ABDOMEN AND PELVIS WITH CONTRAST TECHNIQUE: Multidetector CT imaging of the abdomen and pelvis was performed using the standard protocol following bolus administration of intravenous contrast. CONTRAST:  118m ISOVUE-300 IOPAMIDOL (ISOVUE-300) INJECTION 61% COMPARISON:  None. FINDINGS: Lower chest: There is atelectasis in the lung bases with no suspicious infiltrates, nodules, or masses. Cardiomegaly is identified. Pacemaker leads are seen. Hepatobiliary: The left lobe of the liver is relatively atrophic. No hepatic masses  are seen. The portal vein is patent. The gallbladder is distended. There is mild increased attenuation in the fat along the undersurface of the gallbladder best seen on coronal images. Cholelithiasis is noted. There is suggestion of stones in the cystic duct on coronal imaging. No common bile duct stones are seen on this study. However, there is mild prominence of the intrahepatic bile ducts on today's study. Pancreas: Unremarkable. No pancreatic ductal dilatation or surrounding inflammatory changes. Spleen: Normal in size without focal abnormality. Adrenals/Urinary Tract: Adrenal glands are normal. There is a cyst in the right kidney. No hydronephrosis or suspicious masses. No ureterectasis or ureteral stones. The bladder is normal. Stomach/Bowel: The stomach is normal. There is a duodenal diverticulum without evidence of inflammation. The small bowel is otherwise normal. Diffuse colonic diverticulosis is seen without focal  diverticulitis. The appendix is well seen with no evidence of appendicitis. Vascular/Lymphatic: Atherosclerotic change is seen in the nonaneurysmal aorta, iliac vessels, and femoral vessels. No adenopathy. Reproductive: The prostate is enlarged. Other: No free air or free fluid. Mild increased attenuation in the fat of the abdomen may be due to volume overload. Musculoskeletal: No acute or significant osseous findings. IMPRESSION: 1. The gallbladder is distended and contains numerous stones. There is suggestion of stones in the cystic duct. There is mild fat stranding along the inferior surface of the gallbladder on coronal images. The findings are concerning for acute cholecystitis. Mild intrahepatic ductal prominence. Recommend clinical correlation and ultrasound. 2. Atherosclerotic change in the nonaneurysmal aorta. 3. Diverticulosis without focal diverticulitis. Electronically Signed   By: Dorise Bullion III M.D   On: 12/21/2017 09:59   US Abdomen Limited Ruq  Result Date: 12/21/2017 CLINICAL DATA:  Biliary colic. EXAM: ULTRASOUND ABDOMEN LIMITED RIGHT UPPER QUADRANT COMPARISON:  CT scan of same day. FINDINGS: Gallbladder: Cholelithiasis is noted with largest calculus measuring 2 cm. No significant gallbladder wall thickening or pericholecystic fluid is noted. No sonographic Murphy's sign is noted. Common bile duct: Diameter: 5.5 mm which is within normal limits. Liver: No focal lesion identified. Within normal limits in parenchymal echogenicity. Portal vein is patent on color Doppler imaging with normal direction of blood flow towards the liver. IMPRESSION: Cholelithiasis is noted without definite evidence of cholecystitis. If there is clinical concern for cholecystitis, HIDA scan is recommended for further evaluation. Electronically Signed   By: Marijo Conception, M.D.   On: 12/21/2017 12:18    Lab Data:  CBC: Recent Labs  Lab 12/21/17 0908 12/22/17 0505  WBC 9.9 23.2*  NEUTROABS 7.9*  --   HGB  14.5 15.1  HCT 42.3 44.7  MCV 91.6 94.3  PLT 169 356   Basic Metabolic Panel: Recent Labs  Lab 12/21/17 0836 12/22/17 0505  NA 137 136  K 4.3 4.0  CL 104 104  CO2 25 20*  GLUCOSE 133* 123*  BUN 17 21*  CREATININE 1.14 2.54*  CALCIUM 9.0 8.7*   GFR: Estimated Creatinine Clearance: 27.1 mL/min (A) (by C-G formula based on SCr of 2.54 mg/dL (H)). Liver Function Tests: Recent Labs  Lab 12/21/17 0836 12/22/17 0505  AST 33 47*  ALT 19 32  ALKPHOS 29* 24*  BILITOT 0.7 1.7*  PROT 6.5 6.1*  ALBUMIN 3.7 3.3*   Recent Labs  Lab 12/21/17 0836  LIPASE 25   No results for input(s): AMMONIA in the last 168 hours. Coagulation Profile: No results for input(s): INR, PROTIME in the last 168 hours. Cardiac Enzymes: No results for input(s): CKTOTAL, CKMB, CKMBINDEX, TROPONINI in  the last 168 hours. BNP (last 3 results) No results for input(s): PROBNP in the last 8760 hours. HbA1C: No results for input(s): HGBA1C in the last 72 hours. CBG: Recent Labs  Lab 12/22/17 0035 12/22/17 0834  GLUCAP 109* 117*   Lipid Profile: No results for input(s): CHOL, HDL, LDLCALC, TRIG, CHOLHDL, LDLDIRECT in the last 72 hours. Thyroid Function Tests: No results for input(s): TSH, T4TOTAL, FREET4, T3FREE, THYROIDAB in the last 72 hours. Anemia Panel: No results for input(s): VITAMINB12, FOLATE, FERRITIN, TIBC, IRON, RETICCTPCT in the last 72 hours. Urine analysis:    Component Value Date/Time   COLORURINE YELLOW 12/21/2017 1038   APPEARANCEUR CLEAR 12/21/2017 1038   LABSPEC 1.010 12/21/2017 1038   PHURINE 6.5 12/21/2017 1038   GLUCOSEU NEGATIVE 12/21/2017 1038   HGBUR NEGATIVE 12/21/2017 [place] 12/21/2017 1038   KETONESUR NEGATIVE 12/21/2017 1038   PROTEINUR NEGATIVE 12/21/2017 1038   NITRITE NEGATIVE 12/21/2017 1038   LEUKOCYTESUR NEGATIVE 12/21/2017 1038     Jorgeluis Gurganus M.D. [facility] 12/22/2017, 10:23 AM  Pager: [phone] Between 7am to 7pm -  call Pager - [phone]  After 7pm go to [email or web address] - password [email or web address]  Call night coverage person covering after 7pm

## 2017-12-22 NOTE — Progress Notes (Signed)
Pt's med was not ordered  and I contacted the night attending via [place].

## 2017-12-23 ENCOUNTER — Inpatient Hospital Stay (HOSPITAL_COMMUNITY): Payer: Medicare Other

## 2017-12-23 DIAGNOSIS — K8000 Calculus of gallbladder with acute cholecystitis without obstruction: Secondary | ICD-10-CM

## 2017-12-23 DIAGNOSIS — I2510 Atherosclerotic heart disease of native coronary artery without angina pectoris: Secondary | ICD-10-CM

## 2017-12-23 DIAGNOSIS — Z95810 Presence of automatic (implantable) cardiac defibrillator: Secondary | ICD-10-CM

## 2017-12-23 DIAGNOSIS — I5022 Chronic systolic (congestive) heart failure: Secondary | ICD-10-CM

## 2017-12-23 DIAGNOSIS — I255 Ischemic cardiomyopathy: Secondary | ICD-10-CM

## 2017-12-23 LAB — COMPREHENSIVE METABOLIC PANEL
ALT: 24 U/L (ref 17–63)
ANION GAP: 10 (ref 5–15)
AST: 44 U/L — ABNORMAL HIGH (ref 15–41)
Albumin: 2.9 g/dL — ABNORMAL LOW (ref 3.5–5.0)
Alkaline Phosphatase: 22 U/L — ABNORMAL LOW (ref 38–126)
BILIRUBIN TOTAL: 1.4 mg/dL — AB (ref 0.3–1.2)
BUN: 34 mg/dL — AB (ref 6–20)
CHLORIDE: 102 mmol/L (ref 101–111)
CO2: 26 mmol/L (ref 22–32)
Calcium: 8.3 mg/dL — ABNORMAL LOW (ref 8.9–10.3)
Creatinine, Ser: 2 mg/dL — ABNORMAL HIGH (ref 0.61–1.24)
GFR, EST AFRICAN AMERICAN: 35 mL/min — AB (ref >60)
GFR, EST NON AFRICAN AMERICAN: 30 mL/min — AB (ref >60)
Glucose, Bld: 116 mg/dL — ABNORMAL HIGH (ref 65–99)
POTASSIUM: 4.2 mmol/L (ref 3.5–5.1)
Sodium: 138 mmol/L (ref 135–145)
TOTAL PROTEIN: 5.9 g/dL — AB (ref 6.5–8.1)

## 2017-12-23 LAB — GLUCOSE, CAPILLARY
GLUCOSE-CAPILLARY: 90 mg/dL (ref 65–99)
GLUCOSE-CAPILLARY: 93 mg/dL (ref 65–99)
GLUCOSE-CAPILLARY: 95 mg/dL (ref 65–99)

## 2017-12-23 LAB — CBC
HEMATOCRIT: 42.1 % (ref 39.0–52.0)
Hemoglobin: 14 g/dL (ref 13.0–17.0)
MCH: 31.2 pg (ref 26.0–34.0)
MCHC: 33.3 g/dL (ref 30.0–36.0)
MCV: 93.8 fL (ref 78.0–100.0)
PLATELETS: 135 10*3/uL — AB (ref 150–400)
RBC: 4.49 MIL/uL (ref 4.22–5.81)
RDW: 14.3 % (ref 11.5–15.5)
WBC: 17.6 10*3/uL — AB (ref 4.0–10.5)

## 2017-12-23 MED ORDER — TECHNETIUM TC 99M MEBROFENIN IV KIT
5.0000 | PACK | Freq: Once | INTRAVENOUS | Status: AC | PRN
  Administered 2017-12-23: 5 via INTRAVENOUS

## 2017-12-23 MED ORDER — MORPHINE SULFATE (PF) 4 MG/ML IV SOLN
INTRAVENOUS | Status: AC
  Filled 2017-12-23: qty 1

## 2017-12-23 MED ORDER — MORPHINE SULFATE (PF) 4 MG/ML IV SOLN
3.0000 mg | Freq: Once | INTRAVENOUS | Status: AC
  Administered 2017-12-23: 3 mg via INTRAVENOUS

## 2017-12-23 NOTE — Consult Note (Signed)
Cardiology Consultation:   Patient ID: [name]; [id number]; 30-Apr-1935   Admit date: 12/21/2017 Date of Consult: 12/23/2017  Primary Care Provider: Burnard Bunting, MD Primary Cardiologist: Dr. Acie Fredrickson Primary Electrophysiologist:  Dr. Caryl Comes   Patient Profile:   BRENTT Mora is a 82 y.o. male with a hx of CAD s/p remote MI in 1995 and s/p CABG, Ischemic Cardiomyopathy, chronic systolic HF w/ EF of 81-10%, h/o VT s/p ICD, atrial flutter (not on [place] 2/2 h/o subdural hematoma) HTN and HLD, who is being seen today for preoperative evaluation for possible cholecysectomy, in the setting of acute cholecystitis/cholelithiases, at the request of Dr. Tana Coast, Internal Medicine.  History of Present Illness:   Mr. [name] has known CAD. Remote MI in 1995 treated with CABG. Also ischemic CM, chronic systolic HF and h/o VT storm, requiring amiodarone and ICD. Last LHC in 2012 showed 4/5 patent bypass grafts. The LIMA to the ramus was atretic with no significant flow. SVG-mid LAD, SVG-PDA, SVG-Diag and SVG to the 2nd OM were patent. EF was 15-20% at that time. Continued medical therapy was recommended at that time. He is followed by Dr. Acie Fredrickson and Dr. Caryl Comes. Also h/o atrial fibrillation but not on [place] given h/o subdural hematoma.   Pt presented to the ED on 12/21/17 with CC of RUQ abdominal pain. Also met sepsis criteria with hypotension, leukocytosis, fever and AKI.  CT of the abdomen was showing features concerning for cystic duct stone. He was started on IVFs and antibiotics. Plan is for HIDA scan and if positive, plan is for cholecystectomy. Cardiology consulted for preoperative cardiovascular assessment.   2D Echo this admit shows EF of 25-30% w/ G1DD. No significant valvular abnormalities. Troponin minimally abnormal at 0.04. EKG shows SR, atrial paced.   He is very active. He walks 2-3 miles a day. Also lifts light weights and can ambulate 2 flights of stairs w/o exertional CP or dyspnea. No  syncope/ near syncope. No ICD shocks.    Past Medical History:  Diagnosis Date  . Atrial arrhythmia/a fibrillation    not on coumadin 2/2 subdural hematoma  . Automatic implantable cardiac defibrillator in situ    medtronic  . Automatic implantable cardioverter-defibrillator in situ   . CHF (congestive heart failure) ([place])   . Chronic systolic congestive heart failure ([place])   . Dysrhythmia   . Encounter for long-term (current) use of other medications    amiodarone  . H/O hiatal hernia   . Hyperlipidemia   . Hypertension   . Hypothyroidism   . Ischemic cardiomyopathy    status post CABG with patent vein grafts and an atretic LIMA to his ramus, January 2012  . Myocardial infarction [facility])    1995, non-STEMI January 2012  . Pacemaker   . Subdural hematoma ([facility])    on coumadin  . Thyroid disease    hypothyroid  . Ventricular fibrillation ([place])   . Ventricular tachycardia ([place])     VT Storm-January 2012- responded to Amiodarone    Past Surgical History:  Procedure Laterality Date  . bilateral cranitomies for sub hematomas    . BYPASS GRAFT  1995   5  . CORONARY ARTERY BYPASS GRAFT  1995   5  . IMPLANTABLE CARDIOVERTER DEFIBRILLATOR GENERATOR CHANGE N/A 03/28/2013   Procedure: IMPLANTABLE CARDIOVERTER DEFIBRILLATOR GENERATOR CHANGE;  Surgeon: Deboraha Sprang, MD;  Location: [facility] CATH LAB;  Service: Cardiovascular;  Laterality: N/A;  . INGUINAL HERNIA REPAIR Left 05/15/2014   Procedure: HERNIA REPAIR INGUINAL ADULT;  Surgeon: Zenovia Jarred, MD;  Location: [place];  Service: General;  Laterality: Left;  . INSERT / REPLACE / REMOVE PACEMAKER  14    generator repl   . INSERTION OF MESH Left 05/15/2014   Procedure: INSERTION OF MESH;  Surgeon: Zenovia Jarred, MD;  Location: [place];  Service: General;  Laterality: Left;  . TONSILLECTOMY       Home Medications:  Prior to Admission medications   Medication Sig Start Date End Date Taking? Authorizing Provider  amiodarone (PACERONE)  100 MG tablet Take 0.5 tablets (50 mg total) by mouth daily. 11/30/17  Yes Deboraha Sprang, MD  aspirin EC 81 MG tablet Take 81 mg by mouth daily.   Yes [provider]  b complex vitamins tablet Take 1 tablet by mouth daily.   Yes [provider]  carvedilol (COREG) 6.25 MG tablet TAKE 1 TABLET BY MOUTH 2 TIMES DAILY WITH A MEAL. Patient taking differently: TAKE 1 TABLET (6.25 MG) BY MOUTH 2 TIMES DAILY WITH A MEAL. 07/02/17  Yes Deboraha Sprang, MD  Coenzyme Q10 (COQ10 PO) Take 1 capsule by mouth daily.   Yes [provider]  donepezil (ARICEPT) 5 MG tablet Take 5 mg by mouth daily.    Yes [provider]  fenofibrate 160 MG tablet Take 1 tablet (160 mg total) by mouth daily. 09/17/17  Yes Nahser, Wonda Cheng, MD  lisinopril (PRINIVIL,ZESTRIL) 5 MG tablet Take 1 tablet (5 mg total) by mouth daily. 08/14/17  Yes Nahser, Wonda Cheng, MD  LYCOPENE PO Take 1 capsule by mouth daily.   Yes [provider]  Multiple Vitamin (MULTIVITAMIN WITH MINERALS) TABS tablet Take 1 tablet by mouth daily.   Yes [provider]  niacin (SLO-NIACIN) 500 MG tablet Take 500 mg by mouth daily.    Yes [provider]  nitroGLYCERIN (NITROSTAT) 0.4 MG SL tablet Place 1 tablet (0.4 mg total) under the tongue every 5 (five) minutes as needed for chest pain. For chest pain x 3 doses Patient taking differently: Place 0.4 mg under the tongue every 5 (five) minutes as needed for chest pain (max 3 doses).  03/22/13  Yes Deboraha Sprang, MD  OVER THE COUNTER MEDICATION Take 1 tablet by mouth daily. "prostrate essentials"   Yes [provider]  spironolactone (ALDACTONE) 25 MG tablet Take 1 tablet (25 mg total) by mouth daily. 11/30/17  Yes Nahser, Wonda Cheng, MD  Tamsulosin HCl (FLOMAX) 0.4 MG CAPS Take 0.4 mg by mouth at bedtime.  03/31/11  Yes [provider]  finasteride (PROSCAR) 5 MG tablet Take 5 mg by mouth daily. 04/01/16   [provider]    levothyroxine (SYNTHROID, LEVOTHROID) 75 MCG tablet Take 75 mcg by mouth daily. 12/11/17   [provider]    Inpatient Medications: Scheduled Meds: . amiodarone  50 mg Oral Daily  . donepezil  5 mg Oral Daily  . fenofibrate  160 mg Oral Daily  . finasteride  5 mg Oral Daily  . levothyroxine  75 mcg Oral QAC breakfast   Continuous Infusions: . sodium chloride 100 mL/hr at 12/22/17 0845  . piperacillin-tazobactam (ZOSYN)  IV 3.375 g (12/23/17 0702)  . vancomycin Stopped (12/22/17 1511)   PRN Meds: acetaminophen **OR** acetaminophen, HYDROmorphone (DILAUDID) injection, nitroGLYCERIN, ondansetron **OR** ondansetron (ZOFRAN) IV  Allergies:   No Known Allergies  Social History:   Social History   Socioeconomic History  . Marital status: Married    Spouse name: Not on  file  . Number of children: Not on file  . Years of education: Not on file  . Highest education level: Not on file  Social Needs  . Financial resource strain: Not on file  . Food insecurity - worry: Not on file  . Food insecurity - inability: Not on file  . Transportation needs - medical: Not on file  . Transportation needs - non-medical: Not on file  Occupational History  . Not on file  Tobacco Use  . Smoking status: Never Smoker  . Smokeless tobacco: Former Network engineer and Sexual Activity  . Alcohol use: No  . Drug use: No  . Sexual activity: Not on file  Other Topics Concern  . Not on file  Social History Narrative  . Not on file    Family History:    Family History  Problem Relation Age of Onset  . Cerebral aneurysm Father   . Hypertension Father      ROS:  Please see the history of present illness.  Review of Systems  Musculoskeletal: Joint swelling:     All other ROS reviewed and negative.     Physical Exam/Data:   Vitals:   12/22/17 1015 12/22/17 1344 12/22/17 2201 12/23/17 0531  BP: (!) 85/48 (!) 82/51 (!) 108/52 (!) 114/59  Pulse:  67 69 69  Resp:  _0 Temp:   99.2 F (37.3 C) 98.3 F (36.8 C) 98.8 F (37.1 C)  TempSrc:  Oral Oral Oral  SpO2: 95% 91% 93% 96%  Weight:      Height:        Intake/Output Summary (Last 24 hours) at 12/23/2017 1027 Last data filed at 12/23/2017 0803 Gross per 24 hour  Intake 0 ml  Output -  Net 0 ml   Filed Weights   12/21/17 0826 12/21/17 1843  Weight: 160 lb (72.6 kg) 207 lb 0.2 oz (93.9 kg)   Body mass index is 28.87 kg/m.  General:  Well nourished, well developed, in no acute distress HEENT: normal Lymph: no adenopathy Neck: no JVD Endocrine:  No thryomegaly Vascular: No carotid bruits; FA pulses 2+ bilaterally without bruits  Cardiac:  normal S1, S2; RRR; no murmur  Lungs:  clear to auscultation bilaterally, no wheezing, rhonchi or rales  Abd: soft, nontender, no hepatomegaly  Ext: no edema Musculoskeletal:  No deformities, BUE and BLE strength normal and equal Skin: warm and dry  Neuro:  CNs 2-12 intact, no focal abnormalities noted Psych:  Normal affect   EKG:  The EKG was personally reviewed and demonstrates:  SR, atrial paced Telemetry:  Telemetry was personally reviewed and demonstrates:  NSR, 3 beat run of NSVT  Relevant CV Studies: LHC 01/21/2011 ANGIOGRAPHIC FINDINGS: 1. The left main coronary artery was short and appeared to have a 40%     ostial stenosis. 2. The left anterior descending had a 100% proximal occlusion.  The     mid and distal vessel filled from the saphenous vein graft.  A     moderate-sized diagonal branch filled from the saphenous vein     graft. 3. The circumflex artery had proximal 30% stenosis and filled a large     obtuse marginal branch from native flow.  The AV groove circumflex     terminated in moderate-sized obtuse marginal branch that filled     both from native flow and from the saphenous vein graft. 4. The native right coronary artery had diffuse 80% stenosis     throughout  the proximal segment and 100% mid occlusion.  The distal     vessel and  the PDA filled from the saphenous vein graft. 5. The saphenous vein graft to the PDA is patent. 6. Saphenous vein graft to the mid LAD is patent. 7. Saphenous vein graft to the diagonal is patent. 8. Saphenous vein graft to the second obtuse marginal branch of the     circumflex artery is patent. 9. Left internal mammary artery graft to the ramus intermediate vessel     is small and atretic with no significant flow. 10.Left ventricular angiogram was performed in the RAO projection     which showed akinesis of the anterior wall and apex with ejection     fraction of 15-20%.  IMPRESSION: 1. Triple-vessel coronary artery disease, status post five-vessel     coronary artery bypass graft with 4/5 patent bypass grafts. 2. No indication currently for percutaneous coronary intervention. 3. Severe left ventricular systolic dysfunction.  RECOMMENDATIONS:  I recommend continued medical management at this time.  2D Echo 12/22/17 Study Conclusions  - Left ventricle: The cavity size was mildly dilated. Wall   thickness was normal. Systolic function was severely reduced. The   estimated ejection fraction was in the range of 25% to 30%. There   is akinesis of the mid-apicalanteroseptal, anterior,   anterolateral, lateral, inferolateral, and apical myocardium.   Doppler parameters are consistent with abnormal left ventricular   relaxation (grade 1 diastolic dysfunction). Acoustic contrast   opacification revealed no evidence ofthrombus. - Mitral valve: Moderately calcified annulus. - Left atrium: The atrium was severely dilated. - Right ventricle: Pacer wire or catheter noted in right ventricle.  Laboratory Data:  Chemistry Recent Labs  Lab 12/21/17 0836 12/22/17 0505 12/23/17 0512  NA 137 136 138  K 4.3 4.0 4.2  CL 104 104 102  CO2 25 20* 26  GLUCOSE 133* 123* 116*  BUN 17 21* 34*  CREATININE 1.14 2.54* 2.00*  CALCIUM 9.0 8.7* 8.3*  GFRNONAA 59* 22* 30*  GFRAA >60 26* 35*    ANIONGAP _0 Recent Labs  Lab 12/21/17 0836 12/22/17 0505 12/23/17 0512  PROT 6.5 6.1* 5.9*  ALBUMIN 3.7 3.3* 2.9*  AST 33 47* 44*  ALT 19 32 24  ALKPHOS 29* 24* 22*  BILITOT 0.7 1.7* 1.4*   Hematology Recent Labs  Lab 12/21/17 0908 12/22/17 0505 12/23/17 0512  WBC 9.9 23.2* 17.6*  RBC 4.62 4.74 4.49  HGB 14.5 15.1 14.0  HCT 42.3 44.7 42.1  MCV 91.6 94.3 93.8  MCH 31.4 31.9 31.2  MCHC 34.3 33.8 33.3  RDW 13.5 14.1 14.3  PLT 169 161 135*   Cardiac Enzymes Recent Labs  Lab 12/22/17 0934  TROPONINI 0.04*   No results for input(s): TROPIPOC in the last 168 hours.  BNPNo results for input(s): BNP, PROBNP in the last 168 hours.  DDimer No results for input(s): DDIMER in the last 168 hours.  Radiology/Studies:  Ct Abdomen Pelvis W Contrast  Result Date: 12/21/2017 CLINICAL DATA:  Abdominal distension since last night. Hernia repair. EXAM: CT ABDOMEN AND PELVIS WITH CONTRAST TECHNIQUE: Multidetector CT imaging of the abdomen and pelvis was performed using the standard protocol following bolus administration of intravenous contrast. CONTRAST:  131m ISOVUE-300 IOPAMIDOL (ISOVUE-300) INJECTION 61% COMPARISON:  None. FINDINGS: Lower chest: There is atelectasis in the lung bases with no suspicious infiltrates, nodules, or masses. Cardiomegaly is identified. Pacemaker leads are seen. Hepatobiliary: The left lobe of the liver is  relatively atrophic. No hepatic masses are seen. The portal vein is patent. The gallbladder is distended. There is mild increased attenuation in the fat along the undersurface of the gallbladder best seen on coronal images. Cholelithiasis is noted. There is suggestion of stones in the cystic duct on coronal imaging. No common bile duct stones are seen on this study. However, there is mild prominence of the intrahepatic bile ducts on today's study. Pancreas: Unremarkable. No pancreatic ductal dilatation or surrounding inflammatory changes. Spleen: Normal  in size without focal abnormality. Adrenals/Urinary Tract: Adrenal glands are normal. There is a cyst in the right kidney. No hydronephrosis or suspicious masses. No ureterectasis or ureteral stones. The bladder is normal. Stomach/Bowel: The stomach is normal. There is a duodenal diverticulum without evidence of inflammation. The small bowel is otherwise normal. Diffuse colonic diverticulosis is seen without focal diverticulitis. The appendix is well seen with no evidence of appendicitis. Vascular/Lymphatic: Atherosclerotic change is seen in the nonaneurysmal aorta, iliac vessels, and femoral vessels. No adenopathy. Reproductive: The prostate is enlarged. Other: No free air or free fluid. Mild increased attenuation in the fat of the abdomen may be due to volume overload. Musculoskeletal: No acute or significant osseous findings. IMPRESSION: 1. The gallbladder is distended and contains numerous stones. There is suggestion of stones in the cystic duct. There is mild fat stranding along the inferior surface of the gallbladder on coronal images. The findings are concerning for acute cholecystitis. Mild intrahepatic ductal prominence. Recommend clinical correlation and ultrasound. 2. Atherosclerotic change in the nonaneurysmal aorta. 3. Diverticulosis without focal diverticulitis. Electronically Signed   By: Dorise Bullion III M.D   On: 12/21/2017 09:59   US Abdomen Limited Ruq  Result Date: 12/21/2017 CLINICAL DATA:  Biliary colic. EXAM: ULTRASOUND ABDOMEN LIMITED RIGHT UPPER QUADRANT COMPARISON:  CT scan of same day. FINDINGS: Gallbladder: Cholelithiasis is noted with largest calculus measuring 2 cm. No significant gallbladder wall thickening or pericholecystic fluid is noted. No sonographic Murphy's sign is noted. Common bile duct: Diameter: 5.5 mm which is within normal limits. Liver: No focal lesion identified. Within normal limits in parenchymal echogenicity. Portal vein is patent on color Doppler imaging  with normal direction of blood flow towards the liver. IMPRESSION: Cholelithiasis is noted without definite evidence of cholecystitis. If there is clinical concern for cholecystitis, HIDA scan is recommended for further evaluation. Electronically Signed   By: Marijo Conception, M.D.   On: 12/21/2017 12:18    Assessment and Plan:   1. CAD: h/o CABG. Last LHC in 2012 showed 4/5 patent grafts. He is stable w/o anginal symptoms. No exertional CP or dyspnea. Continue medical therapy. BB on hold given sepsis/ hypotension.   2. Ischemic CM/ Chronic Systolic HF: EF 35-32 on recent echo. He has an ICD for primary prevention. Volume is stable. Monitor volume status closely during the perioperative period. He is getting IVFs currently.   3. Cholecystitis: pending HIDA scan. Possible cholecystectomy prior to discharge. Management per general surgery.   4. Pre-operative Evaluation/ Risk Assessment: this is a 82 y/o male with ischemic HD, CAD and chronic systolic HF. He denies anginal symptomatolgy. He is able to perform >4METs (walk a flight of stairs and > 4 blocks w/o exertional CP or dyspnea). His physical exam is benign. No murmur or carotid bruits. No active decompensated HF. 2D echo shows stable EF and no valvular abnormalities. EKG w/o ischemic abnormalities. Based on assessment, there is no indication for additional cardiac testing prior to surgery.   Based on  the Revised Cardiac Risk Index, this patient's estimated risk of adverse outcome with noncardiac surgery is Moderate. Estimated rate if MI, PE, VF, Cardiac Arrest or CHB is 6.6%.   We recommend continuation of ASA during the perioperative period. His BB has been on hold given sepsis/ hypotension. Not on a statin previously. Given his cardiac history, continue to monitor closely on telemetry and monitor volume status closely/ IVFs, given low EF to prevent acute CHF exacerbation. We will continue to follow along with you. MD to follow with further  recommendations.    For questions or updates, please contact [place] Please consult [email or web address] for contact info under Cardiology/STEMI.   Signed, Lyda Jester, PA-C  12/23/2017 10:27 AM   Personally seen and examined. Agree with above.  82 year old male taken care of by Dr. Acie Fredrickson as well as Dr. Caryl Comes with ischemic cardiomyopathy status post bypass with ejection fraction approximately 25% with ICD in place here with cholecystitis, mild hypotension currently getting his beta-blocker held.  Possibly may undergo cholecystectomy.  At home, he has been able to walk 2 miles daily.  2 flights of stairs without difficulty.  Able to complete greater than 4 METS of activity without any anginal symptoms or heart failure-like symptoms.  NYHA class I.   Exam: Alert and oriented, ruddy skin complexion, regular rate and rhythm, no JVD, no murmurs, lungs are clear normal respiratory effort, no edema, normal soft abdomen.   Assessment: Preoperative risk assessment, ischemic cardiomyopathy, cholecystitis   Agree with assessment above that he is of moderate risk from a cardiovascular perspective for general anesthesia and surgery and he may proceed.  Watch fluid administration.  Candee Furbish, MD

## 2017-12-23 NOTE — Progress Notes (Signed)
[facility]                                                                              Patient Demographics  [name], is a 82 y.o. male, DOB - 29-Sep-1935, VXB:[id number]  Admit date - 12/21/2017   Admitting Physician Desiree Hane, MD  Outpatient Primary MD for the patient is Burnard Bunting, MD  Outpatient specialists:   LOS - 2  days   Medical records reviewed and are as summarized below:    Chief Complaint  Patient presents with  . Abdominal Pain       Brief summary   Patient is a 82 year old male with history of CAD status post CABG, CHF status post AICD, history of atrial fibrillation on amiodarone, hypertension, atrial flutter, not on anticoagulation secondary to subdural hematoma presented with epigastric and right upper quadrant abdominal pain after dinner.  CT abdomen pelvis showed cystic duct stone, ultrasound did not show any definite signs of cholecystitis but has gallstones.  HIDA scan recommended. General surgery consulted.  Assessment & Plan    Principal Problem: Sepsis with cholecystitis/Cholelithiases -Patient met sepsis criteria at the time of admission with hypotension, leukocytosis, fever, acute kidney injury, source likely acute cholecystitis -BP improving, continue to hold Coreg, lisinopril, Spironolactone -Lactic acid, pro-calcitonin elevated on 12/25, hence vancomycin was added to IV Zosyn, de-escalate in 24 hours if patient remains stable.  MRSA PCR negative -Blood cultures negative so far, follow HIDA scan today. -General surgery following, plan for percutaneous drainage versus cholecystostomy -Cardiology consulted for preop clearance, EF of 25-30% with wall motion abn -Leukocytosis improving  Active Problems: Acute kidney injury -Creatinine 1.14 at the time of admission, increased to 2.5, possibly due to hypotension, sepsis.  Baseline creatinine 1.2 on 4/17 -Continue to hold Coreg, lisinopril, spironolactone,  patient was placed on IV fluid hydration due to persistent hypotension on 12/26.   -Creatinine now improving, 2.0 today, decrease IV fluids due to history of CHF    Ischemic cardiomyopathy, Chronic systolic and diastolic CHF (congestive heart failure) ([place]) -2D echo obtained 12/25 showed EF of 25-30% with akinesis of mid apical anteroseptal, anterior, anterolateral, lateral, inferolateral and apical myocardium, grade 1 diastolic dysfunction.   -BP now improving, decrease IV fluids. -Cardiology following    Atrial arrhythmia/a fibrillation -Currently rate controlled, hold Coreg, continue amiodarone -Not on anticoagulation secondary to history of subdural hematoma    ICD (implantable cardioverter-defibrillator) in place, CAD (coronary artery disease) -Currently no chest pain or acute shortness of breath -Cardiology following, 2D echo as above  History of V. Tach -ICD in place, continue on low-dose amiodarone  Hypothyroidism -Continue Synthroid, TSH 2.6  Code Status: Full CODE STATUS DVT Prophylaxis: SCD's Family Communication: Discussed in detail with the patient, all imaging results, lab results explained to the patient and daughter at the bedside   Disposition Plan:   Time Spent in minutes 25 minutes  Procedures:  CT abdomen RUQ Abdominal ultrasound  Consultants:   General surgery Cardiology  Antimicrobials:   IV Zosyn 12/24  IV vancomycin 12/25   Medications  Scheduled Meds: . amiodarone  50 mg Oral Daily  . donepezil  5 mg Oral Daily  . fenofibrate  160 mg Oral Daily  . finasteride  5 mg Oral Daily  . levothyroxine  75 mcg Oral QAC breakfast   Continuous Infusions: . sodium chloride 100 mL/hr at 12/22/17 0845  . piperacillin-tazobactam (ZOSYN)  IV 3.375 g (12/23/17 0702)  . vancomycin Stopped (12/22/17 1511)   PRN Meds:.acetaminophen **OR** acetaminophen, HYDROmorphone (DILAUDID) injection, nitroGLYCERIN, ondansetron **OR** ondansetron (ZOFRAN)  IV   Antibiotics   Anti-infectives (From admission, onward)   Start     Dose/Rate Route Frequency Ordered Stop   12/22/17 1300  vancomycin (VANCOCIN) IVPB 1000 mg/200 mL premix     1,000 mg 200 mL/hr over 60 Minutes Intravenous Every 24 hours 12/22/17 1257     12/21/17 2330  piperacillin-tazobactam (ZOSYN) IVPB 3.375 g     3.375 g 12.5 mL/hr over 240 Minutes Intravenous Every 8 hours 12/21/17 2302          Subjective:   [name] was seen and examined today.  BP improving today, sitting up in the chair, alert and oriented, daughter at the bedside.  Abdominal pain improving, no nausea, vomiting or no fevers.  Patient denies dizziness, chest pain, shortness of breath,  new weakness, numbess, tingling.   Objective:   Vitals:   12/22/17 1015 12/22/17 1344 12/22/17 2201 12/23/17 0531  BP: (!) 85/48 (!) 82/51 (!) 108/52 (!) 114/59  Pulse:  67 69 69  Resp:  '16 16 17  ' Temp:  99.2 F (37.3 C) 98.3 F (36.8 C) 98.8 F (37.1 C)  TempSrc:  Oral Oral Oral  SpO2: 95% 91% 93% 96%  Weight:      Height:        Intake/Output Summary (Last 24 hours) at 12/23/2017 1218 Last data filed at 12/23/2017 9450 Gross per 24 hour  Intake 0 ml  Output -  Net 0 ml     Wt Readings from Last 3 Encounters:  12/21/17 93.9 kg (207 lb 0.2 oz)  08/14/17 74 kg (163 lb 3.2 oz)  04/14/17 74.8 kg (165 lb)     Exam   General: Alert and oriented x 3, NAD  Eyes:   HEENT:  Atraumatic, normocephalic  Cardiovascular: S1 S2 clear. Regular rate and rhythm. No pedal edema b/l  Respiratory: Clear to auscultation bilaterally, no wheezing, rales or rhonchi  Gastrointestinal: Soft, minimal TTP right upper quadrant, nondistended, + bowel sounds  Ext: no pedal edema bilaterally  Neuro: no new deficits  Musculoskeletal: No digital cyanosis, clubbing  Skin: No rashes  Psych: Normal affect and demeanor, alert and oriented x3   Data Reviewed:  I have personally reviewed following labs and  imaging studies  Micro Results Recent Results (from the past 240 hour(s))  MRSA PCR Screening     Status: None   Collection Time: 12/22/17  1:47 PM  Result Value Ref Range Status   MRSA by PCR NEGATIVE NEGATIVE Final    Comment:        The GeneXpert MRSA Assay (FDA approved for NASAL specimens only), is one component of a comprehensive MRSA colonization surveillance program. It is not intended to diagnose MRSA infection nor to guide or monitor treatment for MRSA infections.     Radiology Reports Ct Abdomen Pelvis W Contrast  Result Date: 12/21/2017 CLINICAL DATA:  Abdominal distension since last night. Hernia repair. EXAM: CT ABDOMEN AND PELVIS WITH CONTRAST TECHNIQUE: Multidetector CT imaging of the abdomen and pelvis was performed using the standard protocol following bolus administration of intravenous contrast. CONTRAST:  183m ISOVUE-300 IOPAMIDOL (ISOVUE-300) INJECTION 61% COMPARISON:  None. FINDINGS: Lower chest: There is atelectasis in the lung bases with no suspicious infiltrates, nodules, or masses. Cardiomegaly is identified. Pacemaker leads are seen. Hepatobiliary: The left lobe of the liver is relatively atrophic. No hepatic masses are seen. The portal vein is patent. The gallbladder is distended. There is mild increased attenuation in the fat along the undersurface of the gallbladder best seen on coronal images. Cholelithiasis is noted. There is suggestion of stones in the cystic duct on coronal imaging. No common bile duct stones are seen on this study. However, there is mild prominence of the intrahepatic bile ducts on today's study. Pancreas: Unremarkable. No pancreatic ductal dilatation or surrounding inflammatory changes. Spleen: Normal in size without focal abnormality. Adrenals/Urinary Tract: Adrenal glands are normal. There is a cyst in the right kidney. No hydronephrosis or suspicious masses. No ureterectasis or ureteral stones. The bladder is normal. Stomach/Bowel:  The stomach is normal. There is a duodenal diverticulum without evidence of inflammation. The small bowel is otherwise normal. Diffuse colonic diverticulosis is seen without focal diverticulitis. The appendix is well seen with no evidence of appendicitis. Vascular/Lymphatic: Atherosclerotic change is seen in the nonaneurysmal aorta, iliac vessels, and femoral vessels. No adenopathy. Reproductive: The prostate is enlarged. Other: No free air or free fluid. Mild increased attenuation in the fat of the abdomen may be due to volume overload. Musculoskeletal: No acute or significant osseous findings. IMPRESSION: 1. The gallbladder is distended and contains numerous stones. There is suggestion of stones in the cystic duct. There is mild fat stranding along the inferior surface of the gallbladder on coronal images. The findings are concerning for acute cholecystitis. Mild intrahepatic ductal prominence. Recommend clinical correlation and ultrasound. 2. Atherosclerotic change in the nonaneurysmal aorta. 3. Diverticulosis without focal diverticulitis. Electronically Signed   By: DDorise BullionIII M.D   On: 12/21/2017 09:59   [place]Abdomen Limited Ruq  Result Date: 12/21/2017 CLINICAL DATA:  Biliary colic. EXAM: ULTRASOUND ABDOMEN LIMITED RIGHT UPPER QUADRANT COMPARISON:  CT scan of same day. FINDINGS: Gallbladder: Cholelithiasis is noted with largest calculus measuring 2 cm. No significant gallbladder wall thickening or pericholecystic fluid is noted. No sonographic Murphy's sign is noted. Common bile duct: Diameter: 5.5 mm which is within normal limits. Liver: No focal lesion identified. Within normal limits in parenchymal echogenicity. Portal vein is patent on color Doppler imaging with normal direction of blood flow towards the liver. IMPRESSION: Cholelithiasis is noted without definite evidence of cholecystitis. If there is clinical concern for cholecystitis, HIDA scan is recommended for further evaluation.  Electronically Signed   By: JMarijo Conception M.D.   On: 12/21/2017 12:18    Lab Data:  CBC: Recent Labs  Lab 12/21/17 0908 12/22/17 0505 12/23/17 0512  WBC 9.9 23.2* 17.6*  NEUTROABS 7.9*  --   --   HGB 14.5 15.1 14.0  HCT 42.3 44.7 42.1  MCV 91.6 94.3 93.8  PLT 169 161 1161   Basic Metabolic Panel: Recent Labs  Lab 12/21/17 0836 12/22/17 0505 12/23/17 0512  NA 137 136 138  K 4.3 4.0 4.2  CL 104 104 102  CO2 25 20* 26  GLUCOSE 133* 123* 116*  BUN 17 21* 34*  CREATININE 1.14 2.54* 2.00*  CALCIUM 9.0 8.7* 8.3*   GFR: Estimated Creatinine Clearance: 34.5 mL/min (A) (by C-G formula based on SCr of 2 mg/dL (H)). Liver Function Tests: Recent Labs  Lab 12/21/17 0836 12/22/17 0505 12/23/17 0512  AST 33  47* 44*  ALT 19 32 24  ALKPHOS 29* 24* 22*  BILITOT 0.7 1.7* 1.4*  PROT 6.5 6.1* 5.9*  ALBUMIN 3.7 3.3* 2.9*   Recent Labs  Lab 12/21/17 0836  LIPASE 25   No results for input(s): AMMONIA in the last 168 hours. Coagulation Profile: No results for input(s): INR, PROTIME in the last 168 hours. Cardiac Enzymes: Recent Labs  Lab 12/22/17 0934  TROPONINI 0.04*   BNP (last 3 results) No results for input(s): PROBNP in the last 8760 hours. HbA1C: No results for input(s): HGBA1C in the last 72 hours. CBG: Recent Labs  Lab 12/22/17 0035 12/22/17 0834 12/22/17 1554 12/23/17 0737  GLUCAP 109* 117* 149* 93   Lipid Profile: No results for input(s): CHOL, HDL, LDLCALC, TRIG, CHOLHDL, LDLDIRECT in the last 72 hours. Thyroid Function Tests: Recent Labs    12/22/17 0934  TSH 2.663   Anemia Panel: No results for input(s): VITAMINB12, FOLATE, FERRITIN, TIBC, IRON, RETICCTPCT in the last 72 hours. Urine analysis:    Component Value Date/Time   COLORURINE YELLOW 12/21/2017 1038   APPEARANCEUR CLEAR 12/21/2017 1038   LABSPEC 1.010 12/21/2017 1038   PHURINE 6.5 12/21/2017 1038   GLUCOSEU NEGATIVE 12/21/2017 1038   HGBUR NEGATIVE 12/21/2017 [place] 12/21/2017 1038   KETONESUR NEGATIVE 12/21/2017 1038   PROTEINUR NEGATIVE 12/21/2017 1038   NITRITE NEGATIVE 12/21/2017 1038   LEUKOCYTESUR NEGATIVE 12/21/2017 1038     Kalob Bergen M.D. [facility] 12/23/2017, 12:18 PM  Pager: [phone] Between 7am to 7pm - call Pager - [phone]  After 7pm go to [email or web address] - password [email or web address]  Call night coverage person covering after 7pm

## 2017-12-23 NOTE — Progress Notes (Signed)
[facility] Progress Note     Subjective: CC: abdominal pain  Patient with a little more abdominal pain today. Describes pain as a dull ache in lower abdomen, was more sharp last night. Denies n/v. Loose BM last night. UOP good. VSS.   Objective: Vital signs in last 24 hours: Temp:  [98.3 F (36.8 C)-99.2 F (37.3 C)] 98.8 F (37.1 C) (12/26 0531) Pulse Rate:  [67-69] 69 (12/26 0531) Resp:  [16-17] 17 (12/26 0531) BP: (82-114)/(51-59) 114/59 (12/26 0531) SpO2:  [91 %-96 %] 96 % (12/26 0531) Last BM Date: 12/20/17  Intake/Output from previous day: No intake/output data recorded. Intake/Output this shift: No intake/output data recorded.  PE: Gen:  Alert, NAD, pleasant Card:  Regular rate and rhythm, pedal pulses 2+ BL Pulm:  Normal effort, clear to auscultation bilaterally Abd: Soft, mildly tender in RUQ, non-distended, bowel sounds present, no HSM Skin: warm and dry, no rashes  Psych: A&Ox3   Lab Results:  Recent Labs    12/22/17 0505 12/23/17 0512  WBC 23.2* 17.6*  HGB 15.1 14.0  HCT 44.7 42.1  PLT 161 135*   BMET Recent Labs    12/22/17 0505 12/23/17 0512  NA 136 138  K 4.0 4.2  CL 104 102  CO2 20* 26  GLUCOSE 123* 116*  BUN 21* 34*  CREATININE 2.54* 2.00*  CALCIUM 8.7* 8.3*   PT/INR No results for input(s): LABPROT, INR in the last 72 hours. CMP     Component Value Date/Time   NA 138 12/23/2017 0512   NA 144 04/14/2017 1607   K 4.2 12/23/2017 0512   CL 102 12/23/2017 0512   CO2 26 12/23/2017 0512   GLUCOSE 116 (H) 12/23/2017 0512   BUN 34 (H) 12/23/2017 0512   BUN 17 04/14/2017 1607   CREATININE 2.00 (H) 12/23/2017 0512   CALCIUM 8.3 (L) 12/23/2017 0512   PROT 5.9 (L) 12/23/2017 0512   PROT 6.7 04/14/2017 1607   ALBUMIN 2.9 (L) 12/23/2017 0512   ALBUMIN 4.1 04/14/2017 1607   AST 44 (H) 12/23/2017 0512   ALT 24 12/23/2017 0512   ALKPHOS 22 (L) 12/23/2017 0512   BILITOT 1.4 (H) 12/23/2017 0512   BILITOT 0.5 04/14/2017 1607    GFRNONAA 30 (L) 12/23/2017 0512   GFRAA 35 (L) 12/23/2017 0512   Lipase     Component Value Date/Time   LIPASE 25 12/21/2017 0836       Studies/Results: US Abdomen Limited Ruq  Result Date: 12/21/2017 CLINICAL DATA:  Biliary colic. EXAM: ULTRASOUND ABDOMEN LIMITED RIGHT UPPER QUADRANT COMPARISON:  CT scan of same day. FINDINGS: Gallbladder: Cholelithiasis is noted with largest calculus measuring 2 cm. No significant gallbladder wall thickening or pericholecystic fluid is noted. No sonographic Murphy's sign is noted. Common bile duct: Diameter: 5.5 mm which is within normal limits. Liver: No focal lesion identified. Within normal limits in parenchymal echogenicity. Portal vein is patent on color Doppler imaging with normal direction of blood flow towards the liver. IMPRESSION: Cholelithiasis is noted without definite evidence of cholecystitis. If there is clinical concern for cholecystitis, HIDA scan is recommended for further evaluation. Electronically Signed   By: Marijo Conception, M.D.   On: 12/21/2017 12:18    Anti-infectives: Anti-infectives (From admission, onward)   Start     Dose/Rate Route Frequency Ordered Stop   12/22/17 1300  vancomycin (VANCOCIN) IVPB 1000 mg/200 mL premix     1,000 mg 200 mL/hr over 60 Minutes Intravenous Every 24 hours 12/22/17 1257  12/21/17 2330  piperacillin-tazobactam (ZOSYN) IVPB 3.375 g     3.375 g 12.5 mL/hr over 240 Minutes Intravenous Every 8 hours 12/21/17 2302         Assessment/Plan Ischemic cardiomyopathy, chronic systolic CHF AFib ICD in place/Hx of VT CAD Hypothyroidism  Abdominal pain  Cholelithiasis - [place]: Cholelithiasis is noted without definite evidence of cholecystitis. If there is clinical concern for cholecystitis, HIDA scan is recommended for further evaluation - HIDA pending - WBC 17.6, trending down; patient afebrile; mildly TTP in RUQ - cards consult pending - if HIDA is positive will consider cholecystectomy vs  percutaneous drainage  FEN: NPO, IVF VTE: SCDs ID: IV zosyn 12/24>>, IV vanc 12/25>>   LOS: 2 days    Brigid Re , [facility] 12/23/2017, 10:44 AM Pager: [phone] Consults: [phone] Mon-Fri 7:00 am-4:30 pm Sat-Sun 7:00 am-11:30 am

## 2017-12-23 NOTE — Progress Notes (Signed)
Patient being transported to [facility] for HIDA scan.  IV saline locked.  Patient A&OX4, family at bedside.

## 2017-12-24 DIAGNOSIS — N179 Acute kidney failure, unspecified: Secondary | ICD-10-CM | POA: Diagnosis present

## 2017-12-24 LAB — COMPREHENSIVE METABOLIC PANEL
ALBUMIN: 2.5 g/dL — AB (ref 3.5–5.0)
ALT: 21 U/L (ref 17–63)
ANION GAP: 10 (ref 5–15)
AST: 25 U/L (ref 15–41)
Alkaline Phosphatase: 24 U/L — ABNORMAL LOW (ref 38–126)
BILIRUBIN TOTAL: 1.5 mg/dL — AB (ref 0.3–1.2)
BUN: 22 mg/dL — ABNORMAL HIGH (ref 6–20)
CHLORIDE: 107 mmol/L (ref 101–111)
CO2: 20 mmol/L — ABNORMAL LOW (ref 22–32)
Calcium: 8.2 mg/dL — ABNORMAL LOW (ref 8.9–10.3)
Creatinine, Ser: 1.34 mg/dL — ABNORMAL HIGH (ref 0.61–1.24)
GFR calc Af Amer: 56 mL/min — ABNORMAL LOW (ref >60)
GFR calc non Af Amer: 48 mL/min — ABNORMAL LOW (ref >60)
GLUCOSE: 104 mg/dL — AB (ref 65–99)
POTASSIUM: 3.6 mmol/L (ref 3.5–5.1)
Sodium: 137 mmol/L (ref 135–145)
TOTAL PROTEIN: 5.5 g/dL — AB (ref 6.5–8.1)

## 2017-12-24 LAB — CBC
HEMATOCRIT: 39.8 % (ref 39.0–52.0)
Hemoglobin: 13.3 g/dL (ref 13.0–17.0)
MCH: 31 pg (ref 26.0–34.0)
MCHC: 33.4 g/dL (ref 30.0–36.0)
MCV: 92.8 fL (ref 78.0–100.0)
PLATELETS: 131 10*3/uL — AB (ref 150–400)
RBC: 4.29 MIL/uL (ref 4.22–5.81)
RDW: 14.2 % (ref 11.5–15.5)
WBC: 17.5 10*3/uL — AB (ref 4.0–10.5)

## 2017-12-24 LAB — GLUCOSE, CAPILLARY
Glucose-Capillary: 95 mg/dL (ref 65–99)
Glucose-Capillary: 88 mg/dL (ref 65–99)
Glucose-Capillary: 123 mg/dL — ABNORMAL HIGH (ref 65–99)

## 2017-12-24 MED ORDER — VANCOMYCIN HCL IN DEXTROSE 750-5 MG/150ML-% IV SOLN
750.0000 mg | Freq: Two times a day (BID) | INTRAVENOUS | Status: DC
  Filled 2017-12-24: qty 150

## 2017-12-24 NOTE — Progress Notes (Signed)
Pharmacy Antibiotic Note  [name] is a 82 y.o. male admitted on 12/21/2017 with abdominal pain.  Pharmacy has been consulted for vancomycin dosing for sepsis.  Patient is already on Zosyn per MD.  Patient's renal function is improving.  Afebrile, WBC down to 17.5, PCT 12, LA 2.6.   Plan: Change vanc to 750mg  IV Q12H.  F/U with narrowing. Zosyn EI 3.375gm IV Q8H per MD Monitor renal fxn, clinical progress, vanc trough as indicated   Height: 5\' 11"  (180.3 cm) Weight: 204 lb 2.3 oz (92.6 kg) IBW/kg (Calculated) : 75.3  Temp (24hrs), Avg:98.8 F (37.1 C), Min:98.4 F (36.9 C), Max:99.2 F (37.3 C)  Recent Labs  Lab 12/21/17 0836 12/21/17 0908 12/22/17 0505 12/22/17 0934 12/23/17 0512 12/24/17 0613  WBC  --  9.9 23.2*  --  17.6* 17.5*  CREATININE 1.14  --  2.54*  --  2.00* 1.34*  LATICACIDVEN  --   --   --  2.6*  --   --     Estimated Creatinine Clearance: 51.1 mL/min (A) (by C-G formula based on SCr of 1.34 mg/dL (H)).    No Known Allergies   Vanc 12/25 >> Zosyn 12/25 >>  12/25 BCx - NGTD 12/25 MRSA PCR - negative   [name] D. Mina Marble, PharmD, BCPS Pager:  [phone] 12/24/2017, 8:53 AM

## 2017-12-24 NOTE — Progress Notes (Signed)
[facility] Progress Note     Subjective: CC: abdominal pain  Nagging RUQ pain. Has decided to pursue lap chole.    Objective: Vital signs in last 24 hours: Temp:  [98.4 F (36.9 C)-99.2 F (37.3 C)] 99.2 F (37.3 C) (12/27 0523) Pulse Rate:  [73-75] 73 (12/27 0523) Resp:  [16-18] 16 (12/27 0523) BP: (116-131)/(62-70) 116/62 (12/27 0523) SpO2:  [94 %-95 %] 94 % (12/27 0523) Weight:  [92.6 kg (204 lb 2.3 oz)] 92.6 kg (204 lb 2.3 oz) (12/27 0729) Last BM Date: 12/21/17  Intake/Output from previous day: 12/26 0701 - 12/27 0700 In: 4018.3 [I.V.:3318.3; IV Piggyback:700] Out: -  Intake/Output this shift: No intake/output data recorded.  PE: Gen:  Alert, NAD, pleasant Card:  Regular rate and rhythm, pedal pulses 2+ BL Pulm:  Normal effort, clear to auscultation bilaterally Abd: Soft, mildly tender in RUQ, non-distended, bowel sounds present, no HSM Skin: warm and dry, no rashes  Psych: A&Ox3   Lab Results:  Recent Labs    12/23/17 0512 12/24/17 0613  WBC 17.6* 17.5*  HGB 14.0 13.3  HCT 42.1 39.8  PLT 135* 131*   BMET Recent Labs    12/23/17 0512 12/24/17 0613  NA 138 137  K 4.2 3.6  CL 102 107  CO2 26 20*  GLUCOSE 116* 104*  BUN 34* 22*  CREATININE 2.00* 1.34*  CALCIUM 8.3* 8.2*   PT/INR No results for input(s): LABPROT, INR in the last 72 hours. CMP     Component Value Date/Time   NA 137 12/24/2017 0613   NA 144 04/14/2017 1607   K 3.6 12/24/2017 0613   CL 107 12/24/2017 0613   CO2 20 (L) 12/24/2017 0613   GLUCOSE 104 (H) 12/24/2017 0613   BUN 22 (H) 12/24/2017 0613   BUN 17 04/14/2017 1607   CREATININE 1.34 (H) 12/24/2017 0613   CALCIUM 8.2 (L) 12/24/2017 0613   PROT 5.5 (L) 12/24/2017 0613   PROT 6.7 04/14/2017 1607   ALBUMIN 2.5 (L) 12/24/2017 0613   ALBUMIN 4.1 04/14/2017 1607   AST 25 12/24/2017 0613   ALT 21 12/24/2017 0613   ALKPHOS 24 (L) 12/24/2017 0613   BILITOT 1.5 (H) 12/24/2017 0613   BILITOT 0.5 04/14/2017 1607    GFRNONAA 48 (L) 12/24/2017 0613   GFRAA 56 (L) 12/24/2017 0613   Lipase     Component Value Date/Time   LIPASE 25 12/21/2017 0836       Studies/Results: Nm Hepatobiliary Liver Func  Addendum Date: 12/23/2017   ADDENDUM REPORT: 12/23/2017 16:06 ADDENDUM: These results were called by telephone at the time of interpretation on 12/23/2017 at 15:49 pm to Dr. Tana Coast , who verbally acknowledged these results. Electronically Signed   By: Fidela Salisbury M.D.   On: 12/23/2017 16:06   Result Date: 12/23/2017 CLINICAL DATA:  Right-sided abdominal pain.  History of gallstones. EXAM: NUCLEAR MEDICINE HEPATOBILIARY IMAGING TECHNIQUE: Sequential images of the abdomen were obtained out to 60 minutes following intravenous administration of radiopharmaceutical. RADIOPHARMACEUTICALS:  5.1 mCi Tc-44m Choletec IV. 3.0 mg morphine were given 90 at 90 minutes. COMPARISON:  Right upper quadrant ultrasound 12/21/2017 FINDINGS: Prompt uptake and biliary excretion of activity by the liver is seen. Large photopenic defect was seen overlying the right lobe of the liver. Gallbladder activity is not visualized, consistent with obstruction of the cystic duct. Biliary activity passes into small bowel, consistent with patent common bile duct. No significant change was seen after administration of morphine. IMPRESSION: Nonvisualization of the gallbladder,  consistent with obstruction at the cystic duct level. Findings usually associated with acute cholecystitis. Patent common bile duct. Large photopenic defect is seen overlying the right lobe of the liver. This may represent an enlarged photopenic gallbladder obscuring the right lobe of the liver, interposition of the transverse colon obscuring the right lobe of the liver, or potentially an intrinsic liver mass. Electronically Signed: By: Fidela Salisbury M.D. On: 12/23/2017 15:28    Anti-infectives: Anti-infectives (From admission, onward)   Start     Dose/Rate Route  Frequency Ordered Stop   12/22/17 1300  vancomycin (VANCOCIN) IVPB 1000 mg/200 mL premix     1,000 mg 200 mL/hr over 60 Minutes Intravenous Every 24 hours 12/22/17 1257     12/21/17 2330  piperacillin-tazobactam (ZOSYN) IVPB 3.375 g     3.375 g 12.5 mL/hr over 240 Minutes Intravenous Every 8 hours 12/21/17 2302         Assessment/Plan Ischemic cardiomyopathy, chronic systolic CHF AFib ICD in place/Hx of VT CAD Hypothyroidism  Abdominal pain  Cholelithiasis - [place]: Cholelithiasis is noted without definite evidence of cholecystitis. If there is clinical concern for cholecystitis, HIDA scan is recommended for further evaluation -Hida with nonvisualization of GB, also obscured right lobe of liver unknown significance - WBC 17.6, trending down; patient afebrile; mildly TTP in RUQ - cards - moderate risk, continue ASA    FEN: NPO, IVF VTE: SCDs ID: IV zosyn 12/24>>, IV vanc 12/25>>  Plan: Patient wishes to undergo cholecystectomy. Will discuss timing with on call surgeon, Dr. Dema Severin. Possible OR tomorrow.    LOS: 3 days    Clovis Riley , [facility] 12/24/2017, 8:41 AM

## 2017-12-24 NOTE — Progress Notes (Signed)
Progress Note    [name]  LDJ:[id number] DOB: 08-26-1935  DOA: 12/21/2017 PCP: Burnard Bunting, MD    Brief Narrative:   Chief complaint: F/U abdominal pain  Medical records reviewed and are as summarized below:  [name] is an 82 y.o. male with a PMH of CAD status post CABG, CHF status post AICD, history of atrial fibrillation on amiodarone, hypertension, atrial flutter, not on anticoagulation secondary to subdural hematoma who was admitted 12/21/17 for evaluation of epigastric and right upper quadrant abdominal pain after dinner.  CT abdomen/pelvis showed a cystic duct stone, ultrasound negative for cholecystitis but positive for gallstones.  HIDA scan with nonvisualization of gallbladder. General surgery has opted to pursue laparoscopic cholecystectomy.  Assessment/Plan:   Principal Problem:   Cholelithiases with clinical cholecystitis Laparoscopic cholecystectomy planned. Moderate risk for surgical complications. Continue IV Zosyn with cholecystectomy 12/25/17. Discontinue vancomycin.  Active Problems:   Acute kidney injury Presenting creatinine elevated at 2.54 with baseline creatinine around 1.1. Creatinine slowly improving. Continue to monitor.    Ischemic cardiomyopathy/chronic systolic CHF/history of V. tach/ICD in place EF 25-30 percent with akinesis of mid apical anteroseptal, anterior, anterolateral, lateral, inferolateral and apical myocardium, grade 1 diastolic dysfunction by echo done 12/22/17. Cardiologist following and cleared for surgery.    Atrial arrhythmia/a fibrillation Continue amiodarone. Not on blood thinners secondary to history of subdural hematoma. Heart rate controlled off Coreg.    CAD (coronary artery disease) Monitor closely post-operatively.    Hypothyroidism Continue Synthroid.  Body mass index is 28.47 kg/m.   Family Communication/Anticipated D/C date and plan/Code Status   DVT prophylaxis: SCDs ordered. Code  Status: Full Code.  Family Communication: Wife updated bedside. Disposition Plan: Home when stable postoperatively.   Medical Consultants:    Surgery   Anti-Infectives:    Vancomycin 12/22/17---> 12/24/17  Zosyn 12/21/17--->  Subjective:   Patient continued to report a "nagging" type abdominal pain. No nausea or vomiting. Feels a bit bloated. No chest pain or shortness of breath.  Objective:    Vitals:   12/23/17 0531 12/23/17 2201 12/24/17 0523 12/24/17 0729  BP: (!) 114/59 131/70 116/62   Pulse: 69 75 73   Resp: 17 18 16    Temp: 98.8 F (37.1 C) 98.4 F (36.9 C) 99.2 F (37.3 C)   TempSrc: Oral Oral Oral   SpO2: 96% 95% 94%   Weight:    92.6 kg (204 lb 2.3 oz)  Height:        Intake/Output Summary (Last 24 hours) at 12/24/2017 0849 Last data filed at 12/24/2017 0839 Gross per 24 hour  Intake 4018.33 ml  Output -  Net 4018.33 ml   Filed Weights   12/21/17 0826 12/21/17 1843 12/24/17 0729  Weight: 72.6 kg (160 lb) 93.9 kg (207 lb 0.2 oz) 92.6 kg (204 lb 2.3 oz)    Exam: General: No acute distress. Cardiovascular: Heart sounds show a regular rate, and rhythm. No gallops or rubs. No murmurs. No JVD. Lungs: Clear to auscultation bilaterally with good air movement. No rales, rhonchi or wheezes. Abdomen: Soft, tender in the right lower quadrant, nondistended with normal active bowel sounds. No masses. No hepatosplenomegaly. Neurological: Alert and oriented 3. Moves all extremities 4 with equal strength. Cranial nerves II through XII grossly intact. Skin: Warm and dry. No rashes or lesions. Extremities: No clubbing or cyanosis. No edema. Pedal pulses 2+. Psychiatric: Mood and affect are normal. Insight and judgment are normal.   Data Reviewed:   I  have personally reviewed following labs and imaging studies:  Labs: Labs show the following:   Basic Metabolic Panel: Recent Labs  Lab 12/21/17 0836 12/22/17 0505 12/23/17 0512 12/24/17 0613  NA 137  136 138 137  K 4.3 4.0 4.2 3.6  CL 104 104 102 107  CO2 25 20* 26 20*  GLUCOSE 133* 123* 116* 104*  BUN 17 21* 34* 22*  CREATININE 1.14 2.54* 2.00* 1.34*  CALCIUM 9.0 8.7* 8.3* 8.2*   GFR Estimated Creatinine Clearance: 51.1 mL/min (A) (by C-G formula based on SCr of 1.34 mg/dL (H)). Liver Function Tests: Recent Labs  Lab 12/21/17 0836 12/22/17 0505 12/23/17 0512 12/24/17 0613  AST 33 47* 44* 25  ALT 19 32 24 21  ALKPHOS 29* 24* 22* 24*  BILITOT 0.7 1.7* 1.4* 1.5*  PROT 6.5 6.1* 5.9* 5.5*  ALBUMIN 3.7 3.3* 2.9* 2.5*   Recent Labs  Lab 12/21/17 0836  LIPASE 25   CBC: Recent Labs  Lab 12/21/17 0908 12/22/17 0505 12/23/17 0512 12/24/17 0613  WBC 9.9 23.2* 17.6* 17.5*  NEUTROABS 7.9*  --   --   --   HGB 14.5 15.1 14.0 13.3  HCT 42.3 44.7 42.1 39.8  MCV 91.6 94.3 93.8 92.8  PLT 169 161 135* 131*   Cardiac Enzymes: Recent Labs  Lab 12/22/17 0934  TROPONINI 0.04*   CBG: Recent Labs  Lab 12/22/17 1554 12/23/17 0737 12/23/17 1610 12/23/17 2339 12/24/17 0800  GLUCAP 149* 93 95 90 95   Thyroid function studies: Recent Labs    12/22/17 0934  TSH 2.663   Sepsis Labs: Recent Labs  Lab 12/21/17 0908 12/22/17 0505 12/22/17 0934 12/23/17 0512 12/24/17 0613  PROCALCITON  --   --  12.38  --   --   WBC 9.9 23.2*  --  17.6* 17.5*  LATICACIDVEN  --   --  2.6*  --   --     Microbiology Recent Results (from the past 240 hour(s))  Culture, blood (routine x 2)     Status: None (Preliminary result)   Collection Time: 12/22/17  9:30 AM  Result Value Ref Range Status   Specimen Description BLOOD RIGHT ANTECUBITAL  Final   Special Requests   Final    BOTTLES DRAWN AEROBIC AND ANAEROBIC Blood Culture adequate volume   Culture NO GROWTH 1 DAY  Final   Report Status PENDING  Incomplete  Culture, blood (routine x 2)     Status: None (Preliminary result)   Collection Time: 12/22/17  9:45 AM  Result Value Ref Range Status   Specimen Description BLOOD RIGHT HAND   Final   Special Requests   Final    BOTTLES DRAWN AEROBIC AND ANAEROBIC Blood Culture adequate volume   Culture NO GROWTH 1 DAY  Final   Report Status PENDING  Incomplete  MRSA PCR Screening     Status: None   Collection Time: 12/22/17  1:47 PM  Result Value Ref Range Status   MRSA by PCR NEGATIVE NEGATIVE Final    Comment:        The GeneXpert MRSA Assay (FDA approved for NASAL specimens only), is one component of a comprehensive MRSA colonization surveillance program. It is not intended to diagnose MRSA infection nor to guide or monitor treatment for MRSA infections.     Procedures and diagnostic studies:  Nm Hepatobiliary Liver Func  Addendum Date: 12/23/2017   ADDENDUM REPORT: 12/23/2017 16:06 ADDENDUM: These results were called by telephone at the time of interpretation on 12/23/2017  at 15:49 pm to Dr. Tana Coast , who verbally acknowledged these results. Electronically Signed   By: Fidela Salisbury M.D.   On: 12/23/2017 16:06   Result Date: 12/23/2017 CLINICAL DATA:  Right-sided abdominal pain.  History of gallstones. EXAM: NUCLEAR MEDICINE HEPATOBILIARY IMAGING TECHNIQUE: Sequential images of the abdomen were obtained out to 60 minutes following intravenous administration of radiopharmaceutical. RADIOPHARMACEUTICALS:  5.1 mCi Tc-38m Choletec IV. 3.0 mg morphine were given 90 at 90 minutes. COMPARISON:  Right upper quadrant ultrasound 12/21/2017 FINDINGS: Prompt uptake and biliary excretion of activity by the liver is seen. Large photopenic defect was seen overlying the right lobe of the liver. Gallbladder activity is not visualized, consistent with obstruction of the cystic duct. Biliary activity passes into small bowel, consistent with patent common bile duct. No significant change was seen after administration of morphine. IMPRESSION: Nonvisualization of the gallbladder, consistent with obstruction at the cystic duct level. Findings usually associated with acute cholecystitis.  Patent common bile duct. Large photopenic defect is seen overlying the right lobe of the liver. This may represent an enlarged photopenic gallbladder obscuring the right lobe of the liver, interposition of the transverse colon obscuring the right lobe of the liver, or potentially an intrinsic liver mass. Electronically Signed: By: Fidela Salisbury M.D. On: 12/23/2017 15:28    Medications:   . amiodarone  50 mg Oral Daily  . donepezil  5 mg Oral Daily  . fenofibrate  160 mg Oral Daily  . finasteride  5 mg Oral Daily  . levothyroxine  75 mcg Oral QAC breakfast   Continuous Infusions: . sodium chloride 60 mL/hr at 12/24/17 0738  . piperacillin-tazobactam (ZOSYN)  IV Stopped (12/24/17 0430)  . vancomycin 1,000 mg (12/23/17 1627)     LOS: 3 days   Jacquelynn Cree  [facility] Pager [phone]. If unable to reach me by pager, please call my cell phone at [phone].  *Please refer to [email or web address], password [email or web address] to get updated schedule on who will round on this patient, as hospitalists switch teams weekly. If 7PM-7AM, please contact night-coverage at [email or web address], password [email or web address] for any overnight needs.  12/24/2017, 8:49 AM

## 2017-12-25 ENCOUNTER — Other Ambulatory Visit: Payer: Self-pay

## 2017-12-25 DIAGNOSIS — K810 Acute cholecystitis: Secondary | ICD-10-CM | POA: Insufficient documentation

## 2017-12-25 DIAGNOSIS — K8001 Calculus of gallbladder with acute cholecystitis with obstruction: Secondary | ICD-10-CM

## 2017-12-25 DIAGNOSIS — A419 Sepsis, unspecified organism: Secondary | ICD-10-CM

## 2017-12-25 DIAGNOSIS — I498 Other specified cardiac arrhythmias: Secondary | ICD-10-CM

## 2017-12-25 DIAGNOSIS — F0390 Unspecified dementia without behavioral disturbance: Secondary | ICD-10-CM

## 2017-12-25 LAB — COMPREHENSIVE METABOLIC PANEL
ALT: 23 U/L (ref 17–63)
AST: 28 U/L (ref 15–41)
Albumin: 2.5 g/dL — ABNORMAL LOW (ref 3.5–5.0)
Alkaline Phosphatase: 29 U/L — ABNORMAL LOW (ref 38–126)
Anion gap: 10 (ref 5–15)
BILIRUBIN TOTAL: 2 mg/dL — AB (ref 0.3–1.2)
BUN: 19 mg/dL (ref 6–20)
CHLORIDE: 108 mmol/L (ref 101–111)
CO2: 20 mmol/L — ABNORMAL LOW (ref 22–32)
CREATININE: 1.27 mg/dL — AB (ref 0.61–1.24)
Calcium: 8.4 mg/dL — ABNORMAL LOW (ref 8.9–10.3)
GFR calc Af Amer: 60 mL/min — ABNORMAL LOW (ref >60)
GFR, EST NON AFRICAN AMERICAN: 52 mL/min — AB (ref >60)
Glucose, Bld: 118 mg/dL — ABNORMAL HIGH (ref 65–99)
Potassium: 3.6 mmol/L (ref 3.5–5.1)
Sodium: 138 mmol/L (ref 135–145)
Total Protein: 5.7 g/dL — ABNORMAL LOW (ref 6.5–8.1)

## 2017-12-25 LAB — CBC
HEMATOCRIT: 42.4 % (ref 39.0–52.0)
Hemoglobin: 14.2 g/dL (ref 13.0–17.0)
MCH: 31.2 pg (ref 26.0–34.0)
MCHC: 33.5 g/dL (ref 30.0–36.0)
MCV: 93.2 fL (ref 78.0–100.0)
PLATELETS: 149 10*3/uL — AB (ref 150–400)
RBC: 4.55 MIL/uL (ref 4.22–5.81)
RDW: 14.4 % (ref 11.5–15.5)
WBC: 18.5 10*3/uL — AB (ref 4.0–10.5)

## 2017-12-25 LAB — GLUCOSE, CAPILLARY
GLUCOSE-CAPILLARY: 151 mg/dL — AB (ref 65–99)
Glucose-Capillary: 148 mg/dL — ABNORMAL HIGH (ref 65–99)

## 2017-12-25 LAB — LIPASE, BLOOD: LIPASE: 54 U/L — AB (ref 11–51)

## 2017-12-25 MED ORDER — METOPROLOL TARTRATE 5 MG/5ML IV SOLN
5.0000 mg | Freq: Once | INTRAVENOUS | Status: AC
  Administered 2017-12-25: 5 mg via INTRAVENOUS

## 2017-12-25 MED ORDER — METOPROLOL TARTRATE 5 MG/5ML IV SOLN
INTRAVENOUS | Status: AC
  Filled 2017-12-25: qty 5

## 2017-12-25 MED ORDER — DILTIAZEM LOAD VIA INFUSION
5.0000 mg | Freq: Once | INTRAVENOUS | Status: AC
  Administered 2017-12-25: 5 mg via INTRAVENOUS
  Filled 2017-12-25: qty 5

## 2017-12-25 MED ORDER — AMIODARONE LOAD VIA INFUSION: 150.0000 mg | Freq: Once | INTRAVENOUS | Status: DC

## 2017-12-25 MED ORDER — AMIODARONE IV BOLUS ONLY 150 MG/100ML
150.0000 mg | Freq: Once | INTRAVENOUS | Status: AC
  Administered 2017-12-25: 150 mg via INTRAVENOUS
  Filled 2017-12-25: qty 100

## 2017-12-25 MED ORDER — DILTIAZEM HCL 100 MG IV SOLR
5.0000 mg/h | INTRAVENOUS | Status: DC
  Administered 2017-12-25 – 2017-12-26 (×5): 15 mg/h via INTRAVENOUS
  Administered 2017-12-26 – 2017-12-27 (×3): 10 mg/h via INTRAVENOUS
  Filled 2017-12-25 (×10): qty 100

## 2017-12-25 NOTE — Plan of Care (Signed)
Pt has had no problems urinating. Pt has had adequate urine output this shift.

## 2017-12-25 NOTE — Progress Notes (Signed)
Dr. Baltazar Najjar called back, placed orders for one time dose 5mg  IV metoprolol. Said to administer and notify her of pt's HR after 27min.

## 2017-12-25 NOTE — Progress Notes (Addendum)
Dr. Baltazar Najjar text-paged f/u with pt's HR still 130s twenty minutes after IV Lopressor 5mg .

## 2017-12-25 NOTE — Progress Notes (Signed)
Pt transferred to 3M11 without event.

## 2017-12-25 NOTE — Progress Notes (Signed)
Progress Note  Patient Name: [name] Date of Encounter: 12/25/2017  Primary Cardiologist: Dr. Cathie Olden Primary Electrophysiologist:  Dr. Caryl Comes    Subjective   Patient reports feeling fine this AM. Disappointed in overnight events delaying surgery. Reports he did not feel his heart racing or palpitations overnight. Denies CP, SOB, palpitations this AM.   Inpatient Medications    Scheduled Meds: . amiodarone  50 mg Oral Daily  . donepezil  5 mg Oral Daily  . fenofibrate  160 mg Oral Daily  . finasteride  5 mg Oral Daily  . levothyroxine  75 mcg Oral QAC breakfast  . metoprolol tartrate       Continuous Infusions: . sodium chloride 60 mL/hr at 12/25/17 0022  . diltiazem (CARDIZEM) infusion 15 mg/hr (12/25/17 0602)  . piperacillin-tazobactam (ZOSYN)  IV 3.375 g (12/25/17 0657)   PRN Meds: acetaminophen **OR** acetaminophen, HYDROmorphone (DILAUDID) injection, nitroGLYCERIN, ondansetron **OR** ondansetron (ZOFRAN) IV   Vital Signs    Vitals:   12/25/17 0512 12/25/17 0540 12/25/17 0600 12/25/17 0753  BP: 101/71 121/87 98/76   Pulse: (!) 135 (!) 140 (!) 134   Resp:  (!) 24 (!) 27   Temp:  97.9 F (36.6 C)  97.9 F (36.6 C)  TempSrc:  Oral  Oral  SpO2:  98% 94%   Weight:  165 lb 2 oz (74.9 kg)    Height:  5\' 11"  (1.803 m)      Intake/Output Summary (Last 24 hours) at 12/25/2017 1001 Last data filed at 12/25/2017 0701 Gross per 24 hour  Intake 1080 ml  Output 275 ml  Net 805 ml   Filed Weights   12/21/17 1843 12/24/17 0729 12/25/17 0540  Weight: 207 lb 0.2 oz (93.9 kg) 204 lb 2.3 oz (92.6 kg) 165 lb 2 oz (74.9 kg)    Telemetry    Afib RVR with rate to 140s overnight. Now rate in 80s. - Personally Reviewed  ECG    Afib RVR with rate in 120s - Personally Reviewed  Physical Exam   GEN: Elderly male sitting upright in bed eating breakfast in no acute distress.   Neck: No JVD, no carotid bruits Cardiac: irregularly irregular, no murmurs, rubs, or  gallops.  Respiratory: Clear to auscultation bilaterally, no wheezes/ rales/ rhonchi GI: NABS, Soft, nontender, non-distended  MS: No edema; No deformity. Neuro:  Nonfocal, moving all extremities spontaneously Psych: Normal affect   Labs    Chemistry Recent Labs  Lab 12/23/17 0512 12/24/17 0613 12/25/17 0538  NA 138 137 138  K 4.2 3.6 3.6  CL 102 107 108  CO2 26 20* 20*  GLUCOSE 116* 104* 118*  BUN 34* 22* 19  CREATININE 2.00* 1.34* 1.27*  CALCIUM 8.3* 8.2* 8.4*  PROT 5.9* 5.5* 5.7*  ALBUMIN 2.9* 2.5* 2.5*  AST 44* 25 28  ALT 24 21 23   ALKPHOS 22* 24* 29*  BILITOT 1.4* 1.5* 2.0*  GFRNONAA 30* 48* 52*  GFRAA 35* 56* 60*  ANIONGAP 10 10 10      Hematology Recent Labs  Lab 12/23/17 0512 12/24/17 0613 12/25/17 0538  WBC 17.6* 17.5* 18.5*  RBC 4.49 4.29 4.55  HGB 14.0 13.3 14.2  HCT 42.1 39.8 42.4  MCV 93.8 92.8 93.2  MCH 31.2 31.0 31.2  MCHC 33.3 33.4 33.5  RDW 14.3 14.2 14.4  PLT 135* 131* 149*    Cardiac Enzymes Recent Labs  Lab 12/22/17 0934  TROPONINI 0.04*   No results for input(s): TROPIPOC in the last 168  hours.   BNPNo results for input(s): BNP, PROBNP in the last 168 hours.   DDimer No results for input(s): DDIMER in the last 168 hours.   Radiology    Nm Hepatobiliary Liver Func  Addendum Date: 12/23/2017   ADDENDUM REPORT: 12/23/2017 16:06 ADDENDUM: These results were called by telephone at the time of interpretation on 12/23/2017 at 15:49 pm to Dr. Tana Coast , who verbally acknowledged these results. Electronically Signed   By: Fidela Salisbury M.D.   On: 12/23/2017 16:06   Result Date: 12/23/2017 CLINICAL DATA:  Right-sided abdominal pain.  History of gallstones. EXAM: NUCLEAR MEDICINE HEPATOBILIARY IMAGING TECHNIQUE: Sequential images of the abdomen were obtained out to 60 minutes following intravenous administration of radiopharmaceutical. RADIOPHARMACEUTICALS:  5.1 mCi Tc-72m Choletec IV. 3.0 mg morphine were given 90 at 90 minutes.  COMPARISON:  Right upper quadrant ultrasound 12/21/2017 FINDINGS: Prompt uptake and biliary excretion of activity by the liver is seen. Large photopenic defect was seen overlying the right lobe of the liver. Gallbladder activity is not visualized, consistent with obstruction of the cystic duct. Biliary activity passes into small bowel, consistent with patent common bile duct. No significant change was seen after administration of morphine. IMPRESSION: Nonvisualization of the gallbladder, consistent with obstruction at the cystic duct level. Findings usually associated with acute cholecystitis. Patent common bile duct. Large photopenic defect is seen overlying the right lobe of the liver. This may represent an enlarged photopenic gallbladder obscuring the right lobe of the liver, interposition of the transverse colon obscuring the right lobe of the liver, or potentially an intrinsic liver mass. Electronically Signed: By: Fidela Salisbury M.D. On: 12/23/2017 15:28    Cardiac Studies   ECHO 12/22/17 Study Conclusions  - Left ventricle: The cavity size was mildly dilated. Wall   thickness was normal. Systolic function was severely reduced. The   estimated ejection fraction was in the range of 25% to 30%. There   is akinesis of the mid-apicalanteroseptal, anterior,   anterolateral, lateral, inferolateral, and apical myocardium.   Doppler parameters are consistent with abnormal left ventricular   relaxation (grade 1 diastolic dysfunction). Acoustic contrast   opacification revealed no evidence ofthrombus. - Mitral valve: Moderately calcified annulus. - Left atrium: The atrium was severely dilated. - Right ventricle: Pacer wire or catheter noted in right ventricle.  Patient Profile     [name] is a 82 y.o. male with a hx of CAD s/p remote MI in 1995 and s/p CABG, Ischemic Cardiomyopathy, chronic systolic HF w/ EF of 45-62%, h/o VT s/p ICD, atrial flutter (not on [place]illion 2/2 h/o subdural  hematoma) HTN and HLD, who was seen by Cardiology for preoperative evaluation for possible cholecysectomy, in the setting of acute cholecystitis/cholelithiases. Now with episode of Afib RVR overnight.     Assessment & Plan    1. Afib RVR: EKG showed sinus rhythm on admission. Overnight 12/25/17, patient found to have HR in 140s, telemetry and EKG confirming Afib RVR. Started on diltiazem gtt with improvement of HR to 80s.  - Would consider down-titration of diltiazem gtt given history of LV dysfunction (EF 25-30%) and up-titration of amiodarone (200mg  daily) for rhythm control.   - Not on anticoagulation given history of subdural hematoma. Certainly at higher risk of thrombotic event given recent conversion to Afib.   2. Ischemic CM/ Chronic Systolic HF:  - EF 56-38% on recent echo. He has an ICD for primary prevention. Volume is stable.  - Monitor volume status closely during the perioperative  period.  1. CAD: h/o CABG. Last LHC in 2012 showed 4/5 patent grafts. He is stable w/o anginal symptoms. No exertional CP or dyspnea.  - Continue medical therapy.  - BB on hold given sepsis/ hypotension.   3. Cholecystitis: Surgery following with plans for CCY 12/29 or 12/30 pending further cardiology recommendations/ risk assessment - Management per general surgery  4. Pre-operative Evaluation/ Risk Assessment: this is a 82 y/o male with ischemic HD, CAD and chronic systolic HF. He denies anginal symptomatolgy. He is able to perform >4METs (walk a flight of stairs and > 4 blocks w/o exertional CP or dyspnea). His physical exam is benign. No murmur or carotid bruits. No active decompensated HF. 2D echo shows stable EF and no valvular abnormalities. EKG w/o ischemic abnormalities. Based on assessment, there is no indication for additional cardiac testing prior to surgery.   Based on the Revised Cardiac Risk Index, this patient's estimated risk of adverse outcome with noncardiac surgery is Moderate.  Estimated rate if MI, PE, VF, Cardiac Arrest or CHB is 6.6%.   Based on my assessment, and given overnight events, patient remains moderate risk for surgery. Will discuss further with Dr. Johnsie Cancel.     For questions or updates, please contact [place] Please consult [email or web address] for contact info under Cardiology/STEMI.      Signed, Abigail Butts, PA-C  12/25/2017, 10:01 AM   [phone]  Patient examined chart reviewed. PAF currently being Rx with cardizem and amiodarone. Would continue iv Rx for both as best approach to avoid further PAF with rapid rates during surgery . He will be NPO post op anyway and will need iv cardiac meds. No anticoagulation given subdural history and need for surgery Exam with abdominal tenderness but no rebound no murmur clear lungs  Jenkins Rouge

## 2017-12-25 NOTE — Progress Notes (Signed)
Midlevel text-paged again, as pt is still in Afib RVR 140s. BP still stable, pt still asymptomatic. Will continue to monitor closely.

## 2017-12-25 NOTE — Progress Notes (Signed)
Pt transferred from 6N. Pt ambulated from 6N bed to 3M11 bed with no complications. Pt on cardizem gtt @ 5mg /hr, pts HR in 130s. Increased rate to 10mg /hr, waited 30 minutes and increased rate to 15 mg/hr with no effect on HR. On-call provider paged, 5mg  lopressor IV push ordered and administered. HR down to 80s-90s. Pt remains asymptomatic. Writer updated pt on his status and pts wife called and was updated as well. Will continue to monitor.

## 2017-12-25 NOTE — Progress Notes (Addendum)
Pt has had some confusion this evening. Around shift change (1930), pts son approached writer stating pt appeared to be somewhat confused repeatedly asking the same questions. Writer went to assess pt, pt was oriented.   Later pts wife called RN (around 2100) stating pt was confused. Pt wife said that he called her from his cell phone and said that we had moved him to the basement. Writer went to assess pt, pt stated he had just woke up and was disoriented. He heard talking outside his room and thought he had been moved. Pt reassured RN that he was just disoriented from waking up and now realizes that he had not been moved.   Around 2245, RN answered pts call bell. Pt stated he was worried because he could not find his truck keys. Writer reassured pt that his keys were at home with his wife. Pt stated he needed to go home. Writer reminded pt that he is supposed to have surgery tomorrow and that he needs to stay here tonight since it is almost 2300 and getting late. After some reassurance pt laid down and agreed to try to get some sleep and we would plan on having surgery tomorrow and talk about discharge plans after surgery. Will continue to monitor. On-call provider paged and notified.  2345: Writer went to get pts VS. Pt stated he wanted to apologize for earlier. Pt stated he had a nightmare and woke up worried about where his truck keys were. Pt stated he knows his truck and keys are at home. Pt continuing to ask to go home tonight and that he will come back tomorrow for surgery. Pt reminded him that it is 11:45 at night, and that doctors put in discharges this late. After some encouragement, pt agreed to stay the night but is adamant that he is ready to go.  0120: Pts bed alarm went off. Staff went into check on pt, pt stated he was sitting on the "porch because he saw some lights and was hoping someone would come to check on him and give him answers about his surgery." Pt stated he also called his wife,  unsure if he really did call his wife. Pt was reminded that it is after 1am and the surgeons are not in at this time and they will be in in the morning to discuss what time his surgery will take place. Pt laid back down in bed after reassurance. Will continue to monitor. On-call provider paged and notified. Provider states pt has hx of dementia and not to be concerned. Will continue to monitor.  0645: Pt called his wife stating [place]. Writer spoke to pts wife on the phone and assured her we have not moved him, he is still in the same room awaiting surgery. Pts wife very concerned stating she has never been told that he has dementia, just that he is forgetful at times. Pts wife states he has never acted confused like this and is frustrated that his surgery keeps getting pushed back. Writer reassured pts wife that sometimes pts do sometimes get confused being in the hospital for long periods of time out of their comfort zone and unfamiliar places. Pt has talked about missing his dog during admission. Writer suggested to wife about asking the doctor to allow her to bring his dog in for a visit since the dog has been a therapy dog in the past. May want to also suggest to do pts surgery on  an outpatient basis if pt is not able to have surgery done today and is not in any acute pain and is stable. Report passed on to next shift.  Clint Bolder, RN 12/25/17 11:14 PM

## 2017-12-25 NOTE — Progress Notes (Signed)
Midlevel text-paged regarding new onset afib RVR 120s-140s. 12-lead obtained. Asymptomatic, BP stable.   Awaiting orders.

## 2017-12-25 NOTE — Care Management Important Message (Signed)
Important Message  Patient Details  Name: [name] MRN: [id number] Date of Birth: 1935-08-18   Medicare Important Message Given:  Yes    Nathen May 12/25/2017, 9:34 AM

## 2017-12-25 NOTE — Progress Notes (Signed)
Called cardiology to clarify administer amiodarone 150mg  IV bolus along with Cardizem gtt at 15. Patients HR noted 70 currently prior to bolus. Ordered to administer bolus.

## 2017-12-25 NOTE — Progress Notes (Signed)
[facility] Progress Note     Subjective: CC: abdominal pain  Nagging RUQ pain improved Afib/RVR overnight necessitating transfer to step down. Denies CP/SOB. Started on diltiazem gtt and rate now <100. Denies defibrillator going off  Objective: Vital signs in last 24 hours: Temp:  [97.9 F (36.6 C)-99.3 F (37.4 C)] 97.9 F (36.6 C) (12/28 0753) Pulse Rate:  [71-140] 134 (12/28 0600) Resp:  [16-27] 27 (12/28 0600) BP: (98-124)/(61-87) 98/76 (12/28 0600) SpO2:  [92 %-98 %] 94 % (12/28 0600) Weight:  [74.9 kg (165 lb 2 oz)] 74.9 kg (165 lb 2 oz) (12/28 0540) Last BM Date: 12/21/17  Intake/Output from previous day: 12/27 0701 - 12/28 0700 In: 1990 [I.V.:1990] Out: 150 [Urine:150] Intake/Output this shift: Total I/O In: -  Out: 125 [Urine:125]  PE: Gen:  Alert, NAD, pleasant Card:  Regular rate and rhythm, pedal pulses 2+ BL Pulm:  Normal effort, clear to auscultation bilaterally Abd: Soft, mildly tender in RUQ, non-distended, bowel sounds present, no HSM Skin: warm and dry, no rashes  Psych: A&Ox3   Lab Results:  Recent Labs    12/24/17 0613 12/25/17 0538  WBC 17.5* 18.5*  HGB 13.3 14.2  HCT 39.8 42.4  PLT 131* 149*   BMET Recent Labs    12/24/17 0613 12/25/17 0538  NA 137 138  K 3.6 3.6  CL 107 108  CO2 20* 20*  GLUCOSE 104* 118*  BUN 22* 19  CREATININE 1.34* 1.27*  CALCIUM 8.2* 8.4*   PT/INR No results for input(s): LABPROT, INR in the last 72 hours. CMP     Component Value Date/Time   NA 138 12/25/2017 0538   NA 144 04/14/2017 1607   K 3.6 12/25/2017 0538   CL 108 12/25/2017 0538   CO2 20 (L) 12/25/2017 0538   GLUCOSE 118 (H) 12/25/2017 0538   BUN 19 12/25/2017 0538   BUN 17 04/14/2017 1607   CREATININE 1.27 (H) 12/25/2017 0538   CALCIUM 8.4 (L) 12/25/2017 0538   PROT 5.7 (L) 12/25/2017 0538   PROT 6.7 04/14/2017 1607   ALBUMIN 2.5 (L) 12/25/2017 0538   ALBUMIN 4.1 04/14/2017 1607   AST 28 12/25/2017 0538   ALT 23  12/25/2017 0538   ALKPHOS 29 (L) 12/25/2017 0538   BILITOT 2.0 (H) 12/25/2017 0538   BILITOT 0.5 04/14/2017 1607   GFRNONAA 52 (L) 12/25/2017 0538   GFRAA 60 (L) 12/25/2017 0538   Lipase     Component Value Date/Time   LIPASE 54 (H) 12/25/2017 0538       Studies/Results: Nm Hepatobiliary Liver Func  Addendum Date: 12/23/2017   ADDENDUM REPORT: 12/23/2017 16:06 ADDENDUM: These results were called by telephone at the time of interpretation on 12/23/2017 at 15:49 pm to Dr. Tana Coast , who verbally acknowledged these results. Electronically Signed   By: Fidela Salisbury M.D.   On: 12/23/2017 16:06   Result Date: 12/23/2017 CLINICAL DATA:  Right-sided abdominal pain.  History of gallstones. EXAM: NUCLEAR MEDICINE HEPATOBILIARY IMAGING TECHNIQUE: Sequential images of the abdomen were obtained out to 60 minutes following intravenous administration of radiopharmaceutical. RADIOPHARMACEUTICALS:  5.1 mCi Tc-77m Choletec IV. 3.0 mg morphine were given 90 at 90 minutes. COMPARISON:  Right upper quadrant ultrasound 12/21/2017 FINDINGS: Prompt uptake and biliary excretion of activity by the liver is seen. Large photopenic defect was seen overlying the right lobe of the liver. Gallbladder activity is not visualized, consistent with obstruction of the cystic duct. Biliary activity passes into small bowel, consistent with patent common  bile duct. No significant change was seen after administration of morphine. IMPRESSION: Nonvisualization of the gallbladder, consistent with obstruction at the cystic duct level. Findings usually associated with acute cholecystitis. Patent common bile duct. Large photopenic defect is seen overlying the right lobe of the liver. This may represent an enlarged photopenic gallbladder obscuring the right lobe of the liver, interposition of the transverse colon obscuring the right lobe of the liver, or potentially an intrinsic liver mass. Electronically Signed: By: Fidela Salisbury  M.D. On: 12/23/2017 15:28    Anti-infectives: Anti-infectives (From admission, onward)   Start     Dose/Rate Route Frequency Ordered Stop   12/24/17 0900  vancomycin (VANCOCIN) IVPB 750 mg/150 ml premix  Status:  Discontinued     750 mg 150 mL/hr over 60 Minutes Intravenous Every 12 hours 12/24/17 0852 12/24/17 0854   12/22/17 1300  vancomycin (VANCOCIN) IVPB 1000 mg/200 mL premix  Status:  Discontinued     1,000 mg 200 mL/hr over 60 Minutes Intravenous Every 24 hours 12/22/17 1257 12/24/17 0852   12/21/17 2330  piperacillin-tazobactam (ZOSYN) IVPB 3.375 g     3.375 g 12.5 mL/hr over 240 Minutes Intravenous Every 8 hours 12/21/17 2302         Assessment/Plan Ischemic cardiomyopathy, chronic systolic CHF AFib ICD in place/Hx of VT CAD Hypothyroidism  Abdominal pain  Cholelithiasis - [place]: Cholelithiasis is noted without definite evidence of cholecystitis. If there is clinical concern for cholecystitis, HIDA scan is recommended for further evaluation -Hida with nonvisualization of GB, also obscured right lobe of liver unknown significance - WBC 17.6, trending down; patient afebrile; mildly TTP in RUQ - cards - moderate risk, continue ASA    FEN: NPO, IVF VTE: SCDs ID: IV zosyn 12/24>>, IV vanc 12/25>>  Plan: -Cardiology re-evaluation -Patient has requested to proceed with surgery for lap chole as opposed to c-tube given that he has received cardaic clearance prior to the most recent event -Possible OR tomorrow or Sunday if cleared by cardiology -Diet as tolerated today, NPO after midnight; continue IV abx   LOS: 4 days   Ileana Roup , [facility] 12/25/2017, 8:49 AM

## 2017-12-25 NOTE — Progress Notes (Signed)
  PROGRESS NOTE  [name] ITG:[id number] DOB: 07/06/1935 DOA: 12/21/2017 PCP: Burnard Bunting, MD  Brief Narrative: 4yom PMH CAD, ICM, afib prsetend with abd pain after a heavy meal. CTs showed gallstones. Admitted for cholelithasis, sepsis, cholecystitis. Seen by surgery.  Assessment/Plan Sepsis POA secondary to cholecystitis. HIDA c/w cholecystitis. - continue abx. - management per surgery, possible cholecystectomy 12/29.  Afib, known, not on anticoagulation secondary SDH. - amiodarone, diltiazem per cardiology  AKI secondary to hypotension, sepsis - resolved  CAD, CABG, ICM chronic systolic CHF, ICD, VT on amiodarone.  Hypothyroidism - stable; continue levothyroxine  Dementia - stable; continue donepezil  DVT prophylaxis: SCDs Code Status: full Family Communication: at bedside Disposition Plan: home    [name], MD  [facility] Direct contact: [phone] --[address]ia [place]  --[email or web address]; password [email or web address]  7PM-7AM contact night coverage as above 12/25/2017, 1:29 PM  LOS: 4 days   Consultants:  Cardiology   Procedures:    Antimicrobials:  Vancomycin 12/22/17---> 12/24/17  Zosyn 12/21/17--->  Interval history/Subjective: Afib/RVR overnight.  Feels fine. Mild lower abd pain.  Objective: Vitals:  Vitals:   12/25/17 0753 12/25/17 1246  BP:  112/66  Pulse:  66  Resp:  (!) 22  Temp: 97.9 F (36.6 C) (!) 97.5 F (36.4 C)  SpO2:  96%    Exam:  Constitutional:  . Appears calm and comfortable Eyes:  . pupils and irises appear normal . Normal lids  ENMT:  . grossly normal hearing  . Lips appear normal Respiratory:  . CTA bilaterally, no w/r/r.  . Respiratory effort normal.  Cardiovascular:  . RRR, no m/r/g . No LE extremity edema   Skin:  . No rashes, lesions, ulcers . palpation of skin: no induration or nodules Psychiatric:  . Mental status o Mood, affect appropriate . judgement and insight appear  normal   I have personally reviewed the following:   Labs:  BMP unremarkable  WBC without significant change, 18.5  Medical tests:  EKG wide complex tachycardia   Scheduled Meds: . donepezil  5 mg Oral Daily  . fenofibrate  160 mg Oral Daily  . finasteride  5 mg Oral Daily  . levothyroxine  75 mcg Oral QAC breakfast  . metoprolol tartrate       Continuous Infusions: . sodium chloride 60 mL/hr at 12/25/17 0022  . diltiazem (CARDIZEM) infusion 15 mg/hr (12/25/17 1003)  . piperacillin-tazobactam (ZOSYN)  IV Stopped (12/25/17 1057)    Principal Problem:   Acute cholecystitis Active Problems:   Ischemic cardiomyopathy   Chronic systolic CHF (congestive heart failure) (HCC)   Atrial arrhythmia/a fibrillation   ICD (implantable cardioverter-defibrillator) in place   CAD (coronary artery disease)   Cholelithiasis   AKI (acute kidney injury) ([place])   Sepsis ([place])   Dementia   LOS: 4 days

## 2017-12-26 ENCOUNTER — Encounter (HOSPITAL_COMMUNITY): Payer: Self-pay | Admitting: Certified Registered Nurse Anesthetist

## 2017-12-26 ENCOUNTER — Inpatient Hospital Stay (HOSPITAL_COMMUNITY): Payer: Medicare Other | Admitting: Certified Registered"

## 2017-12-26 ENCOUNTER — Encounter (HOSPITAL_COMMUNITY)
Admission: EM | Disposition: A | Discharge status: Home Health | Payer: Self-pay | Source: Home / Self Care | Attending: Family Medicine

## 2017-12-26 HISTORY — PX: CHOLECYSTECTOMY: SHX55

## 2017-12-26 LAB — BASIC METABOLIC PANEL
ANION GAP: 8 (ref 5–15)
BUN: 16 mg/dL (ref 6–20)
CALCIUM: 8 mg/dL — AB (ref 8.9–10.3)
CO2: 22 mmol/L (ref 22–32)
Chloride: 109 mmol/L (ref 101–111)
Creatinine, Ser: 1.07 mg/dL (ref 0.61–1.24)
GLUCOSE: 111 mg/dL — AB (ref 65–99)
Potassium: 3.2 mmol/L — ABNORMAL LOW (ref 3.5–5.1)
Sodium: 139 mmol/L (ref 135–145)

## 2017-12-26 LAB — SURGICAL PCR SCREEN
MRSA, PCR: NEGATIVE
STAPHYLOCOCCUS AUREUS: NEGATIVE

## 2017-12-26 LAB — GLUCOSE, CAPILLARY
GLUCOSE-CAPILLARY: 133 mg/dL — AB (ref 65–99)
GLUCOSE-CAPILLARY: 137 mg/dL — AB (ref 65–99)
Glucose-Capillary: 122 mg/dL — ABNORMAL HIGH (ref 65–99)
Glucose-Capillary: 152 mg/dL — ABNORMAL HIGH (ref 65–99)

## 2017-12-26 SURGERY — LAPAROSCOPIC CHOLECYSTECTOMY
Anesthesia: General | Site: Abdomen

## 2017-12-26 MED ORDER — PHENYLEPHRINE HCL 10 MG/ML IJ SOLN
INTRAMUSCULAR | Status: DC | PRN
  Administered 2017-12-26 (×4): 40 ug via INTRAVENOUS

## 2017-12-26 MED ORDER — PROPOFOL 10 MG/ML IV BOLUS
INTRAVENOUS | Status: AC
  Filled 2017-12-26: qty 20

## 2017-12-26 MED ORDER — ONDANSETRON HCL 4 MG/2ML IJ SOLN
INTRAMUSCULAR | Status: AC
  Filled 2017-12-26: qty 2

## 2017-12-26 MED ORDER — ROCURONIUM BROMIDE 10 MG/ML (PF) SYRINGE
PREFILLED_SYRINGE | INTRAVENOUS | Status: DC | PRN
  Administered 2017-12-26: 40 mg via INTRAVENOUS
  Administered 2017-12-26: 10 mg via INTRAVENOUS

## 2017-12-26 MED ORDER — SODIUM CHLORIDE 0.9 % IR SOLN
Status: DC | PRN
  Administered 2017-12-26 (×2): 1000 mL

## 2017-12-26 MED ORDER — FENTANYL CITRATE (PF) 100 MCG/2ML IJ SOLN
INTRAMUSCULAR | Status: DC | PRN
  Administered 2017-12-26: 25 ug via INTRAVENOUS
  Administered 2017-12-26: 50 ug via INTRAVENOUS
  Administered 2017-12-26: 25 ug via INTRAVENOUS
  Administered 2017-12-26: 50 ug via INTRAVENOUS

## 2017-12-26 MED ORDER — DEXAMETHASONE SODIUM PHOSPHATE 10 MG/ML IJ SOLN
INTRAMUSCULAR | Status: AC
  Filled 2017-12-26: qty 1

## 2017-12-26 MED ORDER — OXYCODONE HCL 5 MG/5ML PO SOLN: 5.0000 mg | Freq: Once | ORAL | Status: DC | PRN

## 2017-12-26 MED ORDER — LIDOCAINE 2% (20 MG/ML) 5 ML SYRINGE
INTRAMUSCULAR | Status: DC | PRN
  Administered 2017-12-26: 60 mg via INTRAVENOUS
  Administered 2017-12-26: 40 mg via INTRAVENOUS

## 2017-12-26 MED ORDER — NEOSTIGMINE METHYLSULFATE 5 MG/5ML IV SOSY
PREFILLED_SYRINGE | INTRAVENOUS | Status: AC
  Filled 2017-12-26: qty 5

## 2017-12-26 MED ORDER — FENTANYL CITRATE (PF) 100 MCG/2ML IJ SOLN
25.0000 ug | INTRAMUSCULAR | Status: DC | PRN
  Administered 2017-12-26: 50 ug via INTRAVENOUS

## 2017-12-26 MED ORDER — LIDOCAINE 2% (20 MG/ML) 5 ML SYRINGE
INTRAMUSCULAR | Status: AC
  Filled 2017-12-26: qty 5

## 2017-12-26 MED ORDER — HEMOSTATIC AGENTS (NO CHARGE) OPTIME
TOPICAL | Status: DC | PRN
  Administered 2017-12-26: 1 via TOPICAL

## 2017-12-26 MED ORDER — BUPIVACAINE-EPINEPHRINE (PF) 0.25% -1:200000 IJ SOLN
INTRAMUSCULAR | Status: AC
  Filled 2017-12-26: qty 30

## 2017-12-26 MED ORDER — OXYCODONE HCL 5 MG PO TABS: 5.0000 mg | ORAL_TABLET | Freq: Once | ORAL | Status: DC | PRN

## 2017-12-26 MED ORDER — 0.9 % SODIUM CHLORIDE (POUR BTL) OPTIME
TOPICAL | Status: DC | PRN
  Administered 2017-12-26: 1000 mL

## 2017-12-26 MED ORDER — ROCURONIUM BROMIDE 10 MG/ML (PF) SYRINGE
PREFILLED_SYRINGE | INTRAVENOUS | Status: AC
  Filled 2017-12-26: qty 5

## 2017-12-26 MED ORDER — GLYCOPYRROLATE 0.2 MG/ML IV SOSY
PREFILLED_SYRINGE | INTRAVENOUS | Status: DC | PRN
  Administered 2017-12-26: 0.6 mg via INTRAVENOUS

## 2017-12-26 MED ORDER — IOPAMIDOL (ISOVUE-300) INJECTION 61%
INTRAVENOUS | Status: AC
  Filled 2017-12-26: qty 50

## 2017-12-26 MED ORDER — PROPOFOL 10 MG/ML IV BOLUS
INTRAVENOUS | Status: DC | PRN
  Administered 2017-12-26: 90 mg via INTRAVENOUS
  Administered 2017-12-26 (×2): 20 mg via INTRAVENOUS

## 2017-12-26 MED ORDER — LACTATED RINGERS IV SOLN
INTRAVENOUS | Status: DC
  Administered 2017-12-26 (×2): via INTRAVENOUS

## 2017-12-26 MED ORDER — BUPIVACAINE-EPINEPHRINE 0.25% -1:200000 IJ SOLN
INTRAMUSCULAR | Status: DC | PRN
  Administered 2017-12-26: 20 mL

## 2017-12-26 MED ORDER — FENTANYL CITRATE (PF) 100 MCG/2ML IJ SOLN
INTRAMUSCULAR | Status: AC
  Filled 2017-12-26: qty 2

## 2017-12-26 MED ORDER — DEXAMETHASONE SODIUM PHOSPHATE 10 MG/ML IJ SOLN
INTRAMUSCULAR | Status: DC | PRN
  Administered 2017-12-26: 10 mg via INTRAVENOUS

## 2017-12-26 MED ORDER — NEOSTIGMINE METHYLSULFATE 5 MG/5ML IV SOSY
PREFILLED_SYRINGE | INTRAVENOUS | Status: DC | PRN
  Administered 2017-12-26: 4 mg via INTRAVENOUS

## 2017-12-26 MED ORDER — FENTANYL CITRATE (PF) 250 MCG/5ML IJ SOLN
INTRAMUSCULAR | Status: AC
  Filled 2017-12-26: qty 5

## 2017-12-26 MED ORDER — ONDANSETRON HCL 4 MG/2ML IJ SOLN
INTRAMUSCULAR | Status: DC | PRN
  Administered 2017-12-26: 4 mg via INTRAVENOUS

## 2017-12-26 MED ORDER — ESMOLOL HCL 100 MG/10ML IV SOLN
INTRAVENOUS | Status: DC | PRN
  Administered 2017-12-26: 25 mg via INTRAVENOUS

## 2017-12-26 SURGICAL SUPPLY — 48 items
ADH SKN CLS APL DERMABOND .7 (GAUZE/BANDAGES/DRESSINGS) ×1
APPLIER CLIP 5 13 M/L LIGAMAX5 (MISCELLANEOUS) ×4
APR CLP MED LRG 5 ANG JAW (MISCELLANEOUS) ×2
BAG SPEC RTRVL 10 TROC 200 (ENDOMECHANICALS) ×1
BAG SPEC RTRVL LRG 6X4 10 (ENDOMECHANICALS)
BLADE CLIPPER SURG (BLADE) ×2 IMPLANT
CANISTER SUCT 3000ML PPV (MISCELLANEOUS) ×2 IMPLANT
CHLORAPREP W/TINT 26ML (MISCELLANEOUS) ×2 IMPLANT
CLIP APPLIE 5 13 M/L LIGAMAX5 (MISCELLANEOUS) ×2 IMPLANT
COVER SURGICAL LIGHT HANDLE (MISCELLANEOUS) ×2 IMPLANT
DERMABOND ADVANCED (GAUZE/BANDAGES/DRESSINGS) ×1
DERMABOND ADVANCED .7 DNX12 (GAUZE/BANDAGES/DRESSINGS) ×1 IMPLANT
DISSECTOR BLUNT TIP ENDO 5MM (MISCELLANEOUS) IMPLANT
DRAIN CHANNEL 19F RND (DRAIN) ×2 IMPLANT
ELECT CAUTERY BLADE 6.4 (BLADE) ×2 IMPLANT
ELECT REM PT RETURN 9FT ADLT (ELECTROSURGICAL) ×2
ELECTRODE REM PT RTRN 9FT ADLT (ELECTROSURGICAL) ×1 IMPLANT
EVACUATOR SILICONE 100CC (DRAIN) ×2 IMPLANT
GLOVE BIO SURGEON STRL SZ7.5 (GLOVE) ×2 IMPLANT
GLOVE INDICATOR 8.0 STRL GRN (GLOVE) ×2 IMPLANT
GOWN STRL REUS W/ TWL LRG LVL3 (GOWN DISPOSABLE) ×2 IMPLANT
GOWN STRL REUS W/ TWL XL LVL3 (GOWN DISPOSABLE) ×1 IMPLANT
GOWN STRL REUS W/TWL LRG LVL3 (GOWN DISPOSABLE) ×4
GOWN STRL REUS W/TWL XL LVL3 (GOWN DISPOSABLE) ×2
HEMOSTAT SNOW SURGICEL 2X4 (HEMOSTASIS) ×2 IMPLANT
KIT BASIN OR (CUSTOM PROCEDURE TRAY) ×2 IMPLANT
KIT ROOM TURNOVER OR (KITS) ×2 IMPLANT
NS IRRIG 1000ML POUR BTL (IV SOLUTION) ×2 IMPLANT
PAD ARMBOARD 7.5X6 YLW CONV (MISCELLANEOUS) ×4 IMPLANT
PENCIL BUTTON HOLSTER BLD 10FT (ELECTRODE) ×2 IMPLANT
POUCH RETRIEVAL ECOSAC 10 (ENDOMECHANICALS) ×1 IMPLANT
POUCH RETRIEVAL ECOSAC 10MM (ENDOMECHANICALS) ×1
POUCH SPECIMEN RETRIEVAL 10MM (ENDOMECHANICALS) IMPLANT
SCISSORS LAP 5X35 DISP (ENDOMECHANICALS) ×2 IMPLANT
SET IRRIG TUBING LAPAROSCOPIC (IRRIGATION / IRRIGATOR) ×2 IMPLANT
SLEEVE ENDOPATH XCEL 5M (ENDOMECHANICALS) ×4 IMPLANT
SPECIMEN JAR SMALL (MISCELLANEOUS) ×2 IMPLANT
SPONGE DRAIN TRACH 4X4 STRL 2S (GAUZE/BANDAGES/DRESSINGS) ×2 IMPLANT
SUT ETHILON 2 0 FS 18 (SUTURE) ×2 IMPLANT
SUT MNCRL AB 4-0 PS2 18 (SUTURE) ×4 IMPLANT
SUT VICRYL 0 UR6 27IN ABS (SUTURE) ×2 IMPLANT
TAPE CLOTH SURG 4X10 WHT LF (GAUZE/BANDAGES/DRESSINGS) ×2 IMPLANT
TOWEL OR 17X24 6PK STRL BLUE (TOWEL DISPOSABLE) IMPLANT
TOWEL OR 17X26 10 PK STRL BLUE (TOWEL DISPOSABLE) ×2 IMPLANT
TRAY LAPAROSCOPIC MC (CUSTOM PROCEDURE TRAY) ×2 IMPLANT
TROCAR XCEL BLUNT TIP 100MML (ENDOMECHANICALS) ×2 IMPLANT
TROCAR XCEL NON-BLD 5MMX100MML (ENDOMECHANICALS) ×2 IMPLANT
TUBING INSUFFLATION (TUBING) ×2 IMPLANT

## 2017-12-26 NOTE — Anesthesia Preprocedure Evaluation (Addendum)
Anesthesia Evaluation  Patient identified by MRN, date of birth, ID band Patient awake    Reviewed: Allergy & Precautions, NPO status , Patient's Chart, lab work & pertinent test results  History of Anesthesia Complications Negative for: history of anesthetic complications  Airway Mallampati: III  TM Distance: >3 FB Neck ROM: Full    Dental  (+) Partial Lower, Partial Upper, Missing,    Pulmonary neg shortness of breath, COPD,    breath sounds clear to auscultation       Cardiovascular hypertension, Pt. on medications and Pt. on home beta blockers + CAD, + Past MI, + CABG and +CHF  + dysrhythmias Atrial Fibrillation + pacemaker + Cardiac Defibrillator  Rhythm:Regular     Neuro/Psych negative neurological ROS     GI/Hepatic Neg liver ROS, hiatal hernia,   Endo/Other  Hypothyroidism   Renal/GU Renal InsufficiencyRenal disease     Musculoskeletal   Abdominal   Peds  Hematology   Anesthesia Other Findings EF 25%-30%  Reproductive/Obstetrics                            Anesthesia Physical Anesthesia Plan  ASA: III  Anesthesia Plan: General   Post-op Pain Management:    Induction: Intravenous  PONV Risk Score and Plan: 2  Airway Management Planned: Oral ETT  Additional Equipment: None  Intra-op Plan:   Post-operative Plan: Extubation in OR  Informed Consent: I have reviewed the patients History and Physical, chart, labs and discussed the procedure including the risks, benefits and alternatives for the proposed anesthesia with the patient or authorized representative who has indicated his/her understanding and acceptance.   Dental advisory given  Plan Discussed with: CRNA and Surgeon  Anesthesia Plan Comments:         Anesthesia Quick Evaluation

## 2017-12-26 NOTE — Progress Notes (Signed)
  PROGRESS NOTE  [name] UXY:[id number] DOB: 10-24-1935 DOA: 12/21/2017 PCP: Burnard Bunting, MD  Brief Narrative: 84yom PMH CAD, ICM, afib prsetend with abd pain after a heavy meal. CTs showed gallstones. Admitted for cholelithasis, sepsis, cholecystitis. Seen by surgery.  Assessment/Plan Sepsis POA secondary to cholecystitis. HIDA c/w cholecystitis. S/p cholecystectomy 12/29. Sepsis resolved. - continue abx. - check CBC in AM  Afib, known, not on anticoagulation secondary SDH. - stable; continue diltiazem per cardiology  AKI secondary to hypotension, sepsis - resolved  CAD, CABG, ICM chronic systolic CHF, ICD, VT on amiodarone.  Hypothyroidism - stable 12/29; continue levothyroxine 12/29  Dementia - stable 12/29; will continue donepezil  DVT prophylaxis: SCDs Code Status: full Family Communication: at bedside 12/29 Disposition Plan: home    [name], MD  [facility] Direct contact: [phone] --[address]ia [place]  --[email or web address]; password [email or web address]  7PM-7AM contact night coverage as above 12/26/2017, 4:03 PM  LOS: 5 days   Consultants:  Cardiology   Procedures:    Antimicrobials:  Vancomycin 12/22/17---> 12/24/17  Zosyn 12/21/17--->  Interval history/Subjective: Confused overnight.  Resting now s/p surgery.  Objective: Vitals:  Vitals:   12/26/17 1310 12/26/17 1315  BP: 99/69   Pulse:    Resp: 20 (!) 21  Temp:  (!) 97.5 F (36.4 C)  SpO2: 94% 94%    Exam:  Constitutional:   . Appears calm and comfortable Respiratory:  . CTA bilaterally, no w/r/r.  . Respiratory effort normal.  Cardiovascular:  . Irregular, normal rate, no m/r/g . No LE extremity edema    I have personally reviewed the following:   Labs:  BMP unremarkable 12/29   Scheduled Meds: . donepezil  5 mg Oral Daily  . fenofibrate  160 mg Oral Daily  . fentaNYL      . finasteride  5 mg Oral Daily  . levothyroxine  75 mcg Oral QAC breakfast    Continuous Infusions: . sodium chloride 60 mL/hr at 12/25/17 0022  . diltiazem (CARDIZEM) infusion 10 mg/hr (12/26/17 1315)  . lactated ringers 10 mL/hr at 12/26/17 0948  . piperacillin-tazobactam (ZOSYN)  IV 3.375 g (12/26/17 1551)    Principal Problem:   Acute cholecystitis Active Problems:   Ischemic cardiomyopathy   Chronic systolic CHF (congestive heart failure) (HCC)   Atrial arrhythmia/a fibrillation   ICD (implantable cardioverter-defibrillator) in place   CAD (coronary artery disease)   Cholelithiasis   AKI (acute kidney injury) ([place])   Sepsis ([place])   Dementia   LOS: 5 days

## 2017-12-26 NOTE — Transfer of Care (Signed)
Immediate Anesthesia Transfer of Care Note  Patient: [name]  Procedure(s) Performed: LAPAROSCOPIC CHOLECYSTECTOMY (N/A Abdomen)  Patient Location: PACU  Anesthesia Type:General  Level of Consciousness: awake, patient cooperative and responds to stimulation  Airway & Oxygen Therapy: Patient Spontanous Breathing and Patient connected to nasal cannula oxygen  Post-op Assessment: Report given to RN and Post -op Vital signs reviewed and stable  Post vital signs: Reviewed and stable  Last Vitals:  Vitals:   12/26/17 0410 12/26/17 0800  BP: 102/64 113/66  Pulse:  75  Resp:  (!) 23  Temp: 36.5 C 36.5 C  SpO2:  97%    Last Pain:  Vitals:   12/26/17 0800  TempSrc: Oral  PainSc: 0-No pain         Complications: No apparent anesthesia complications

## 2017-12-26 NOTE — Progress Notes (Signed)
Subjective No acute events. Doing well. No longer in Afib with rvr. On diltiazem. Cleared from cardiology standpoint for surgery. Still with some RUQ pain although improved. Denies n/v.  Objective: Vital signs in last 24 hours: Temp:  [97.5 F (36.4 C)-98.2 F (36.8 C)] 97.7 F (36.5 C) (12/29 0800) Pulse Rate:  [66-75] 75 (12/29 0800) Resp:  [22-31] 23 (12/29 0800) BP: (102-113)/(60-66) 113/66 (12/29 0800) SpO2:  [95 %-97 %] 97 % (12/29 0800) Weight:  [76.1 kg (167 lb 12.3 oz)] 76.1 kg (167 lb 12.3 oz) (12/29 0410) Last BM Date: 12/25/17  Intake/Output from previous day: 12/28 0701 - 12/29 0700 In: 2319.9 [P.O.:240; I.V.:1879.9; IV Piggyback:200] Out: 125 [Urine:125] Intake/Output this shift: No intake/output data recorded.  Gen: NAD, comfortable CV: RRR Pulm: Normal work of breathing Abd: Soft, minimally ttp in RUQ; no r/g Ext: SCDs in place  Lab Results: CBC  Recent Labs    12/24/17 0613 12/25/17 0538  WBC 17.5* 18.5*  HGB 13.3 14.2  HCT 39.8 42.4  PLT 131* 149*   BMET Recent Labs    12/25/17 0538 12/26/17 0830  NA 138 139  K 3.6 3.2*  CL 108 109  CO2 20* 22  GLUCOSE 118* 111*  BUN 19 16  CREATININE 1.27* 1.07  CALCIUM 8.4* 8.0*   PT/INR No results for input(s): LABPROT, INR in the last 72 hours. ABG No results for input(s): PHART, HCO3 in the last 72 hours.  Invalid input(s): PCO2, PO2  Studies/Results:  Anti-infectives: Anti-infectives (From admission, onward)   Start     Dose/Rate Route Frequency Ordered Stop   12/24/17 0900  vancomycin (VANCOCIN) IVPB 750 mg/150 ml premix  Status:  Discontinued     750 mg 150 mL/hr over 60 Minutes Intravenous Every 12 hours 12/24/17 0852 12/24/17 0854   12/22/17 1300  vancomycin (VANCOCIN) IVPB 1000 mg/200 mL premix  Status:  Discontinued     1,000 mg 200 mL/hr over 60 Minutes Intravenous Every 24 hours 12/22/17 1257 12/24/17 0852   12/21/17 2330  [MAR Hold]  piperacillin-tazobactam (ZOSYN) IVPB 3.375 g      (MAR Hold since 12/26/17 0920)   3.375 g 12.5 mL/hr over 240 Minutes Intravenous Every 8 hours 12/21/17 2302         Assessment/Plan: Patient Active Problem List   Diagnosis Date Noted  . Sepsis ([place]) 12/25/2017  . Acute cholecystitis 12/25/2017  . Dementia 12/25/2017  . AKI (acute kidney injury) ([place]) 12/24/2017  . Cholelithiasis 12/21/2017  . Chest pain 05/19/2016  . CAD (coronary artery disease) 01/23/2016  . Dyslipidemia 07/26/2014  . S/P inguinal hernia repair 05/15/2014  . Left inguinal hernia 04/05/2014  . Atrial oversensing on his defibrillator 12/09/2013  . Palpitations 12/09/2013  . Weight loss 07/28/2013  . hypertransaminasemia 04/01/2011  . Ischemic cardiomyopathy   . Ventricular tachycardia ([place])   . Ventricular fibrillation ([place])   . Chronic systolic CHF (congestive heart failure) ([place])   . Encounter for long-term (current) use of other medications   . Atrial arrhythmia/a fibrillation   . ICD (implantable cardioverter-defibrillator) in place   . Hyperlipidemia LDL goal <70 03/19/2010   OR today for laparoscopic vs open cholecystectomy; all other indicated procedures  The anatomy & physiology of hepatobiliary & pancreatic function was discussed.  The pathophysiology of gallbladder dysfunction was discussed.  Natural history risks without surgery was discussed.   I feel the risks of no intervention will lead to serious problems that outweigh the operative risks; therefore, I recommended cholecystectomy to remove  the pathology.  I explained laparoscopic techniques with possible need for an open approach.  Possible cholangiogram to evaluate the bilary tract was explained as well.    The planned procedure, material risks (including but not limited to pain, bleeding, infection, abscess, leak, injury to other organs/viscus/nerves/vessels, need for additional procedures, heart attack, stroke, pneumonia, aspiration, death) benefits and alternatives were described.  I noted  a good likelihood this will help address the problem.  Possibility that this will not correct all abdominal symptoms was explained.  Goals of post-operative recovery were discussed as well.  His questions were answered to his satisfaction, he expressed understanding and elected to proceed with surgery.   LOS: 5 days   Sharon Mt. Dema Severin, M.D. [facility]

## 2017-12-26 NOTE — Plan of Care (Signed)
Pt has had no complaints of pain during the shift.

## 2017-12-26 NOTE — Progress Notes (Signed)
[employer] report delivered by NT to 61M- C. Dariel Betzer, RN rec'd verbal report at 25 from [employer] regarding medical device - device working correctly but does have some "artifact/atrial noise -unsustained noted in the atrial lead" per Felecia Shelling, she would  notify local [employer] rep to see if any bedside follow up  needed. Pt also had one episode of non sustained V-tach on 12/13/2017. Wells Guiles, RN on 61M updated on arrival to 61M at pt transfer of care.

## 2017-12-26 NOTE — Progress Notes (Signed)
RN received call from [place] stating the monitor showed occasional pacer spikes inside the QRS and sometimes after the QRS. RN went to assess pt. Pt in no acute distress, no chest pain, asymptomatic. Pt states he can feel when his ICD fires, however he has not felt it fire in a long time (none since he has been in the hospital). Will continue to monitor.

## 2017-12-26 NOTE — Op Note (Signed)
12/26/2017 12:27 PM  PATIENT: [name]  82 y.o. male  Patient Care Team: Burnard Bunting, MD as PCP - General (Internal Medicine)  PRE-OPERATIVE DIAGNOSIS: cholecystitis  POST-OPERATIVE DIAGNOSIS: acute cholecystitis/cholelithiasis  PROCEDURE: Laparoscopic cholecystectomy  SURGEON: Sharon Mt. Delmy Holdren, MD  ASSISTANT: none   ANESTHESIA: General endotracheal  EBL: Total I/O In: 500 [I.V.:500] Out: -   DRAINS: 19Fr blake left draining the gallbladder fossa  SPECIMEN: Gallbladder and stones  COUNTS: Sponge, needle and instrument counts were reported correct x2 at the conclusion of the operation  DISPOSITION: PACU in satisfactory condition  FINDINGS: Acute suppurative cholecystitis with empyema - purulent fluid in gallbladder; gallbladder incredibly friable and fragile. Critical view of safety obtained prior to placement of any clips or division of any structures. 19Fr blake left draining the gallbladder fossa and a piece of surgicel was left on the liver bed.  INDICATION:  82yo gentleman presented with acute cholecystitis. Hx of CHF, CAD, s/p CABG 2012 with AICD in place and diastolic dysfunction - seen preoperatively by cardiology and cleared for surgery as a moderate risk. He developed Afib with RVR delaying his time to the OR. Once this had resolved and he was again cleared by cardiology, he was recommended to go to the OR to address his cholecystitis. A percutaneous cholecystostomy was initially offered but upon clearance by cardiology he did not want the tube placed.  The anatomy & physiology of hepatobiliary & pancreatic function was discussed. The pathophysiology of gallbladder dysfunction was discussed. Natural history and material risks without surgery was discussed. I feel the risks of no intervention will lead to serious problems that outweigh the operative risks; therefore, I recommended cholecystectomy to remove the probable pathology. I explained laparoscopic  techniques with possible need for an open approach. Additionally, I discussed the possible need for a cholangiogram to evaluate the bilary tract, if indicated.  The procedure, material risks (including but not limited to bleeding; infection; scarring; abscess; bile leak; injury to other organs such as bowel, blood vessels, nerves, bile duct; hernia; the need for additional procedures; heart attack; stroke; death), benefits and alternatives to surgery were discussed at length. The patient's questions were answered to their satisfaction and the patient elected to proceed with surgery.  I noted a good likelihood this will help address the problem.  Possibility that this will not correct all abdominal symptoms was also explained.  Goals of post-operative recovery were discussed as well.     DESCRIPTION:   The patient was identified & brought into the operating room. The patient was positioned supine on the OR table. SCDs were in place and active during the entire case. The patient underwent general endotracheal anesthesia. The abdomen was prepped and draped in the standard sterile fashion and antibiotics were administered. A surgical timeout was performed and confirmed our plan.   A periumbilical incision was made. The umbilical stalk was grasped and retracted outwardly. The infraumbilical fascia was identified and incised. The peritoneal cavity was gently entered bluntly. A purse-string 0 Vicryl suture was placed. The Hasson cannula was inserted into the peritoneal cavity and insufflation with CO2 commenced to 15mmHg. A laparoscope was inserted into the peritoneal cavity and inspection confirmed no evidence of trocar site complications. The patient was then positioned in reverse Trendelenburg with the left side down. 3 additional 55mm trocars were placed along the right subcostal line - one 61mm port in mid subcostal region, another 8mm port in the right flank near the anterior axillary line, and a third 32mm  port  in the left subxiphoid region obliquely near the falciform ligament.  The colon and omentum were adhered to the gallbladder and the adhesions to this were dissected and freed. Ultimately the gallbladder was visible. The liver and gallbladder were inspected. The gallbladder was inflammed and had pus surrounding it. The gallbladder was therefore decompressed with a suction needle to facilitate grasping it. The gallbladder fundus was grasped and elevated cephalad. Additional adhesions were taken down sharply. An additional grasper was then placed on the infundibulum of the gallbladder and the infundibulum was retracted laterally. Gentle blunt dissection was then employed with a IT consultant working down into [employer]. The peritoneum on both sides of the gallbladder was opened with hook cautery. The cystic duct was identified, carefully circumferentially dissected. The cystic artery was then carefully circumferentially dissected. The space between the cystic artery and hepatocystic plate was developed such that the liver could be seen through a window medial to the cystic artery. The triangle of Calot was cleared of all fibrofatty tissue. At this point, a critical view of safety was achieved and the only structures visualized was the skeletonized cystic duct laterally, the skeletonized cystic artery and the liver through the window medial to the artery.   The cystic duct and artery were clipped with 2 clips on the "stay" side and 1 clip on the specimen side. The cystic duct and artery were then divided. The gallbladder was then freed from its remaining attachments to the liver using electrocautery and placed into an Ecosac as were stones that were spilled during grasping the gallbladder and it rupturing from just gentle retraction. Hemostasis was achieved and then re-verified. The rest of the abdomen was inspected no injury nor bleeding elsewhere was identified. The RUQ was irrigated with normal saline  until the effluent ran clear. The clips and gallbladder fossa were reinspected and noted to be in place and hemostatic. A piece of surgicel was placed in the gallbladder fossa.  The gallbladder and Ecosac was then removed from the umbilical port site and passed off as specimen. The RUQ ports were removed under direct visualization and noted to be hemostatic. The umbilical fascia was then closed using 0 Vicryl suture. The skin of all incision sites was approximated with 4-0 monocryl subcuticular suture and dermabond applied. The patient was then extubated and transferred to a stretcher for transport to PACU in satisfactory condition.

## 2017-12-26 NOTE — Anesthesia Postprocedure Evaluation (Signed)
Anesthesia Post Note  Patient: [name]  Procedure(s) Performed: LAPAROSCOPIC CHOLECYSTECTOMY (N/A Abdomen)     Patient location during evaluation: PACU Anesthesia Type: General Level of consciousness: awake and patient cooperative Pain management: pain level controlled Vital Signs Assessment: post-procedure vital signs reviewed and stable Respiratory status: spontaneous breathing, nonlabored ventilation, respiratory function stable and patient connected to nasal cannula oxygen Cardiovascular status: blood pressure returned to baseline and stable Postop Assessment: no apparent nausea or vomiting Anesthetic complications: no    Last Vitals:  Vitals:   12/26/17 1315 12/26/17 1500  BP:  119/68  Pulse:  74  Resp: (!) 21 (!) 24  Temp: (!) 36.4 C 36.6 C  SpO2: 94% 96%    Last Pain:  Vitals:   12/26/17 1500  TempSrc: Oral  PainSc:                  Jaylon Grode

## 2017-12-27 ENCOUNTER — Encounter (HOSPITAL_COMMUNITY): Payer: Self-pay | Admitting: Surgery

## 2017-12-27 DIAGNOSIS — I480 Paroxysmal atrial fibrillation: Secondary | ICD-10-CM

## 2017-12-27 LAB — BASIC METABOLIC PANEL
Anion gap: 7 (ref 5–15)
BUN: 17 mg/dL (ref 6–20)
CALCIUM: 7.9 mg/dL — AB (ref 8.9–10.3)
CO2: 23 mmol/L (ref 22–32)
CREATININE: 1.08 mg/dL (ref 0.61–1.24)
Chloride: 108 mmol/L (ref 101–111)
GFR calc Af Amer: >60 mL/min (ref >60)
GLUCOSE: 176 mg/dL — AB (ref 65–99)
Potassium: 3.5 mmol/L (ref 3.5–5.1)
Sodium: 138 mmol/L (ref 135–145)

## 2017-12-27 LAB — CBC
HCT: 40.2 % (ref 39.0–52.0)
Hemoglobin: 13.5 g/dL (ref 13.0–17.0)
MCH: 31.1 pg (ref 26.0–34.0)
MCHC: 33.6 g/dL (ref 30.0–36.0)
MCV: 92.6 fL (ref 78.0–100.0)
PLATELETS: 214 10*3/uL (ref 150–400)
RBC: 4.34 MIL/uL (ref 4.22–5.81)
RDW: 14.3 % (ref 11.5–15.5)
WBC: 13.7 10*3/uL — AB (ref 4.0–10.5)

## 2017-12-27 LAB — GLUCOSE, CAPILLARY
Glucose-Capillary: 157 mg/dL — ABNORMAL HIGH (ref 65–99)
Glucose-Capillary: 190 mg/dL — ABNORMAL HIGH (ref 65–99)

## 2017-12-27 LAB — CULTURE, BLOOD (ROUTINE X 2)
CULTURE: NO GROWTH
Culture: NO GROWTH
SPECIAL REQUESTS: ADEQUATE
SPECIAL REQUESTS: ADEQUATE

## 2017-12-27 MED ORDER — AMIODARONE HCL 100 MG PO TABS
50.0000 mg | ORAL_TABLET | Freq: Every day | ORAL | Status: DC
  Administered 2017-12-27 – 2017-12-30 (×4): 50 mg via ORAL
  Filled 2017-12-27 (×5): qty 1

## 2017-12-27 MED ORDER — CARVEDILOL 6.25 MG PO TABS
6.2500 mg | ORAL_TABLET | Freq: Two times a day (BID) | ORAL | Status: DC
  Administered 2017-12-27 – 2017-12-30 (×7): 6.25 mg via ORAL
  Filled 2017-12-27 (×7): qty 1

## 2017-12-27 MED ORDER — AMOXICILLIN-POT CLAVULANATE 875-125 MG PO TABS
1.0000 | ORAL_TABLET | Freq: Two times a day (BID) | ORAL | Status: DC
  Administered 2017-12-27 – 2017-12-30 (×7): 1 via ORAL
  Filled 2017-12-27 (×7): qty 1

## 2017-12-27 MED ORDER — CARVEDILOL 6.25 MG PO TABS: 6.2500 mg | ORAL_TABLET | Freq: Two times a day (BID) | ORAL | Status: DC

## 2017-12-27 NOTE — Progress Notes (Signed)
[facility] Progress Note 1 Day Post-Op  Subjective: Patient doing well after surgery.  Wants to get out of bed  Objective: Vital signs in last 24 hours: Temp:  [97.5 F (36.4 C)-98.4 F (36.9 C)] 97.7 F (36.5 C) (12/30 0758) Pulse Rate:  [74-100] 78 (12/30 0500) Resp:  [14-28] 20 (12/30 0500) BP: (99-126)/(62-81) 119/71 (12/30 0758) SpO2:  [88 %-96 %] 95 % (12/30 0500) Last BM Date: 12/25/17  Intake/Output from previous day: 12/29 0701 - 12/30 0700 In: 2403 [I.V.:2303; IV Piggyback:100] Out: 390 [Urine:150; Drains:190; Blood:50] Intake/Output this shift: No intake/output data recorded.  General: No acute distress.  Is in atrial fibrillation.  On diltiazem drip.  Lungs: Clear  Abd: Slightly distended.  Wounds are okay.  Drain output about 200cc, but mostly old blood, not bilious.  Extremities: No changes  Neuro: Intact  Lab Results:  @LABLAST2 (wbc:2,hgb:2,hct:2,plt:2) BMET ) Recent Labs    12/25/17 0538 12/26/17 0830  NA 138 139  K 3.6 3.2*  CL 108 109  CO2 20* 22  GLUCOSE 118* 111*  BUN 19 16  CREATININE 1.27* 1.07  CALCIUM 8.4* 8.0*   PT/INR No results for input(s): LABPROT, INR in the last 72 hours. ABG No results for input(s): PHART, HCO3 in the last 72 hours.  Invalid input(s): PCO2, PO2  Studies/Results: No results found.  Anti-infectives: Anti-infectives (From admission, onward)   Start     Dose/Rate Route Frequency Ordered Stop   12/24/17 0900  vancomycin (VANCOCIN) IVPB 750 mg/150 ml premix  Status:  Discontinued     750 mg 150 mL/hr over 60 Minutes Intravenous Every 12 hours 12/24/17 0852 12/24/17 0854   12/22/17 1300  vancomycin (VANCOCIN) IVPB 1000 mg/200 mL premix  Status:  Discontinued     1,000 mg 200 mL/hr over 60 Minutes Intravenous Every 24 hours 12/22/17 1257 12/24/17 0852   12/21/17 2330  piperacillin-tazobactam (ZOSYN) IVPB 3.375 g     3.375 g 12.5 mL/hr over 240 Minutes Intravenous Every 8 hours 12/21/17 2302         Assessment/Plan: s/p Procedure(s): LAPAROSCOPIC CHOLECYSTECTOMY Advance diet  LOS: 6 days   Kathryne Eriksson. Dahlia Bailiff, MD, FACS [phone] [facility] 12/27/2017

## 2017-12-27 NOTE — Progress Notes (Signed)
  PROGRESS NOTE  [name] PZW:[id number] DOB: 1935-01-06 DOA: 12/21/2017 PCP: Burnard Bunting, MD  Brief Narrative: 81yom PMH CAD, ICM, afib prsetend with abd pain after a heavy meal. CTs showed gallstones. Admitted for cholelithasis, sepsis, cholecystitis. Seen by surgery.  Assessment/Plan Sepsis POA secondary to cholecystitis. HIDA c/w cholecystitis. S/p cholecystectomy 12/29. Sepsis resolved. - improving; WBC trending down - change to oral abx today and d/c next 48 hours - diet per surgery  Afib, known, not on anticoagulation secondary to past SDH. - stable; resume home BB and amiodarone; d/c diltiazem infusion  AKI secondary to hypotension, sepsis - resolved  CAD, CABG, ICM chronic systolic CHF, ICD, VT on amiodarone. - CHF appears compensated  Hypothyroidism - stable 12/30; continue levothyroxine 12/30  Dementia - stable 12/30; will continue donepezil 12/30  Improving, will transfer to telemetry, consult PT  DVT prophylaxis: SCDs Code Status: full Family Communication: at bedside 12/30 Disposition Plan: home    [name], MD  [facility] Direct contact: [phone] --Via amion app OR  --[email or web address]; password [email or web address]  7PM-7AM contact night coverage as above 12/27/2017, 8:56 AM  LOS: 6 days   Consultants:  Cardiology   Procedures:  Laparoscopic cholecystectomy  Antimicrobials:  Vancomycin 12/22/17---> 12/24/17  Zosyn 12/21/17--->  Interval history/Subjective: NSVT overnight  Feels sore but otherwise ok today.  Objective: Vitals:  Vitals:   12/27/17 0500 12/27/17 0758  BP: 124/80 119/71  Pulse: 78   Resp: 20   Temp:  97.7 F (36.5 C)  SpO2: 95%     Exam:  Constitutional:   . Appears calm and comfortable Eyes:  . pupils and irises appear normal . Normal lids  ENMT:  . grossly normal hearing  . Lips appear normal Respiratory:  . CTA bilaterally, no w/r/r.  . Respiratory effort normal.  Cardiovascular:  . RRR,  no m/r/g . No LE extremity edema   Psychiatric:  . Mental status o Mood, affect appropriate  I have personally reviewed the following:   Labs:  CBG stable 12/30  BMP unremarkable  Hgb stable 13.5  WBC 18.5 >> 13.7   Scheduled Meds: . donepezil  5 mg Oral Daily  . fenofibrate  160 mg Oral Daily  . finasteride  5 mg Oral Daily  . levothyroxine  75 mcg Oral QAC breakfast   Continuous Infusions: . sodium chloride 60 mL/hr at 12/25/17 0022  . diltiazem (CARDIZEM) infusion 10 mg/hr (12/27/17 0130)  . lactated ringers Stopped (12/26/17 1900)  . piperacillin-tazobactam (ZOSYN)  IV 3.375 g (12/27/17 0503)    Principal Problem:   Acute cholecystitis Active Problems:   Ischemic cardiomyopathy   Chronic systolic CHF (congestive heart failure) (HCC)   Atrial arrhythmia/a fibrillation   ICD (implantable cardioverter-defibrillator) in place   CAD (coronary artery disease)   Cholelithiasis   AKI (acute kidney injury) ([place])   Sepsis ([place])   Dementia   LOS: 6 days

## 2017-12-27 NOTE — Plan of Care (Signed)
Pt has had no complaints of pain during the shift.  Pt has had no issues with safety and has remained free from harm during the shift.

## 2017-12-27 NOTE — Progress Notes (Signed)
Progress Note  Patient Name: [name] Date of Encounter: 12/27/2017  Primary Cardiologist:  Nita Sickle   Subjective   Patient presented with abdominal pain.  He is now status post cholecystectomy.  He has had some atrial fibrillation in the past and has been on Coumadin.  He had a subdural hematoma while on Coumadin.  I do not have the records as to whether or not he was supratherapeutic on Coumadin or whether his INR was in the therapeutic range.  Basically asymptomatic from a A. fib standpoint.  He is not having any episodes of chest pain or shortness of breath.  Inpatient Medications    Scheduled Meds: . amiodarone  50 mg Oral Daily  . carvedilol  6.25 mg Oral BID WC  . donepezil  5 mg Oral Daily  . fenofibrate  160 mg Oral Daily  . finasteride  5 mg Oral Daily  . levothyroxine  75 mcg Oral QAC breakfast   Continuous Infusions: . sodium chloride 60 mL/hr at 12/25/17 0022  . diltiazem (CARDIZEM) infusion 10 mg/hr (12/27/17 1022)  . lactated ringers Stopped (12/26/17 1900)  . piperacillin-tazobactam (ZOSYN)  IV Stopped (12/27/17 0947)   PRN Meds: acetaminophen **OR** acetaminophen, HYDROmorphone (DILAUDID) injection, nitroGLYCERIN, ondansetron **OR** ondansetron (ZOFRAN) IV   Vital Signs    Vitals:   12/27/17 0407 12/27/17 0500 12/27/17 0758 12/27/17 1159  BP:  124/80 119/71 124/78  Pulse:  78    Resp:  20    Temp: 98.4 F (36.9 C)  97.7 F (36.5 C) 98.5 F (36.9 C)  TempSrc: Oral  Oral Oral  SpO2:  95%    Weight:      Height:        Intake/Output Summary (Last 24 hours) at 12/27/2017 1203 Last data filed at 12/27/2017 1202 Gross per 24 hour  Intake 2143 ml  Output 440 ml  Net 1703 ml   Filed Weights   12/24/17 0729 12/25/17 0540 12/26/17 0410  Weight: 204 lb 2.3 oz (92.6 kg) 165 lb 2 oz (74.9 kg) 167 lb 12.3 oz (76.1 kg)    Telemetry    Atrial for ablation with controlled ventricular response- Personally Reviewed  ECG      Fibrillation- Personally Reviewed  Physical Exam   GEN: No acute distress.   Neck: No JVD Cardiac:  Irregularly irregular Respiratory: Clear to auscultation bilaterally. GI: Soft, nontender, non-distended  MS: No edema; No deformity. Neuro:  Nonfocal  Psych: Normal affect   Labs    Chemistry Recent Labs  Lab 12/23/17 0512 12/24/17 0613 12/25/17 0538 12/26/17 0830 12/27/17 0934  NA 138 137 138 139 138  K 4.2 3.6 3.6 3.2* 3.5  CL 102 107 108 109 108  CO2 26 20* 20* 22 23  GLUCOSE 116* 104* 118* 111* 176*  BUN 34* 22* 19 16 17   CREATININE 2.00* 1.34* 1.27* 1.07 1.08  CALCIUM 8.3* 8.2* 8.4* 8.0* 7.9*  PROT 5.9* 5.5* 5.7*  --   --   ALBUMIN 2.9* 2.5* 2.5*  --   --   AST 44* 25 28  --   --   ALT 24 21 23   --   --   ALKPHOS 22* 24* 29*  --   --   BILITOT 1.4* 1.5* 2.0*  --   --   GFRNONAA 30* 48* 52* >60 >60  GFRAA 35* 56* 60* >60 >60  ANIONGAP 10 10 10 8 7      Hematology Recent Labs  Lab 12/24/17 [phone] 12/25/17  0488 12/27/17 0934  WBC 17.5* 18.5* 13.7*  RBC 4.29 4.55 4.34  HGB 13.3 14.2 13.5  HCT 39.8 42.4 40.2  MCV 92.8 93.2 92.6  MCH 31.0 31.2 31.1  MCHC 33.4 33.5 33.6  RDW 14.2 14.4 14.3  PLT 131* 149* 214    Cardiac Enzymes Recent Labs  Lab 12/22/17 0934  TROPONINI 0.04*   No results for input(s): TROPIPOC in the last 168 hours.   BNPNo results for input(s): BNP, PROBNP in the last 168 hours.   DDimer No results for input(s): DDIMER in the last 168 hours.   Radiology    No results found.  Cardiac Studies     Patient Profile     82 y.o. male history of coronary artery disease, ischemic cardia myopathy with an ejection fraction of around 25-30%, status post AICD, atrial fibrillation admitted with abdominal pain and found to have gallstones.  Assessment & Plan    1.  Atrial fibrillation: Patient has had atrial for ablation in the past.  This been fairly well controlled on amiodarone. Approximately 10-15 years ago he had a subdural  hematoma while on Coumadin.  One option would be to consider low-dose Eliquis-2.5 mg twice a day.  This may provide him some stroke protection while at the same time hopefully would not cause a subdural hematoma.  It may be beneficial to discuss with neurosurgery.  2.  Coronary artery disease: He seems to be stable.  He is not having any angina.  3.  Chronic systolic congestive heart failure: He appears to be stable.  For questions or updates, please contact [place] Please consult [email or web address] for contact info under Cardiology/STEMI.      Signed, Mertie Moores, MD  12/27/2017, 12:03 PM

## 2017-12-27 NOTE — Progress Notes (Signed)
Pt had a 10 beat run of VT immediately followed by 4 beat run VT. On-call provider, Kennon Holter, NP notified. No new orders. Will continue to monitor.

## 2017-12-28 ENCOUNTER — Other Ambulatory Visit: Payer: Self-pay

## 2017-12-28 ENCOUNTER — Inpatient Hospital Stay (HOSPITAL_COMMUNITY): Payer: Medicare Other

## 2017-12-28 DIAGNOSIS — I4892 Unspecified atrial flutter: Secondary | ICD-10-CM

## 2017-12-28 DIAGNOSIS — I480 Paroxysmal atrial fibrillation: Secondary | ICD-10-CM

## 2017-12-28 DIAGNOSIS — K810 Acute cholecystitis: Secondary | ICD-10-CM

## 2017-12-28 LAB — BASIC METABOLIC PANEL
Anion gap: 7 (ref 5–15)
BUN: 18 mg/dL (ref 6–20)
CALCIUM: 8 mg/dL — AB (ref 8.9–10.3)
CO2: 23 mmol/L (ref 22–32)
CREATININE: 1.07 mg/dL (ref 0.61–1.24)
Chloride: 104 mmol/L (ref 101–111)
GFR calc non Af Amer: >60 mL/min (ref >60)
GLUCOSE: 142 mg/dL — AB (ref 65–99)
Potassium: 3.6 mmol/L (ref 3.5–5.1)
Sodium: 134 mmol/L — ABNORMAL LOW (ref 135–145)

## 2017-12-28 LAB — MAGNESIUM: Magnesium: 1.8 mg/dL (ref 1.7–2.4)

## 2017-12-28 LAB — GLUCOSE, CAPILLARY
GLUCOSE-CAPILLARY: 113 mg/dL — AB (ref 65–99)
Glucose-Capillary: 119 mg/dL — ABNORMAL HIGH (ref 65–99)
Glucose-Capillary: 152 mg/dL — ABNORMAL HIGH (ref 65–99)

## 2017-12-28 MED ORDER — SODIUM CHLORIDE 0.9 % IV SOLN: 250.0000 mL | INTRAVENOUS | Status: DC | PRN

## 2017-12-28 MED ORDER — HYDROCORTISONE 1 % EX CREA: 1.0000 "application " | TOPICAL_CREAM | Freq: Three times a day (TID) | CUTANEOUS | Status: DC | PRN

## 2017-12-28 MED ORDER — ALUM & MAG HYDROXIDE-SIMETH 200-200-20 MG/5ML PO SUSP: 30.0000 mL | Freq: Four times a day (QID) | ORAL | Status: DC | PRN

## 2017-12-28 MED ORDER — BISACODYL 10 MG RE SUPP
10.0000 mg | Freq: Two times a day (BID) | RECTAL | Status: DC | PRN
  Administered 2017-12-28: 10 mg via RECTAL
  Filled 2017-12-28 (×2): qty 1

## 2017-12-28 MED ORDER — PSYLLIUM 95 % PO PACK
1.0000 | PACK | Freq: Every day | ORAL | Status: DC
  Administered 2017-12-28 – 2017-12-30 (×3): 1 via ORAL
  Filled 2017-12-28 (×3): qty 1

## 2017-12-28 MED ORDER — TRAMADOL HCL 50 MG PO TABS
50.0000 mg | ORAL_TABLET | Freq: Four times a day (QID) | ORAL | Status: DC | PRN
  Filled 2017-12-28: qty 2

## 2017-12-28 MED ORDER — HYDROCORTISONE 2.5 % RE CREA: 1.0000 "application " | TOPICAL_CREAM | Freq: Four times a day (QID) | RECTAL | Status: DC | PRN

## 2017-12-28 MED ORDER — MAGIC MOUTHWASH
15.0000 mL | Freq: Four times a day (QID) | ORAL | Status: DC | PRN
  Filled 2017-12-28: qty 15

## 2017-12-28 MED ORDER — DIPHENHYDRAMINE HCL 50 MG/ML IJ SOLN: 12.5000 mg | Freq: Four times a day (QID) | INTRAMUSCULAR | Status: DC | PRN

## 2017-12-28 MED ORDER — ENSURE SURGERY PO LIQD
237.0000 mL | Freq: Two times a day (BID) | ORAL | Status: DC
  Administered 2017-12-29 – 2017-12-30 (×3): 237 mL via ORAL
  Filled 2017-12-28 (×7): qty 237

## 2017-12-28 MED ORDER — LIP MEDEX EX OINT
1.0000 "application " | TOPICAL_OINTMENT | Freq: Two times a day (BID) | CUTANEOUS | Status: DC
  Filled 2017-12-28: qty 7

## 2017-12-28 MED ORDER — SODIUM CHLORIDE 0.9% FLUSH
3.0000 mL | Freq: Two times a day (BID) | INTRAVENOUS | Status: DC
  Administered 2017-12-28 – 2017-12-30 (×3): 3 mL via INTRAVENOUS

## 2017-12-28 MED ORDER — METHOCARBAMOL 500 MG PO TABS: 1000.0000 mg | ORAL_TABLET | Freq: Four times a day (QID) | ORAL | Status: DC | PRN

## 2017-12-28 MED ORDER — SODIUM CHLORIDE 0.9% FLUSH: 3.0000 mL | INTRAVENOUS | Status: DC | PRN

## 2017-12-28 MED ORDER — SPIRONOLACTONE 25 MG PO TABS
25.0000 mg | ORAL_TABLET | Freq: Every day | ORAL | Status: DC
  Administered 2017-12-28 – 2017-12-30 (×3): 25 mg via ORAL
  Filled 2017-12-28 (×3): qty 1

## 2017-12-28 MED ORDER — GUAIFENESIN-DM 100-10 MG/5ML PO SYRP: 10.0000 mL | ORAL_SOLUTION | ORAL | Status: DC | PRN

## 2017-12-28 MED ORDER — MENTHOL 3 MG MT LOZG: 1.0000 | LOZENGE | OROMUCOSAL | Status: DC | PRN

## 2017-12-28 MED ORDER — PHENOL 1.4 % MT LIQD: 1.0000 | OROMUCOSAL | Status: DC | PRN

## 2017-12-28 MED ORDER — DIPHENHYDRAMINE HCL 25 MG PO CAPS
25.0000 mg | ORAL_CAPSULE | Freq: Four times a day (QID) | ORAL | Status: DC | PRN
Start: 2017-12-28 — End: 2017-12-30

## 2017-12-28 MED ORDER — ACETAMINOPHEN 500 MG PO TABS
1000.0000 mg | ORAL_TABLET | Freq: Three times a day (TID) | ORAL | Status: DC
  Administered 2017-12-28 – 2017-12-30 (×6): 1000 mg via ORAL
  Filled 2017-12-28 (×6): qty 2

## 2017-12-28 MED ORDER — BLISTEX MEDICATED EX OINT
TOPICAL_OINTMENT | Freq: Two times a day (BID) | CUTANEOUS | Status: DC
  Administered 2017-12-28 – 2017-12-30 (×4): via TOPICAL
  Filled 2017-12-28 (×2): qty 6.3

## 2017-12-28 MED ORDER — METHOCARBAMOL 1000 MG/10ML IJ SOLN: 1000.0000 mg | Freq: Four times a day (QID) | INTRAVENOUS | Status: DC | PRN

## 2017-12-28 NOTE — Progress Notes (Signed)
Progress Note  Patient Name: [name] Date of Encounter: 12/28/2017  Primary Cardiologist:  Nita Sickle   Subjective   Anxious to get up and walk Taking PO some gas no formed BM .  Inpatient Medications    Scheduled Meds: . amiodarone  50 mg Oral Daily  . amoxicillin-clavulanate  1 tablet Oral Q12H  . carvedilol  6.25 mg Oral BID WC  . donepezil  5 mg Oral Daily  . fenofibrate  160 mg Oral Daily  . finasteride  5 mg Oral Daily  . levothyroxine  75 mcg Oral QAC breakfast   Continuous Infusions: . sodium chloride 60 mL/hr at 12/27/17 2033  . lactated ringers Stopped (12/26/17 1900)   PRN Meds: acetaminophen **OR** acetaminophen, HYDROmorphone (DILAUDID) injection, nitroGLYCERIN, ondansetron **OR** ondansetron (ZOFRAN) IV   Vital Signs    Vitals:   12/27/17 1923 12/27/17 2318 12/28/17 0324 12/28/17 0735  BP: 114/78 124/82 106/73 121/83  Pulse: 90 96 85 79  Resp: 17 20 20  (!) 22  Temp: 98 F (36.7 C) 97.8 F (36.6 C) 97.9 F (36.6 C) 97.9 F (36.6 C)  TempSrc: Oral Oral Oral Oral  SpO2: 96% 96% 97% 97%  Weight:      Height:        Intake/Output Summary (Last 24 hours) at 12/28/2017 0831 Last data filed at 12/28/2017 0600 Gross per 24 hour  Intake 2020 ml  Output 765 ml  Net 1255 ml   Filed Weights   12/24/17 0729 12/25/17 0540 12/26/17 0410  Weight: 204 lb 2.3 oz (92.6 kg) 165 lb 2 oz (74.9 kg) 167 lb 12.3 oz (76.1 kg)    Telemetry    Atrial for ablation with controlled ventricular response- Personally Reviewed  ECG    Fibrillation- Personally Reviewed  Physical Exam   BP 121/83   Pulse 79   Temp 97.9 F (36.6 C) (Oral)   Resp (!) 22   Ht 5\' 11"  (1.803 m)   Wt 167 lb 12.3 oz (76.1 kg)   SpO2 97%   BMI 23.40 kg/m  Affect appropriate Healthy:  appears stated age HEENT: normal Neck supple with no adenopathy JVP normal no bruits no thyromegaly Lungs clear with no wheezing and good diaphragmatic motion Heart:  S1/S2 no murmur,  no rub, gallop or click PMI normal Abdomen: JP drain still in some bruising around umbilicus mild tympany no tenderness no bruit.  No HSM or HJR Distal pulses intact with no bruits No edema Neuro non-focal Skin warm and dry No muscular weakness   Labs    Chemistry Recent Labs  Lab 12/23/17 0512 12/24/17 0613 12/25/17 0538 12/26/17 0830 12/27/17 0934  NA 138 137 138 139 138  K 4.2 3.6 3.6 3.2* 3.5  CL 102 107 108 109 108  CO2 26 20* 20* 22 23  GLUCOSE 116* 104* 118* 111* 176*  BUN 34* 22* 19 16 17   CREATININE 2.00* 1.34* 1.27* 1.07 1.08  CALCIUM 8.3* 8.2* 8.4* 8.0* 7.9*  PROT 5.9* 5.5* 5.7*  --   --   ALBUMIN 2.9* 2.5* 2.5*  --   --   AST 44* 25 28  --   --   ALT 24 21 23   --   --   ALKPHOS 22* 24* 29*  --   --   BILITOT 1.4* 1.5* 2.0*  --   --   GFRNONAA 30* 48* 52* >60 >60  GFRAA 35* 56* 60* >60 >60  ANIONGAP 10 10 10 8  7  Hematology Recent Labs  Lab 12/24/17 [phone] 12/25/17 0538 12/27/17 0934  WBC 17.5* 18.5* 13.7*  RBC 4.29 4.55 4.34  HGB 13.3 14.2 13.5  HCT 39.8 42.4 40.2  MCV 92.8 93.2 92.6  MCH 31.0 31.2 31.1  MCHC 33.4 33.5 33.6  RDW 14.2 14.4 14.3  PLT 131* 149* 214    Cardiac Enzymes Recent Labs  Lab 12/22/17 0934  TROPONINI 0.04*   No results for input(s): TROPIPOC in the last 168 hours.   BNPNo results for input(s): BNP, PROBNP in the last 168 hours.   DDimer No results for input(s): DDIMER in the last 168 hours.   Radiology    No results found.  Cardiac Studies     Patient Profile     82 y.o. male history of coronary artery disease, ischemic cardia myopathy with an ejection fraction of around 25-30%, status post AICD, atrial fibrillation admitted with abdominal pain and found to have gallstones.  Assessment & Plan    1.  Atrial fibrillation: Patient has had atrial for ablation in the past.  Good rate control on coreg agree with starting eliquis 2.5 bid on d/c   2.  Coronary artery disease:  No chest pain stabel   3.  CHF:  euvolemic I/O;s over a liter positive will resume home aldactone   For questions or updates, please contact [place] Please consult [email or web address] for contact info under Cardiology/STEMI.      Signed, Jenkins Rouge, MD  12/28/2017, 8:31 AM

## 2017-12-28 NOTE — Consult Note (Signed)
ELECTROPHYSIOLOGY CONSULT NOTE  Patient ID: [name], MRN: [id number], DOB/AGE: August 08, 1935 82 y.o. Admit date: 12/21/2017 Date of Consult: 12/28/2017  Primary Physician: Burnard Bunting, MD Primary Cardiologist: PNa/SK ADA WOODBURY is a 82 y.o. male who is being seen today for the evaluation of ICD shock therapy at the request of Dr. Raynaldo Opitz.   Chief Complaint: Atrial flutter   HPI [name] is a 82 y.o. male  Seen following ICD shock.  He had presented Christmas eve with abdominal pain.  Was found to have cholelithiasis and underwent lap chole; confirmed cholecystitis.   perioperative phase was complicated by intermittent atrial arrhythmias with rapid rates requiring IV diltiazem.  Device interrogation identified onset of atrial flutter 12/16  He has had persistent atrial flutter for the last couple of days determined by his ICD and supported by telemetry.  Rates became accelerated today while he was off telemetry.  Failed antitachycardia pacing gave rise to a shock therapy with the restoration of sinus rhythm (and termination of the atrial flutter)  This occurred during orthostatic vital signs   He has a history of atrial fibrillation but is not on anticoagulation because subdural hematoma on anticoagulation (notes found from neurosurgery--but no INR information and family doesn't recall)  He has a history of ischemic cardiomyopathy and prior MI remote bypass surgery 1995.  Most recent catheterization in 2012 demonstrated patent grafts.  Ejection fraction 15-20%  He has baseline modest dyspnea on exertion but has not changed.  No chest pain   History of VT storm treated with amiodarone which was poorly tolerated higher doses because of side effects; no VT since at least 2014    Past Medical History:  Diagnosis Date  . Atrial arrhythmia/a fibrillation    not on coumadin 2/2 subdural hematoma  . Automatic implantable cardiac defibrillator in situ    medtronic    . Automatic implantable cardioverter-defibrillator in situ   . CHF (congestive heart failure) ([place])   . Chronic systolic congestive heart failure ([place])   . Dysrhythmia   . Encounter for long-term (current) use of other medications    amiodarone  . H/O hiatal hernia   . Hyperlipidemia   . Hypertension   . Hypothyroidism   . Ischemic cardiomyopathy    status post CABG with patent vein grafts and an atretic LIMA to his ramus, January 2012  . Myocardial infarction [facility])    1995, non-STEMI January 2012  . Pacemaker   . Subdural hematoma (HCC)    on coumadin  . Thyroid disease    hypothyroid  . Ventricular fibrillation ([place])   . Ventricular tachycardia ([place])     VT Storm-January 2012- responded to Amiodarone      Surgical History:  Past Surgical History:  Procedure Laterality Date  . bilateral cranitomies for sub hematomas    . BYPASS GRAFT  1995   5  . CHOLECYSTECTOMY N/A 12/26/2017   Procedure: LAPAROSCOPIC CHOLECYSTECTOMY;  Surgeon: Ileana Roup, MD;  Location: [place];  Service: General;  Laterality: N/A;  . CORONARY ARTERY BYPASS GRAFT  1995   5  . IMPLANTABLE CARDIOVERTER DEFIBRILLATOR GENERATOR CHANGE N/A 03/28/2013   Procedure: IMPLANTABLE CARDIOVERTER DEFIBRILLATOR GENERATOR CHANGE;  Surgeon: Deboraha Sprang, MD;  Location: [facility] CATH LAB;  Service: Cardiovascular;  Laterality: N/A;  . INGUINAL HERNIA REPAIR Left 05/15/2014   Procedure: HERNIA REPAIR INGUINAL ADULT;  Surgeon: Zenovia Jarred, MD;  Location: [place];  Service: General;  Laterality: Left;  . INSERT /  REPLACE / REMOVE PACEMAKER  14    generator repl   . INSERTION OF MESH Left 05/15/2014   Procedure: INSERTION OF MESH;  Surgeon: Zenovia Jarred, MD;  Location: [place];  Service: General;  Laterality: Left;  . TONSILLECTOMY       Home Meds: Prior to Admission medications   Medication Sig Start Date End Date Taking? Authorizing Provider  amiodarone (PACERONE) 100 MG tablet Take 0.5 tablets (50 mg total) by  mouth daily. 11/30/17  Yes Deboraha Sprang, MD  aspirin EC 81 MG tablet Take 81 mg by mouth daily.   Yes [provider]  b complex vitamins tablet Take 1 tablet by mouth daily.   Yes [provider]  carvedilol (COREG) 6.25 MG tablet TAKE 1 TABLET BY MOUTH 2 TIMES DAILY WITH A MEAL. Patient taking differently: TAKE 1 TABLET (6.25 MG) BY MOUTH 2 TIMES DAILY WITH A MEAL. 07/02/17  Yes Deboraha Sprang, MD  Coenzyme Q10 (COQ10 PO) Take 1 capsule by mouth daily.   Yes [provider]  donepezil (ARICEPT) 5 MG tablet Take 5 mg by mouth daily.    Yes [provider]  fenofibrate 160 MG tablet Take 1 tablet (160 mg total) by mouth daily. 09/17/17  Yes Nahser, Wonda Cheng, MD  lisinopril (PRINIVIL,ZESTRIL) 5 MG tablet Take 1 tablet (5 mg total) by mouth daily. 08/14/17  Yes Nahser, Wonda Cheng, MD  LYCOPENE PO Take 1 capsule by mouth daily.   Yes [provider]  Multiple Vitamin (MULTIVITAMIN WITH MINERALS) TABS tablet Take 1 tablet by mouth daily.   Yes [provider]  niacin (SLO-NIACIN) 500 MG tablet Take 500 mg by mouth daily.    Yes [provider]  nitroGLYCERIN (NITROSTAT) 0.4 MG SL tablet Place 1 tablet (0.4 mg total) under the tongue every 5 (five) minutes as needed for chest pain. For chest pain x 3 doses Patient taking differently: Place 0.4 mg under the tongue every 5 (five) minutes as needed for chest pain (max 3 doses).  03/22/13  Yes Deboraha Sprang, MD  OVER THE COUNTER MEDICATION Take 1 tablet by mouth daily. "prostrate essentials"   Yes [provider]  spironolactone (ALDACTONE) 25 MG tablet Take 1 tablet (25 mg total) by mouth daily. 11/30/17  Yes Nahser, Wonda Cheng, MD  Tamsulosin HCl (FLOMAX) 0.4 MG CAPS Take 0.4 mg by mouth at bedtime.  03/31/11  Yes [provider]  finasteride (PROSCAR) 5 MG tablet Take 5 mg by mouth daily. 04/01/16   [provider]  levothyroxine (SYNTHROID, LEVOTHROID) 75 MCG tablet Take 75  mcg by mouth daily. 12/11/17   [provider]    Inpatient Medications:  . acetaminophen  1,000 mg Oral TID  . amiodarone  50 mg Oral Daily  . amoxicillin-clavulanate  1 tablet Oral Q12H  . carvedilol  6.25 mg Oral BID WC  . donepezil  5 mg Oral Daily  . feeding supplement  237 mL Oral BID BM  . fenofibrate  160 mg Oral Daily  . finasteride  5 mg Oral Daily  . levothyroxine  75 mcg Oral QAC breakfast  . lip balm   Topical BID  . psyllium  1 packet Oral Daily  . sodium chloride flush  3 mL Intravenous Q12H  . spironolactone  25 mg Oral Daily      Allergies: No Known Allergies  Social History   Socioeconomic History  . Marital status: Married    Spouse name: Not  on file  . Number of children: Not on file  . Years of education: Not on file  . Highest education level: Not on file  Social Needs  . Financial resource strain: Not on file  . Food insecurity - worry: Not on file  . Food insecurity - inability: Not on file  . Transportation needs - medical: Not on file  . Transportation needs - non-medical: Not on file  Occupational History  . Not on file  Tobacco Use  . Smoking status: Never Smoker  . Smokeless tobacco: Former Network engineer and Sexual Activity  . Alcohol use: No  . Drug use: No  . Sexual activity: Not on file  Other Topics Concern  . Not on file  Social History Narrative  . Not on file     Family History  Problem Relation Age of Onset  . Cerebral aneurysm Father   . Hypertension Father      ROS:  Please see the history of present illness.     All other systems reviewed and negative.    Physical Exam:  Blood pressure 125/68, pulse 81, temperature 99.2 F (37.3 C), temperature source Oral, resp. rate 20, height 5\' 11"  (1.803 m), weight 167 lb 12.3 oz (76.1 kg), SpO2 97 %. General: Well developed, well nourished male in no acute distress. Head: Normocephalic, atraumatic, sclera non-icteric, no xanthomas, nares are without  discharge. EENT: normal Lymph Nodes:  none Back: without scoliosis/kyphosis , no CVA tendersness Neck: Negative for carotid bruits. JVD 7-8. Lungs: Clear bilaterally to auscultation without wheezes, rales, or rhonchi. Breathing is unlabored. Heart: RRR with S1 S2.  2/6 systolic  murmur , rubs, or gallops appreciated. Abdomen: Soft, non-tender, non-distended with normoactive bowel sounds. No hepatomegaly. No rebound/guarding. No obvious abdominal masses. Msk:  Strength and tone appear normal for age. Extremities: No clubbing or cyanosis. No edema.  Distal pedal pulses are 2+ and equal bilaterally. Skin: Warm and Dry Neuro: Alert and oriented X 3. CN III-XII intact Grossly normal sensory and motor function . Psych:  Responds to questions appropriately with a normal affect.      Labs: Cardiac Enzymes No results for input(s): CKTOTAL, CKMB, TROPONINI in the last 72 hours. CBC Lab Results  Component Value Date   WBC 13.7 (H) 12/27/2017   HGB 13.5 12/27/2017   HCT 40.2 12/27/2017   MCV 92.6 12/27/2017   PLT 214 12/27/2017   PROTIME: No results for input(s): LABPROT, INR in the last 72 hours. Chemistry  Recent Labs  Lab 12/25/17 0538  12/28/17 1332  NA 138   < > 134*  K 3.6   < > 3.6  CL 108   < > 104  CO2 20*   < > 23  BUN 19   < > 18  CREATININE 1.27*   < > 1.07  CALCIUM 8.4*   < > 8.0*  PROT 5.7*  --   --   BILITOT 2.0*  --   --   ALKPHOS 29*  --   --   ALT 23  --   --   AST 28  --   --   GLUCOSE 118*   < > 142*   < > = values in this interval not displayed.   Lipids Lab Results  Component Value Date   CHOL 133 10/31/2014   HDL 40.90 10/31/2014   LDLCALC 81 10/31/2014   TRIG 55.0 10/31/2014   BNP No results found for: PROBNP Thyroid Function Tests: No results  for input(s): TSH, T4TOTAL, T3FREE, THYROIDAB in the last 72 hours.  Invalid input(s): FREET3    Miscellaneous No results found for: DDIMER  Radiology/Studies:  Nm Hepatobiliary Liver  Func  Addendum Date: 12/23/2017   ADDENDUM REPORT: 12/23/2017 16:06 ADDENDUM: These results were called by telephone at the time of interpretation on 12/23/2017 at 15:49 pm to Dr. Tana Coast , who verbally acknowledged these results. Electronically Signed   By: Fidela Salisbury M.D.   On: 12/23/2017 16:06   Result Date: 12/23/2017 CLINICAL DATA:  Right-sided abdominal pain.  History of gallstones. EXAM: NUCLEAR MEDICINE HEPATOBILIARY IMAGING TECHNIQUE: Sequential images of the abdomen were obtained out to 60 minutes following intravenous administration of radiopharmaceutical. RADIOPHARMACEUTICALS:  5.1 mCi Tc-33m Choletec IV. 3.0 mg morphine were given 90 at 90 minutes. COMPARISON:  Right upper quadrant ultrasound 12/21/2017 FINDINGS: Prompt uptake and biliary excretion of activity by the liver is seen. Large photopenic defect was seen overlying the right lobe of the liver. Gallbladder activity is not visualized, consistent with obstruction of the cystic duct. Biliary activity passes into small bowel, consistent with patent common bile duct. No significant change was seen after administration of morphine. IMPRESSION: Nonvisualization of the gallbladder, consistent with obstruction at the cystic duct level. Findings usually associated with acute cholecystitis. Patent common bile duct. Large photopenic defect is seen overlying the right lobe of the liver. This may represent an enlarged photopenic gallbladder obscuring the right lobe of the liver, interposition of the transverse colon obscuring the right lobe of the liver, or potentially an intrinsic liver mass. Electronically Signed: By: Fidela Salisbury M.D. On: 12/23/2017 15:28   Ct Abdomen Pelvis W Contrast  Result Date: 12/21/2017 CLINICAL DATA:  Abdominal distension since last night. Hernia repair. EXAM: CT ABDOMEN AND PELVIS WITH CONTRAST TECHNIQUE: Multidetector CT imaging of the abdomen and pelvis was performed using the standard protocol following  bolus administration of intravenous contrast. CONTRAST:  146mL ISOVUE-300 IOPAMIDOL (ISOVUE-300) INJECTION 61% COMPARISON:  None. FINDINGS: Lower chest: There is atelectasis in the lung bases with no suspicious infiltrates, nodules, or masses. Cardiomegaly is identified. Pacemaker leads are seen. Hepatobiliary: The left lobe of the liver is relatively atrophic. No hepatic masses are seen. The portal vein is patent. The gallbladder is distended. There is mild increased attenuation in the fat along the undersurface of the gallbladder best seen on coronal images. Cholelithiasis is noted. There is suggestion of stones in the cystic duct on coronal imaging. No common bile duct stones are seen on this study. However, there is mild prominence of the intrahepatic bile ducts on today's study. Pancreas: Unremarkable. No pancreatic ductal dilatation or surrounding inflammatory changes. Spleen: Normal in size without focal abnormality. Adrenals/Urinary Tract: Adrenal glands are normal. There is a cyst in the right kidney. No hydronephrosis or suspicious masses. No ureterectasis or ureteral stones. The bladder is normal. Stomach/Bowel: The stomach is normal. There is a duodenal diverticulum without evidence of inflammation. The small bowel is otherwise normal. Diffuse colonic diverticulosis is seen without focal diverticulitis. The appendix is well seen with no evidence of appendicitis. Vascular/Lymphatic: Atherosclerotic change is seen in the nonaneurysmal aorta, iliac vessels, and femoral vessels. No adenopathy. Reproductive: The prostate is enlarged. Other: No free air or free fluid. Mild increased attenuation in the fat of the abdomen may be due to volume overload. Musculoskeletal: No acute or significant osseous findings. IMPRESSION: 1. The gallbladder is distended and contains numerous stones. There is suggestion of stones in the cystic duct. There is mild fat stranding  along the inferior surface of the gallbladder on  coronal images. The findings are concerning for acute cholecystitis. Mild intrahepatic ductal prominence. Recommend clinical correlation and ultrasound. 2. Atherosclerotic change in the nonaneurysmal aorta. 3. Diverticulosis without focal diverticulitis. Electronically Signed   By: Dorise Bullion III M.D   On: 12/21/2017 09:59   US Abdomen Limited Ruq  Result Date: 12/21/2017 CLINICAL DATA:  Biliary colic. EXAM: ULTRASOUND ABDOMEN LIMITED RIGHT UPPER QUADRANT COMPARISON:  CT scan of same day. FINDINGS: Gallbladder: Cholelithiasis is noted with largest calculus measuring 2 cm. No significant gallbladder wall thickening or pericholecystic fluid is noted. No sonographic Murphy's sign is noted. Common bile duct: Diameter: 5.5 mm which is within normal limits. Liver: No focal lesion identified. Within normal limits in parenchymal echogenicity. Portal vein is patent on color Doppler imaging with normal direction of blood flow towards the liver. IMPRESSION: Cholelithiasis is noted without definite evidence of cholecystitis. If there is clinical concern for cholecystitis, HIDA scan is recommended for further evaluation. Electronically Signed   By: Marijo Conception, M.D.   On: 12/21/2017 12:18    EKG: sinus w P-synchronous/ AV  pacing    Assessment and Plan:  Atrial flutter-persistent  Ventricular tachycardia-storm-history  Implantable defibrillator-Medtronic  Acute cholecystitis status post cholecystectomy  Congestive heart failure-chronic-systolic-class II  Ischemic cardiomyopathy with prior bypass surgery  Subdural hematoma on Coumadin   Device interrogation demonstrates persistent atrial flutter that in the context of orthostatic vital sign measurements with an acceleration of the rate, failed ATP and then ICD discharge.  This resulted in restoration of sinus rhythm.  I have tried to find the information related to his with some dural hematoma that occurred on Coumadin.  2006 notes were  reviewed but the INR was not mentioned  Intracranial bleeding risks are known to be 30-50% with NOACs compared to Coumadin.  Furthermore, there are data from [place] and a high risk of bleeding cohort that a chads fast score of greater than or equal to 7 associated with a clinical benefit in patients being reexposed to Coumadin.  That   number is likely considerably less in the Caucasian population as well as in a population being exposed to NOACs.  Still however, looking back through August 2015 there is been no protracted episodes of atrial fibrillation of longer than 30-60 minutes and non-hardly at all in the last year.  Hence, I have suggested to the family that we keep an eye on the SCAF burden, (subclinical atrial fibrillation) prior to making a decision regarding the reinstitution of anticoagulation  He is bed rest debilitated.  We will need to mobilize him.  Now that he is in sinus rhythm the risks to his heart rate are much less.  His device has been reprogrammed to better since his atrial flutter as therapies have been initially withheld then atrial flutter was under sensed, ventricular tachycardia was then identified and therapies were delivered.  This is been reviewed with the patient and his family.    Virl Axe

## 2017-12-28 NOTE — Progress Notes (Signed)
  PROGRESS NOTE  [name] OEH:[id number] DOB: [birth year] DOA: 12/21/2017 PCP: Burnard Bunting, MD  Brief Narrative: 74yom PMH CAD, ICM, afib prsetend with abd pain after a heavy meal. CTs showed gallstones. Admitted for cholelithasis, sepsis, cholecystitis. Seen by surgery.  Assessment/Plan Sepsis POA secondary to acute cholecystitis. HIDA c/w cholecystitis. S/p cholecystectomy 12/29.  - sepsis resolved, finish abx today.   Afib, known, not on anticoagulation secondary to past SDH. - stable on tele. Continue BB and amiodarone.  AKI secondary to hypotension, sepsis - resolved  CAD, CABG, ICM chronic systolic CHF, ICD, VT on amiodarone. - stable  Hypothyroidism - continue levothyroxine 12/31  Dementia - will continue donepezil 12/31  - Doing well. Diet per surgery - PT eval today - d/c telemetry - home likely next 24 hours  DVT prophylaxis: SCDs Code Status: full Family Communication: at bedside 12/31 Disposition Plan: home    [name], MD  [facility] Direct contact: [phone] --[place] [place]  --[email or web address]; password [email or web address]  7PM-7AM contact night coverage as above 12/28/2017, 7:47 AM  LOS: 7 days   Consultants:  Cardiology   Procedures:  Laparoscopic cholecystectomy  Antimicrobials:  Vancomycin 12/22/17---> 12/24/17  Zosyn 12/21/17---> 12/31  Interval history/Subjective: Diet advanced yesterday.  Feels well, minimal pain. Breathing well.  Objective: Vitals:  Vitals:   12/28/17 0324 12/28/17 0735  BP: 106/73 121/83  Pulse: 85 79  Resp: 20 (!) 22  Temp: 97.9 F (36.6 C) 97.9 F (36.6 C)  SpO2: 97% 97%    Exam:  Constitutional:   . Appears calm and comfortable Eyes:  . pupils and irises appear normal ENMT:  . grossly normal hearing  Respiratory:  . CTA bilaterally, no w/r/r.  . Respiratory effort normal. N Cardiovascular:  . irregular, no m/r/g . No LE extremity edema   Psychiatric:  . Mental  status o Mood, affect appropriate  I have personally reviewed the following:   Labs: CBG stable 12/31  Scheduled Meds: . amiodarone  50 mg Oral Daily  . amoxicillin-clavulanate  1 tablet Oral Q12H  . carvedilol  6.25 mg Oral BID WC  . donepezil  5 mg Oral Daily  . fenofibrate  160 mg Oral Daily  . finasteride  5 mg Oral Daily  . levothyroxine  75 mcg Oral QAC breakfast   Continuous Infusions: . sodium chloride 60 mL/hr at 12/27/17 2033  . lactated ringers Stopped (12/26/17 1900)    Principal Problem:   Acute cholecystitis Active Problems:   Ischemic cardiomyopathy   Chronic systolic CHF (congestive heart failure) (HCC)   Atrial arrhythmia/a fibrillation   ICD (implantable cardioverter-defibrillator) in place   CAD (coronary artery disease)   Cholelithiasis   AKI (acute kidney injury) ([place])   Sepsis ([place])   Dementia   Paroxysmal atrial fibrillation ([place])   LOS: 7 days

## 2017-12-28 NOTE — Progress Notes (Signed)
   Dr. Caryl Comes notified of ICD fire. He will evaluate pt.   Daune Perch, AGNP-C [facility] 12/28/2017  3:40 PM Pager: [phone]

## 2017-12-28 NOTE — Progress Notes (Signed)
PT Cancellation Note  Patient Details Name: [name] MRN: [id number] DOB: 11/10/1935   Cancelled Treatment:    Reason Eval/Treat Not Completed: Medical issues which prohibited therapy. Pt with firing of ICD short while ago. Cardiology investigating.   [place] 12/28/2017, 3:21 PM [place]

## 2017-12-28 NOTE — Progress Notes (Signed)
[facility]  [address] [place], [place] [address] Phone: [phone]  FAX: [place] [phone] Jan 01, 1935  CARE TEAM:  PCP: Burnard Bunting, MD  Outpatient Care Team: Patient Care Team: Burnard Bunting, MD as PCP - General (Internal Medicine)  Inpatient Treatment Team: Treatment Team: Attending Provider: Samuella Cota, MD; Rounding Team: Edison Pace, Md, MD; Consulting Physician: Lbcardiology, Michae Kava, MD; Respiratory Therapist: Dede Query, RRT; Rounding Team: Sharyon Medicus, MD; Technician: Benson Setting, NT   Problem List:   Principal Problem:   Empyema of gallbladder s/p lap cholecystectomy 12/26/2017 Active Problems:   Ischemic cardiomyopathy   Chronic systolic CHF (congestive heart failure) ([place])   Atrial arrhythmia/a fibrillation   ICD (implantable cardioverter-defibrillator) in place   CAD (coronary artery disease)   Cholelithiasis   AKI (acute kidney injury) ([place])   Sepsis ([place])   Dementia   Paroxysmal atrial fibrillation ([place])   2 Days Post-Op  12/26/2017  POST-OPERATIVE DIAGNOSIS: Acute cholecystitis/cholelithiasis  PROCEDURE: Laparoscopic cholecystectomy  SURGEON: Sharon Mt. White, MD   Assessment  Recovering  Plan:  -continue drain with empyema  -ABx - consider x 5 d given empyema -Solid diet -stop IVF -improve PO pain control -OK to anticoagulate from GenSurg standpoint - defer to cardiology, TRH, and neurosurgery -VTE prophylaxis- SCDs, etc -mobilize as tolerated to help recovery.  GET HIM UP!!!  20 minutes spent in review, evaluation, examination, counseling, and coordination of care.  More than 50% of that time was spent in counseling.  I updated the patient's status to the patient and family.  Recommendations were made.  Questions were answered.  They expressed understanding & appreciation.   Adin Hector, M.D., F.A.C.S. Gastrointestinal and  Minimally Invasive Surgery [facility] [address], [place], [place] [address] [phone] Main / Paging   12/28/2017    Subjective: (Chief complaint)  Mildly sore. We will need to get up out of bed but therapy has not seen, and they do not know if it is safe No nausea or vomiting. Wife at bedside.   Objective:  Vital signs:  Vitals:   12/27/17 2318 12/28/17 0324 12/28/17 0735 12/28/17 1051  BP: 124/82 106/73 121/83 127/70  Pulse: 96 85 79 84  Resp: 20 20 (!) 22 20  Temp: 97.8 F (36.6 C) 97.9 F (36.6 C) 97.9 F (36.6 C) 99.2 F (37.3 C)  TempSrc: Oral Oral Oral Oral  SpO2: 96% 97% 97% 96%  Weight:      Height:        Last BM Date: 12/26/17  Intake/Output   Yesterday:  12/30 0701 - 12/31 0700 In: 2020 [P.O.:240; I.V.:1780] Out: 765 [Urine:550; Drains:215] This shift:  Total I/O In: 100 [Other:100] Out: 300 [Urine:300]  Bowel function:  Flatus: YES  BM:  No  Drain: Serosanguinous   Physical Exam:  General: Pt awake/alert/oriented x4 in no acute distress Eyes: PERRL, normal EOM.  Sclera clear.  No icterus Neuro: CN II-XII intact w/o focal sensory/motor deficits. Lymph: No head/neck/groin lymphadenopathy Psych:  No delerium/psychosis/paranoia HENT: Normocephalic, Mucus membranes moist.  No thrush Neck: Supple, No tracheal deviation Chest: No chest wall pain w good excursion CV:  Pulses intact.  Regular rhythm MS: Normal AROM mjr joints.  No obvious deformity  Abdomen: Soft.  Mildy distended.  Mildly tender at incisions only.  No evidence of peritonitis.  No incarcerated hernias.  Ext:  No deformity.  No mjr edema.  No cyanosis Skin: No petechiae / purpura  Results:   Labs: Results for orders placed or performed during the hospital encounter of 12/21/17 (from the past 48 hour(s))  Glucose, capillary     Status: Abnormal   Collection Time: 12/26/17 12:44 PM  Result Value Ref Range   Glucose-Capillary  152 (H) 65 - 99 mg/dL  Glucose, capillary     Status: Abnormal   Collection Time: 12/26/17  4:10 PM  Result Value Ref Range   Glucose-Capillary 133 (H) 65 - 99 mg/dL  Glucose, capillary     Status: Abnormal   Collection Time: 12/26/17 11:12 PM  Result Value Ref Range   Glucose-Capillary 137 (H) 65 - 99 mg/dL  Glucose, capillary     Status: Abnormal   Collection Time: 12/27/17  7:56 AM  Result Value Ref Range   Glucose-Capillary 157 (H) 65 - 99 mg/dL  CBC     Status: Abnormal   Collection Time: 12/27/17  9:34 AM  Result Value Ref Range   WBC 13.7 (H) 4.0 - 10.5 K/uL   RBC 4.34 4.22 - 5.81 MIL/uL   Hemoglobin 13.5 13.0 - 17.0 g/dL   HCT 40.2 39.0 - 52.0 %   MCV 92.6 78.0 - 100.0 fL   MCH 31.1 26.0 - 34.0 pg   MCHC 33.6 30.0 - 36.0 g/dL   RDW 14.3 11.5 - 15.5 %   Platelets 214 150 - 400 K/uL  Basic metabolic panel     Status: Abnormal   Collection Time: 12/27/17  9:34 AM  Result Value Ref Range   Sodium 138 135 - 145 mmol/L   Potassium 3.5 3.5 - 5.1 mmol/L   Chloride 108 101 - 111 mmol/L   CO2 23 22 - 32 mmol/L   Glucose, Bld 176 (H) 65 - 99 mg/dL   BUN 17 6 - 20 mg/dL   Creatinine, Ser 1.08 0.61 - 1.24 mg/dL   Calcium 7.9 (L) 8.9 - 10.3 mg/dL   GFR calc non Af Amer >60 >60 mL/min   GFR calc Af Amer >60 >60 mL/min    Comment: (NOTE) The eGFR has been calculated using the CKD EPI equation. This calculation has not been validated in all clinical situations. eGFR's persistently <60 mL/min signify possible Chronic Kidney Disease.    Anion gap 7 5 - 15  Glucose, capillary     Status: Abnormal   Collection Time: 12/27/17  3:42 PM  Result Value Ref Range   Glucose-Capillary 190 (H) 65 - 99 mg/dL  Glucose, capillary     Status: Abnormal   Collection Time: 12/28/17 12:15 AM  Result Value Ref Range   Glucose-Capillary 152 (H) 65 - 99 mg/dL  Glucose, capillary     Status: Abnormal   Collection Time: 12/28/17  7:34 AM  Result Value Ref Range   Glucose-Capillary 119 (H) 65 -  99 mg/dL    Imaging / Studies: No results found.  Medications / Allergies: per chart  Antibiotics: Anti-infectives (From admission, onward)   Start     Dose/Rate Route Frequency Ordered Stop   12/27/17 1300  amoxicillin-clavulanate (AUGMENTIN) 875-125 MG per tablet 1 tablet     1 tablet Oral Every 12 hours 12/27/17 1238     12/24/17 0900  vancomycin (VANCOCIN) IVPB 750 mg/150 ml premix  Status:  Discontinued     750 mg 150 mL/hr over 60 Minutes Intravenous Every 12 hours 12/24/17 0852 12/24/17 0854   12/22/17 1300  vancomycin (VANCOCIN) IVPB 1000 mg/200 mL premix  Status:  Discontinued     1,000 mg 200 mL/hr over 60 Minutes Intravenous Every 24 hours 12/22/17 1257 12/24/17 0852   12/21/17 2330  piperacillin-tazobactam (ZOSYN) IVPB 3.375 g  Status:  Discontinued     3.375 g 12.5 mL/hr over 240 Minutes Intravenous Every 8 hours 12/21/17 2302 12/27/17 1238        Note: Portions of this report may have been transcribed using voice recognition software. Every effort was made to ensure accuracy; however, inadvertent computerized transcription errors may be present.   Any transcriptional errors that result from this process are unintentional.     Adin Hector, M.D., F.A.C.S. Gastrointestinal and Minimally Invasive Surgery [facility] [address] [place], [place] [address] [phone] Main / Paging   12/28/2017

## 2017-12-28 NOTE — Progress Notes (Addendum)
Notified by NT that pt's ICD fired after obtaining orthostatic vitals (pt's body jerked while sitting on the side of the bed). Pt states he did not have any dizziness. EKG completed. Cardiology notified and came to bedside. Telemetry order resumed. Pt currently asymptomatic.

## 2017-12-28 NOTE — Progress Notes (Signed)
Pt is doing good so far, no any complaints of pain, JP drain emptied, Condom catheter changed, pt is resting well now, family member in bed side, will continue to monitor

## 2017-12-28 NOTE — Progress Notes (Addendum)
   I was paged by nurse to report that pt's ICD had just fired. I went to assess pt. He is not on telemetry so I cannot see what rhythm was. [name] states that he had just stood up to do orthostatic VS. Just after he sat back down he felt the shock. He has been shocked before, years ago and knows what it feels like. He denies having had any lightheadedness, near syncope, palpitations or chest discomfort prior to the shock. His orthostatics were positive.   EKG shows sinus rhythm with PACs, RBBB, no significant change from his previous when in SR. Last EKG, he was in atrial flutter with RVR. His potasium was normal this am. Will recheck along with magnesium. I will have ICD interrogated and discuss with EP. Will have telemetry placed to monitor for further arrhythmias. This may have been triggered by afib with RVR or pt has hx of VT storm, on low dose amiodarone as he did not tolerate higher doses.   Daune Perch, AGNP-C [facility] 12/28/2017  1:21 PM Pager: [phone]

## 2017-12-28 NOTE — Progress Notes (Signed)
Pt reviewed by cardiology and now ok to ambulate. Pt assisted to chair to eat dinner. No symptoms present. Will continue to mobilize.

## 2017-12-29 DIAGNOSIS — N179 Acute kidney failure, unspecified: Secondary | ICD-10-CM

## 2017-12-29 LAB — GLUCOSE, CAPILLARY
GLUCOSE-CAPILLARY: 103 mg/dL — AB (ref 65–99)
GLUCOSE-CAPILLARY: 115 mg/dL — AB (ref 65–99)
Glucose-Capillary: 107 mg/dL — ABNORMAL HIGH (ref 65–99)
Glucose-Capillary: 97 mg/dL (ref 65–99)

## 2017-12-29 NOTE — Evaluation (Signed)
Occupational Therapy Evaluation Patient Details Name: [name] MRN: [id number] DOB: 1935-02-25 Today's Date: 12/29/2017    History of Present Illness Pt is an 83 y.o. male who presented to the ED with abdominal pain and was admitted with sepsis secondary to cholecystitis now s/p cholecystectomy 12/29. PMH significant for atrial fibrillation, automatic implantable cardiac defibrillator in situ, automatic implantable cardioverter-defibrillator in situ, CHF, subdural hematoma, hypertension, hyperlipidemia, hypothyroidism, ischemic cardiomyopath s/p CABG 2012, ventricular fibrillation, ventricular tachycardia.   Clinical Impression   PTA, pt was independent with ADL and functional mobility and maintaining a very active lifestyle. He currently requires overall min assist for safety with toilet transfers without RW, min guard assist for LB ADL, and min guard assist for standing grooming tasks. He did demonstrate slightly decreased short-term memory this session but did problem solve well. Pt would benefit from continued OT services while admitted to improve independence and safety with ADL and functional mobility prior to returning home with assistance from his wife. Feel pt will likely progress well and not need OT follow-up but will continue to update recommendations based on progress.     Follow Up Recommendations  Supervision/Assistance - 24 hour;No OT follow up    Equipment Recommendations  3 in 1 bedside commode    Recommendations for Other Services       Precautions / Restrictions Precautions Precautions: Fall Precaution Comments: Pt with R shoulder pathology and history of broken R foot Restrictions Weight Bearing Restrictions: No      Mobility Bed Mobility Overal bed mobility: Needs Assistance Bed Mobility: Supine to Sit     Supine to sit: Min guard     General bed mobility comments: Increased time and effort.   Transfers Overall transfer level: Needs  assistance Equipment used: None;1 person hand held assist Transfers: Sit to/from Stand Sit to Stand: Min guard         General transfer comment: Min guard assist for safety and steadiness.     Balance Overall balance assessment: Needs assistance Sitting-balance support: No upper extremity supported;Feet supported Sitting balance-Leahy Scale: Fair     Standing balance support: No upper extremity supported;Bilateral upper extremity supported;During functional activity Standing balance-Leahy Scale: Fair Standing balance comment: Able to statically stand at sink with min guard assist but notable dynamic standing deficits.                            ADL either performed or assessed with clinical judgement   ADL Overall ADL's : Needs assistance/impaired Eating/Feeding: Set up;Sitting   Grooming: Min guard;Standing   Upper Body Bathing: Set up;Sitting   Lower Body Bathing: Min guard;Sit to/from stand   Upper Body Dressing : Set up;Sitting   Lower Body Dressing: Min guard;Sit to/from stand   Toilet Transfer: Minimal assistance;Ambulation Toilet Transfer Details (indicate cue type and reason): Simulated with sit<>stand followed by functional mobility in room and hallway Toileting- Clothing Manipulation and Hygiene: Min guard;Sit to/from stand       Functional mobility during ADLs: Minimal assistance General ADL Comments: Pt ambulated in hallway without RW in hallway and in room as he did not use AD at baseline.      Vision Patient Visual Report: No change from baseline Vision Assessment?: No apparent visual deficits     Perception     Praxis      Pertinent Vitals/Pain Pain Assessment: No/denies pain     Hand Dominance Right   Extremity/Trunk Assessment Upper Extremity Assessment  Upper Extremity Assessment: Generalized weakness   Lower Extremity Assessment Lower Extremity Assessment: Defer to PT evaluation RLE Deficits / Details: History of "broken  foot" and noted decreased AROM.        Communication     Cognition Arousal/Alertness: Awake/alert Behavior During Therapy: WFL for tasks assessed/performed Overall Cognitive Status: Impaired/Different from baseline Area of Impairment: Memory                     Memory: Decreased short-term memory         General Comments: Pt repeating himself multiple times during session.    General Comments  Pt with O2 desaturation earlier during ambulation and RN requesting pt to stay on 2L O2 throughout session.     Exercises     Shoulder Instructions      Home Living Family/patient expects to be discharged to:: Private residence Living Arrangements: Spouse/significant other Available Help at Discharge: Family;Available 24 hours/day Type of Home: House Home Access: Stairs to enter [employer] of Steps: 1 Entrance Stairs-Rails: None Home Layout: Two level;Bed/bath upstairs     Bathroom Shower/Tub: Teacher, early years/pre: Standard     Home Equipment: None          Prior Functioning/Environment Level of Independence: Independent        Comments: Very active lifestyle        OT Problem List: Decreased strength;Decreased activity tolerance;Impaired balance (sitting and/or standing);Decreased safety awareness;Decreased knowledge of use of DME or AE;Decreased knowledge of precautions      OT Treatment/Interventions: Self-care/ADL training;Therapeutic exercise;Energy conservation;DME and/or AE instruction;Therapeutic activities;Patient/family education;Balance training    OT Goals(Current goals can be found in the care plan section) Acute Rehab OT Goals Patient Stated Goal: to go home tomorrow OT Goal Formulation: With patient/family Time For Goal Achievement: 01/12/18 Potential to Achieve Goals: Good ADL Goals Pt Will Perform Grooming: with modified independence;standing Pt Will Perform Lower Body Dressing: with modified independence;sit  to/from stand Pt Will Transfer to Toilet: with modified independence;ambulating;bedside commode(BSC over toilet; with RW) Pt Will Perform Toileting - Clothing Manipulation and hygiene: with modified independence;sit to/from stand Pt Will Perform Tub/Shower Transfer: with supervision;3 in 1;rolling walker;ambulating;Tub transfer(with or without RW)  OT Frequency: Min 2X/week   Barriers to D/C:            Co-evaluation              AM-PAC PT "6 Clicks" Daily Activity     Outcome Measure Help from another person eating meals?: A Little Help from another person taking care of personal grooming?: A Little Help from another person toileting, which includes using toliet, bedpan, or urinal?: A Little Help from another person bathing (including washing, rinsing, drying)?: A Little Help from another person to put on and taking off regular upper body clothing?: A Little Help from another person to put on and taking off regular lower body clothing?: A Little 6 Click Score: 18   End of Session Equipment Utilized During Treatment: Gait belt;Oxygen Nurse Communication: Mobility status  Activity Tolerance: Patient tolerated treatment well Patient left: in bed;with call bell/phone within reach;with family/visitor present  OT Visit Diagnosis: Other abnormalities of gait and mobility (R26.89);Muscle weakness (generalized) (M62.81)                Time: 6144-3154 OT Time Calculation (min): 20 min Charges:  OT General Charges $OT Visit: 1 Visit OT Evaluation $OT Eval Moderate Complexity: 1 Mod G-Codes:     [employer]  Eusebio Me, MS OTR/L  Pager: [place] 12/29/2017, 3:11 PM

## 2017-12-29 NOTE — Progress Notes (Signed)
PROGRESS NOTE  [name] GOT:[id number] DOB: 1935-11-23 DOA: 12/21/2017 PCP: Burnard Bunting, MD  Brief Narrative: 52yom PMH CAD, ICM, afib prsetend with abd pain after a heavy meal. CTs showed gallstones. Admitted for cholelithasis, sepsis, cholecystitis. Seen by surgery. Subsequently s/p cholecystectomy 12/29; hospitalization prolonged preop by afib RVR, consideration of cholecystostomy vs cholecystectomy and postop by ICD shock. Now doing well, plan PT eval, home on PO abx likely next 1-2 days.  Assessment/Plan Sepsis POA secondary to acute cholecystitis. HIDA c/w cholecystitis. Seen by surgery. S/p cholecystectomy 59/74 without complication.  - sepsis resolved. Finish abx 1/4 per surgery  Afib, known, not on anticoagulation secondary to past SDH. - had arrhythmia yesterday, s/p ICD shock. EP saw and rec against anticoagulation for now. Continue BB and amiodarone per EP. Cardiology w/o further recs today.  AKI secondary to hypotension, sepsis - resolved  CAD, CABG, ICM chronic systolic CHF, ICD, VT on amiodarone. - remains stable  Hypothyroidism - continue levothyroxine 1/1  Dementia - will continue donepezil 1/1  Aortic atherosclerosis   Improving, follow today; await PT eval. Likely d/c 1/2.  DVT prophylaxis: SCDs Code Status: full Family Communication: at bedside 12/31 Disposition Plan: home    [name], MD  [facility] Direct contact: [phone] --[place] [place]  --[email or web address]; password [email or web address]  7PM-7AM contact night coverage as above 12/29/2017, 11:39 AM  LOS: 8 days   Consultants:  Cardiology   Procedures:  Laparoscopic cholecystectomy  Antimicrobials:  Vancomycin 12/22/17---> 12/24/17  Zosyn 12/21/17---> 12/31  Augmentin 12/31 >>   Interval history/Subjective: General surgery continued drain and rec 5 days abx, advanced diet. Patient had ICD discharge, seen by EP, ICD was reprogrammed. EP rec against anticoagulation. In  SR.  Cardiology this AM has no further recs. Hypoxic with ambulation. Surgery is continuing drain.  Patient feels well, no complaints.  Objective: Vitals:  Vitals:   12/28/17 2200 12/29/17 0557  BP:  (!) 119/53  Pulse:  72  Resp:    Temp:  97.8 F (36.6 C)  SpO2: 92% 90%    Exam:  Constitutional:   . Appears calm and comfortable Respiratory:  . CTA bilaterally, no w/r/r.  . Respiratory effort normal.  Cardiovascular:  . RRR, no m/r/g . No LE extremity edema   Psychiatric:  . Mental status o Mood, affect appropriate  I have personally reviewed the following:   Labs: CXR NAD  Scheduled Meds: . acetaminophen  1,000 mg Oral TID  . amiodarone  50 mg Oral Daily  . amoxicillin-clavulanate  1 tablet Oral Q12H  . carvedilol  6.25 mg Oral BID WC  . donepezil  5 mg Oral Daily  . feeding supplement  237 mL Oral BID BM  . fenofibrate  160 mg Oral Daily  . finasteride  5 mg Oral Daily  . levothyroxine  75 mcg Oral QAC breakfast  . lip balm   Topical BID  . psyllium  1 packet Oral Daily  . sodium chloride flush  3 mL Intravenous Q12H  . spironolactone  25 mg Oral Daily   Continuous Infusions: . sodium chloride    . methocarbamol (ROBAXIN)  IV      Principal Problem:   Empyema of gallbladder s/p lap cholecystectomy 12/26/2017 Active Problems:   Ischemic cardiomyopathy   Chronic systolic CHF (congestive heart failure) ([facility])   Atrial arrhythmia/a fibrillation   ICD (implantable cardioverter-defibrillator) in place   CAD (coronary artery disease)   Cholelithiasis   AKI (acute kidney injury) ([place])   Sepsis (  [place])   Dementia   Paroxysmal atrial fibrillation ([place])   LOS: 8 days

## 2017-12-29 NOTE — Progress Notes (Signed)
Chart reviewed - maintaining sinus with intermittent pacing on telemetry. Per Dr. Caryl Comes, he wants to monitor burden of atrial flutter going forward before considering restarting anticoagulation.   No further suggestions at this time.   Pixie Casino, MD, [facility], [place] Director of the Advanced Lipid Disorders &  Cardiovascular Risk Reduction Clinic Attending Cardiologist  Direct Dial: [phone]  Fax: [phone]  Website:  [email or web address]
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
## 2017-12-29 NOTE — Progress Notes (Signed)
Patient ambulated in hall with walker for 300 ft. Oxygen saturations on room air while ambulating did drop to 87%. 2L O2 [place] replaced. Will continue to ambulate today.

## 2017-12-29 NOTE — Progress Notes (Signed)
Addition to PT Note:    SATURATION QUALIFICATIONS: (This note is used to comply with regulatory documentation for home oxygen)  Patient Saturations on Room Air at Rest = 91%  Patient Saturations on Room Air while Ambulating = 80%  Patient Saturations on 2 Liters of oxygen while Ambulating = 93%  Please briefly explain why patient needs home oxygen: pt with O2 desaturation when ambulating on RA.   Leighton Roach, [place]  [phone]

## 2017-12-29 NOTE — Progress Notes (Signed)
3 Days Post-Op   Subjective/Chief Complaint: Pt wanting to ambulate in hall Abd pain better   Objective: Vital signs in last 24 hours: Temp:  [97.8 F (36.6 C)-99.2 F (37.3 C)] 97.8 F (36.6 C) (01/01 0557) Pulse Rate:  [72-84] 72 (01/01 0557) Resp:  [18-20] 18 (12/31 2032) BP: (119-127)/(53-70) 119/53 (01/01 0557) SpO2:  [89 %-97 %] 90 % (01/01 0557) Weight:  [77.6 kg (171 lb)] 77.6 kg (171 lb) (01/01 0557) Last BM Date: 12/28/17  Intake/Output from previous day: 12/31 0701 - 01/01 0700 In: 440 [P.O.:440] Out: 1235 [Urine:1025; Drains:210] Intake/Output this shift: Total I/O In: 240 [P.O.:240] Out: -   General appearance: alert and cooperative GI: soft, non-tender; bowel sounds normal; no masses,  no organomegaly and JP SS  Lab Results:  Recent Labs    12/27/17 0934  WBC 13.7*  HGB 13.5  HCT 40.2  PLT 214   BMET Recent Labs    12/27/17 0934 12/28/17 1332  NA 138 134*  K 3.5 3.6  CL 108 104  CO2 23 23  GLUCOSE 176* 142*  BUN 17 18  CREATININE 1.08 1.07  CALCIUM 7.9* 8.0*   PT/INR No results for input(s): LABPROT, INR in the last 72 hours. ABG No results for input(s): PHART, HCO3 in the last 72 hours.  Invalid input(s): PCO2, PO2  Studies/Results: Dg Chest 1 View  Result Date: 12/28/2017 CLINICAL DATA:  Initial evaluation for acute shortness of breath. EXAM: CHEST 1 VIEW COMPARISON:  Prior radiograph from 05/19/2016. FINDINGS: Left-sided pacemaker/ AICD in place. Median sternotomy wires underlying CABG markers and surgical clips. Cardiomegaly, stable from previous. Mediastinal silhouette normal. Aortic atherosclerosis. Lungs are mildly hypoinflated. Elevation of the right hemidiaphragm with associated right basilar opacity, likely atelectasis. Small layering left pleural effusion. Associated left basilar opacity may reflect atelectasis or infiltrate. No other focal airspace disease. No pulmonary edema. No pneumothorax. Possible drain overlies the  right upper quadrant. No acute osseous abnormality. Remotely healed right clavicular fracture noted. IMPRESSION: 1. Shallow lung inflation with small left pleural effusion. Bibasilar opacities favored to reflect atelectasis, although infiltrates could be considered in the correct clinical setting. 2. Stable cardiomegaly with sequelae of prior CABG. 3. Atherosclerosis. Electronically Signed   By: Jeannine Boga M.D.   On: 12/28/2017 22:38    Anti-infectives: Anti-infectives (From admission, onward)   Start     Dose/Rate Route Frequency Ordered Stop   12/27/17 1300  amoxicillin-clavulanate (AUGMENTIN) 875-125 MG per tablet 1 tablet     1 tablet Oral Every 12 hours 12/27/17 1238     12/24/17 0900  vancomycin (VANCOCIN) IVPB 750 mg/150 ml premix  Status:  Discontinued     750 mg 150 mL/hr over 60 Minutes Intravenous Every 12 hours 12/24/17 0852 12/24/17 0854   12/22/17 1300  vancomycin (VANCOCIN) IVPB 1000 mg/200 mL premix  Status:  Discontinued     1,000 mg 200 mL/hr over 60 Minutes Intravenous Every 24 hours 12/22/17 1257 12/24/17 0852   12/21/17 2330  piperacillin-tazobactam (ZOSYN) IVPB 3.375 g  Status:  Discontinued     3.375 g 12.5 mL/hr over 240 Minutes Intravenous Every 8 hours 12/21/17 2302 12/27/17 1238      Assessment/Plan: s/p Procedure(s): LAPAROSCOPIC CHOLECYSTECTOMY (N/A) Con't to mobilize Abx Con't drain  LOS: 8 days    [name]., [place] 12/29/2017

## 2017-12-30 ENCOUNTER — Inpatient Hospital Stay (HOSPITAL_COMMUNITY): Payer: Medicare Other

## 2017-12-30 DIAGNOSIS — A419 Sepsis, unspecified organism: Principal | ICD-10-CM

## 2017-12-30 LAB — GLUCOSE, CAPILLARY: Glucose-Capillary: 101 mg/dL — ABNORMAL HIGH (ref 65–99)

## 2017-12-30 MED ORDER — ENSURE SURGERY PO LIQD: 237.0000 mL | Freq: Two times a day (BID) | ORAL | 0 refills | Status: AC

## 2017-12-30 MED ORDER — HYDROCORTISONE 2.5 % RE CREA: 1.0000 "application " | TOPICAL_CREAM | Freq: Four times a day (QID) | RECTAL | 0 refills | Status: DC | PRN

## 2017-12-30 MED ORDER — ONDANSETRON HCL 4 MG PO TABS: 4.0000 mg | ORAL_TABLET | Freq: Four times a day (QID) | ORAL | 0 refills | Status: DC | PRN

## 2017-12-30 MED ORDER — METHOCARBAMOL 500 MG PO TABS: 1000.0000 mg | ORAL_TABLET | Freq: Four times a day (QID) | ORAL | 0 refills | Status: DC | PRN

## 2017-12-30 MED ORDER — AMOXICILLIN-POT CLAVULANATE 875-125 MG PO TABS: 1.0000 | ORAL_TABLET | Freq: Two times a day (BID) | ORAL | 0 refills | Status: AC

## 2017-12-30 MED ORDER — TRAMADOL HCL 50 MG PO TABS: 50.0000 mg | ORAL_TABLET | Freq: Four times a day (QID) | ORAL | 0 refills | Status: DC | PRN

## 2017-12-30 MED ORDER — LEVOTHYROXINE SODIUM 75 MCG PO TABS: 75.0000 ug | ORAL_TABLET | Freq: Every day | ORAL | 3 refills | Status: AC

## 2017-12-30 NOTE — Progress Notes (Signed)
Patient ID: [name], male   DOB: [birth year], 83 y.o.   MRN: [id number] OK for D/C from our standpoint. We will arrange F/U with Dr. Dema Severin next week for JP removal. I spoke with his family.  [name], MD, MPH, FACS Trauma: [phone] General Surgery: [phone]

## 2017-12-30 NOTE — Progress Notes (Signed)
SATURATION QUALIFICATIONS: (This note is used to comply with regulatory documentation for home oxygen)  Patient Saturations on Room Air at Rest = 94  Patient Saturations on Room Air while Ambulating = 80  Patient Saturations on 2L 95 while ambulating  Please briefly explain why patient needs home oxygen: Oxygen saturation is dropping while ambulating.
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
## 2017-12-30 NOTE — Consult Note (Signed)
   [facility] CM Inpatient Consult   12/30/2017  [name] Mar 30, 1935 [id number]  Patient screened for potential [place] [facility] services. Patient is in the [place] of the [place] [facility] services under patient's Medicare  plan. Met with the patient and his wife regarding the needs for community care management services.  They endorse Dr. Burnard Bunting as the primary care provider.  He states he has been healthy walks daily.  He has [place] for home health and oxygen [new]. A brochure, 24 hour nurse advise line and contact information was given. Will provide general EMMI follow up calls,  For questions contact:   Natividad Brood, RN BSN [facility] Liaison  [phone] business mobile phone Toll free office [phone]

## 2017-12-30 NOTE — Progress Notes (Signed)
Physical Therapy Treatment Patient Details Name: [name] MRN: [id number] DOB: September 07, 1935 Today's Date: 12/30/2017    History of Present Illness Pt is an 83 y.o. male who presented to the ED with abdominal pain and was admitted with sepsis secondary to cholecystitis now s/p cholecystectomy 12/29. PMH significant for atrial fibrillation, automatic implantable cardiac defibrillator in situ, automatic implantable cardioverter-defibrillator in situ, CHF, subdural hematoma, hypertension, hyperlipidemia, hypothyroidism, ischemic cardiomyopath s/p CABG 2012, ventricular fibrillation, ventricular tachycardia.    PT Comments    Patient received up in chair with family present, pleasant and willing to work with skilled PT services this afternoon. He is able to perform functional transfers and gait approximately 351f with min guard due to mild drifting L/R and mild general unsteadiness while ambulating in hallway; 2LPM O2 was used for gait this afternoon, no signs of hypoxia or dyspnea noted. He and his wife were educated that it will be safest for him to use a walker for mobility at home due to this mild unsteadiness. Patient left up in chair with all needs met and questions addressed this afternoon.    Follow Up Recommendations  Home health PT     Equipment Recommendations  None recommended by PT    Recommendations for Other Services       Precautions / Restrictions Precautions Precautions: Fall Precaution Comments: Pt with R shoulder pathology and history of broken R foot Restrictions Weight Bearing Restrictions: No Other Position/Activity Restrictions: JP drain    Mobility  Bed Mobility               General bed mobility comments: DNT, received up in chair   Transfers Overall transfer level: Needs assistance Equipment used: None Transfers: Sit to/from Stand Sit to Stand: Min guard         General transfer comment: min guard for safety due to mild unsteadiness    Ambulation/Gait Ambulation/Gait assistance: Min guard Ambulation Distance (Feet): 300 Feet Assistive device: None Gait Pattern/deviations: Step-through pattern;Drifts right/left;Trunk flexed     General Gait Details: able to easily ambulate approximately 305fwith no device, 2LPM O2; mild unsteadiness but no fatigue or signs of dyspnea/hypoxia noted   Stairs            Wheelchair Mobility    Modified Rankin (Stroke Patients Only)       Balance Overall balance assessment: Needs assistance Sitting-balance support: Bilateral upper extremity supported;Feet supported Sitting balance-Leahy Scale: Good     Standing balance support: During functional activity Standing balance-Leahy Scale: Fair                              Cognition Arousal/Alertness: Awake/alert Behavior During Therapy: WFL for tasks assessed/performed Overall Cognitive Status: Within Functional Limits for tasks assessed                                 General Comments: wife present, patient very pleasant and interactive with PT       Exercises      General Comments        Pertinent Vitals/Pain Pain Assessment: Faces Faces Pain Scale: No hurt Pain Intervention(s): Monitored during session;Limited activity within patient's tolerance    Home Living                      Prior Function  PT Goals (current goals can now be found in the care plan section) Acute Rehab PT Goals Patient Stated Goal: to go home PT Goal Formulation: With patient Time For Goal Achievement: 01/12/18 Potential to Achieve Goals: Good Progress towards PT goals: Progressing toward goals    Frequency    Min 3X/week      PT Plan Current plan remains appropriate    Co-evaluation              AM-PAC PT "6 Clicks" Daily Activity  Outcome Measure  Difficulty turning over in bed (including adjusting bedclothes, sheets and blankets)?: Unable Difficulty moving  from lying on back to sitting on the side of the bed? : Unable Difficulty sitting down on and standing up from a chair with arms (e.g., wheelchair, bedside commode, etc,.)?: Unable Help needed moving to and from a bed to chair (including a wheelchair)?: A Little Help needed walking in hospital room?: A Little Help needed climbing 3-5 steps with a railing? : A Little 6 Click Score: 12    End of Session Equipment Utilized During Treatment: Gait belt Activity Tolerance: Patient tolerated treatment well Patient left: in chair;with family/visitor present;with call bell/phone within reach   PT Visit Diagnosis: Unsteadiness on feet (R26.81);Muscle weakness (generalized) (M62.81);Difficulty in walking, not elsewhere classified (R26.2)     Time: 2751-7001 PT Time Calculation (min) (ACUTE ONLY): 16 min  Charges:  $Gait Training: 8-22 mins                    G Codes:  Functional Assessment Tool Used: AM-PAC 6 Clicks Basic Mobility;Clinical judgement    Deniece Ree PT, DPT, CBIS  Supplemental Physical Therapist [place]

## 2017-12-30 NOTE — Progress Notes (Signed)
Progress Note  Patient Name: RONDEY FALLEN Date of Encounter: 12/30/2017  Primary Cardiologist:  Nita Sickle   Subjective   Still weak had BM yesterday no cardiac complaints   Inpatient Medications    Scheduled Meds: . acetaminophen  1,000 mg Oral TID  . amiodarone  50 mg Oral Daily  . amoxicillin-clavulanate  1 tablet Oral Q12H  . carvedilol  6.25 mg Oral BID WC  . donepezil  5 mg Oral Daily  . feeding supplement  237 mL Oral BID BM  . fenofibrate  160 mg Oral Daily  . finasteride  5 mg Oral Daily  . levothyroxine  75 mcg Oral QAC breakfast  . lip balm   Topical BID  . psyllium  1 packet Oral Daily  . sodium chloride flush  3 mL Intravenous Q12H  . spironolactone  25 mg Oral Daily   Continuous Infusions: . sodium chloride     PRN Meds: sodium chloride, alum & mag hydroxide-simeth, bisacodyl, diphenhydrAMINE, diphenhydrAMINE, guaiFENesin-dextromethorphan, hydrocortisone, hydrocortisone cream, magic mouthwash, menthol-cetylpyridinium, methocarbamol, nitroGLYCERIN, ondansetron **OR** ondansetron (ZOFRAN) IV, phenol, sodium chloride flush, traMADol   Vital Signs    Vitals:   12/30/17 0009 12/30/17 0500 12/30/17 0753 12/30/17 0802  BP: (!) 111/58 113/61 131/60 116/70  Pulse: 64 68 70 64  Resp: 18 18  18   Temp: 98.1 F (36.7 C) 98.6 F (37 C)  98.3 F (36.8 C)  TempSrc: Oral Oral  Oral  SpO2: 95% 95% 97% 95%  Weight:  170 lb (77.1 kg)    Height:        Intake/Output Summary (Last 24 hours) at 12/30/2017 0821 Last data filed at 12/30/2017 5852 Gross per 24 hour  Intake 480 ml  Output 895 ml  Net -415 ml   Filed Weights   12/26/17 0410 12/29/17 0557 12/30/17 0500  Weight: 167 lb 12.3 oz (76.1 kg) 171 lb (77.6 kg) 170 lb (77.1 kg)    Telemetry    Atrial for ablation with controlled ventricular response- Personally Reviewed  ECG    Fibrillation- Personally Reviewed  Physical Exam   BP 116/70 (BP Location: Left Arm)   Pulse 64   Temp 98.3 F (36.8  C) (Oral)   Resp 18   Ht 5\' 11"  (1.803 m)   Wt 170 lb (77.1 kg) Comment: scale c  SpO2 95%   BMI 23.71 kg/m  Affect appropriate Healthy:  appears stated age HEENT: normal Neck supple with no adenopathy JVP normal no bruits no thyromegaly Lungs clear with no wheezing and good diaphragmatic motion Heart:  S1/S2 no murmur, no rub, gallop or click PMI normal Abdomen: JP drain still in BS positive less distension  Distal pulses intact with no bruits No edema Neuro non-focal Skin warm and dry No muscular weakness   Labs    Chemistry Recent Labs  Lab 12/24/17 0613 12/25/17 0538 12/26/17 0830 12/27/17 0934 12/28/17 1332  NA 137 138 139 138 134*  K 3.6 3.6 3.2* 3.5 3.6  CL 107 108 109 108 104  CO2 20* 20* 22 23 23   GLUCOSE 104* 118* 111* 176* 142*  BUN 22* 19 16 17 18   CREATININE 1.34* 1.27* 1.07 1.08 1.07  CALCIUM 8.2* 8.4* 8.0* 7.9* 8.0*  PROT 5.5* 5.7*  --   --   --   ALBUMIN 2.5* 2.5*  --   --   --   AST 25 28  --   --   --   ALT 21 23  --   --   --  ALKPHOS 24* 29*  --   --   --   BILITOT 1.5* 2.0*  --   --   --   GFRNONAA 48* 52* >60 >60 >60  GFRAA 56* 60* >60 >60 >60  ANIONGAP 10 10 8 7 7      Hematology Recent Labs  Lab 12/24/17 0613 12/25/17 0538 12/27/17 0934  WBC 17.5* 18.5* 13.7*  RBC 4.29 4.55 4.34  HGB 13.3 14.2 13.5  HCT 39.8 42.4 40.2  MCV 92.8 93.2 92.6  MCH 31.0 31.2 31.1  MCHC 33.4 33.5 33.6  RDW 14.2 14.4 14.3  PLT 131* 149* 214    Cardiac Enzymes No results for input(s): TROPONINI in the last 168 hours. No results for input(s): TROPIPOC in the last 168 hours.   BNPNo results for input(s): BNP, PROBNP in the last 168 hours.   DDimer No results for input(s): DDIMER in the last 168 hours.   Radiology    Dg Chest 1 View  Result Date: 12/28/2017 CLINICAL DATA:  Initial evaluation for acute shortness of breath. EXAM: CHEST 1 VIEW COMPARISON:  Prior radiograph from 05/19/2016. FINDINGS: Left-sided pacemaker/ AICD in place. Median  sternotomy wires underlying CABG markers and surgical clips. Cardiomegaly, stable from previous. Mediastinal silhouette normal. Aortic atherosclerosis. Lungs are mildly hypoinflated. Elevation of the right hemidiaphragm with associated right basilar opacity, likely atelectasis. Small layering left pleural effusion. Associated left basilar opacity may reflect atelectasis or infiltrate. No other focal airspace disease. No pulmonary edema. No pneumothorax. Possible drain overlies the right upper quadrant. No acute osseous abnormality. Remotely healed right clavicular fracture noted. IMPRESSION: 1. Shallow lung inflation with small left pleural effusion. Bibasilar opacities favored to reflect atelectasis, although infiltrates could be considered in the correct clinical setting. 2. Stable cardiomegaly with sequelae of prior CABG. 3. Atherosclerosis. Electronically Signed   By: Jeannine Boga M.D.   On: 12/28/2017 22:38    Cardiac Studies     Patient Profile     83 y.o. male history of coronary artery disease, ischemic cardia myopathy with an ejection fraction of around 25-30%, status post AICD, atrial fibrillation admitted with abdominal pain and found to have gallstones.  Assessment & Plan    1.  Atrial fibrillation: Patient has had atrial for ablation in the past.  Maintaining NSR per SK No low dose eliquis he will monitor f/utter/fib burden from AICD and make decision with Dr Acie Fredrickson In future had some asymptomatic NSVT this am eating breakfast   2.  Coronary artery disease:  No chest pain stabel   3. CHF:  euvolemic I/O;s over a liter positive will resume home aldactone   For questions or updates, please contact [place] Please consult [email or web address] for contact info under Cardiology/STEMI.      Signed, Jenkins Rouge, MD  12/30/2017, 8:21 AM

## 2017-12-30 NOTE — Progress Notes (Signed)
4 Days Post-Op   Subjective/Chief Complaint: Eating OK, just walked with PT   Objective: Vital signs in last 24 hours: Temp:  [98.1 F (36.7 C)-98.9 F (37.2 C)] 98.3 F (36.8 C) (01/02 0802) Pulse Rate:  [64-76] 64 (01/02 0802) Resp:  [18] 18 (01/02 0802) BP: (111-131)/(58-71) 116/70 (01/02 0802) SpO2:  [93 %-97 %] 95 % (01/02 0802) Weight:  [77.1 kg (170 lb)] 77.1 kg (170 lb) (01/02 0500) Last BM Date: 12/29/17  Intake/Output from previous day: 01/01 0701 - 01/02 0700 In: 480 [P.O.:480] Out: 895 [Urine:800; Drains:95] Intake/Output this shift: No intake/output data recorded.  General appearance: cooperative Resp: clear to auscultation bilaterally GI: soft, JP SS  Lab Results:  Recent Labs    12/27/17 0934  WBC 13.7*  HGB 13.5  HCT 40.2  PLT 214   BMET Recent Labs    12/27/17 0934 12/28/17 1332  NA 138 134*  K 3.5 3.6  CL 108 104  CO2 23 23  GLUCOSE 176* 142*  BUN 17 18  CREATININE 1.08 1.07  CALCIUM 7.9* 8.0*   PT/INR No results for input(s): LABPROT, INR in the last 72 hours. ABG No results for input(s): PHART, HCO3 in the last 72 hours.  Invalid input(s): PCO2, PO2  Studies/Results: Dg Chest 1 View  Result Date: 12/28/2017 CLINICAL DATA:  Initial evaluation for acute shortness of breath. EXAM: CHEST 1 VIEW COMPARISON:  Prior radiograph from 05/19/2016. FINDINGS: Left-sided pacemaker/ AICD in place. Median sternotomy wires underlying CABG markers and surgical clips. Cardiomegaly, stable from previous. Mediastinal silhouette normal. Aortic atherosclerosis. Lungs are mildly hypoinflated. Elevation of the right hemidiaphragm with associated right basilar opacity, likely atelectasis. Small layering left pleural effusion. Associated left basilar opacity may reflect atelectasis or infiltrate. No other focal airspace disease. No pulmonary edema. No pneumothorax. Possible drain overlies the right upper quadrant. No acute osseous abnormality. Remotely healed  right clavicular fracture noted. IMPRESSION: 1. Shallow lung inflation with small left pleural effusion. Bibasilar opacities favored to reflect atelectasis, although infiltrates could be considered in the correct clinical setting. 2. Stable cardiomegaly with sequelae of prior CABG. 3. Atherosclerosis. Electronically Signed   By: Jeannine Boga M.D.   On: 12/28/2017 22:38    Anti-infectives: Anti-infectives (From admission, onward)   Start     Dose/Rate Route Frequency Ordered Stop   12/27/17 1300  amoxicillin-clavulanate (AUGMENTIN) 875-125 MG per tablet 1 tablet     1 tablet Oral Every 12 hours 12/27/17 1238     12/24/17 0900  vancomycin (VANCOCIN) IVPB 750 mg/150 ml premix  Status:  Discontinued     750 mg 150 mL/hr over 60 Minutes Intravenous Every 12 hours 12/24/17 0852 12/24/17 0854   12/22/17 1300  vancomycin (VANCOCIN) IVPB 1000 mg/200 mL premix  Status:  Discontinued     1,000 mg 200 mL/hr over 60 Minutes Intravenous Every 24 hours 12/22/17 1257 12/24/17 0852   12/21/17 2330  piperacillin-tazobactam (ZOSYN) IVPB 3.375 g  Status:  Discontinued     3.375 g 12.5 mL/hr over 240 Minutes Intravenous Every 8 hours 12/21/17 2302 12/27/17 1238      Assessment/Plan: S/P lap chole 12/29 by Dr. Dema Severin Plan 5d antibiotics for empyema of GB Mobilizing with therapies Continue JP  LOS: 9 days    Zenovia Jarred 12/30/2017

## 2017-12-30 NOTE — Care Management Note (Signed)
Case Management Note  Patient Details  Name: [name] MRN: [id number] Date of Birth: 1935-06-28  Subjective/Objective:                    Action/Plan: In to speak with patient, wife at bedside. Prior to admission patient lived at home with wife.  PCP Burnard Bunting, MD.  Uses [facility] in [place], [place].  Home DME: None.  Wife does the shopping and is able to take patient to medical appointments. Referral received for Oxygen, HH PT and DME 3 in 1.   In to speak with Pt, discussed referral for HH PT, Oxygen and DME (3 in 1).  Pt states does not feel like she needs DME (3 in 1).  Offered choice for [facility].  Patient/spouse selected [facility].  Referral called to [facility] with [facility].    Expected Discharge Date:  12/30/17               Expected Discharge Plan:  [place]  Discharge planning Services  CM Consult  Post Acute Care Choice:  Home Health Choice offered to:  Patient, Spouse  HH Arranged:  PT [place] Agency:  [facility] [facility]  Status of Service:  Completed, signed off  Montel Culver, BSN, RN  Case Manager-Orientation [phone] 12/30/2017, 1:14 PM

## 2017-12-30 NOTE — Discharge Summary (Signed)
Physician Discharge Summary  [name] UTM:[id number] DOB: [birth year] DOA: 12/21/2017  PCP: Burnard Bunting, MD  Admit date: 12/21/2017 Discharge date: 12/30/2017  Admitted From: Home Disposition:  Home  Recommendations for Outpatient Follow-up:  1. Follow up with PCP in 1 week with repeat CBC/BMP 2. Follow-up with general surgery in 1 week or as scheduled.  Wound care as per general surgery recommendations 3. Follow-up with cardiology in 1-2 weeks   Home Health: Yes Equipment/Devices: None  Discharge Condition: Stable CODE STATUS: Full  Diet recommendation: Heart Healthy   Brief/Interim Summary: 83 year old male with history of coronary artery disease status post CABG, ischemic cardiomyopathy, CHF status post AICD, atrial fibrillation not on anticoagulation secondary to subdural hematoma presented with abdominal pain.  He was admitted for sepsis secondary to cholecystitis.  General surgery was consulted.  Subsequently underwent laparoscopic cholecystectomy on 12/26/2017.  Hospitalization prolonged due to preop atrial fibrillation with  RVR and postop by ICD shock.  Cardiology was also consulted.  General surgery has cleared the patient for discharge with JP drain and outpatient follow-up with general surgery.  Discharge Diagnoses:  Principal Problem:   Empyema of gallbladder s/p lap cholecystectomy 12/26/2017 Active Problems:   Ischemic cardiomyopathy   Chronic systolic CHF (congestive heart failure) (HCC)   Atrial arrhythmia/a fibrillation   ICD (implantable cardioverter-defibrillator) in place   CAD (coronary artery disease)   Cholelithiasis   AKI (acute kidney injury) ([place])   Sepsis ([place])   Dementia   Paroxysmal atrial fibrillation ([place])  Sepsis -Secondary to acute cholecystitis -Resolved -Cultures negative so far  Acute calculus cholecystitis - HIDA c/w cholecystitis. - S/p cholecystectomy 12/26/17  -Initially on intravenous antibiotics, switched to oral  antibiotics.  Currently on Augmentin.  Complete Augmentin till 01/01/2018 as per general surgery. -General surgery has cleared the patient for discharge with JP drain.  Outpatient follow-up with general surgery  Afib, known, not on anticoagulation secondary to past SDH. -  s/p ICD shock. EP evaluation recommended monitoring off anticoagulation for now. Continue beta-blocker and amiodarone per EP.  Outpatient follow-up with EP/cardiology   Hypoxia -Questionable cause. -Patient still requiring 2 L oxygen as saturation dropped to the 80s on ambulation.  Patient will be discharged on oxygen via nasal cannula 2 L/min.  Patient might benefit from outpatient pulmonary evaluation.  AKI secondary to hypotension, sepsis - resolved  CAD, CABG, ICM chronic systolic CHF, ICD, VT on amiodarone. - remains stable  Hypothyroidism - continue levothyroxine   Dementia - will continue donepezil   Generalized deconditioning -Continue outpatient PT   Discharge Instructions  Discharge Instructions    Ambulatory referral to Cardiology   Complete by:  As directed    Follow-up in 1-2 weeks   Call MD for:  difficulty breathing, headache or visual disturbances   Complete by:  As directed    Call MD for:  extreme fatigue   Complete by:  As directed    Call MD for:  hives   Complete by:  As directed    Call MD for:  persistant dizziness or light-headedness   Complete by:  As directed    Call MD for:  persistant nausea and vomiting   Complete by:  As directed    Call MD for:  redness, tenderness, or signs of infection (pain, swelling, redness, odor or green/yellow discharge around incision site)   Complete by:  As directed    Call MD for:  severe uncontrolled pain   Complete by:  As directed    Call  MD for:  temperature >100.4   Complete by:  As directed    Diet - low sodium heart healthy   Complete by:  As directed    Discharge instructions   Complete by:  As directed    Wound care as per  general surgery recommendations   Increase activity slowly   Complete by:  As directed      Allergies as of 12/30/2017   No Known Allergies     Medication List    TAKE these medications   amiodarone 100 MG tablet Commonly known as:  PACERONE Take 0.5 tablets (50 mg total) by mouth daily.   amoxicillin-clavulanate 875-125 MG tablet Commonly known as:  AUGMENTIN Take 1 tablet by mouth every 12 (twelve) hours for 2 days.   aspirin EC 81 MG tablet Take 81 mg by mouth daily.   b complex vitamins tablet Take 1 tablet by mouth daily.   carvedilol 6.25 MG tablet Commonly known as:  COREG TAKE 1 TABLET BY MOUTH 2 TIMES DAILY WITH A MEAL. What changed:  See the new instructions.   COQ10 PO Take 1 capsule by mouth daily.   donepezil 5 MG tablet Commonly known as:  ARICEPT Take 5 mg by mouth daily.   feeding supplement Liqd Take 237 mLs by mouth 2 (two) times daily between meals for 7 days.   fenofibrate 160 MG tablet Take 1 tablet (160 mg total) by mouth daily.   finasteride 5 MG tablet Commonly known as:  PROSCAR Take 5 mg by mouth daily.   hydrocortisone 2.5 % rectal cream Commonly known as:  ANUSOL-HC Apply 1 application topically 4 (four) times daily as needed for hemorrhoids.   levothyroxine 75 MCG tablet Commonly known as:  SYNTHROID, LEVOTHROID Take 1 tablet (75 mcg total) by mouth daily before breakfast. What changed:  when to take this   lisinopril 5 MG tablet Commonly known as:  PRINIVIL,ZESTRIL Take 1 tablet (5 mg total) by mouth daily.   LYCOPENE PO Take 1 capsule by mouth daily.   methocarbamol 500 MG tablet Commonly known as:  ROBAXIN Take 2 tablets (1,000 mg total) by mouth every 6 (six) hours as needed for muscle spasms.   multivitamin with minerals Tabs tablet Take 1 tablet by mouth daily.   nitroGLYCERIN 0.4 MG SL tablet Commonly known as:  NITROSTAT Place 1 tablet (0.4 mg total) under the tongue every 5 (five) minutes as needed for chest  pain. For chest pain x 3 doses What changed:    reasons to take this  additional instructions   ondansetron 4 MG tablet Commonly known as:  ZOFRAN Take 1 tablet (4 mg total) by mouth every 6 (six) hours as needed for nausea.   OVER THE COUNTER MEDICATION Take 1 tablet by mouth daily. "prostrate essentials"   SLO-NIACIN 500 MG tablet Generic drug:  niacin Take 500 mg by mouth daily.   spironolactone 25 MG tablet Commonly known as:  ALDACTONE Take 1 tablet (25 mg total) by mouth daily.   tamsulosin 0.4 MG Caps capsule Commonly known as:  FLOMAX Take 0.4 mg by mouth at bedtime.   traMADol 50 MG tablet Commonly known as:  ULTRAM Take 1 tablet (50 mg total) by mouth every 6 (six) hours as needed for moderate pain.            Durable Medical Equipment  (From admission, onward)        Start     Ordered   12/30/17 1218  DME Oxygen  Once    Question Answer Comment  Mode or (Route) Nasal cannula   Liters per Minute 2   Frequency Continuous (stationary and portable oxygen unit needed)   Oxygen conserving device Yes   Oxygen delivery system Gas      12/30/17 1218     Follow-up Information    Ileana Roup, MD. Go on 01/06/2018.   Specialty:  General Surgery Why:  Your appointment is 01/06/2017 at 10:30AM. Please arrive 30 minutes prior to your appointment to check in and fill out paperwork. Bring photo ID and insurance information. Contact information: [place] [address] [phone]        Burnard Bunting, MD. Schedule an appointment as soon as possible for a visit in 1 week(s).   Specialty:  Internal Medicine Why:  with CBC/BMP Contact information: [place] [address] [phone]        Deboraha Sprang, MD. Schedule an appointment as soon as possible for a visit in 1 week(s).   Specialty:  Cardiology Contact information: [address] [place] [address] [phone]          No  Known Allergies  Consultations:  General surgery and cardiology and electrophysiology   Procedures/Studies: Dg Chest 1 View  Result Date: 12/28/2017 CLINICAL DATA:  Initial evaluation for acute shortness of breath. EXAM: CHEST 1 VIEW COMPARISON:  Prior radiograph from 05/19/2016. FINDINGS: Left-sided pacemaker/ AICD in place. Median sternotomy wires underlying CABG markers and surgical clips. Cardiomegaly, stable from previous. Mediastinal silhouette normal. Aortic atherosclerosis. Lungs are mildly hypoinflated. Elevation of the right hemidiaphragm with associated right basilar opacity, likely atelectasis. Small layering left pleural effusion. Associated left basilar opacity may reflect atelectasis or infiltrate. No other focal airspace disease. No pulmonary edema. No pneumothorax. Possible drain overlies the right upper quadrant. No acute osseous abnormality. Remotely healed right clavicular fracture noted. IMPRESSION: 1. Shallow lung inflation with small left pleural effusion. Bibasilar opacities favored to reflect atelectasis, although infiltrates could be considered in the correct clinical setting. 2. Stable cardiomegaly with sequelae of prior CABG. 3. Atherosclerosis. Electronically Signed   By: Jeannine Boga M.D.   On: 12/28/2017 22:38   Nm Hepatobiliary Liver Func  Addendum Date: 12/23/2017   ADDENDUM REPORT: 12/23/2017 16:06 ADDENDUM: These results were called by telephone at the time of interpretation on 12/23/2017 at 15:49 pm to Dr. Tana Coast , who verbally acknowledged these results. Electronically Signed   By: Fidela Salisbury M.D.   On: 12/23/2017 16:06   Result Date: 12/23/2017 CLINICAL DATA:  Right-sided abdominal pain.  History of gallstones. EXAM: NUCLEAR MEDICINE HEPATOBILIARY IMAGING TECHNIQUE: Sequential images of the abdomen were obtained out to 60 minutes following intravenous administration of radiopharmaceutical. RADIOPHARMACEUTICALS:  5.1 mCi Tc-75m Choletec IV. 3.0 mg  morphine were given 90 at 90 minutes. COMPARISON:  Right upper quadrant ultrasound 12/21/2017 FINDINGS: Prompt uptake and biliary excretion of activity by the liver is seen. Large photopenic defect was seen overlying the right lobe of the liver. Gallbladder activity is not visualized, consistent with obstruction of the cystic duct. Biliary activity passes into small bowel, consistent with patent common bile duct. No significant change was seen after administration of morphine. IMPRESSION: Nonvisualization of the gallbladder, consistent with obstruction at the cystic duct level. Findings usually associated with acute cholecystitis. Patent common bile duct. Large photopenic defect is seen overlying the right lobe of the liver. This may represent an enlarged photopenic gallbladder obscuring the right lobe of the liver,  interposition of the transverse colon obscuring the right lobe of the liver, or potentially an intrinsic liver mass. Electronically Signed: By: Fidela Salisbury M.D. On: 12/23/2017 15:28   Ct Abdomen Pelvis W Contrast  Result Date: 12/21/2017 CLINICAL DATA:  Abdominal distension since last night. Hernia repair. EXAM: CT ABDOMEN AND PELVIS WITH CONTRAST TECHNIQUE: Multidetector CT imaging of the abdomen and pelvis was performed using the standard protocol following bolus administration of intravenous contrast. CONTRAST:  158mL ISOVUE-300 IOPAMIDOL (ISOVUE-300) INJECTION 61% COMPARISON:  None. FINDINGS: Lower chest: There is atelectasis in the lung bases with no suspicious infiltrates, nodules, or masses. Cardiomegaly is identified. Pacemaker leads are seen. Hepatobiliary: The left lobe of the liver is relatively atrophic. No hepatic masses are seen. The portal vein is patent. The gallbladder is distended. There is mild increased attenuation in the fat along the undersurface of the gallbladder best seen on coronal images. Cholelithiasis is noted. There is suggestion of stones in the cystic duct on  coronal imaging. No common bile duct stones are seen on this study. However, there is mild prominence of the intrahepatic bile ducts on today's study. Pancreas: Unremarkable. No pancreatic ductal dilatation or surrounding inflammatory changes. Spleen: Normal in size without focal abnormality. Adrenals/Urinary Tract: Adrenal glands are normal. There is a cyst in the right kidney. No hydronephrosis or suspicious masses. No ureterectasis or ureteral stones. The bladder is normal. Stomach/Bowel: The stomach is normal. There is a duodenal diverticulum without evidence of inflammation. The small bowel is otherwise normal. Diffuse colonic diverticulosis is seen without focal diverticulitis. The appendix is well seen with no evidence of appendicitis. Vascular/Lymphatic: Atherosclerotic change is seen in the nonaneurysmal aorta, iliac vessels, and femoral vessels. No adenopathy. Reproductive: The prostate is enlarged. Other: No free air or free fluid. Mild increased attenuation in the fat of the abdomen may be due to volume overload. Musculoskeletal: No acute or significant osseous findings. IMPRESSION: 1. The gallbladder is distended and contains numerous stones. There is suggestion of stones in the cystic duct. There is mild fat stranding along the inferior surface of the gallbladder on coronal images. The findings are concerning for acute cholecystitis. Mild intrahepatic ductal prominence. Recommend clinical correlation and ultrasound. 2. Atherosclerotic change in the nonaneurysmal aorta. 3. Diverticulosis without focal diverticulitis. Electronically Signed   By: Dorise Bullion III M.D   On: 12/21/2017 09:59   US Abdomen Limited Ruq  Result Date: 12/21/2017 CLINICAL DATA:  Biliary colic. EXAM: ULTRASOUND ABDOMEN LIMITED RIGHT UPPER QUADRANT COMPARISON:  CT scan of same day. FINDINGS: Gallbladder: Cholelithiasis is noted with largest calculus measuring 2 cm. No significant gallbladder wall thickening or  pericholecystic fluid is noted. No sonographic Murphy's sign is noted. Common bile duct: Diameter: 5.5 mm which is within normal limits. Liver: No focal lesion identified. Within normal limits in parenchymal echogenicity. Portal vein is patent on color Doppler imaging with normal direction of blood flow towards the liver. IMPRESSION: Cholelithiasis is noted without definite evidence of cholecystitis. If there is clinical concern for cholecystitis, HIDA scan is recommended for further evaluation. Electronically Signed   By: Marijo Conception, M.D.   On: 12/21/2017 12:18     Laparoscopic cholecystectomy on 12/26/2017  Subjective: Patient seen and examined at bedside.  He denies any overnight fever, nausea or vomiting.  Discharge Exam: Vitals:   12/30/17 0802 12/30/17 1215  BP: 116/70 (!) 114/58  Pulse: 64 64  Resp: 18 18  Temp: 98.3 F (36.8 C) 98.2 F (36.8 C)  SpO2: 95% 97%  Vitals:   12/30/17 0500 12/30/17 0753 12/30/17 0802 12/30/17 1215  BP: 113/61 131/60 116/70 (!) 114/58  Pulse: 68 70 64 64  Resp: 18  18 18   Temp: 98.6 F (37 C)  98.3 F (36.8 C) 98.2 F (36.8 C)  TempSrc: Oral  Oral Oral  SpO2: 95% 97% 95% 97%  Weight: 77.1 kg (170 lb)     Height:        General: Pt is alert, awake, not in acute distress Cardiovascular: Rate controlled, S1/S2 + Respiratory: Bilateral decreased breath sounds at bases Abdominal: Soft, NT, ND, bowel sounds + Extremities: no edema, no cyanosis    The results of significant diagnostics from this hospitalization (including imaging, microbiology, ancillary and laboratory) are listed below for reference.     Microbiology: Recent Results (from the past 240 hour(s))  Culture, blood (routine x 2)     Status: None   Collection Time: 12/22/17  9:30 AM  Result Value Ref Range Status   Specimen Description BLOOD RIGHT ANTECUBITAL  Final   Special Requests   Final    BOTTLES DRAWN AEROBIC AND ANAEROBIC Blood Culture adequate volume    Culture NO GROWTH 5 DAYS  Final   Report Status 12/27/2017 FINAL  Final  Culture, blood (routine x 2)     Status: None   Collection Time: 12/22/17  9:45 AM  Result Value Ref Range Status   Specimen Description BLOOD RIGHT HAND  Final   Special Requests   Final    BOTTLES DRAWN AEROBIC AND ANAEROBIC Blood Culture adequate volume   Culture NO GROWTH 5 DAYS  Final   Report Status 12/27/2017 FINAL  Final  MRSA PCR Screening     Status: None   Collection Time: 12/22/17  1:47 PM  Result Value Ref Range Status   MRSA by PCR NEGATIVE NEGATIVE Final    Comment:        The GeneXpert MRSA Assay (FDA approved for NASAL specimens only), is one component of a comprehensive MRSA colonization surveillance program. It is not intended to diagnose MRSA infection nor to guide or monitor treatment for MRSA infections.   Surgical pcr screen     Status: None   Collection Time: 12/26/17  9:07 AM  Result Value Ref Range Status   MRSA, PCR NEGATIVE NEGATIVE Final   Staphylococcus aureus NEGATIVE NEGATIVE Final    Comment: (NOTE) The Xpert SA Assay (FDA approved for NASAL specimens in patients 44 years of age and older), is one component of a comprehensive surveillance program. It is not intended to diagnose infection nor to guide or monitor treatment.      Labs: BNP (last 3 results) No results for input(s): BNP in the last 8760 hours. Basic Metabolic Panel: Recent Labs  Lab 12/24/17 0613 12/25/17 0538 12/26/17 0830 12/27/17 0934 12/28/17 1332  NA 137 138 139 138 134*  K 3.6 3.6 3.2* 3.5 3.6  CL 107 108 109 108 104  CO2 20* 20* 22 23 23   GLUCOSE 104* 118* 111* 176* 142*  BUN 22* 19 16 17 18   CREATININE 1.34* 1.27* 1.07 1.08 1.07  CALCIUM 8.2* 8.4* 8.0* 7.9* 8.0*  MG  --   --   --   --  1.8   Liver Function Tests: Recent Labs  Lab 12/24/17 0613 12/25/17 0538  AST 25 28  ALT 21 23  ALKPHOS 24* 29*  BILITOT 1.5* 2.0*  PROT 5.5* 5.7*  ALBUMIN 2.5* 2.5*   Recent Labs  Lab  12/25/17  3716  LIPASE 54*   No results for input(s): AMMONIA in the last 168 hours. CBC: Recent Labs  Lab 12/24/17 0613 12/25/17 0538 12/27/17 0934  WBC 17.5* 18.5* 13.7*  HGB 13.3 14.2 13.5  HCT 39.8 42.4 40.2  MCV 92.8 93.2 92.6  PLT 131* 149* 214   Cardiac Enzymes: No results for input(s): CKTOTAL, CKMB, CKMBINDEX, TROPONINI in the last 168 hours. BNP: Invalid input(s): POCBNP CBG: Recent Labs  Lab 12/29/17 0112 12/29/17 0854 12/29/17 1607 12/30/17 0003 12/30/17 0725  GLUCAP 115* 107* 97 103* 101*   D-Dimer No results for input(s): DDIMER in the last 72 hours. Hgb A1c No results for input(s): HGBA1C in the last 72 hours. Lipid Profile No results for input(s): CHOL, HDL, LDLCALC, TRIG, CHOLHDL, LDLDIRECT in the last 72 hours. Thyroid function studies No results for input(s): TSH, T4TOTAL, T3FREE, THYROIDAB in the last 72 hours.  Invalid input(s): FREET3 Anemia work up No results for input(s): VITAMINB12, FOLATE, FERRITIN, TIBC, IRON, RETICCTPCT in the last 72 hours. Urinalysis    Component Value Date/Time   COLORURINE YELLOW 12/21/2017 1038   APPEARANCEUR CLEAR 12/21/2017 1038   LABSPEC 1.010 12/21/2017 1038   PHURINE 6.5 12/21/2017 1038   GLUCOSEU NEGATIVE 12/21/2017 1038   HGBUR NEGATIVE 12/21/2017 1038   BILIRUBINUR NEGATIVE 12/21/2017 1038   KETONESUR NEGATIVE 12/21/2017 1038   PROTEINUR NEGATIVE 12/21/2017 1038   NITRITE NEGATIVE 12/21/2017 1038   LEUKOCYTESUR NEGATIVE 12/21/2017 1038   Sepsis Labs Invalid input(s): PROCALCITONIN,  WBC,  LACTICIDVEN Microbiology Recent Results (from the past 240 hour(s))  Culture, blood (routine x 2)     Status: None   Collection Time: 12/22/17  9:30 AM  Result Value Ref Range Status   Specimen Description BLOOD RIGHT ANTECUBITAL  Final   Special Requests   Final    BOTTLES DRAWN AEROBIC AND ANAEROBIC Blood Culture adequate volume   Culture NO GROWTH 5 DAYS  Final   Report Status 12/27/2017 FINAL  Final   Culture, blood (routine x 2)     Status: None   Collection Time: 12/22/17  9:45 AM  Result Value Ref Range Status   Specimen Description BLOOD RIGHT HAND  Final   Special Requests   Final    BOTTLES DRAWN AEROBIC AND ANAEROBIC Blood Culture adequate volume   Culture NO GROWTH 5 DAYS  Final   Report Status 12/27/2017 FINAL  Final  MRSA PCR Screening     Status: None   Collection Time: 12/22/17  1:47 PM  Result Value Ref Range Status   MRSA by PCR NEGATIVE NEGATIVE Final    Comment:        The GeneXpert MRSA Assay (FDA approved for NASAL specimens only), is one component of a comprehensive MRSA colonization surveillance program. It is not intended to diagnose MRSA infection nor to guide or monitor treatment for MRSA infections.   Surgical pcr screen     Status: None   Collection Time: 12/26/17  9:07 AM  Result Value Ref Range Status   MRSA, PCR NEGATIVE NEGATIVE Final   Staphylococcus aureus NEGATIVE NEGATIVE Final    Comment: (NOTE) The Xpert SA Assay (FDA approved for NASAL specimens in patients 54 years of age and older), is one component of a comprehensive surveillance program. It is not intended to diagnose infection nor to guide or monitor treatment.      Time coordinating discharge: 35 minutes  SIGNED:   Aline August, MD  [facility] 12/30/2017, 12:19 PM Pager: [phone]  If 7PM-7AM,  please contact night-coverage [email or web address] Password [email or web address]

## 2017-12-30 NOTE — Discharge Instructions (Signed)

## 2017-12-30 NOTE — Progress Notes (Signed)
Occupational Therapy Treatment Patient Details Name: [name] MRN: [id number] DOB: 10-Sep-1935 Today's Date: 12/30/2017    History of present illness Pt is an 83 y.o. male who presented to the ED with abdominal pain and was admitted with sepsis secondary to cholecystitis now s/p cholecystectomy 12/29. PMH significant for atrial fibrillation, automatic implantable cardiac defibrillator in situ, automatic implantable cardioverter-defibrillator in situ, CHF, subdural hematoma, hypertension, hyperlipidemia, hypothyroidism, ischemic cardiomyopath s/p CABG 2012, ventricular fibrillation, ventricular tachycardia.   OT comments  Pt demonstrating progress toward OT goals. Noted change in his affect this session with family not present. Pt was very flat throughout and verbalized minimally as compared to being talkative and animated during evaluation. Pt additionally demonstrating continued decreased short-term memory this session. Pt was able to complete standing grooming tasks and ambulating toilet transfers with min guard assistance utilizing RW. Pt reports increased stability and comfort when using RW. Will continue to follow while admitted.    Follow Up Recommendations  Supervision/Assistance - 24 hour;No OT follow up    Equipment Recommendations  3 in 1 bedside commode    Recommendations for Other Services      Precautions / Restrictions Precautions Precautions: Fall Precaution Comments: Pt with R shoulder pathology and history of broken R foot Restrictions Weight Bearing Restrictions: No Other Position/Activity Restrictions: JP drain       Mobility Bed Mobility Overal bed mobility: Needs Assistance Bed Mobility: Supine to Sit     Supine to sit: Min guard;HOB elevated     General bed mobility comments: Increased time and effort and use of bed rail. Not well coordinated.  Transfers Overall transfer level: Needs assistance Equipment used: None Transfers: Sit to/from  Stand Sit to Stand: Min guard         General transfer comment: Min guard assist for safety and steadiness.     Balance Overall balance assessment: Needs assistance Sitting-balance support: No upper extremity supported;Feet supported Sitting balance-Leahy Scale: Good     Standing balance support: No upper extremity supported;Bilateral upper extremity supported;During functional activity Standing balance-Leahy Scale: Fair Standing balance comment: Min guard assist when ambulating with RW this session.                            ADL either performed or assessed with clinical judgement   ADL Overall ADL's : Needs assistance/impaired     Grooming: Min guard;Standing                   Toilet Transfer: Min Marine scientist Details (indicate cue type and reason): Min guard assist using RW this session. Moving quickly with multiple moments of instability         Functional mobility during ADLs: Min guard;Rolling walker General ADL Comments: Utilized RW this session with much improvement in stability. Pt reports feeling more comfortable.      Vision   Vision Assessment?: No apparent visual deficits   Perception     Praxis      Cognition Arousal/Alertness: Awake/alert Behavior During Therapy: Flat affect Overall Cognitive Status: Impaired/Different from baseline Area of Impairment: Memory                     Memory: Decreased short-term memory         General Comments: Pt very flat and not verbalizing much. Much different affect with family not present this session. Poor memory of daily events.  Exercises     Shoulder Instructions       General Comments Pt quiet and appearing "down" this session    Pertinent Vitals/ Pain       Pain Assessment: Faces Faces Pain Scale: Hurts a little bit Pain Location: abdomen at incision; "dull ache" Pain Descriptors / Indicators: Discomfort Pain Intervention(s):  Limited activity within patient's tolerance;Monitored during session  Home Living                                          Prior Functioning/Environment              Frequency  Min 2X/week        Progress Toward Goals  OT Goals(current goals can now be found in the care plan section)  Progress towards OT goals: Progressing toward goals  Acute Rehab OT Goals Patient Stated Goal: to go home OT Goal Formulation: With patient/family Time For Goal Achievement: 01/12/18 Potential to Achieve Goals: Good  Plan Discharge plan remains appropriate    Co-evaluation                 AM-PAC PT "6 Clicks" Daily Activity     Outcome Measure   Help from another person eating meals?: A Little Help from another person taking care of personal grooming?: A Little Help from another person toileting, which includes using toliet, bedpan, or urinal?: A Little Help from another person bathing (including washing, rinsing, drying)?: A Little Help from another person to put on and taking off regular upper body clothing?: A Little Help from another person to put on and taking off regular lower body clothing?: A Little 6 Click Score: 18    End of Session Equipment Utilized During Treatment: Gait belt;Oxygen  OT Visit Diagnosis: Other abnormalities of gait and mobility (R26.89);Muscle weakness (generalized) (M62.81)   Activity Tolerance Patient tolerated treatment well   Patient Left in bed;with call bell/phone within reach;with family/visitor present   Nurse Communication Mobility status        Time: 8280-0349 OT Time Calculation (min): 22 min  Charges: OT General Charges $OT Visit: 1 Visit OT Treatments $Self Care/Home Management : 8-22 mins  Norman Herrlich, MS OTR/L  Pager: [place] 12/30/2017, 10:23 AM

## 2017-12-30 NOTE — Progress Notes (Signed)
Reviewed discharge instructions/medications with patient. Answered their questions. Pt's oxygen/equipment was delivered to his room. Answered his questions. Pt is stable and ready for discharge. Showed pt how to empty JP drain.

## 2017-12-31 ENCOUNTER — Telehealth: Payer: Self-pay | Admitting: Internal Medicine

## 2017-12-31 DIAGNOSIS — F0390 Unspecified dementia without behavioral disturbance: Secondary | ICD-10-CM | POA: Diagnosis not present

## 2017-12-31 DIAGNOSIS — E039 Hypothyroidism, unspecified: Secondary | ICD-10-CM | POA: Diagnosis not present

## 2017-12-31 DIAGNOSIS — Z7982 Long term (current) use of aspirin: Secondary | ICD-10-CM | POA: Diagnosis not present

## 2017-12-31 DIAGNOSIS — I4891 Unspecified atrial fibrillation: Secondary | ICD-10-CM | POA: Diagnosis not present

## 2017-12-31 DIAGNOSIS — Z95810 Presence of automatic (implantable) cardiac defibrillator: Secondary | ICD-10-CM | POA: Diagnosis not present

## 2017-12-31 DIAGNOSIS — I5022 Chronic systolic (congestive) heart failure: Secondary | ICD-10-CM | POA: Diagnosis not present

## 2017-12-31 DIAGNOSIS — Z951 Presence of aortocoronary bypass graft: Secondary | ICD-10-CM | POA: Diagnosis not present

## 2017-12-31 DIAGNOSIS — I472 Ventricular tachycardia: Secondary | ICD-10-CM | POA: Diagnosis not present

## 2017-12-31 DIAGNOSIS — Z79891 Long term (current) use of opiate analgesic: Secondary | ICD-10-CM | POA: Diagnosis not present

## 2017-12-31 DIAGNOSIS — Z9181 History of falling: Secondary | ICD-10-CM | POA: Diagnosis not present

## 2017-12-31 DIAGNOSIS — I2510 Atherosclerotic heart disease of native coronary artery without angina pectoris: Secondary | ICD-10-CM | POA: Diagnosis not present

## 2017-12-31 DIAGNOSIS — I4892 Unspecified atrial flutter: Secondary | ICD-10-CM | POA: Diagnosis not present

## 2017-12-31 DIAGNOSIS — E785 Hyperlipidemia, unspecified: Secondary | ICD-10-CM | POA: Diagnosis not present

## 2017-12-31 DIAGNOSIS — Z48815 Encounter for surgical aftercare following surgery on the digestive system: Secondary | ICD-10-CM | POA: Diagnosis not present

## 2017-12-31 DIAGNOSIS — I110 Hypertensive heart disease with heart failure: Secondary | ICD-10-CM | POA: Diagnosis not present

## 2017-12-31 NOTE — Telephone Encounter (Signed)
New Message     Patient wife is calling to schedule hospital follow up appt for patient , patient needs to be seen within week with Caryl Comes?

## 2018-01-04 DIAGNOSIS — I5022 Chronic systolic (congestive) heart failure: Secondary | ICD-10-CM | POA: Diagnosis not present

## 2018-01-04 DIAGNOSIS — I2510 Atherosclerotic heart disease of native coronary artery without angina pectoris: Secondary | ICD-10-CM | POA: Diagnosis not present

## 2018-01-04 DIAGNOSIS — I110 Hypertensive heart disease with heart failure: Secondary | ICD-10-CM | POA: Diagnosis not present

## 2018-01-04 DIAGNOSIS — F0390 Unspecified dementia without behavioral disturbance: Secondary | ICD-10-CM | POA: Diagnosis not present

## 2018-01-04 DIAGNOSIS — Z48815 Encounter for surgical aftercare following surgery on the digestive system: Secondary | ICD-10-CM | POA: Diagnosis not present

## 2018-01-04 DIAGNOSIS — I4891 Unspecified atrial fibrillation: Secondary | ICD-10-CM | POA: Diagnosis not present

## 2018-01-05 DIAGNOSIS — N179 Acute kidney failure, unspecified: Secondary | ICD-10-CM | POA: Diagnosis not present

## 2018-01-06 NOTE — Progress Notes (Deleted)
Electrophysiology Office Note Date: 01/06/2018  ID:  Alejandro, Mora September 17, 1935, MRN [id number]  PCP: Burnard Bunting, MD Primary Cardiologist: N[place]ser Electrophysiologist: Caryl Comes  CC: post hospital follow-up  [name] is a 83 y.o. male seen today for Dr Caryl Comes.  He presents today for routine electrophysiology followup. He was admitted last month for abdominal pain and found to have cholelithiasis.  He underwent lap chole and post op had AF/flutter with RVR.  He received an inappropriate ICD shock during that admission for AF with RVR which converted him to SR.  He has previously had SDH and has not been anticoagulated. Per Dr Olin Pia last note, will monitor AF burden over time.   Since discharge, the patient reports doing very well. He denies chest pain, palpitations, dyspnea, PND, orthopnea, nausea, vomiting, dizziness, syncope, edema, weight gain, or early satiety.  He has not had ICD shocks.   Device History: MDT ICD implanted *** for *** History of appropriate therapy: yes and inappropriate therapy for atrial arrhythmias History of AAD therapy: yes - amiodarone, intolerant of higher doses   Past Medical History:  Diagnosis Date  . Atrial arrhythmia/a fibrillation    not on coumadin 2/2 subdural hematoma  . Automatic implantable cardiac defibrillator in situ    medtronic  . Automatic implantable cardioverter-defibrillator in situ   . CHF (congestive heart failure) ([place])   . Chronic systolic congestive heart failure ([place])   . Dysrhythmia   . Encounter for long-term (current) use of other medications    amiodarone  . H/O hiatal hernia   . Hyperlipidemia   . Hypertension   . Hypothyroidism   . Ischemic cardiomyopathy    status post CABG with patent vein grafts and an atretic LIMA to his ramus, January 2012  . Myocardial infarction [facility])    1995, non-STEMI January 2012  . Pacemaker   . Subdural hematoma (HCC)    on coumadin  . Thyroid disease    hypothyroid    . Ventricular fibrillation ([place])   . Ventricular tachycardia ([place])     VT Storm-January 2012- responded to Amiodarone   Past Surgical History:  Procedure Laterality Date  . bilateral cranitomies for sub hematomas    . BYPASS GRAFT  1995   5  . CHOLECYSTECTOMY N/A 12/26/2017   Procedure: LAPAROSCOPIC CHOLECYSTECTOMY;  Surgeon: Ileana Roup, MD;  Location: [place];  Service: General;  Laterality: N/A;  . CORONARY ARTERY BYPASS GRAFT  1995   5  . IMPLANTABLE CARDIOVERTER DEFIBRILLATOR GENERATOR CHANGE N/A 03/28/2013   Procedure: IMPLANTABLE CARDIOVERTER DEFIBRILLATOR GENERATOR CHANGE;  Surgeon: Deboraha Sprang, MD;  Location: [facility] CATH LAB;  Service: Cardiovascular;  Laterality: N/A;  . INGUINAL HERNIA REPAIR Left 05/15/2014   Procedure: HERNIA REPAIR INGUINAL ADULT;  Surgeon: Zenovia Jarred, MD;  Location: [place];  Service: General;  Laterality: Left;  . INSERT / REPLACE / REMOVE PACEMAKER  14    generator repl   . INSERTION OF MESH Left 05/15/2014   Procedure: INSERTION OF MESH;  Surgeon: Zenovia Jarred, MD;  Location: [place];  Service: General;  Laterality: Left;  . TONSILLECTOMY      Current Outpatient Medications  Medication Sig Dispense Refill  . amiodarone (PACERONE) 100 MG tablet Take 0.5 tablets (50 mg total) by mouth daily. 45 tablet 2  . aspirin EC 81 MG tablet Take 81 mg by mouth daily.    [name] Kitchen b complex vitamins tablet Take 1 tablet by mouth daily.    [name] Kitchen  carvedilol (COREG) 6.25 MG tablet TAKE 1 TABLET BY MOUTH 2 TIMES DAILY WITH A MEAL. (Patient taking differently: TAKE 1 TABLET (6.25 MG) BY MOUTH 2 TIMES DAILY WITH A MEAL.) 180 tablet 2  . Coenzyme Q10 (COQ10 PO) Take 1 capsule by mouth daily.    [name] Kitchen donepezil (ARICEPT) 5 MG tablet Take 5 mg by mouth daily.     . feeding supplement (ENSURE SURGERY) LIQD Take 237 mLs by mouth 2 (two) times daily between meals for 7 days. 3318 mL 0  . fenofibrate 160 MG tablet Take 1 tablet (160 mg total) by mouth daily. 90 tablet 3  .  finasteride (PROSCAR) 5 MG tablet Take 5 mg by mouth daily.  5  . hydrocortisone (ANUSOL-HC) 2.5 % rectal cream Apply 1 application topically 4 (four) times daily as needed for hemorrhoids. 30 g 0  . levothyroxine (SYNTHROID, LEVOTHROID) 75 MCG tablet Take 1 tablet (75 mcg total) by mouth daily before breakfast.  3  . lisinopril (PRINIVIL,ZESTRIL) 5 MG tablet Take 1 tablet (5 mg total) by mouth daily. 90 tablet 3  . LYCOPENE PO Take 1 capsule by mouth daily.    . methocarbamol (ROBAXIN) 500 MG tablet Take 2 tablets (1,000 mg total) by mouth every 6 (six) hours as needed for muscle spasms. 20 tablet 0  . Multiple Vitamin (MULTIVITAMIN WITH MINERALS) TABS tablet Take 1 tablet by mouth daily.    . niacin (SLO-NIACIN) 500 MG tablet Take 500 mg by mouth daily.     . nitroGLYCERIN (NITROSTAT) 0.4 MG SL tablet Place 1 tablet (0.4 mg total) under the tongue every 5 (five) minutes as needed for chest pain. For chest pain x 3 doses (Patient taking differently: Place 0.4 mg under the tongue every 5 (five) minutes as needed for chest pain (max 3 doses). ) 25 tablet 3  . ondansetron (ZOFRAN) 4 MG tablet Take 1 tablet (4 mg total) by mouth every 6 (six) hours as needed for nausea. 20 tablet 0  . OVER THE COUNTER MEDICATION Take 1 tablet by mouth daily. "prostrate essentials"    . spironolactone (ALDACTONE) 25 MG tablet Take 1 tablet (25 mg total) by mouth daily. 90 tablet 2  . Tamsulosin HCl (FLOMAX) 0.4 MG CAPS Take 0.4 mg by mouth at bedtime.     . traMADol (ULTRAM) 50 MG tablet Take 1 tablet (50 mg total) by mouth every 6 (six) hours as needed for moderate pain. 14 tablet 0   No current facility-administered medications for this visit.     Allergies:   Patient has no known allergies.   Social History: Social History   Socioeconomic History  . Marital status: Married    Spouse name: Not on file  . Number of children: Not on file  . Years of education: Not on file  . Highest education level: Not on  file  Social Needs  . Financial resource strain: Not on file  . Food insecurity - worry: Not on file  . Food insecurity - inability: Not on file  . Transportation needs - medical: Not on file  . Transportation needs - non-medical: Not on file  Occupational History  . Not on file  Tobacco Use  . Smoking status: Never Smoker  . Smokeless tobacco: Former Network engineer and Sexual Activity  . Alcohol use: No  . Drug use: No  . Sexual activity: Not on file  Other Topics Concern  . Not on file  Social History Narrative  . Not on file  Family History: Family History  Problem Relation Age of Onset  . Cerebral aneurysm Father   . Hypertension Father     Review of Systems: All other systems reviewed and are otherwise negative except as noted above.   Physical Exam: VS:  There were no vitals taken for this visit. , BMI There is no height or weight on file to calculate BMI.  GEN- The patient is well appearing, alert and oriented x 3 today.   HEENT: normocephalic, atraumatic; sclera clear, conjunctiva pink; hearing intact; oropharynx clear; neck supple, no JVP Lymph- no cervical lymphadenopathy Lungs- Clear to ausculation bilaterally, normal work of breathing.  No wheezes, rales, rhonchi Heart- Regular rate and rhythm, no murmurs, rubs or gallops, PMI not laterally displaced GI- soft, non-tender, non-distended, bowel sounds present, no hepatosplenomegaly Extremities- no clubbing, cyanosis, or edema; DP/PT/radial pulses 2+ bilaterally MS- no significant deformity or atrophy Skin- warm and dry, no rash or lesion; ICD pocket well healed Psych- euthymic mood, full affect Neuro- strength and sensation are intact  ICD interrogation- reviewed in detail today,  See PACEART report  EKG:  EKG is not ordered today.  Recent Labs: 12/22/2017: TSH 2.663 12/25/2017: ALT 23 12/27/2017: Hemoglobin 13.5; Platelets 214 12/28/2017: BUN 18; Creatinine, Ser 1.07; Magnesium 1.8; Potassium 3.6;  Sodium 134   Wt Readings from Last 3 Encounters:  12/30/17 170 lb (77.1 kg)  08/14/17 163 lb 3.2 oz (74 kg)  04/14/17 165 lb (74.8 kg)     Other studies Reviewed: Additional studies/ records that were reviewed today include: hospital records   Assessment and Plan:  1.  Chronic systolic dysfunction euvolemic today Stable on an appropriate medical regimen Normal ICD function See Pace Art report No changes today  2.  Paroxysmal atrial fibrillation/flutter Burden by device interrogation ***% Previous SDH Per Dr Caryl Comes, will monitor burden and reconsider [place] if burden increases  3.  VT No recent recurrence Continue amiodarone  4.  CAD s/p CABG No recent ischemic symptoms   Current medicines are reviewed at length with the patient today.   The patient does not have concerns regarding his medicines.  The following changes were made today:  none  Labs/ tests ordered today include: none No orders of the defined types were placed in this encounter.    Disposition:   Follow up with Dr Caryl Comes 3 months     Signed, Chanetta Marshall, NP 01/06/2018 6:10 AM  [facility] [address] [place] [address] [phone] (office) [phone] (fax)

## 2018-01-08 ENCOUNTER — Ambulatory Visit (INDEPENDENT_AMBULATORY_CARE_PROVIDER_SITE_OTHER): Payer: Medicare Other | Admitting: Nurse Practitioner

## 2018-01-08 ENCOUNTER — Encounter: Payer: Medicare Other | Admitting: Nurse Practitioner

## 2018-01-08 VITALS — BP 108/62 | HR 72 | Ht 71.0 in | Wt 152.0 lb

## 2018-01-08 DIAGNOSIS — I2510 Atherosclerotic heart disease of native coronary artery without angina pectoris: Secondary | ICD-10-CM

## 2018-01-08 DIAGNOSIS — I480 Paroxysmal atrial fibrillation: Secondary | ICD-10-CM

## 2018-01-08 DIAGNOSIS — I472 Ventricular tachycardia, unspecified: Secondary | ICD-10-CM

## 2018-01-08 DIAGNOSIS — I5022 Chronic systolic (congestive) heart failure: Secondary | ICD-10-CM | POA: Diagnosis not present

## 2018-01-08 NOTE — Progress Notes (Signed)
Electrophysiology Office Note Date: 01/08/2018  ID:  [name], DOB 08-31-1935, MRN [id number]  PCP: Burnard Bunting, MD Primary Cardiologist: Nahser Electrophysiologist: Caryl Comes  CC: post hospital follow-up  [name] is a 83 y.o. male seen today for Dr Caryl Comes.  He presents today for routine electrophysiology followup. He was admitted last month for abdominal pain and found to have cholelithiasis.  He underwent lap chole and post op had AF/flutter with RVR.  He received an inappropriate ICD shock during that admission for AF with RVR which converted him to SR.  He has previously had SDH and has not been anticoagulated. Per Dr Olin Pia last note, will monitor AF burden over time.  He does not remember seeing Dr Caryl Comes in the hospital.  Since discharge, the patient reports doing relatively well.  He asks today for an order to stop home health services.  He is walking independently without limitations.  He denies chest pain, palpitations, dyspnea, PND, orthopnea, nausea, vomiting, dizziness, syncope, edema, weight gain, or early satiety.  He has not had ICD shocks.   Device History: History of appropriate therapy: yes and inappropriate therapy for atrial arrhythmias History of AAD therapy: yes - amiodarone, intolerant of higher doses   Past Medical History:  Diagnosis Date  . Atrial arrhythmia/a fibrillation    not on coumadin 2/2 subdural hematoma  . Automatic implantable cardiac defibrillator in situ    medtronic  . Automatic implantable cardioverter-defibrillator in situ   . CHF (congestive heart failure) ([place])   . Chronic systolic congestive heart failure ([place])   . Dysrhythmia   . Encounter for long-term (current) use of other medications    amiodarone  . H/O hiatal hernia   . Hyperlipidemia   . Hypertension   . Hypothyroidism   . Ischemic cardiomyopathy    status post CABG with patent vein grafts and an atretic LIMA to his ramus, January 2012  . Myocardial  infarction [facility])    1995, non-STEMI January 2012  . Pacemaker   . Subdural hematoma (HCC)    on coumadin  . Thyroid disease    hypothyroid  . Ventricular fibrillation ([place])   . Ventricular tachycardia ([place])     VT Storm-January 2012- responded to Amiodarone   Past Surgical History:  Procedure Laterality Date  . bilateral cranitomies for sub hematomas    . BYPASS GRAFT  1995   5  . CHOLECYSTECTOMY N/A 12/26/2017   Procedure: LAPAROSCOPIC CHOLECYSTECTOMY;  Surgeon: Ileana Roup, MD;  Location: [place];  Service: General;  Laterality: N/A;  . CORONARY ARTERY BYPASS GRAFT  1995   5  . IMPLANTABLE CARDIOVERTER DEFIBRILLATOR GENERATOR CHANGE N/A 03/28/2013   Procedure: IMPLANTABLE CARDIOVERTER DEFIBRILLATOR GENERATOR CHANGE;  Surgeon: Deboraha Sprang, MD;  Location[facility] CATH LAB;  Service: Cardiovascular;  Laterality: N/A;  . INGUINAL HERNIA REPAIR Left 05/15/2014   Procedure: HERNIA REPAIR INGUINAL ADULT;  Surgeon: Zenovia Jarred, MD;  Location: [place];  Service: General;  Laterality: Left;  . INSERT / REPLACE / REMOVE PACEMAKER  14    generator repl   . INSERTION OF MESH Left 05/15/2014   Procedure: INSERTION OF MESH;  Surgeon: Zenovia Jarred, MD;  Location: [place];  Service: General;  Laterality: Left;  . TONSILLECTOMY      Current Outpatient Medications  Medication Sig Dispense Refill  . amiodarone (PACERONE) 100 MG tablet Take 0.5 tablets (50 mg total) by mouth daily. 45 tablet 2  . aspirin EC 81 MG tablet  Take 81 mg by mouth daily.    [name] Kitchen b complex vitamins tablet Take 1 tablet by mouth daily.    . carvedilol (COREG) 6.25 MG tablet TAKE 1 TABLET BY MOUTH 2 TIMES DAILY WITH A MEAL. (Patient taking differently: TAKE 1 TABLET (6.25 MG) BY MOUTH 2 TIMES DAILY WITH A MEAL.) 180 tablet 2  . Coenzyme Q10 (COQ10 PO) Take 1 capsule by mouth daily.    [name] Kitchen donepezil (ARICEPT) 5 MG tablet Take 5 mg by mouth daily.     . fenofibrate 160 MG tablet Take 1 tablet (160 mg total) by mouth daily.  90 tablet 3  . finasteride (PROSCAR) 5 MG tablet Take 5 mg by mouth daily.  5  . hydrocortisone (ANUSOL-HC) 2.5 % rectal cream Apply 1 application topically 4 (four) times daily as needed for hemorrhoids. 30 g 0  . levothyroxine (SYNTHROID, LEVOTHROID) 75 MCG tablet Take 1 tablet (75 mcg total) by mouth daily before breakfast.  3  . lisinopril (PRINIVIL,ZESTRIL) 5 MG tablet Take 1 tablet (5 mg total) by mouth daily. 90 tablet 3  . LYCOPENE PO Take 1 capsule by mouth daily.    . methocarbamol (ROBAXIN) 500 MG tablet Take 2 tablets (1,000 mg total) by mouth every 6 (six) hours as needed for muscle spasms. 20 tablet 0  . Multiple Vitamin (MULTIVITAMIN WITH MINERALS) TABS tablet Take 1 tablet by mouth daily.    . nitroGLYCERIN (NITROSTAT) 0.4 MG SL tablet Place 1 tablet (0.4 mg total) under the tongue every 5 (five) minutes as needed for chest pain. For chest pain x 3 doses (Patient taking differently: Place 0.4 mg under the tongue every 5 (five) minutes as needed for chest pain (max 3 doses). ) 25 tablet 3  . ondansetron (ZOFRAN) 4 MG tablet Take 1 tablet (4 mg total) by mouth every 6 (six) hours as needed for nausea. 20 tablet 0  . OVER THE COUNTER MEDICATION Take 1 tablet by mouth daily. "prostrate essentials"    . spironolactone (ALDACTONE) 25 MG tablet Take 1 tablet (25 mg total) by mouth daily. 90 tablet 2  . Tamsulosin HCl (FLOMAX) 0.4 MG CAPS Take 0.4 mg by mouth at bedtime.     . traMADol (ULTRAM) 50 MG tablet Take 1 tablet (50 mg total) by mouth every 6 (six) hours as needed for moderate pain. 14 tablet 0   No current facility-administered medications for this visit.     Allergies:   Patient has no known allergies.   Social History: Social History   Socioeconomic History  . Marital status: Married    Spouse name: Not on file  . Number of children: Not on file  . Years of education: Not on file  . Highest education level: Not on file  Social Needs  . Financial resource strain: Not  on file  . Food insecurity - worry: Not on file  . Food insecurity - inability: Not on file  . Transportation needs - medical: Not on file  . Transportation needs - non-medical: Not on file  Occupational History  . Not on file  Tobacco Use  . Smoking status: Never Smoker  . Smokeless tobacco: Former Network engineer and Sexual Activity  . Alcohol use: No  . Drug use: No  . Sexual activity: Not on file  Other Topics Concern  . Not on file  Social History Narrative  . Not on file    Family History: Family History  Problem Relation Age of Onset  . Cerebral  aneurysm Father   . Hypertension Father     Review of Systems: All other systems reviewed and are otherwise negative except as noted above.   Physical Exam: VS:  BP 108/62   Pulse 72   Ht 5\' 11"  (1.803 m)   Wt 152 lb (68.9 kg)   BMI 21.20 kg/m  , BMI Body mass index is 21.2 kg/m.  GEN- The patient is well appearing, alert and oriented x 3 today.   HEENT: normocephalic, atraumatic; sclera clear, conjunctiva pink; hearing intact; oropharynx clear; neck supple Lungs- Clear to ausculation bilaterally, normal work of breathing.  No wheezes, rales, rhonchi Heart- Regular rate and rhythm GI- soft, non-tender, non-distended, bowel sounds present Extremities- no clubbing, cyanosis, or edema MS- no significant deformity or atrophy Skin- warm and dry, no rash or lesion; ICD pocket well healed Psych- euthymic mood, full affect Neuro- strength and sensation are intact  ICD interrogation- reviewed in detail today,  See PACEART report  EKG:  EKG is not ordered today.  Recent Labs: 12/22/2017: TSH 2.663 12/25/2017: ALT 23 12/27/2017: Hemoglobin 13.5; Platelets 214 12/28/2017: BUN 18; Creatinine, Ser 1.07; Magnesium 1.8; Potassium 3.6; Sodium 134   Wt Readings from Last 3 Encounters:  01/08/18 152 lb (68.9 kg)  12/30/17 170 lb (77.1 kg)  08/14/17 163 lb 3.2 oz (74 kg)     Other studies Reviewed: Additional studies/  records that were reviewed today include: hospital records   Assessment and Plan:  1.  Chronic systolic dysfunction euvolemic today Stable on an appropriate medical regimen Normal ICD function See Pace Art report No changes today  2.  Paroxysmal atrial fibrillation/flutter Burden by device interrogation low Previous SDH Per Dr Caryl Comes, will monitor burden and reconsider [place] if burden increases  3.  VT No recent recurrence Continue amiodarone  4.  CAD s/p CABG No recent ischemic symptoms   Current medicines are reviewed at length with the patient today.   The patient does not have concerns regarding his medicines.  The following changes were made today:  none  Labs/ tests ordered today include: none No orders of the defined types were placed in this encounter.    Disposition:   Follow up with Dr Caryl Comes 3 months     Signed, Chanetta Marshall, NP 01/08/2018 2:49 PM  [place] [address] [place] [address] [phone] (office) [phone] (fax)

## 2018-01-08 NOTE — Patient Instructions (Addendum)
Medication Instructions:   Your physician recommends that you continue on your current medications as directed. Please refer to the Current Medication list given to you today.   If you need a refill on your cardiac medications before your next appointment, please call your pharmacy.  Labwork:  NONE ORDERED  TODAY    Testing/Procedures:  NONE ORDERED  TODAY    Follow-Up:  IN 3 MONTHS WITH DR Caryl Comes    Any Other Special Instructions Will Be Listed Below (If Applicable).

## 2018-01-11 DIAGNOSIS — Z48815 Encounter for surgical aftercare following surgery on the digestive system: Secondary | ICD-10-CM | POA: Diagnosis not present

## 2018-01-11 DIAGNOSIS — I110 Hypertensive heart disease with heart failure: Secondary | ICD-10-CM | POA: Diagnosis not present

## 2018-01-11 DIAGNOSIS — I2510 Atherosclerotic heart disease of native coronary artery without angina pectoris: Secondary | ICD-10-CM | POA: Diagnosis not present

## 2018-01-11 DIAGNOSIS — I4891 Unspecified atrial fibrillation: Secondary | ICD-10-CM | POA: Diagnosis not present

## 2018-01-11 DIAGNOSIS — F0390 Unspecified dementia without behavioral disturbance: Secondary | ICD-10-CM | POA: Diagnosis not present

## 2018-01-11 DIAGNOSIS — I5022 Chronic systolic (congestive) heart failure: Secondary | ICD-10-CM | POA: Diagnosis not present

## 2018-01-19 ENCOUNTER — Telehealth: Payer: Self-pay | Admitting: Cardiovascular Disease

## 2018-01-19 NOTE — Telephone Encounter (Signed)
Patient's wife calling to get an appointment with Dr. Acie Fredrickson. Patient's wife complaining of patient's health and mental well being. Patient's wife stated Dr. Acie Fredrickson saw her in the hallway at the hospital and told her she could make an appointment with him when patient gets home from the hospital.  Dr. Acie Fredrickson had an opening tomorrow at 9:00. Made patient an appointment at that time. Informed patient's wife that message would be sent to Dr. Acie Fredrickson, and if this appointment time was not appropriate that I would call her back to reschedule. Patient's wife verbalized understanding and thanked me for my time and help.

## 2018-01-19 NOTE — Telephone Encounter (Signed)
New message  Pt wife verbalized that she is calling for Dr.Nasher  She said that pt is not getting back to him self and tat she seen Dr.Nasher in the hospital   And he told her that he can be seen when he is discharged form the hospital

## 2018-01-20 ENCOUNTER — Ambulatory Visit
Admission: RE | Admit: 2018-01-20 | Discharge: 2018-01-20 | Disposition: A | Discharge status: Home / Self Care | Payer: Medicare Other | Source: Ambulatory Visit | Attending: Cardiovascular Disease | Admitting: Cardiovascular Disease

## 2018-01-20 ENCOUNTER — Ambulatory Visit (INDEPENDENT_AMBULATORY_CARE_PROVIDER_SITE_OTHER): Payer: Medicare Other | Admitting: Cardiovascular Disease

## 2018-01-20 ENCOUNTER — Encounter (INDEPENDENT_AMBULATORY_CARE_PROVIDER_SITE_OTHER): Payer: Self-pay

## 2018-01-20 ENCOUNTER — Encounter: Payer: Self-pay | Admitting: Cardiovascular Disease

## 2018-01-20 VITALS — BP 98/60 | HR 72 | Wt 146.8 lb

## 2018-01-20 DIAGNOSIS — R0989 Other specified symptoms and signs involving the circulatory and respiratory systems: Secondary | ICD-10-CM | POA: Diagnosis not present

## 2018-01-20 DIAGNOSIS — R059 Cough, unspecified: Secondary | ICD-10-CM

## 2018-01-20 DIAGNOSIS — R05 Cough: Secondary | ICD-10-CM

## 2018-01-20 DIAGNOSIS — I2510 Atherosclerotic heart disease of native coronary artery without angina pectoris: Secondary | ICD-10-CM | POA: Diagnosis not present

## 2018-01-20 DIAGNOSIS — I5022 Chronic systolic (congestive) heart failure: Secondary | ICD-10-CM

## 2018-01-20 LAB — CUP PACEART INCLINIC DEVICE CHECK
Implantable Lead Implant Date: 19990520
Implantable Lead Implant Date: 20011228
Implantable Lead Location: 753860
Implantable Lead Model: 6947
Implantable Pulse Generator Implant Date: 20140331
MDC IDC LEAD LOCATION: 753859
MDC IDC SESS DTM: 20190123085343
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
## 2018-01-20 NOTE — Patient Instructions (Addendum)
Medication Instructions:  Your physician recommends that you continue on your current medications as directed. Please refer to the Current Medication list given to you today.   Labwork: None Ordered   Testing/Procedures: A chest x-ray takes a picture of the organs and structures inside the chest, including the heart, lungs, and blood vessels. This test can show several things, including, whether the heart is enlarges; whether fluid is building up in the lungs; and whether pacemaker / defibrillator leads are still in place. Go to [address] [facility] building, 1st floor to [place] for your xray You do not need an appointment  Follow-Up: Your physician recommends that you schedule a follow-up appointment in: 3 months with Dr. Acie Fredrickson   If you need a refill on your cardiac medications before your next appointment, please call your pharmacy.   Thank you for choosing [facility]! Christen Bame, RN [phone]

## 2018-01-20 NOTE — Telephone Encounter (Signed)
Pt was seen in the office today.  

## 2018-01-22 DIAGNOSIS — I4891 Unspecified atrial fibrillation: Secondary | ICD-10-CM | POA: Diagnosis not present

## 2018-01-22 DIAGNOSIS — E7849 Other hyperlipidemia: Secondary | ICD-10-CM | POA: Diagnosis not present

## 2018-01-22 DIAGNOSIS — I10 Essential (primary) hypertension: Secondary | ICD-10-CM | POA: Diagnosis not present

## 2018-01-22 DIAGNOSIS — R5381 Other malaise: Secondary | ICD-10-CM | POA: Diagnosis not present

## 2018-01-22 DIAGNOSIS — Z6822 Body mass index (BMI) 22.0-22.9, adult: Secondary | ICD-10-CM | POA: Diagnosis not present

## 2018-01-22 DIAGNOSIS — R413 Other amnesia: Secondary | ICD-10-CM | POA: Diagnosis not present

## 2018-01-22 DIAGNOSIS — Z950 Presence of cardiac pacemaker: Secondary | ICD-10-CM | POA: Diagnosis not present

## 2018-01-22 DIAGNOSIS — E038 Other specified hypothyroidism: Secondary | ICD-10-CM | POA: Diagnosis not present

## 2018-01-22 DIAGNOSIS — Z13228 Encounter for screening for other metabolic disorders: Secondary | ICD-10-CM | POA: Diagnosis not present

## 2018-01-22 DIAGNOSIS — R634 Abnormal weight loss: Secondary | ICD-10-CM | POA: Diagnosis not present

## 2018-01-28 DIAGNOSIS — R5381 Other malaise: Secondary | ICD-10-CM | POA: Diagnosis not present

## 2018-01-28 DIAGNOSIS — E038 Other specified hypothyroidism: Secondary | ICD-10-CM | POA: Diagnosis not present

## 2018-01-28 DIAGNOSIS — I4891 Unspecified atrial fibrillation: Secondary | ICD-10-CM | POA: Diagnosis not present

## 2018-01-28 DIAGNOSIS — I2510 Atherosclerotic heart disease of native coronary artery without angina pectoris: Secondary | ICD-10-CM | POA: Diagnosis not present

## 2018-01-28 DIAGNOSIS — A419 Sepsis, unspecified organism: Secondary | ICD-10-CM | POA: Diagnosis not present

## 2018-01-28 DIAGNOSIS — R0902 Hypoxemia: Secondary | ICD-10-CM | POA: Diagnosis not present

## 2018-01-28 DIAGNOSIS — K8000 Calculus of gallbladder with acute cholecystitis without obstruction: Secondary | ICD-10-CM | POA: Diagnosis not present

## 2018-01-28 DIAGNOSIS — R413 Other amnesia: Secondary | ICD-10-CM | POA: Diagnosis not present

## 2018-01-28 DIAGNOSIS — N179 Acute kidney failure, unspecified: Secondary | ICD-10-CM | POA: Diagnosis not present

## 2018-02-04 DIAGNOSIS — Z950 Presence of cardiac pacemaker: Secondary | ICD-10-CM | POA: Diagnosis not present

## 2018-02-04 DIAGNOSIS — Z6822 Body mass index (BMI) 22.0-22.9, adult: Secondary | ICD-10-CM | POA: Diagnosis not present

## 2018-02-04 DIAGNOSIS — N401 Enlarged prostate with lower urinary tract symptoms: Secondary | ICD-10-CM | POA: Diagnosis not present

## 2018-02-04 DIAGNOSIS — I4891 Unspecified atrial fibrillation: Secondary | ICD-10-CM | POA: Diagnosis not present

## 2018-02-04 DIAGNOSIS — E7849 Other hyperlipidemia: Secondary | ICD-10-CM | POA: Diagnosis not present

## 2018-02-04 DIAGNOSIS — M1990 Unspecified osteoarthritis, unspecified site: Secondary | ICD-10-CM | POA: Diagnosis not present

## 2018-02-04 DIAGNOSIS — R413 Other amnesia: Secondary | ICD-10-CM | POA: Diagnosis not present

## 2018-02-04 DIAGNOSIS — I499 Cardiac arrhythmia, unspecified: Secondary | ICD-10-CM | POA: Diagnosis not present

## 2018-02-04 DIAGNOSIS — I2510 Atherosclerotic heart disease of native coronary artery without angina pectoris: Secondary | ICD-10-CM | POA: Diagnosis not present

## 2018-02-04 DIAGNOSIS — I10 Essential (primary) hypertension: Secondary | ICD-10-CM | POA: Diagnosis not present

## 2018-02-04 DIAGNOSIS — E038 Other specified hypothyroidism: Secondary | ICD-10-CM | POA: Diagnosis not present

## 2018-02-04 DIAGNOSIS — R5381 Other malaise: Secondary | ICD-10-CM | POA: Diagnosis not present

## 2018-02-08 ENCOUNTER — Other Ambulatory Visit: Payer: Medicare Other | Admitting: *Deleted

## 2018-02-08 DIAGNOSIS — I472 Ventricular tachycardia, unspecified: Secondary | ICD-10-CM

## 2018-02-08 DIAGNOSIS — I2510 Atherosclerotic heart disease of native coronary artery without angina pectoris: Secondary | ICD-10-CM | POA: Diagnosis not present

## 2018-02-08 DIAGNOSIS — I5022 Chronic systolic (congestive) heart failure: Secondary | ICD-10-CM | POA: Diagnosis not present

## 2018-02-08 DIAGNOSIS — E782 Mixed hyperlipidemia: Secondary | ICD-10-CM

## 2018-02-08 LAB — LIPID PANEL
CHOLESTEROL TOTAL: 114 mg/dL (ref 100–199)
Chol/HDL Ratio: 3 ratio (ref 0.0–5.0)
HDL: 38 mg/dL — ABNORMAL LOW (ref >39)
LDL CALC: 66 mg/dL (ref 0–99)
TRIGLYCERIDES: 49 mg/dL (ref 0–149)
VLDL CHOLESTEROL CAL: 10 mg/dL (ref 5–40)

## 2018-02-08 LAB — HEPATIC FUNCTION PANEL
ALT: 18 IU/L (ref 0–44)
AST: 23 IU/L (ref 0–40)
Albumin: 3.4 g/dL — ABNORMAL LOW (ref 3.5–4.7)
Alkaline Phosphatase: 28 IU/L — ABNORMAL LOW (ref 39–117)
Bilirubin Total: 0.5 mg/dL (ref 0.0–1.2)
Bilirubin, Direct: 0.24 mg/dL (ref 0.00–0.40)
Total Protein: 6 g/dL (ref 6.0–8.5)

## 2018-02-08 LAB — BASIC METABOLIC PANEL
BUN/Creatinine Ratio: 14 (ref 10–24)
BUN: 16 mg/dL (ref 8–27)
CO2: 23 mmol/L (ref 20–29)
Calcium: 8.8 mg/dL (ref 8.6–10.2)
Chloride: 100 mmol/L (ref 96–106)
Creatinine, Ser: 1.11 mg/dL (ref 0.76–1.27)
GFR, EST AFRICAN AMERICAN: 72 mL/min/{1.73_m2} (ref >59)
GFR, EST NON AFRICAN AMERICAN: 62 mL/min/{1.73_m2} (ref >59)
Glucose: 94 mg/dL (ref 65–99)
POTASSIUM: 4.7 mmol/L (ref 3.5–5.2)
SODIUM: 135 mmol/L (ref 134–144)

## 2018-02-10 ENCOUNTER — Other Ambulatory Visit: Payer: Self-pay

## 2018-02-10 MED ORDER — LISINOPRIL 5 MG PO TABS: 5.0000 mg | ORAL_TABLET | Freq: Every day | ORAL | 3 refills | Status: DC

## 2018-03-22 ENCOUNTER — Inpatient Hospital Stay (HOSPITAL_COMMUNITY): Payer: Medicare Other

## 2018-03-22 ENCOUNTER — Encounter (HOSPITAL_COMMUNITY): Payer: Self-pay | Admitting: *Deleted

## 2018-03-22 ENCOUNTER — Emergency Department (HOSPITAL_COMMUNITY): Payer: Medicare Other

## 2018-03-22 ENCOUNTER — Inpatient Hospital Stay (HOSPITAL_COMMUNITY)
Admission: EM | Admit: 2018-03-22 | Discharge: 2018-03-31 | DRG: 177 | Disposition: A | Discharge status: Home Health | Payer: Medicare Other | Attending: Internal Medicine | Admitting: Internal Medicine

## 2018-03-22 DIAGNOSIS — J869 Pyothorax without fistula: Secondary | ICD-10-CM | POA: Diagnosis not present

## 2018-03-22 DIAGNOSIS — N400 Enlarged prostate without lower urinary tract symptoms: Secondary | ICD-10-CM | POA: Diagnosis present

## 2018-03-22 DIAGNOSIS — E440 Moderate protein-calorie malnutrition: Secondary | ICD-10-CM | POA: Diagnosis present

## 2018-03-22 DIAGNOSIS — B9689 Other specified bacterial agents as the cause of diseases classified elsewhere: Secondary | ICD-10-CM | POA: Diagnosis present

## 2018-03-22 DIAGNOSIS — Z9049 Acquired absence of other specified parts of digestive tract: Secondary | ICD-10-CM

## 2018-03-22 DIAGNOSIS — I472 Ventricular tachycardia, unspecified: Secondary | ICD-10-CM

## 2018-03-22 DIAGNOSIS — N179 Acute kidney failure, unspecified: Secondary | ICD-10-CM | POA: Diagnosis not present

## 2018-03-22 DIAGNOSIS — I2510 Atherosclerotic heart disease of native coronary artery without angina pectoris: Secondary | ICD-10-CM | POA: Diagnosis present

## 2018-03-22 DIAGNOSIS — Z9089 Acquired absence of other organs: Secondary | ICD-10-CM

## 2018-03-22 DIAGNOSIS — Z6820 Body mass index (BMI) 20.0-20.9, adult: Secondary | ICD-10-CM

## 2018-03-22 DIAGNOSIS — I10 Essential (primary) hypertension: Secondary | ICD-10-CM | POA: Diagnosis present

## 2018-03-22 DIAGNOSIS — E785 Hyperlipidemia, unspecified: Secondary | ICD-10-CM | POA: Diagnosis present

## 2018-03-22 DIAGNOSIS — I5032 Chronic diastolic (congestive) heart failure: Secondary | ICD-10-CM | POA: Diagnosis not present

## 2018-03-22 DIAGNOSIS — I493 Ventricular premature depolarization: Secondary | ICD-10-CM | POA: Diagnosis not present

## 2018-03-22 DIAGNOSIS — I4510 Unspecified right bundle-branch block: Secondary | ICD-10-CM | POA: Diagnosis present

## 2018-03-22 DIAGNOSIS — I5042 Chronic combined systolic (congestive) and diastolic (congestive) heart failure: Secondary | ICD-10-CM | POA: Diagnosis not present

## 2018-03-22 DIAGNOSIS — I130 Hypertensive heart and chronic kidney disease with heart failure and stage 1 through stage 4 chronic kidney disease, or unspecified chronic kidney disease: Secondary | ICD-10-CM | POA: Diagnosis present

## 2018-03-22 DIAGNOSIS — I5022 Chronic systolic (congestive) heart failure: Secondary | ICD-10-CM | POA: Diagnosis present

## 2018-03-22 DIAGNOSIS — Z87891 Personal history of nicotine dependence: Secondary | ICD-10-CM

## 2018-03-22 DIAGNOSIS — I255 Ischemic cardiomyopathy: Secondary | ICD-10-CM | POA: Diagnosis present

## 2018-03-22 DIAGNOSIS — I252 Old myocardial infarction: Secondary | ICD-10-CM

## 2018-03-22 DIAGNOSIS — I480 Paroxysmal atrial fibrillation: Secondary | ICD-10-CM | POA: Diagnosis not present

## 2018-03-22 DIAGNOSIS — J90 Pleural effusion, not elsewhere classified: Secondary | ICD-10-CM | POA: Diagnosis not present

## 2018-03-22 DIAGNOSIS — I4901 Ventricular fibrillation: Secondary | ICD-10-CM | POA: Diagnosis present

## 2018-03-22 DIAGNOSIS — R0789 Other chest pain: Secondary | ICD-10-CM | POA: Diagnosis not present

## 2018-03-22 DIAGNOSIS — Z7982 Long term (current) use of aspirin: Secondary | ICD-10-CM

## 2018-03-22 DIAGNOSIS — Z8249 Family history of ischemic heart disease and other diseases of the circulatory system: Secondary | ICD-10-CM

## 2018-03-22 DIAGNOSIS — R079 Chest pain, unspecified: Secondary | ICD-10-CM

## 2018-03-22 DIAGNOSIS — R7989 Other specified abnormal findings of blood chemistry: Secondary | ICD-10-CM | POA: Diagnosis not present

## 2018-03-22 DIAGNOSIS — E860 Dehydration: Secondary | ICD-10-CM | POA: Diagnosis present

## 2018-03-22 DIAGNOSIS — F0390 Unspecified dementia without behavioral disturbance: Secondary | ICD-10-CM | POA: Diagnosis present

## 2018-03-22 DIAGNOSIS — E039 Hypothyroidism, unspecified: Secondary | ICD-10-CM | POA: Diagnosis present

## 2018-03-22 DIAGNOSIS — Z951 Presence of aortocoronary bypass graft: Secondary | ICD-10-CM

## 2018-03-22 DIAGNOSIS — Z9689 Presence of other specified functional implants: Secondary | ICD-10-CM | POA: Diagnosis not present

## 2018-03-22 DIAGNOSIS — E43 Unspecified severe protein-calorie malnutrition: Secondary | ICD-10-CM | POA: Diagnosis not present

## 2018-03-22 DIAGNOSIS — N182 Chronic kidney disease, stage 2 (mild): Secondary | ICD-10-CM | POA: Diagnosis present

## 2018-03-22 DIAGNOSIS — Z8719 Personal history of other diseases of the digestive system: Secondary | ICD-10-CM

## 2018-03-22 DIAGNOSIS — Z79899 Other long term (current) drug therapy: Secondary | ICD-10-CM

## 2018-03-22 DIAGNOSIS — Z79890 Hormone replacement therapy (postmenopausal): Secondary | ICD-10-CM

## 2018-03-22 DIAGNOSIS — Z8679 Personal history of other diseases of the circulatory system: Secondary | ICD-10-CM

## 2018-03-22 DIAGNOSIS — Z4682 Encounter for fitting and adjustment of non-vascular catheter: Secondary | ICD-10-CM | POA: Diagnosis not present

## 2018-03-22 DIAGNOSIS — R072 Precordial pain: Secondary | ICD-10-CM | POA: Diagnosis not present

## 2018-03-22 DIAGNOSIS — Z95810 Presence of automatic (implantable) cardiac defibrillator: Secondary | ICD-10-CM

## 2018-03-22 DIAGNOSIS — R5381 Other malaise: Secondary | ICD-10-CM | POA: Diagnosis present

## 2018-03-22 LAB — BASIC METABOLIC PANEL
Anion gap: 9 (ref 5–15)
BUN: 22 mg/dL — ABNORMAL HIGH (ref 6–20)
CO2: 22 mmol/L (ref 22–32)
CREATININE: 1.45 mg/dL — AB (ref 0.61–1.24)
Calcium: 8.6 mg/dL — ABNORMAL LOW (ref 8.9–10.3)
Chloride: 107 mmol/L (ref 101–111)
GFR calc Af Amer: 51 mL/min — ABNORMAL LOW (ref >60)
GFR calc non Af Amer: 44 mL/min — ABNORMAL LOW (ref >60)
GLUCOSE: 113 mg/dL — AB (ref 65–99)
Potassium: 4.2 mmol/L (ref 3.5–5.1)
Sodium: 138 mmol/L (ref 135–145)

## 2018-03-22 LAB — I-STAT TROPONIN, ED
Troponin i, poc: 0 ng/mL (ref 0.00–0.08)
Troponin i, poc: 0 ng/mL (ref 0.00–0.08)

## 2018-03-22 LAB — CBC
HEMATOCRIT: 37 % — AB (ref 39.0–52.0)
Hemoglobin: 12.1 g/dL — ABNORMAL LOW (ref 13.0–17.0)
MCH: 30.9 pg (ref 26.0–34.0)
MCHC: 32.7 g/dL (ref 30.0–36.0)
MCV: 94.4 fL (ref 78.0–100.0)
Platelets: 244 10*3/uL (ref 150–400)
RBC: 3.92 MIL/uL — ABNORMAL LOW (ref 4.22–5.81)
RDW: 15.6 % — AB (ref 11.5–15.5)
WBC: 10.3 10*3/uL (ref 4.0–10.5)

## 2018-03-22 LAB — PROTIME-INR
INR: 1.39
Prothrombin Time: 17 seconds — ABNORMAL HIGH (ref 11.4–15.2)

## 2018-03-22 LAB — TSH: TSH: 2.265 u[IU]/mL (ref 0.350–4.500)

## 2018-03-22 LAB — D-DIMER, QUANTITATIVE (NOT AT ARMC): D DIMER QUANT: 1.72 ug{FEU}/mL — AB (ref 0.00–0.50)

## 2018-03-22 LAB — TROPONIN I: Troponin I: <0.03 ng/mL (ref <0.03)

## 2018-03-22 LAB — HEMOGLOBIN A1C
Hgb A1c MFr Bld: 4.9 % (ref 4.8–5.6)
Mean Plasma Glucose: 93.93 mg/dL

## 2018-03-22 LAB — T4, FREE: Free T4: 1.67 ng/dL — ABNORMAL HIGH (ref 0.61–1.12)

## 2018-03-22 LAB — CBG MONITORING, ED: Glucose-Capillary: 94 mg/dL (ref 65–99)

## 2018-03-22 LAB — MAGNESIUM: MAGNESIUM: 1.9 mg/dL (ref 1.7–2.4)

## 2018-03-22 MED ORDER — HEPARIN (PORCINE) IN NACL 100-0.45 UNIT/ML-% IJ SOLN
1350.0000 [IU]/h | INTRAMUSCULAR | Status: DC
  Administered 2018-03-23 (×2): 1100 [IU]/h via INTRAVENOUS
  Filled 2018-03-22 (×2): qty 250

## 2018-03-22 MED ORDER — ASPIRIN 81 MG PO CHEW: 324.0000 mg | CHEWABLE_TABLET | Freq: Once | ORAL | Status: DC

## 2018-03-22 NOTE — ED Notes (Signed)
No acute findings per medtronic interrogation. EDP aware

## 2018-03-22 NOTE — ED Notes (Signed)
  Daughter n law  Laurette Schimke [phone]
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
## 2018-03-22 NOTE — Progress Notes (Signed)
ANTICOAGULATION CONSULT NOTE - Initial Consult  Pharmacy Consult:  Heparin Indication:  Rule out VTE  No Known Allergies  Patient Measurements: Height: 5\' 11"  (180.3 cm) Weight: 155 lb (70.3 kg) IBW/kg (Calculated) : 75.3 Heparin Dosing Weight: 70 kg  Vital Signs: Temp: 98.3 F (36.8 C) (03/25 1040) Temp Source: Oral (03/25 1040) BP: 117/63 (03/25 1945) Pulse Rate: 76 (03/25 1945)  Labs: Recent Labs    03/22/18 1051 03/22/18 1829  HGB 12.1*  --   HCT 37.0*  --   PLT 244  --   LABPROT  --  17.0*  INR  --  1.39  CREATININE 1.45*  --   TROPONINI  --  <0.03    Estimated Creatinine Clearance: 40.4 mL/min (A) (by C-G formula based on SCr of 1.45 mg/dL (H)).   Medical History: Past Medical History:  Diagnosis Date  . Atrial arrhythmia/a fibrillation    not on coumadin 2/2 subdural hematoma  . Automatic implantable cardiac defibrillator in situ    medtronic  . Automatic implantable cardioverter-defibrillator in situ   . CHF (congestive heart failure) ([place])   . Chronic systolic congestive heart failure ([employer])   . Dysrhythmia   . Encounter for long-term (current) use of other medications    amiodarone  . H/O hiatal hernia   . Hyperlipidemia   . Hypertension   . Hypothyroidism   . Ischemic cardiomyopathy    status post CABG with patent vein grafts and an atretic LIMA to his ramus, January 2012  . Myocardial infarction [facility])    1995, non-STEMI January 2012  . Pacemaker   . Subdural hematoma ([facility])    on coumadin  . Thyroid disease    hypothyroid  . Ventricular fibrillation ([place])   . Ventricular tachycardia ([place])     VT Storm-January 2012- responded to Amiodarone      Assessment: 82 YOM presented with chest pain.  Pharmacy consulted to dose IV heparin for rule out VTE in setting of elevated d-dimer.  CT negative for PE, but it was without contrast and provider wants to start heparin through tonight and MD will re-evaluate in AM.  Patient has a history of SDH  about 10 years ago and is not on anticoagulation for history of Afib.  Baseline labs reviewed.  No bleeding documented.   Goal of Therapy:  Heparin level 0.3-0.7 units/ml Monitor platelets by anticoagulation protocol: Yes    Plan:  Heparin gtt at 1100 units/hr, no bolus per NP Check 8 hr heparin level Daily heparin level and CBC F/U with [facility] plan in AM   Darrin Koman D. Mina Marble, PharmD, BCPS Pager:  [phone] 03/22/2018, 8:45 PM

## 2018-03-22 NOTE — ED Triage Notes (Signed)
To ED via [facility] for eval of cp. EMS gave 324mg  ASA and transported. No pain on arrival. Alert and oriented

## 2018-03-22 NOTE — Progress Notes (Signed)
ddimer is 1.72, will place on IV heparin overnight and Have Dr. Acie Fredrickson eval in AM- just had chest CT without contrast

## 2018-03-22 NOTE — H&P (Addendum)
Cardiology Admission History and Physical:   Patient ID: DMARION PERFECT; MRN: [id number]; DOB: December 27, 1935   Admission date: 03/22/2018  Primary Care Provider: Burnard Bunting, MD Primary Cardiologist: Mertie Moores, MD  Primary Electrophysiologist:  Dr. Caryl Comes   Chief Complaint:  Chest Pain   Patient Profile:   [name] is a 83 y.o. male with a history of CAD s/p cath 2012, MI and CABG (2947), chronic systolic HF, ischemic cardiomyopathy, ventricular tachycardia storm (currently controlled on Amiodarone), history of ICD, atrial fibrillation (not on anticoagulation d/t hx of subdural hematoma while on coumadin), HTN, HLD, and hypothyroidism who presented to the [facility] ED via EMS today with complaints of chest pain.   History of Present Illness:   [name] presented to [facility] ED via EMS after experiencing an episode of chest pain early this morning at 8 AM.  He reports that the chest pain occured while he was sitting down and denies any stressful recent events in his life. He describes the chest pain/tightness as central, non-radiating, non-pleuritic, and less than 5/10 intensity. Unsure if anything made it worse, but felt better after taking ASA, and reports that chest pain lasted approximately five minutes and had dissipated by the time EMS arrived. Denies any associated N/V, SOB, numbness/tingling, jaw pain, lightheadedness, dizziness, palpitations, or syncope. At baseline, patient is very active and walks two miles a day without symptoms of chest pain, exertional dyspnea, or claudication. Denies any orthopnea, PND or LEE. Wife at bedside states that he is very compliant with his medications, and was able to take his morning doses before the chest pain started. Patient states that he has not experienced any chest pain since his heart attack in 1995, and at that time, the pain was much more intense and painful. Patient is unable to verbally differentiate the two episodes of chest pain well, but  reports "this is definitely not the same pain as before, believe me, I would remember."   Of note, patient was recently discharged from hospital on 12/30/17 after undergoing a laparoscopic cholecystectomy. At that time, hospitalization was prolonged due to preop atrial fibrillation with RVR and postop by ICD shock.   Last outpatient office visit with Dr. Acie Fredrickson took place on 01/20/18. At this time, patient had no complaints of angina but did endorse a dry cough with 17 pound weight loss since August 2018. Plan was to continue with current medication regimen and to decrease patient's Amiodarone dose after being stable for an additional six months.   Today, patient was given 324 mg ASA by EMS and was without chest pain on arrival. In ED, patient is afebrile, slightly hypotensive and tachypneic. Troponin negative x 2. EKG shows NSR, HR 70, with RBBB and Q waves demonstrating old antero-lateral infarct. No acute findings per medtronic ICD interrogation. CXR shows right pleural effusion, likely superimposed right basilar atelectasis or pneumonia. BUN/Cr slightly elevated at 22/1.45 (baseline creatinine around 1.07 in 11/2017).    Currently, patient is chest pain free without any complaints.    Past Medical History:  Diagnosis Date  . Atrial arrhythmia/a fibrillation    not on coumadin 2/2 subdural hematoma  . Automatic implantable cardiac defibrillator in situ    medtronic  . Automatic implantable cardioverter-defibrillator in situ   . CHF (congestive heart failure) ([place])   . Chronic systolic congestive heart failure ([place])   . Dysrhythmia   . Encounter for long-term (current) use of other medications    amiodarone  . H/O hiatal hernia   .  Hyperlipidemia   . Hypertension   . Hypothyroidism   . Ischemic cardiomyopathy    status post CABG with patent vein grafts and an atretic LIMA to his ramus, January 2012  . Myocardial infarction [facility])    1995, non-STEMI January 2012  . Pacemaker   .  Subdural hematoma ([facility])    on coumadin  . Thyroid disease    hypothyroid  . Ventricular fibrillation ([place])   . Ventricular tachycardia ([place])     VT Storm-January 2012- responded to Amiodarone    Past Surgical History:  Procedure Laterality Date  . bilateral cranitomies for sub hematomas    . BYPASS GRAFT  1995   5  . CHOLECYSTECTOMY N/A 12/26/2017   Procedure: LAPAROSCOPIC CHOLECYSTECTOMY;  Surgeon: Ileana Roup, MD;  Location: [place];  Service: General;  Laterality: N/A;  . CORONARY ARTERY BYPASS GRAFT  1995   5  . IMPLANTABLE CARDIOVERTER DEFIBRILLATOR GENERATOR CHANGE N/A 03/28/2013   Procedure: IMPLANTABLE CARDIOVERTER DEFIBRILLATOR GENERATOR CHANGE;  Surgeon: Deboraha Sprang, MD;  Location: [facility] CATH LAB;  Service: Cardiovascular;  Laterality: N/A;  . INGUINAL HERNIA REPAIR Left 05/15/2014   Procedure: HERNIA REPAIR INGUINAL ADULT;  Surgeon: Zenovia Jarred, MD;  Location: [place];  Service: General;  Laterality: Left;  . INSERT / REPLACE / REMOVE PACEMAKER  14    generator repl   . INSERTION OF MESH Left 05/15/2014   Procedure: INSERTION OF MESH;  Surgeon: Zenovia Jarred, MD;  Location: [place];  Service: General;  Laterality: Left;  . TONSILLECTOMY       Medications Prior to Admission: Prior to Admission medications   Medication Sig Start Date End Date Taking? Authorizing Provider  amiodarone (PACERONE) 100 MG tablet Take 0.5 tablets (50 mg total) by mouth daily. 11/30/17   Deboraha Sprang, MD  aspirin EC 81 MG tablet Take 81 mg by mouth daily.    [provider]  b complex vitamins tablet Take 1 tablet by mouth daily.    [provider]  carvedilol (COREG) 6.25 MG tablet TAKE 1 TABLET BY MOUTH 2 TIMES DAILY WITH A MEAL. Patient taking differently: TAKE 1 TABLET (6.25 MG) BY MOUTH 2 TIMES DAILY WITH A MEAL. 07/02/17   Deboraha Sprang, MD  Coenzyme Q10 (COQ10 PO) Take 1 capsule by mouth daily.    [provider]  donepezil (ARICEPT) 5 MG tablet Take  5 mg by mouth daily.     [provider]  fenofibrate 160 MG tablet Take 1 tablet (160 mg total) by mouth daily. 09/17/17   Nahser, Wonda Cheng, MD  finasteride (PROSCAR) 5 MG tablet Take 5 mg by mouth daily. 04/01/16   [provider]  hydrocortisone (ANUSOL-HC) 2.5 % rectal cream Apply 1 application topically 4 (four) times daily as needed for hemorrhoids. 12/30/17   Aline August, MD  levothyroxine (SYNTHROID, LEVOTHROID) 75 MCG tablet Take 1 tablet (75 mcg total) by mouth daily before breakfast. 12/30/17   Aline August, MD  lisinopril (PRINIVIL,ZESTRIL) 5 MG tablet Take 1 tablet (5 mg total) by mouth daily. 02/10/18   Nahser, Wonda Cheng, MD  LYCOPENE PO Take 1 capsule by mouth daily.    [provider]  methocarbamol (ROBAXIN) 500 MG tablet Take 2 tablets (1,000 mg total) by mouth every 6 (six) hours as needed for muscle spasms. 12/30/17   Aline August, MD  Multiple Vitamin (MULTIVITAMIN WITH MINERALS) TABS tablet Take 1 tablet by mouth daily.    [provider]  nitroGLYCERIN (NITROSTAT) 0.4 MG SL tablet Place 1 tablet (0.4 mg total) under the tongue every 5 (five) minutes as needed for chest pain. For chest pain x 3 doses Patient taking differently: Place 0.4 mg under the tongue every 5 (five) minutes as needed for chest pain (max 3 doses).  03/22/13   Deboraha Sprang, MD  ondansetron (ZOFRAN) 4 MG tablet Take 1 tablet (4 mg total) by mouth every 6 (six) hours as needed for nausea. 12/30/17   Aline August, MD  OVER THE COUNTER MEDICATION Take 1 tablet by mouth daily. "prostrate essentials"    [provider]  spironolactone (ALDACTONE) 25 MG tablet Take 1 tablet (25 mg total) by mouth daily. 11/30/17   Nahser, Wonda Cheng, MD  Tamsulosin HCl (FLOMAX) 0.4 MG CAPS Take 0.4 mg by mouth at bedtime.  03/31/11   [provider]  traMADol (ULTRAM) 50 MG tablet Take 1 tablet (50 mg total) by mouth every 6 (six) hours as needed for moderate pain. 12/30/17   Aline August, MD     Allergies:   No Known Allergies  Social History:   Social History   Socioeconomic History  . Marital status: Married    Spouse name: Not on file  . Number of children: Not on file  . Years of education: Not on file  . Highest education level: Not on file  Occupational History  . Not on file  Social Needs  . Financial resource strain: Not on file  . Food insecurity:    Worry: Not on file    Inability: Not on file  . Transportation needs:    Medical: Not on file    Non-medical: Not on file  Tobacco Use  . Smoking status: Never Smoker  . Smokeless tobacco: Former Network engineer and Sexual Activity  . Alcohol use: No  . Drug use: No  . Sexual activity: Not on file  Lifestyle  . Physical activity:    Days per week: Not on file    Minutes per session: Not on file  . Stress: Not on file  Relationships  . Social connections:    Talks on phone: Not on file    Gets together: Not on file    Attends religious service: Not on file    Active member of club or organization: Not on file    Attends meetings of clubs or organizations: Not on file    Relationship status: Not on file  . Intimate partner violence:    Fear of current or ex partner: Not on file    Emotionally abused: Not on file    Physically abused: Not on file    Forced sexual activity: Not on file  Other Topics Concern  . Not on file  Social History Narrative  . Not on file    Family History:   The patient's family history includes Cerebral aneurysm in his father; Hypertension in his father.    ROS:  Please see the history of present illness.  All other ROS reviewed and negative.     Physical Exam/Data:   Vitals:   03/22/18 1345 03/22/18 1400 03/22/18 1415 03/22/18 1445  BP: (!) 108/54 (!) 110/55 (!) 110/57 (!) 107/52  Pulse: 63 63 70 68  Resp: (!) 30 (!) 26 (!) 24 (!) 29  Temp:      TempSrc:      SpO2: 94% 95% 95% 94%  Weight:      Height:  No intake or output data in the  24 hours ending 03/22/18 1533 Filed Weights   03/22/18 1038  Weight: 155 lb (70.3 kg)   Body mass index is 21.62 kg/m.  General:  Well nourished, well developed, in no acute distress HEENT: normal Lymph: no adenopathy Neck: no JVD Endocrine:  No thryomegaly Vascular: No carotid bruits; FA pulses 2+ bilaterally without bruits  Cardiac:  normal S1, S2; RRR; no M/R/G Lungs:  Decreased breath sounds in RLL with scattered crackles on upper right lung fields  Abd: soft, nontender, no hepatomegaly  Ext: no lower extremity edema bilaterally  Musculoskeletal:  No deformities, BUE and BLE strength normal and equal Skin: warm and dry, decreased skin turgor  Neuro:  CNs 2-12 intact, no focal abnormalities noted Psych:  Normal affect   EKG:  The ECG that was done 03/22/18 was personally reviewed and demonstrates NSR, HR 70, with RBBB and Q waves demonstrating old antero-lateral infarct. No acute ischemic changes.   Relevant CV Studies:  Echo 12/22/17:  - Left ventricle: The cavity size was mildly dilated. Wall   thickness was normal. Systolic function was severely reduced. The   estimated ejection fraction was in the range of 25% to 30%. There   is akinesis of the mid-apicalanteroseptal, anterior,   anterolateral, lateral, inferolateral, and apical myocardium.   Doppler parameters are consistent with abnormal left ventricular   relaxation (grade 1 diastolic dysfunction). Acoustic contrast   opacification revealed no evidence ofthrombus. - Mitral valve: Moderately calcified annulus. - Left atrium: The atrium was severely dilated. - Right ventricle: Pacer wire or catheter noted in right ventricle.  Cath Lab 01/21/2011  IMPRESSION: 1. Triple-vessel coronary artery disease, status post five-vessel     coronary artery bypass graft with 4/5 patent bypass grafts. 2. No indication currently for percutaneous coronary intervention. 3. Severe left ventricular systolic dysfunction.  Laboratory  Data:  Chemistry Recent Labs  Lab 03/22/18 1051  NA 138  K 4.2  CL 107  CO2 22  GLUCOSE 113*  BUN 22*  CREATININE 1.45*  CALCIUM 8.6*  GFRNONAA 44*  GFRAA 51*  ANIONGAP 9    No results for input(s): PROT, ALBUMIN, AST, ALT, ALKPHOS, BILITOT in the last 168 hours. Hematology Recent Labs  Lab 03/22/18 1051  WBC 10.3  RBC 3.92*  HGB 12.1*  HCT 37.0*  MCV 94.4  MCH 30.9  MCHC 32.7  RDW 15.6*  PLT 244   Cardiac EnzymesNo results for input(s): TROPONINI in the last 168 hours.  Recent Labs  Lab 03/22/18 1056 03/22/18 1401  TROPIPOC 0.00 0.00    BNPNo results for input(s): BNP, PROBNP in the last 168 hours.  DDimer No results for input(s): DDIMER in the last 168 hours.  Radiology/Studies:  Dg Chest 2 View  Result Date: 03/22/2018 CLINICAL DATA:  Chest pain radiating across the chest began today. History of previous MI, CHF, CABG. EXAM: CHEST - 2 VIEW COMPARISON:  PA and lateral chest x-ray of January 20, 2018 FINDINGS: There is a moderate-sized right pleural effusion. There is right basilar parenchymal density as well. The left lung is clear. The heart is top-normal in size. The pulmonary vascularity is not engorged. The ICD is in stable position. The sternal wires are intact. There is calcification in the wall of the aortic arch. There is multilevel degenerative disc disease of the thoracic spine. IMPRESSION: New right pleural effusion. There is likely superimposed right basilar atelectasis or pneumonia. Top-normal cardiac size without overt pulmonary edema. Previous CABG. Thoracic  aortic atherosclerosis. Electronically Signed   By: David  Martinique M.D.   On: 03/22/2018 12:26    Assessment and Plan:   1. CAD, hx of MI and CABG 1995  - EKG shows NSR, HR 70, with RBBB and Q waves demonstrating old antero-lateral infarct; no acute ischemic changes  - troponin negative x 2; continue to trend  - CXR shows right pleural effusion, likely superimposed right basilar atelectasis or  pneumonia. - currently chest pain free without any complaints  - with hx of MI and CABG, consider cath vs. Stress test only if troponin trend changes overnight  - Continue medical management with carvedilol, Lisinopril, ASA and Spironolactone  - Consider addition of Entresto outpatient to optimize medical management; 36 hour washout period of ACEi  2. Chronic systolic congestive heart failure  - last echo 12/22/17 showed reduced EF 25-30%, G1DD and severely dilated left atrium  - CXR demonstrates right pleural effusion (this is new compared to CXR on 01/20/18) - patient without complaints of dyspnea or pleuritic CP, but is tachypenic on exam and reports a few days of right-sided low back pain  - Physical exam revealed decreased breath sound in right RLL with crackles in upper lung fields, but negative for LEE, ascites, JVD    3. Right-Sided Pleural Effusion  - believe patient's chest pain is less likely a cardiac etiology; possible complication from previous cholecystectomy in 11/2017  - Discussed patient case with pulmonology who will see tomorrow; appreciate recommendations  - PE unlikely due to clinical picture and risk factors, but will check D-Dimer to r/o - Pulmonology recommended CT imaging of lungs without contrast - Consider mild hydration with IVF, 1 L overnight (patient has decreased skin turgor, elevated creatinine)   4. HLD - managed by PCP outpatient, Dr. Lajuan Lines  - Continue Fenofibrate   5. HTN  - BP stable  - continue medical management   6. Ventricular tachycardia  - on low dose Amiodarone - per Dr. Elmarie Shiley note on 01/20/18, Amiodarone dose will be decreased after being stable for an additional six months  7. Hx of subdural hematoma  - history of Afib, not on anticoagulation   8. Hypothyroidism  - managed by PCP outpatient, Dr. Reynaldo Minium  - recheck TSH inpatient   Severity of Illness: The appropriate patient status for this patient is INPATIENT. Inpatient status  is judged to be reasonable and necessary in order to provide the required intensity of service to ensure the patient's safety. The patient's presenting symptoms, physical exam findings, and initial radiographic and laboratory data in the context of their chronic comorbidities is felt to place them at high risk for further clinical deterioration. Furthermore, it is not anticipated that the patient will be medically stable for discharge from the hospital within 2 midnights of admission. The following factors support the patient status of inpatient.   " The patient's presenting symptoms include chest pain. " The worrisome physical exam findings include decreased breath sounds in RLL with scattered crackles              in upper lung fields. " The initial radiographic and laboratory data are worrisome because of right-sided pleural effusion on CXR.  " The chronic co-morbidities include CAD s/p cath 2012, MI and CABG (8099), chronic systolic HF, ischemic cardiomyopathy, ventricular tachycardia storm (currently controlled on Amiodarone), history of ICD, atrial fibrillation (not on anticoagulation d/t hx of subdural hematoma while on coumadin), HTN, HLD, and hypothyroidism.    * I certify that at the  point of admission it is my clinical judgment that the patient will require inpatient hospital care spanning beyond 2 midnights from the point of admission due to high intensity of service, high risk for further deterioration and high frequency of surveillance required.*    For questions or updates, please contact [place] Please consult [email or web address] for contact info under Cardiology/STEMI.    Signed, Katina Degree, Student-PA  03/22/2018 3:33 PM   Attending Note:   The patient was seen and examined.  Agree with assessment and plan as noted above.  Changes made to the above note as needed.  Patient seen and independently examined with Rosalie Gums, PA-student.   We discussed all aspects of  the encounter. I agree with the assessment and plan as stated above.  1.  Right pleural effusion: The patient presents with 10-15 minutes of chest pain starting this morning.  The pain was completely different than his presenting angina years ago.  His wife notes that he is been complaining of a little bit of right-sided chest pain for the past several days but he did not recall.  I saw him in the office about 1 month ago following gallbladder surgery.  He was feeling puny at that time.  He is really not recovered well at all from his gallbladder surgery.  He had lost 17 pounds and his appetite was very poor.  Is possible that these are related.  He is in the ER with chest pain but I doubt that this is a coronary issue.  We will get a CT scan of the lungs tonight Tonight  I doubt this is due to congestive heart failure.  If anything, I think he is a little dehydrated. I doubt this is due to a pulmonary embolus.  We will check a d-dimer to rule out pulmonary embolus. We will get a CT scan to evaluate him for cancer or other causes. I requested for pulmonary consultation tomorrow.  2.  Chest discomfort: The patient's chest pain is very atypical for coronary artery disease.  He does have known CAD and CHF but these symptoms are completely different.  We will get serial troponin levels to rule out myocardial infarction.  If his enzymes are negative, I do not think  he needs any additional ischemic workup at this time.  3.  Chronic combined systolic and diastolic congestive heart failure: And has been very stable with his heart failure.  I do not think that he is volume overloaded.  In fact, I think he is somewhat volume depleted.  This is probably because of his relatively poor p.o. intake over the past month.  4.  Paroxysmal atrial fibrillation: Continue amiodarone.  We have lowered the dose slightly because of the worry of amiodarone toxicity.  4.  History of ventricular tachycardia: He status post  ICD placement.  He seems to be stable on amiodarone. We will check a TSH because of his long history of amiodarone therapy.   I have spent a total of 40 minutes with patient reviewing hospital  notes , telemetry, EKGs, labs and examining patient as well as establishing an assessment and plan that was discussed with the patient. > 50% of time was spent in direct patient care.   Thayer Headings, Brooke Bonito., MD, [facility] 03/22/2018, 6:00 PM [address],  [place] Pager [phone]

## 2018-03-22 NOTE — ED Notes (Signed)
Patient transported to X-ray 

## 2018-03-22 NOTE — ED Provider Notes (Signed)
[facility] EMERGENCY DEPARTMENT Provider Note   CSN: [id number] Arrival date & time: 03/22/18  1025     History   Chief Complaint Chief Complaint  Patient presents with  . Chest Pain    HPI [name] is a 83 y.o. male.  HPI 83 year old Caucasian male past medical history significant for CHF last EF was 25-30%, hypertension, CAD status post CABG in 2012 that presents to the emergency department today for evaluation of chest pain.  Patient states he awoke from sleep this morning.  Patient was sitting there when he started having some chest pressure that lasted for approximate 1-2 minutes and self resolved.  Patient states the pain radiated to his left arm.  Patient has had no pain since.  Patient states that the pain was not like his prior MI.  Patient denies any associated diaphoresis, nausea, emesis or shortness of breath.  Patient again has had no pain since this morning.  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  This has since resolved.  Patient denies any paresthesias or weakness.  Denies any altered mental status.  Patient denies any productive cough, fevers, nausea or vomiting.  Pt denies any fever, chill, ha, vision changes, lightheadedness, dizziness, congestion, neck pain,sob, cough, abd pain, n/v/d, urinary symptoms, change in bowel habits, melena, hematochezia, lower extremity paresthesias.  Past Medical History:  Diagnosis Date  . Atrial arrhythmia/a fibrillation    not on coumadin 2/2 subdural hematoma  . Automatic implantable cardiac defibrillator in situ    medtronic  . Automatic implantable cardioverter-defibrillator in situ   . CHF (congestive heart failure) ([place])   . Chronic systolic congestive heart failure ([place])   . Dysrhythmia   . Encounter for long-term (current) use of other medications    amiodarone  . H/O hiatal hernia   . Hyperlipidemia   . Hypertension   . Hypothyroidism   .  Ischemic cardiomyopathy    status post CABG with patent vein grafts and an atretic LIMA to his ramus, January 2012  . Myocardial infarction [facility])    1995, non-STEMI January 2012  . Pacemaker   . Subdural hematoma (HCC)    on coumadin  . Thyroid disease    hypothyroid  . Ventricular fibrillation ([place])   . Ventricular tachycardia ([place])     VT [place]-January 2012- responded to Amiodarone    Patient Active Problem List   Diagnosis Date Noted  . Paroxysmal atrial fibrillation (HCC)   . Sepsis ([place]) 12/25/2017  . Empyema of gallbladder s/p lap cholecystectomy 12/26/2017 12/25/2017  . Dementia 12/25/2017  . AKI (acute kidney injury) ([place]) 12/24/2017  . Cholelithiasis 12/21/2017  . Chest pain 05/19/2016  . CAD (coronary artery disease) 01/23/2016  . Dyslipidemia 07/26/2014  . S/P inguinal hernia repair 05/15/2014  . Left inguinal hernia 04/05/2014  . Atrial oversensing on his defibrillator 12/09/2013  . Palpitations 12/09/2013  . Weight loss 07/28/2013  . hypertransaminasemia 04/01/2011  . Ischemic cardiomyopathy   . Ventricular tachycardia ([place])   . Ventricular fibrillation ([place])   . Chronic systolic CHF (congestive heart failure) ([place])   . Encounter for long-term (current) use of other medications   . Atrial arrhythmia/a fibrillation   . ICD (implantable cardioverter-defibrillator) in place   . Hyperlipidemia LDL goal <70 03/19/2010    Past Surgical History:  Procedure Laterality Date  . bilateral cranitomies for sub hematomas    . BYPASS GRAFT  1995   5  .  CHOLECYSTECTOMY N/A 12/26/2017   Procedure: LAPAROSCOPIC CHOLECYSTECTOMY;  Surgeon: Ileana Roup, MD;  Location: [facility];  Service: General;  Laterality: N/A;  . CORONARY ARTERY BYPASS GRAFT  1995   5  . IMPLANTABLE CARDIOVERTER DEFIBRILLATOR GENERATOR CHANGE N/A 03/28/2013   Procedure: IMPLANTABLE CARDIOVERTER DEFIBRILLATOR GENERATOR CHANGE;  Surgeon: Deboraha Sprang, MD;  Location: [facility] CATH LAB;  Service:  Cardiovascular;  Laterality: N/A;  . INGUINAL HERNIA REPAIR Left 05/15/2014   Procedure: HERNIA REPAIR INGUINAL ADULT;  Surgeon: Zenovia Jarred, MD;  Location: [facility];  Service: General;  Laterality: Left;  . INSERT / REPLACE / REMOVE PACEMAKER  14    generator repl   . INSERTION OF MESH Left 05/15/2014   Procedure: INSERTION OF MESH;  Surgeon: Zenovia Jarred, MD;  Location: [place];  Service: General;  Laterality: Left;  . TONSILLECTOMY          Home Medications    Prior to Admission medications   Medication Sig Start Date End Date Taking? Authorizing Provider  amiodarone (PACERONE) 100 MG tablet Take 0.5 tablets (50 mg total) by mouth daily. 11/30/17   Deboraha Sprang, MD  aspirin EC 81 MG tablet Take 81 mg by mouth daily.    [provider]  b complex vitamins tablet Take 1 tablet by mouth daily.    [provider]  carvedilol (COREG) 6.25 MG tablet TAKE 1 TABLET BY MOUTH 2 TIMES DAILY WITH A MEAL. Patient taking differently: TAKE 1 TABLET (6.25 MG) BY MOUTH 2 TIMES DAILY WITH A MEAL. 07/02/17   Deboraha Sprang, MD  Coenzyme Q10 (COQ10 PO) Take 1 capsule by mouth daily.    [provider]  donepezil (ARICEPT) 5 MG tablet Take 5 mg by mouth daily.     [provider]  fenofibrate 160 MG tablet Take 1 tablet (160 mg total) by mouth daily. 09/17/17   Nahser, Wonda Cheng, MD  finasteride (PROSCAR) 5 MG tablet Take 5 mg by mouth daily. 04/01/16   [provider]  hydrocortisone (ANUSOL-HC) 2.5 % rectal cream Apply 1 application topically 4 (four) times daily as needed for hemorrhoids. 12/30/17   Aline August, MD  levothyroxine (SYNTHROID, LEVOTHROID) 75 MCG tablet Take 1 tablet (75 mcg total) by mouth daily before breakfast. 12/30/17   Aline August, MD  lisinopril (PRINIVIL,ZESTRIL) 5 MG tablet Take 1 tablet (5 mg total) by mouth daily. 02/10/18   Nahser, Wonda Cheng, MD  LYCOPENE PO Take 1 capsule by mouth daily.    [provider]  methocarbamol  (ROBAXIN) 500 MG tablet Take 2 tablets (1,000 mg total) by mouth every 6 (six) hours as needed for muscle spasms. 12/30/17   Aline August, MD  Multiple Vitamin (MULTIVITAMIN WITH MINERALS) TABS tablet Take 1 tablet by mouth daily.    [provider]  nitroGLYCERIN (NITROSTAT) 0.4 MG SL tablet Place 1 tablet (0.4 mg total) under the tongue every 5 (five) minutes as needed for chest pain. For chest pain x 3 doses Patient taking differently: Place 0.4 mg under the tongue every 5 (five) minutes as needed for chest pain (max 3 doses).  03/22/13   Deboraha Sprang, MD  ondansetron (ZOFRAN) 4 MG tablet Take 1 tablet (4 mg total) by mouth every 6 (six) hours as needed for nausea. 12/30/17   Aline August, MD  OVER THE COUNTER MEDICATION Take 1 tablet by mouth daily. "prostrate essentials"    [provider]  spironolactone (ALDACTONE) 25 MG tablet Take 1  tablet (25 mg total) by mouth daily. 11/30/17   Nahser, Wonda Cheng, MD  Tamsulosin HCl (FLOMAX) 0.4 MG CAPS Take 0.4 mg by mouth at bedtime.  03/31/11   [provider]  traMADol (ULTRAM) 50 MG tablet Take 1 tablet (50 mg total) by mouth every 6 (six) hours as needed for moderate pain. 12/30/17   Aline August, MD    Family History Family History  Problem Relation Age of Onset  . Cerebral aneurysm Father   . Hypertension Father     Social History Social History   Tobacco Use  . Smoking status: Never Smoker  . Smokeless tobacco: Former Network engineer Use Topics  . Alcohol use: No  . Drug use: No     Allergies   Patient has no known allergies.   Review of Systems Review of Systems  All other systems reviewed and are negative.    Physical Exam Updated Vital Signs BP 105/63   Pulse 61   Temp 98.3 F (36.8 C) (Oral)   Resp (!) 30   Ht 5\' 11"  (1.803 m)   Wt 70.3 kg (155 lb)   SpO2 96%   BMI 21.62 kg/m   Physical Exam  Constitutional: He is oriented to person, place, and time. He appears well-developed and  well-nourished.  Non-toxic appearance. No distress.  HENT:  Head: Normocephalic and atraumatic.  Nose: Nose normal.  Mouth/Throat: Oropharynx is clear and moist.  Eyes: Pupils are equal, round, and reactive to light. Conjunctivae are normal. Right eye exhibits no discharge. Left eye exhibits no discharge.  Neck: Normal range of motion. Neck supple. No JVD present. No tracheal deviation present.  Cardiovascular: Normal rate, regular rhythm, normal heart sounds and intact distal pulses. Exam reveals no gallop and no friction rub.  No murmur heard. Pulses:      Radial pulses are 2+ on the right side, and 2+ on the left side.       Dorsalis pedis pulses are 2+ on the right side, and 2+ on the left side.  Pulmonary/Chest: Effort normal and breath sounds normal. No respiratory distress. He has no decreased breath sounds. He has no wheezes. He has no rhonchi. He has no rales. He exhibits no tenderness.  No hypoxia or tachypnea.  Abdominal: Soft. Bowel sounds are normal. He exhibits no distension. There is no tenderness. There is no rebound and no guarding.  Musculoskeletal: Normal range of motion.       Right lower leg: Normal. He exhibits no edema.       Left lower leg: Normal. He exhibits no edema.  No lower extremity edema or calf tenderness.  Lymphadenopathy:    He has no cervical adenopathy.  Neurological: He is alert and oriented to person, place, and time.  The patient is alert, attentive, and oriented x 3. Speech is clear. Cranial nerve II-VII grossly intact. Negative pronator drift. Sensation intact. Strength 5/5 in all extremities. Reflexes 2+ and symmetric at biceps, triceps, knees, and ankles. Rapid alternating movement and fine finger movements intact. Romberg is absent. Posture and gait normal.   Skin: Skin is warm and dry. Capillary refill takes less than 2 seconds. He is not diaphoretic.  Psychiatric: His behavior is normal. Judgment and thought content normal.  Nursing note and  vitals reviewed.    ED Treatments / Results  Labs (all labs ordered are listed, but only abnormal results are displayed) Labs Reviewed  BASIC METABOLIC PANEL - Abnormal; Notable for the following components:  Result Value   Glucose, Bld 113 (*)    BUN 22 (*)    Creatinine, Ser 1.45 (*)    Calcium 8.6 (*)    GFR calc non Af Amer 44 (*)    GFR calc Af Amer 51 (*)    All other components within normal limits  CBC - Abnormal; Notable for the following components:   RBC 3.92 (*)    Hemoglobin 12.1 (*)    HCT 37.0 (*)    RDW 15.6 (*)    All other components within normal limits  CBG MONITORING, ED  I-STAT TROPONIN, ED  I-STAT TROPONIN, ED    EKG EKG Interpretation  Date/Time:  Monday March 22 2018 10:38:03 EDT Ventricular Rate:  63 PR Interval:    QRS Duration: 159 QT Interval:  437 QTC Calculation: 448 R Axis:   -64 Text Interpretation:  Sinus rhythm IVCD, consider atypical RBBB Anterior infarct, age indeterminate Lateral leads are also involved Confirmed by Quintella Reichert [phone]) on 03/22/2018 10:44:53 AM   Radiology Dg Chest 2 View  Result Date: 03/22/2018 CLINICAL DATA:  Chest pain radiating across the chest began today. History of previous MI, CHF, CABG. EXAM: CHEST - 2 VIEW COMPARISON:  PA and lateral chest x-ray of January 20, 2018 FINDINGS: There is a moderate-sized right pleural effusion. There is right basilar parenchymal density as well. The left lung is clear. The heart is top-normal in size. The pulmonary vascularity is not engorged. The ICD is in stable position. The sternal wires are intact. There is calcification in the wall of the aortic arch. There is multilevel degenerative disc disease of the thoracic spine. IMPRESSION: New right pleural effusion. There is likely superimposed right basilar atelectasis or pneumonia. Top-normal cardiac size without overt pulmonary edema. Previous CABG. Thoracic aortic atherosclerosis. Electronically Signed   By: David   Martinique M.D.   On: 03/22/2018 12:26    Procedures Procedures (including critical care time)  Medications Ordered in ED Medications  aspirin chewable tablet 324 mg (324 mg Oral Not Given 03/22/18 1211)     Initial Impression / Assessment and Plan / ED Course  I have reviewed the triage vital signs and the nursing notes.  Pertinent labs & imaging results that were available during my care of the patient were reviewed by me and considered in my medical decision making (see chart for details).     Patient presents to the emergency department today with complaints of 2 minutes of left-sided chest pain rating to the left arm that self resolved this morning.  Denies any exertional chest pain or pleuritic chest pain.  Patient does have a history of CAD status post CABG in 2012.  States this was not like his typical chest pain during his MI.  Patient denies any nonproductive cough, fevers, chills.  Denies any associated shortness of breath, nausea or emesis.  Has been symptomatic while in the ED. lungs clear to auscultation.  Heart regular rate and rhythm.  No focal abdominal tenderness.  No focal neuro deficit.  Neurovascularly intact in all extremities.  Patient's lab work is reassuring.  No leukocytosis.  Hemoglobin appears at patient's baseline.  Electrolytes are reassuring.  Mild elevation in creatinine 1.45 which will need to be repeated likely secondary to dehydration.  Negative delta troponins.  EKG shows no acute ischemic changes and appears similar to patient's prior tracings.  Chest x-ray shows small right-sided pleural effusion with possible underlying atelectasis versus pneumonia.  Patient does not complain of any pedal production  with cough.  Denies any associated fevers.  Has no leukocytosis.  Doubt pneumonia.  Given patient's severe EF this is likely secondary to CHF.  Will consult cardiology for recommendations and disposition.  Patient remains hemodynamically stable at this  time.  Spoke with Trish with cardiology, they will see pt and make disposition.   X-ray does show moderate right-sided pleural effusion.  Patient does remain tachypneic.  This could be causing patient's symptoms.  However have very low suspicion that this is underlying pneumonia given that patient has no productive cough, fevers or leukocytosis.  Will hold on antibiotics at this time.  Will wait for cardiology to see patient and likely hospital admission for pleural effusion management given history of CHF.  Care handoff to Dr. Rex Kras. Pt has pending at this time cardiology disposition.  Disposition likely admitting pending lab and test results. Care dicussed and plan agreed upon with oncoming PA. Pt updated on plan of care and is currently hemodynamically stable at this time with normal vs.     Pt seen by my attending who is agreeable with the above plan.   Final Clinical Impressions(s) / ED Diagnoses   Final diagnoses:  Nonspecific chest pain  Pleural effusion    ED Discharge Orders    None       Aaron Edelman 03/22/18 1626    Quintella Reichert, MD 03/23/18 1649

## 2018-03-23 ENCOUNTER — Inpatient Hospital Stay (HOSPITAL_COMMUNITY): Payer: Medicare Other

## 2018-03-23 DIAGNOSIS — J90 Pleural effusion, not elsewhere classified: Secondary | ICD-10-CM

## 2018-03-23 LAB — LIPID PANEL
CHOLESTEROL: 92 mg/dL (ref 0–200)
HDL: 22 mg/dL — ABNORMAL LOW (ref >40)
LDL Cholesterol: 58 mg/dL (ref 0–99)
TRIGLYCERIDES: 59 mg/dL (ref <150)
Total CHOL/HDL Ratio: 4.2 RATIO
VLDL: 12 mg/dL (ref 0–40)

## 2018-03-23 LAB — CBC
HEMATOCRIT: 34.1 % — AB (ref 39.0–52.0)
HEMOGLOBIN: 10.9 g/dL — AB (ref 13.0–17.0)
MCH: 30.2 pg (ref 26.0–34.0)
MCHC: 32 g/dL (ref 30.0–36.0)
MCV: 94.5 fL (ref 78.0–100.0)
Platelets: 244 10*3/uL (ref 150–400)
RBC: 3.61 MIL/uL — ABNORMAL LOW (ref 4.22–5.81)
RDW: 15.4 % (ref 11.5–15.5)
WBC: 8.6 10*3/uL (ref 4.0–10.5)

## 2018-03-23 LAB — BASIC METABOLIC PANEL
Anion gap: 12 (ref 5–15)
BUN: 18 mg/dL (ref 6–20)
CALCIUM: 8.2 mg/dL — AB (ref 8.9–10.3)
CHLORIDE: 105 mmol/L (ref 101–111)
CO2: 19 mmol/L — AB (ref 22–32)
CREATININE: 1.23 mg/dL (ref 0.61–1.24)
GFR calc Af Amer: >60 mL/min (ref >60)
GFR calc non Af Amer: 54 mL/min — ABNORMAL LOW (ref >60)
GLUCOSE: 119 mg/dL — AB (ref 65–99)
Potassium: 4 mmol/L (ref 3.5–5.1)
Sodium: 136 mmol/L (ref 135–145)

## 2018-03-23 LAB — HEPATIC FUNCTION PANEL
ALK PHOS: 26 U/L — AB (ref 38–126)
ALT: 15 U/L — ABNORMAL LOW (ref 17–63)
AST: 23 U/L (ref 15–41)
Albumin: 2.4 g/dL — ABNORMAL LOW (ref 3.5–5.0)
BILIRUBIN DIRECT: 1 mg/dL — AB (ref 0.1–0.5)
Indirect Bilirubin: 1.2 mg/dL — ABNORMAL HIGH (ref 0.3–0.9)
Total Bilirubin: 2.2 mg/dL — ABNORMAL HIGH (ref 0.3–1.2)
Total Protein: 6.1 g/dL — ABNORMAL LOW (ref 6.5–8.1)

## 2018-03-23 LAB — HEPARIN LEVEL (UNFRACTIONATED)
Heparin Unfractionated: <0.1 IU/mL — ABNORMAL LOW (ref 0.30–0.70)
Heparin Unfractionated: <0.1 IU/mL — ABNORMAL LOW (ref 0.30–0.70)

## 2018-03-23 LAB — TROPONIN I: Troponin I: <0.03 ng/mL (ref <0.03)

## 2018-03-23 LAB — SURGICAL PCR SCREEN
MRSA, PCR: NEGATIVE
Staphylococcus aureus: NEGATIVE

## 2018-03-23 MED ORDER — FINASTERIDE 5 MG PO TABS
5.0000 mg | ORAL_TABLET | Freq: Every day | ORAL | Status: DC
  Administered 2018-03-23 – 2018-03-31 (×9): 5 mg via ORAL
  Filled 2018-03-23 (×9): qty 1

## 2018-03-23 MED ORDER — CARVEDILOL 6.25 MG PO TABS
6.2500 mg | ORAL_TABLET | Freq: Two times a day (BID) | ORAL | Status: DC
  Administered 2018-03-23 – 2018-03-31 (×17): 6.25 mg via ORAL
  Filled 2018-03-23 (×17): qty 1

## 2018-03-23 MED ORDER — NITROGLYCERIN 0.4 MG SL SUBL: 0.4000 mg | SUBLINGUAL_TABLET | SUBLINGUAL | Status: DC | PRN

## 2018-03-23 MED ORDER — SPIRONOLACTONE 25 MG PO TABS
25.0000 mg | ORAL_TABLET | Freq: Every day | ORAL | Status: DC
  Administered 2018-03-23 – 2018-03-31 (×9): 25 mg via ORAL
  Filled 2018-03-23 (×9): qty 1

## 2018-03-23 MED ORDER — ALPRAZOLAM 0.25 MG PO TABS: 0.2500 mg | ORAL_TABLET | Freq: Two times a day (BID) | ORAL | Status: DC | PRN

## 2018-03-23 MED ORDER — FENOFIBRATE 160 MG PO TABS
160.0000 mg | ORAL_TABLET | Freq: Every day | ORAL | Status: DC
  Administered 2018-03-23 – 2018-03-31 (×9): 160 mg via ORAL
  Filled 2018-03-23 (×9): qty 1

## 2018-03-23 MED ORDER — IOPAMIDOL (ISOVUE-370) INJECTION 76%
INTRAVENOUS | Status: AC
  Administered 2018-03-23: 80 mL
  Filled 2018-03-23: qty 100

## 2018-03-23 MED ORDER — LEVOTHYROXINE SODIUM 75 MCG PO TABS
75.0000 ug | ORAL_TABLET | Freq: Every day | ORAL | Status: DC
  Administered 2018-03-23 – 2018-03-31 (×10): 75 ug via ORAL
  Filled 2018-03-23 (×10): qty 1

## 2018-03-23 MED ORDER — ONDANSETRON HCL 4 MG PO TABS
4.0000 mg | ORAL_TABLET | Freq: Four times a day (QID) | ORAL | Status: DC | PRN
Start: 2018-03-23 — End: 2018-03-31

## 2018-03-23 MED ORDER — DONEPEZIL HCL 5 MG PO TABS
5.0000 mg | ORAL_TABLET | Freq: Every day | ORAL | Status: DC
  Administered 2018-03-23: 5 mg via ORAL
  Filled 2018-03-23: qty 1

## 2018-03-23 MED ORDER — ADULT MULTIVITAMIN W/MINERALS CH
1.0000 | ORAL_TABLET | Freq: Every day | ORAL | Status: DC
  Administered 2018-03-23 – 2018-03-31 (×9): 1 via ORAL
  Filled 2018-03-23 (×9): qty 1

## 2018-03-23 MED ORDER — ONDANSETRON HCL 4 MG/2ML IJ SOLN: 4.0000 mg | Freq: Four times a day (QID) | INTRAMUSCULAR | Status: DC | PRN

## 2018-03-23 MED ORDER — ASPIRIN EC 81 MG PO TBEC
81.0000 mg | DELAYED_RELEASE_TABLET | Freq: Every day | ORAL | Status: DC
  Administered 2018-03-23 – 2018-03-31 (×9): 81 mg via ORAL
  Filled 2018-03-23 (×9): qty 1

## 2018-03-23 MED ORDER — TRAMADOL HCL 50 MG PO TABS
50.0000 mg | ORAL_TABLET | Freq: Four times a day (QID) | ORAL | Status: DC | PRN
  Administered 2018-03-27: 50 mg via ORAL
  Filled 2018-03-23: qty 1

## 2018-03-23 MED ORDER — AMIODARONE HCL 100 MG PO TABS
50.0000 mg | ORAL_TABLET | Freq: Every day | ORAL | Status: DC
  Administered 2018-03-23 – 2018-03-31 (×9): 50 mg via ORAL
  Filled 2018-03-23 (×9): qty 1

## 2018-03-23 MED ORDER — B COMPLEX-C PO TABS
1.0000 | ORAL_TABLET | Freq: Every day | ORAL | Status: DC
  Administered 2018-03-23 – 2018-03-31 (×9): 1 via ORAL
  Filled 2018-03-23 (×9): qty 1

## 2018-03-23 MED ORDER — LISINOPRIL 5 MG PO TABS
5.0000 mg | ORAL_TABLET | Freq: Every day | ORAL | Status: DC
  Administered 2018-03-23 – 2018-03-31 (×8): 5 mg via ORAL
  Filled 2018-03-23 (×9): qty 1

## 2018-03-23 MED ORDER — METHOCARBAMOL 500 MG PO TABS
1000.0000 mg | ORAL_TABLET | Freq: Four times a day (QID) | ORAL | Status: DC | PRN
  Filled 2018-03-23: qty 2

## 2018-03-23 MED ORDER — ACETAMINOPHEN 325 MG PO TABS
650.0000 mg | ORAL_TABLET | ORAL | Status: DC | PRN
  Administered 2018-03-26 – 2018-03-29 (×3): 650 mg via ORAL
  Filled 2018-03-23 (×3): qty 2

## 2018-03-23 MED ORDER — SODIUM CHLORIDE 0.9 % IV SOLN
INTRAVENOUS | Status: AC
  Administered 2018-03-23 (×2): via INTRAVENOUS

## 2018-03-23 MED ORDER — TAMSULOSIN HCL 0.4 MG PO CAPS
0.4000 mg | ORAL_CAPSULE | Freq: Every day | ORAL | Status: DC
  Administered 2018-03-23 – 2018-03-30 (×8): 0.4 mg via ORAL
  Filled 2018-03-23 (×8): qty 1

## 2018-03-23 NOTE — Consult Note (Signed)
Reason for Consult:Loculated pleural effusion Referring Physician: Dr. [name] is an 83 y.o. male.  HPI: 83 yo man with a past history of CAD, s/p CABG, MI, dysrhythmia(VT/VF), chronic systolic left heart failure, ICD, hyperlipidemia, hypertension, subdural hematomas and hypothyroidism who presented yesterday with a c/o of new onset right sided chest pain. Ruled out for MI. CT done to look for PE showed a loculated right pleural effusion. He denies cough, fever, chills, malaise. He was hospitalized in December for cholecystectomy and had a difficult recovery with that.   He was admitted for further workup.   He denies pain and shortness of breath at present  Past Medical History:  Diagnosis Date  . Atrial arrhythmia/a fibrillation    not on coumadin 2/2 subdural hematoma  . Automatic implantable cardiac defibrillator in situ    medtronic  . Automatic implantable cardioverter-defibrillator in situ   . CHF (congestive heart failure) ([place])   . Chronic systolic congestive heart failure ([place])   . Dysrhythmia   . Encounter for long-term (current) use of other medications    amiodarone  . H/O hiatal hernia   . Hyperlipidemia   . Hypertension   . Hypothyroidism   . Ischemic cardiomyopathy    status post CABG with patent vein grafts and an atretic LIMA to his ramus, January 2012  . Myocardial infarction [facility])    1995, non-STEMI January 2012  . Pacemaker   . Subdural hematoma (HCC)    on coumadin  . Thyroid disease    hypothyroid  . Ventricular fibrillation ([place])   . Ventricular tachycardia ([place])     VT Storm-January 2012- responded to Amiodarone    Past Surgical History:  Procedure Laterality Date  . bilateral cranitomies for sub hematomas    . BYPASS GRAFT  1995   5  . CHOLECYSTECTOMY N/A 12/26/2017   Procedure: LAPAROSCOPIC CHOLECYSTECTOMY;  Surgeon: Ileana Roup, MD;  Location: [place];  Service: General;  Laterality: N/A;  . CORONARY ARTERY BYPASS  GRAFT  1995   5  . IMPLANTABLE CARDIOVERTER DEFIBRILLATOR GENERATOR CHANGE N/A 03/28/2013   Procedure: IMPLANTABLE CARDIOVERTER DEFIBRILLATOR GENERATOR CHANGE;  Surgeon: Deboraha Sprang, MD;  Location: [facility] CATH LAB;  Service: Cardiovascular;  Laterality: N/A;  . INGUINAL HERNIA REPAIR Left 05/15/2014   Procedure: HERNIA REPAIR INGUINAL ADULT;  Surgeon: Zenovia Jarred, MD;  Location: [place];  Service: General;  Laterality: Left;  . INSERT / REPLACE / REMOVE PACEMAKER  14    generator repl   . INSERTION OF MESH Left 05/15/2014   Procedure: INSERTION OF MESH;  Surgeon: Zenovia Jarred, MD;  Location: [place];  Service: General;  Laterality: Left;  . TONSILLECTOMY      Family History  Problem Relation Age of Onset  . Cerebral aneurysm Father   . Hypertension Father     Social History:  reports that he has never smoked. He has quit using smokeless tobacco. He reports that he does not drink alcohol or use drugs.  Allergies: No Known Allergies  Medications:  Scheduled: . amiodarone  50 mg Oral Daily  . aspirin EC  81 mg Oral Daily  . B-complex with vitamin C  1 tablet Oral Daily  . carvedilol  6.25 mg Oral BID WC  . donepezil  5 mg Oral Daily  . fenofibrate  160 mg Oral Daily  . finasteride  5 mg Oral Daily  . levothyroxine  75 mcg Oral QAC breakfast  . lisinopril  5 mg  Oral Daily  . multivitamin with minerals  1 tablet Oral Daily  . spironolactone  25 mg Oral Daily  . tamsulosin  0.4 mg Oral QHS    Results for orders placed or performed during the hospital encounter of 03/22/18 (from the past 48 hour(s))  Basic metabolic panel     Status: Abnormal   Collection Time: 03/22/18 10:51 AM  Result Value Ref Range   Sodium 138 135 - 145 mmol/L   Potassium 4.2 3.5 - 5.1 mmol/L   Chloride 107 101 - 111 mmol/L   CO2 22 22 - 32 mmol/L   Glucose, Bld 113 (H) 65 - 99 mg/dL   BUN 22 (H) 6 - 20 mg/dL   Creatinine, Ser 1.45 (H) 0.61 - 1.24 mg/dL   Calcium 8.6 (L) 8.9 - 10.3 mg/dL   GFR calc  non Af Amer 44 (L) >60 mL/min   GFR calc Af Amer 51 (L) >60 mL/min    Comment: (NOTE) The eGFR has been calculated using the CKD EPI equation. This calculation has not been validated in all clinical situations. eGFR's persistently <60 mL/min signify possible Chronic Kidney Disease.    Anion gap 9 5 - 15    Comment: Performed at [facility] [address], [place], [place] [address]  CBC     Status: Abnormal   Collection Time: 03/22/18 10:51 AM  Result Value Ref Range   WBC 10.3 4.0 - 10.5 K/uL   RBC 3.92 (L) 4.22 - 5.81 MIL/uL   Hemoglobin 12.1 (L) 13.0 - 17.0 g/dL   HCT 37.0 (L) 39.0 - 52.0 %   MCV 94.4 78.0 - 100.0 fL   MCH 30.9 26.0 - 34.0 pg   MCHC 32.7 30.0 - 36.0 g/dL   RDW 15.6 (H) 11.5 - 15.5 %   Platelets 244 150 - 400 K/uL    Comment: Performed at [facility], [place] [address] [place], [place] [address]  I-stat troponin, ED (0, 3)     Status: None   Collection Time: 03/22/18 10:56 AM  Result Value Ref Range   Troponin i, poc 0.00 0.00 - 0.08 ng/mL   Comment 3            Comment: Due to the release kinetics of cTnI, a negative result within the first hours of the onset of symptoms does not rule out myocardial infarction with certainty. If myocardial infarction is still suspected, repeat the test at appropriate intervals.   CBG monitoring, ED     Status: None   Collection Time: 03/22/18 12:48 PM  Result Value Ref Range   Glucose-Capillary 94 65 - 99 mg/dL  I-stat troponin, ED (0, 3)     Status: None   Collection Time: 03/22/18  2:01 PM  Result Value Ref Range   Troponin i, poc 0.00 0.00 - 0.08 ng/mL   Comment 3            Comment: Due to the release kinetics of cTnI, a negative result within the first hours of the onset of symptoms does not rule out myocardial infarction with certainty. If myocardial infarction is still suspected, repeat the test at appropriate intervals.   Magnesium     Status: None   Collection Time: 03/22/18  6:29 PM   Result Value Ref Range   Magnesium 1.9 1.7 - 2.4 mg/dL    Comment: Performed at [facility], [place] [address], [place], [place] [address]  TSH     Status:  None   Collection Time: 03/22/18  6:29 PM  Result Value Ref Range   TSH 2.265 0.350 - 4.500 uIU/mL    Comment: Performed by a 3rd Generation assay with a functional sensitivity of <=0.01 uIU/mL. Performed at [facility], [place] [address] [place], [place] [address]   T4, free     Status: Abnormal   Collection Time: 03/22/18  6:29 PM  Result Value Ref Range   Free T4 1.67 (H) 0.61 - 1.12 ng/dL    Comment: (NOTE) Biotin ingestion may interfere with free T4 tests. If the results are inconsistent with the TSH level, previous test results, or the clinical presentation, then consider biotin interference. If needed, order repeat testing after stopping biotin. Performed at [facility], [place] [address] [place], [place] [address]   Troponin I     Status: None   Collection Time: 03/22/18  6:29 PM  Result Value Ref Range   Troponin I <0.03 <0.03 ng/mL    Comment: Performed at [facility] [address] [place], [place] [address]  Hemoglobin A1c     Status: None   Collection Time: 03/22/18  6:29 PM  Result Value Ref Range   Hgb A1c MFr Bld 4.9 4.8 - 5.6 %    Comment: (NOTE) Pre diabetes:          5.7%-6.4% Diabetes:              >6.4% Glycemic control for   <7.0% adults with diabetes    Mean Plasma Glucose 93.93 mg/dL    Comment: Performed at [facility] [address] [place], [place] [address]  Protime-INR     Status: Abnormal   Collection Time: 03/22/18  6:29 PM  Result Value Ref Range   Prothrombin Time 17.0 (H) 11.4 - 15.2 seconds   INR 1.39     Comment: Performed at [facility] [address] [place], [place] [address]  D-dimer, quantitative (not at [facility])     Status: Abnormal   Collection Time: 03/22/18  6:29 PM  Result Value Ref Range   D-Dimer, Quant 1.72 (H) 0.00 - 0.50  ug/mL-FEU    Comment: (NOTE) At the manufacturer cut-off of 0.50 ug/mL FEU, this assay has been documented to exclude PE with a sensitivity and negative predictive value of 97 to 99%.  At this time, this assay has not been approved by the FDA to exclude DVT/VTE. Results should be correlated with clinical presentation. Performed at [facility], [place] [address] [place], [place] [address]   Troponin I     Status: None   Collection Time: 03/23/18  5:20 AM  Result Value Ref Range   Troponin I <0.03 <0.03 ng/mL    Comment: Performed at [facility] [address], [place], [place] [address]  Heparin level (unfractionated)     Status: Abnormal   Collection Time: 03/23/18  5:20 AM  Result Value Ref Range   Heparin Unfractionated <0.10 (L) 0.30 - 0.70 IU/mL    Comment:        IF HEPARIN RESULTS ARE BELOW EXPECTED VALUES, AND PATIENT DOSAGE HAS BEEN CONFIRMED, SUGGEST FOLLOW UP TESTING OF ANTITHROMBIN III LEVELS. Performed at [facility], [place] [address] [place], [place] [address]   CBC     Status: Abnormal   Collection Time: 03/23/18  5:20 AM  Result Value Ref Range   WBC 8.6 4.0 - 10.5 K/uL   RBC  3.61 (L) 4.22 - 5.81 MIL/uL   Hemoglobin 10.9 (L) 13.0 - 17.0 g/dL   HCT 34.1 (L) 39.0 - 52.0 %   MCV 94.5 78.0 - 100.0 fL   MCH 30.2 26.0 - 34.0 pg   MCHC 32.0 30.0 - 36.0 g/dL   RDW 15.4 11.5 - 15.5 %   Platelets 244 150 - 400 K/uL    Comment: Performed at [facility] [address], [place], [place] [address]  Basic metabolic panel     Status: Abnormal   Collection Time: 03/23/18  5:20 AM  Result Value Ref Range   Sodium 136 135 - 145 mmol/L   Potassium 4.0 3.5 - 5.1 mmol/L   Chloride 105 101 - 111 mmol/L   CO2 19 (L) 22 - 32 mmol/L   Glucose, Bld 119 (H) 65 - 99 mg/dL   BUN 18 6 - 20 mg/dL   Creatinine, Ser 1.23 0.61 - 1.24 mg/dL   Calcium 8.2 (L) 8.9 - 10.3 mg/dL   GFR calc non Af Amer 54 (L) >60 mL/min   GFR calc Af Amer >60 >60 mL/min     Comment: (NOTE) The eGFR has been calculated using the CKD EPI equation. This calculation has not been validated in all clinical situations. eGFR's persistently <60 mL/min signify possible Chronic Kidney Disease.    Anion gap 12 5 - 15    Comment: Performed at [facility] [address] [place], [place] [address]  Hepatic function panel     Status: Abnormal   Collection Time: 03/23/18  5:20 AM  Result Value Ref Range   Total Protein 6.1 (L) 6.5 - 8.1 g/dL   Albumin 2.4 (L) 3.5 - 5.0 g/dL   AST 23 15 - 41 U/L   ALT 15 (L) 17 - 63 U/L   Alkaline Phosphatase 26 (L) 38 - 126 U/L   Total Bilirubin 2.2 (H) 0.3 - 1.2 mg/dL   Bilirubin, Direct 1.0 (H) 0.1 - 0.5 mg/dL   Indirect Bilirubin 1.2 (H) 0.3 - 0.9 mg/dL    Comment: Performed at [facility] [address] [place], [place] [address]  Lipid panel     Status: Abnormal   Collection Time: 03/23/18  5:20 AM  Result Value Ref Range   Cholesterol 92 0 - 200 mg/dL   Triglycerides 59 <150 mg/dL   HDL 22 (L) >40 mg/dL   Total CHOL/HDL Ratio 4.2 RATIO   VLDL 12 0 - 40 mg/dL   LDL Cholesterol 58 0 - 99 mg/dL    Comment:        Total Cholesterol/HDL:CHD Risk Coronary Heart Disease Risk Table                     Men   Women  1/2 Average Risk   3.4   3.3  Average Risk       5.0   4.4  2 X Average Risk   9.6   7.1  3 X Average Risk  23.4   11.0        Use the calculated Patient Ratio above and the CHD Risk Table to determine the patient's CHD Risk.        ATP III CLASSIFICATION (LDL):  <100     mg/dL   Optimal  100-129  mg/dL   Near or Above                    Optimal  130-159  mg/dL   Borderline  160-189  mg/dL   High  >190     mg/dL   Very High Performed at [facility] [address], [place], [place] [address]   Heparin level (unfractionated)     Status: Abnormal   Collection Time: 03/23/18 12:35 PM  Result Value Ref Range   Heparin Unfractionated <0.10 (L) 0.30 - 0.70 IU/mL    Comment:        IF  HEPARIN RESULTS ARE BELOW EXPECTED VALUES, AND PATIENT DOSAGE HAS BEEN CONFIRMED, SUGGEST FOLLOW UP TESTING OF ANTITHROMBIN III LEVELS. Performed at [facility], [place] [address], [place], [place] [address]     Dg Chest 2 View  Result Date: 03/22/2018 CLINICAL DATA:  Chest pain radiating across the chest began today. History of previous MI, CHF, CABG. EXAM: CHEST - 2 VIEW COMPARISON:  PA and lateral chest x-ray of January 20, 2018 FINDINGS: There is a moderate-sized right pleural effusion. There is right basilar parenchymal density as well. The left lung is clear. The heart is top-normal in size. The pulmonary vascularity is not engorged. The ICD is in stable position. The sternal wires are intact. There is calcification in the wall of the aortic arch. There is multilevel degenerative disc disease of the thoracic spine. IMPRESSION: New right pleural effusion. There is likely superimposed right basilar atelectasis or pneumonia. Top-normal cardiac size without overt pulmonary edema. Previous CABG. Thoracic aortic atherosclerosis. Electronically Signed   By: David  Martinique M.D.   On: 03/22/2018 12:26   Ct Chest Wo Contrast  Result Date: 03/22/2018 CLINICAL DATA:  Chest pain set a EXAM: CT CHEST WITHOUT CONTRAST TECHNIQUE: Multidetector CT imaging of the chest was performed following the standard protocol without IV contrast. COMPARISON:  Chest radiograph 03/22/2018 FINDINGS: Cardiovascular: Coronary, aortic arch, and branch vessel atherosclerotic vascular disease. Mild cardiomegaly. Calcification and pseudoaneurysm of the left ventricle indicating prior myocardial infarction. The right ventricular ICD lead is up near the pulmonary valve rather than at the right ventricular apex. However, this seems to be a chronic configuration in this patient. Prior CABG. Severe bilateral degenerative glenohumeral arthropathy. Mediastinum/Nodes: Unremarkable Lungs/Pleura: Moderate-sized loculated right pleural  effusion. Atelectasis of most of the right lower lobe with loculated fluid collections along the fissures. There is some atelectasis in the right middle lobe as well. Trace left pleural effusion with some fluid tracking along the left major fissure. Upper Abdomen: Borderline elevated right hemidiaphragm. Cholecystectomy. Dense atherosclerotic calcification in the celiac artery. Stable rim calcified structure in the right upper abdomen adjacent to the IVC and liver on image 131/3. Musculoskeletal: Thoracic spondylosis. IMPRESSION: 1. Moderate-sized loculated right pleural effusion. Small left pleural effusion. Passive atelectasis in the right lung. 2. Calcification along a pseudoaneurysm of the left ventricle indicating prior myocardial infarction. 3. Chronic borderline malpositioning of the right ventricular ICD lead. Instead of being along the right ventricular apex it is up near the pulmonary valve. 4. Mild cardiomegaly. 5. Borderline elevated right hemidiaphragm. 6. Thoracic spondylosis. 7.  Aortic Atherosclerosis (ICD10-I70.0).  Prior CABG. Electronically Signed   By: Van Clines M.D.   On: 03/22/2018 19:07   Ct Angio Chest Pe W Or Wo Contrast  Result Date: 03/23/2018 CLINICAL DATA:  Chest pain.  Elevated D-dimer. EXAM: CT ANGIOGRAPHY CHEST WITH CONTRAST TECHNIQUE: Multidetector CT imaging of the chest was performed using the standard protocol during bolus administration of intravenous contrast. Multiplanar CT image reconstructions and MIPs were obtained to evaluate the vascular anatomy. CONTRAST:  38m  ISOVUE-370 IOPAMIDOL (ISOVUE-370) INJECTION 76% COMPARISON:  03/22/2018 FINDINGS: Cardiovascular: Pulmonary arterial opacification is good. There are no pulmonary emboli. Heart size is upper limits of normal. Calcified left ventricular aneurysm. There has been previous median sternotomy and CABG. The aorta shows atherosclerosis but no aneurysm or dissection. Mediastinum/Nodes: No mediastinal mass or  lymphadenopathy. Lungs/Pleura: Worsening loculated pleural fluid collections on the right worrisome for empyema. Tiny dot of air or gas within a medial loculation. No pleural fluid on the left. There is compressive atelectasis and or pneumonia the right lung, particularly in the lower lung. There is mild dependent atelectasis or patchy pneumonia at the left base. Upper Abdomen: No acute finding. Chronic benign intraperitoneal calcifications not of acute significance. Musculoskeletal: Bridging osteophytes of the spine with ankylosis. No acute bone finding. Review of the MIP images confirms the above findings. IMPRESSION: Worsening of loculated pleural fluid collections in the right hemithorax worrisome for empyema. Associated compressive atelectasis and or pneumonia of the right lung. Mild patchy atelectasis and or pneumonia at the left base. No sign of pulmonary emboli. Aortic Atherosclerosis (ICD10-I70.0). Electronically Signed   By: Nelson Chimes M.D.   On: 03/23/2018 11:23   I personally reviewed the CT and CXR images and concur with the findings noted above  Review of Systems  Constitutional: Negative for chills, diaphoresis, fever and malaise/fatigue.  Cardiovascular: Positive for chest pain. Negative for orthopnea and leg swelling.   Blood pressure 122/60, pulse 79, temperature 97.9 F (36.6 C), temperature source Oral, resp. rate 18, height '5\' 11"'  (1.803 m), weight 154 lb 6.4 oz (70 kg), SpO2 97 %. Physical Exam  Vitals reviewed. Constitutional: He is oriented to person, place, and time. No distress.  Thin  HENT:  Head: Normocephalic and atraumatic.  Mouth/Throat: No oropharyngeal exudate.  Eyes: Conjunctivae and EOM are normal. No scleral icterus.  Neck: No tracheal deviation present. No thyromegaly present.  Cardiovascular: Normal rate and regular rhythm.  No murmur heard. Respiratory: Effort normal. No respiratory distress. He has no wheezes. He has no rales.  Absent BS right base   GI: Soft. He exhibits no distension. There is no tenderness.  Musculoskeletal: He exhibits no edema.  Lymphadenopathy:    He has no cervical adenopathy.  Neurological: He is alert and oriented to person, place, and time. No cranial nerve deficit. He exhibits normal muscle tone.  Skin: Skin is warm and dry.  Multiple ecchymoses    Assessment/Plan: 83 yo man with a complex medical history who presented with atypical CP. Ruled out for MI and PE. He has been found to have a loculated right pleural effusion. Underlying cause is unknown. He had a cholecystectomy in December and had a small effusion then but on a CXR 1/23 there was no effusion. He could have pneumonia but does not have cough, fever, chills or malaise preceding the events yesterday.   In any event he has a moderate loculated effusion. Options include VATS for decortication and percutaneous drainage with intrapleural thrombolytics. Either is a viable option, but given his age and co-morbidities I would favor a trial of percutaneous drainage with thrombolytics. It is successful 90% of the time with minimal risk of bleeding.   I discussed the options with [name] and his daughter as well as my reasons for favoring a less invasive approach.  If thrombolytics fail we could revisit VATS  Melrose Nakayama 03/23/2018, 6:03 PM

## 2018-03-23 NOTE — Progress Notes (Signed)
    [name] is been found to have a loculated pleural effusion that appears to be consistent with empyema.  Dr. Lake Bells has seem him and recommends VATS as the best way to drain this.  [name] has chronic systolic congestive heart failure but is been well tuned up for many years.  He is not had any CHF exacerbations recently. His echocardiogram in December, 2018 shows a stable left ventricular systolic function with an ejection fraction of 25-30%.  He has not rate 1 diastolic dysfunction.  Based on these findings, I think he would be at low - moderate risk for VATS procedure. Will follow along with you .    Mertie Moores, MD  03/23/2018 4:27 PM    [place],  [place], [place]  [address] Pager [phone] Phone: [phone]; Fax: [phone]

## 2018-03-23 NOTE — Progress Notes (Signed)
ANTICOAGULATION CONSULT NOTE - Initial Consult  Pharmacy Consult:  Heparin Indication:  Rule out VTE  No Known Allergies  Patient Measurements: Height: 5\' 11"  (180.3 cm) Weight: 155 lb (70.3 kg) IBW/kg (Calculated) : 75.3 Heparin Dosing Weight: 70 kg  Vital Signs: BP: 105/52 (03/26 1400) Pulse Rate: 67 (03/26 1400)  Labs: Recent Labs    03/22/18 1051 03/22/18 1829 03/23/18 0520 03/23/18 1235  HGB 12.1*  --  10.9*  --   HCT 37.0*  --  34.1*  --   PLT 244  --  244  --   LABPROT  --  17.0*  --   --   INR  --  1.39  --   --   HEPARINUNFRC  --   --  <0.10* <0.10*  CREATININE 1.45*  --  1.23  --   TROPONINI  --  <0.03 <0.03  --     Estimated Creatinine Clearance: 47.6 mL/min (by C-G formula based on SCr of 1.23 mg/dL).   Medical History: Past Medical History:  Diagnosis Date  . Atrial arrhythmia/a fibrillation    not on coumadin 2/2 subdural hematoma  . Automatic implantable cardiac defibrillator in situ    medtronic  . Automatic implantable cardioverter-defibrillator in situ   . CHF (congestive heart failure) ([place])   . Chronic systolic congestive heart failure ([place])   . Dysrhythmia   . Encounter for long-term (current) use of other medications    amiodarone  . H/O hiatal hernia   . Hyperlipidemia   . Hypertension   . Hypothyroidism   . Ischemic cardiomyopathy    status post CABG with patent vein grafts and an atretic LIMA to his ramus, January 2012  . Myocardial infarction [facility])    1995, non-STEMI January 2012  . Pacemaker   . Subdural hematoma ([facility])    on coumadin  . Thyroid disease    hypothyroid  . Ventricular fibrillation ([place])   . Ventricular tachycardia ([place])     VT Storm-January 2012- responded to Amiodarone      Assessment: 82 YOM presented with chest pain.  Pharmacy consulted to dose IV heparin for rule out VTE in setting of elevated d-dimer.  CT negative for PE, but it was without contrast and provider wants to start heparin through tonight  and MD will re-evaluate in AM.Dr. Acie Fredrickson has ordered CTA for today to r/o PE, but would like to continue heparin infusion for now.  Patient has a history of SDH about 10 years ago and is not on anticoagulation for history of Afib.  Baseline labs reviewed.  No bleeding documented.  Confirmatory heparin: < 0.10, No overt bleeding or infusion issues reported. Will order another lab for later today.  Goal of Therapy:  Heparin level 0.3-0.7 units/ml Monitor platelets by anticoagulation protocol: Yes    Plan:  Increase heparin gtt to 1350 units/hr, no bolus per NP Check 8 hr heparin level Daily heparin level and CBC F/U with [facility] plan in AM

## 2018-03-23 NOTE — Progress Notes (Signed)
Progress Note  Patient Name: [name] Date of Encounter: 03/23/2018  Primary Cardiologist: Mertie Moores, MD   Subjective   83 y.o. male with a history of CAD s/p cath 2012, MI and CABG (9323), chronic systolic HF, ischemic cardiomyopathy, ventricular tachycardia storm (currently controlled on Amiodarone), history of ICD, atrial fibrillation (not on anticoagulation d/t hx of subdural hematoma while on coumadin), HTN, HLD, and hypothyroidism who presented to the [facility] ED via EMS today with complaints of chest pain  His CP is very atypical for ACS.     Troponins remain negative. D-dimer was minimally elevated.   Have ordered a CT angio of the chest   CT of the chest shows a loculated pleural effusion.     Inpatient Medications    Scheduled Meds: . amiodarone  50 mg Oral Daily  . aspirin  324 mg Oral Once  . aspirin EC  81 mg Oral Daily  . B-complex with vitamin C  1 tablet Oral Daily  . carvedilol  6.25 mg Oral BID WC  . donepezil  5 mg Oral Daily  . fenofibrate  160 mg Oral Daily  . finasteride  5 mg Oral Daily  . iopamidol      . levothyroxine  75 mcg Oral QAC breakfast  . lisinopril  5 mg Oral Daily  . multivitamin with minerals  1 tablet Oral Daily  . spironolactone  25 mg Oral Daily  . tamsulosin  0.4 mg Oral QHS   Continuous Infusions: . sodium chloride 50 mL/hr at 03/23/18 0319  . heparin 1,100 Units/hr (03/23/18 0313)   PRN Meds: acetaminophen, ALPRAZolam, methocarbamol, nitroGLYCERIN, ondansetron (ZOFRAN) IV, ondansetron, traMADol   Vital Signs    Vitals:   03/23/18 0530 03/23/18 0915 03/23/18 0930 03/23/18 0938  BP: (!) 105/52 105/61 (!) 110/52 (!) 110/52  Pulse:      Resp: (!) 27 (!) 28 (!) 22   Temp:      TempSrc:      SpO2:      Weight:      Height:        Intake/Output Summary (Last 24 hours) at 03/23/2018 1013 Last data filed at 03/23/2018 0950 Gross per 24 hour  Intake -  Output 350 ml  Net -350 ml   Filed Weights   03/22/18 1038    Weight: 155 lb (70.3 kg)    Telemetry    NSR  - Personally Reviewed  ECG     NSR  - Personally Reviewed  Physical Exam   GEN:  elderly man. No acute distress.   Neck: No JVD Cardiac: RRR, no murmurs, rubs, or gallops.  Respiratory: Clear to auscultation bilaterally. GI: Soft, nontender, non-distended  MS: No edema; No deformity. Neuro:  Nonfocal  Psych: Normal affect   Labs    Chemistry Recent Labs  Lab 03/22/18 1051 03/23/18 0520  NA 138 136  K 4.2 4.0  CL 107 105  CO2 22 19*  GLUCOSE 113* 119*  BUN 22* 18  CREATININE 1.45* 1.23  CALCIUM 8.6* 8.2*  PROT  --  6.1*  ALBUMIN  --  2.4*  AST  --  23  ALT  --  15*  ALKPHOS  --  26*  BILITOT  --  2.2*  GFRNONAA 44* 54*  GFRAA 51* >60  ANIONGAP 9 12     Hematology Recent Labs  Lab 03/22/18 1051 03/23/18 0520  WBC 10.3 8.6  RBC 3.92* 3.61*  HGB 12.1* 10.9*  HCT 37.0* 34.1*  MCV 94.4 94.5  MCH 30.9 30.2  MCHC 32.7 32.0  RDW 15.6* 15.4  PLT 244 244    Cardiac Enzymes Recent Labs  Lab 03/22/18 1829 03/23/18 0520  TROPONINI <0.03 <0.03    Recent Labs  Lab 03/22/18 1056 03/22/18 1401  TROPIPOC 0.00 0.00     BNPNo results for input(s): BNP, PROBNP in the last 168 hours.   DDimer  Recent Labs  Lab 03/22/18 1829  DDIMER 1.72*     Radiology    Dg Chest 2 View  Result Date: 03/22/2018 CLINICAL DATA:  Chest pain radiating across the chest began today. History of previous MI, CHF, CABG. EXAM: CHEST - 2 VIEW COMPARISON:  PA and lateral chest x-ray of January 20, 2018 FINDINGS: There is a moderate-sized right pleural effusion. There is right basilar parenchymal density as well. The left lung is clear. The heart is top-normal in size. The pulmonary vascularity is not engorged. The ICD is in stable position. The sternal wires are intact. There is calcification in the wall of the aortic arch. There is multilevel degenerative disc disease of the thoracic spine. IMPRESSION: New right pleural effusion.  There is likely superimposed right basilar atelectasis or pneumonia. Top-normal cardiac size without overt pulmonary edema. Previous CABG. Thoracic aortic atherosclerosis. Electronically Signed   By: David  Martinique M.D.   On: 03/22/2018 12:26   Ct Chest Wo Contrast  Result Date: 03/22/2018 CLINICAL DATA:  Chest pain set a EXAM: CT CHEST WITHOUT CONTRAST TECHNIQUE: Multidetector CT imaging of the chest was performed following the standard protocol without IV contrast. COMPARISON:  Chest radiograph 03/22/2018 FINDINGS: Cardiovascular: Coronary, aortic arch, and branch vessel atherosclerotic vascular disease. Mild cardiomegaly. Calcification and pseudoaneurysm of the left ventricle indicating prior myocardial infarction. The right ventricular ICD lead is up near the pulmonary valve rather than at the right ventricular apex. However, this seems to be a chronic configuration in this patient. Prior CABG. Severe bilateral degenerative glenohumeral arthropathy. Mediastinum/Nodes: Unremarkable Lungs/Pleura: Moderate-sized loculated right pleural effusion. Atelectasis of most of the right lower lobe with loculated fluid collections along the fissures. There is some atelectasis in the right middle lobe as well. Trace left pleural effusion with some fluid tracking along the left major fissure. Upper Abdomen: Borderline elevated right hemidiaphragm. Cholecystectomy. Dense atherosclerotic calcification in the celiac artery. Stable rim calcified structure in the right upper abdomen adjacent to the IVC and liver on image 131/3. Musculoskeletal: Thoracic spondylosis. IMPRESSION: 1. Moderate-sized loculated right pleural effusion. Small left pleural effusion. Passive atelectasis in the right lung. 2. Calcification along a pseudoaneurysm of the left ventricle indicating prior myocardial infarction. 3. Chronic borderline malpositioning of the right ventricular ICD lead. Instead of being along the right ventricular apex it is up near  the pulmonary valve. 4. Mild cardiomegaly. 5. Borderline elevated right hemidiaphragm. 6. Thoracic spondylosis. 7.  Aortic Atherosclerosis (ICD10-I70.0).  Prior CABG. Electronically Signed   By: Van Clines M.D.   On: 03/22/2018 19:07    Cardiac Studies      Patient Profile     83 y.o. male with CAD and ischemic cardiomyopathy. Admitted with atypical CP . Found to have a moderate sized right pleural effusion   Assessment & Plan    1.  Chest pain :   Very atypical for acute coronary syndrome.  He does have Neri artery disease and has had bypass grafting.  Troponins are negative.  No further evaluation for coronary ischemia at this time.  2.  Right pleural effusion: I consulted  pulmonary medicine to help with this workup.  Effusion appears to be loculated.  For questions or updates, please contact [place] Please consult [email or web address] for contact info under Cardiology/STEMI.      Signed, Mertie Moores, MD  03/23/2018, 10:13 AM

## 2018-03-23 NOTE — Consult Note (Signed)
PULMONARY / CRITICAL CARE MEDICINE   Name: [name] MRN: [id number] DOB: Dec 15, 1935    ADMISSION DATE:  03/22/2018 CONSULTATION DATE: March 23, 2018  REFERRING MD: Dr. Acie Fredrickson  CHIEF COMPLAINT: Chest pain  HISTORY OF PRESENT ILLNESS:   83 year old male with a past medical history significant for coronary artery disease, systolic heart failure, and gallbladder disease requiring a cholecystectomy in December 2018 who returns to our facility with chest pain.  His wife provides most of the history at his request.  She says that he has been stable for the last several weeks.  After his hospitalization in December 2018 he felt fairly weak but had no persistent nausea or vomiting after discharge.  He was discharged on O2, 2 L to be used on exertion which he used briefly.  His appetite improved and his exercise tolerance improved and she says he was back to walking 2-3 miles a day.  He had no respiratory complaint during that time (January February and early March).  No cough, no wheeze, no chest pain, no hemoptysis.  He lost approximately 20 pounds around the time of his cholecystectomy.  However yesterday he developed the sudden onset of a constant chest pain which is dull in character and substernal.  The pain has now resolved.  This has not been associated with fevers or chills.  He was admitted to the cardiology service and the clinical impression is that the pain is not due to ischemia.  A CT scan of his chest was performed yesterday showing a large loculated and complicated pleural effusion on the right.  PAST MEDICAL HISTORY :  He  has a past medical history of Atrial arrhythmia/a fibrillation, Automatic implantable cardiac defibrillator in situ, Automatic implantable cardioverter-defibrillator in situ, CHF (congestive heart failure) ([place]), Chronic systolic congestive heart failure ([place]), Dysrhythmia, Encounter for long-term (current) use of other medications, H/O hiatal hernia, Hyperlipidemia,  Hypertension, Hypothyroidism, Ischemic cardiomyopathy, Myocardial infarction [facility]), Pacemaker, Subdural hematoma ([place]), Thyroid disease, Ventricular fibrillation ([place]), and Ventricular tachycardia ([place]).  PAST SURGICAL HISTORY: He  has a past surgical history that includes bilateral cranitomies for sub hematomas; Bypass Graft (1995); Coronary artery bypass graft (1995); Insert / replace / remove pacemaker (14); Tonsillectomy; Inguinal hernia repair (Left, 05/15/2014); Insertion of mesh (Left, 05/15/2014); implantable cardioverter defibrillator generator change (N/A, 03/28/2013); and Cholecystectomy (N/A, 12/26/2017).  No Known Allergies  No current facility-administered medications on file prior to encounter.    Current Outpatient Medications on File Prior to Encounter  Medication Sig  . amiodarone (PACERONE) 100 MG tablet Take 0.5 tablets (50 mg total) by mouth daily.  [name] Kitchen aspirin EC 81 MG tablet Take 81 mg by mouth daily.  [name] Kitchen b complex vitamins tablet Take 1 tablet by mouth daily.  . carvedilol (COREG) 6.25 MG tablet TAKE 1 TABLET BY MOUTH 2 TIMES DAILY WITH A MEAL. (Patient taking differently: TAKE 1 TABLET (6.25 MG) BY MOUTH 2 TIMES DAILY WITH A MEAL.)  . Coenzyme Q10 (COQ10 PO) Take 1 capsule by mouth daily.  [name] Kitchen donepezil (ARICEPT) 5 MG tablet Take 5 mg by mouth at bedtime.   . fenofibrate 160 MG tablet Take 1 tablet (160 mg total) by mouth daily.  [name] Kitchen levothyroxine (SYNTHROID, LEVOTHROID) 75 MCG tablet Take 1 tablet (75 mcg total) by mouth daily before breakfast.  . lisinopril (PRINIVIL,ZESTRIL) 5 MG tablet Take 1 tablet (5 mg total) by mouth daily. (Patient taking differently: Take 2.5 mg by mouth 2 (two) times daily. )  . LYCOPENE PO Take 1 capsule  by mouth daily.  . Multiple Vitamin (MULTIVITAMIN WITH MINERALS) TABS tablet Take 1 tablet by mouth daily.  . nitroGLYCERIN (NITROSTAT) 0.4 MG SL tablet Place 1 tablet (0.4 mg total) under the tongue every 5 (five) minutes as needed for chest pain.  For chest pain x 3 doses (Patient taking differently: Place 0.4 mg under the tongue every 5 (five) minutes as needed for chest pain (max 3 doses). )  . OVER THE COUNTER MEDICATION Take 1 tablet by mouth daily. "prostrate essentials"  . spironolactone (ALDACTONE) 25 MG tablet Take 1 tablet (25 mg total) by mouth daily.  . Tamsulosin HCl (FLOMAX) 0.4 MG CAPS Take 0.4 mg by mouth at bedtime.   . hydrocortisone (ANUSOL-HC) 2.5 % rectal cream Apply 1 application topically 4 (four) times daily as needed for hemorrhoids. (Patient not taking: Reported on 03/22/2018)  . methocarbamol (ROBAXIN) 500 MG tablet Take 2 tablets (1,000 mg total) by mouth every 6 (six) hours as needed for muscle spasms. (Patient not taking: Reported on 03/22/2018)  . ondansetron (ZOFRAN) 4 MG tablet Take 1 tablet (4 mg total) by mouth every 6 (six) hours as needed for nausea. (Patient not taking: Reported on 03/22/2018)  . traMADol (ULTRAM) 50 MG tablet Take 1 tablet (50 mg total) by mouth every 6 (six) hours as needed for moderate pain. (Patient not taking: Reported on 03/22/2018)    FAMILY HISTORY:  His indicated that his mother is deceased. He indicated that his father is deceased. He indicated that his maternal grandmother is deceased. He indicated that his maternal grandfather is deceased. He indicated that his paternal grandmother is deceased. He indicated that his paternal grandfather is deceased.   SOCIAL HISTORY: He  reports that he has never smoked. He has quit using smokeless tobacco. He reports that he does not drink alcohol or use drugs.  REVIEW OF SYSTEMS:   Gen: Denies fever, chills, weight change, fatigue, night sweats HEENT: Denies blurred vision, double vision, hearing loss, tinnitus, sinus congestion, rhinorrhea, sore throat, neck stiffness, dysphagia PULM: per HPI CV: per HPI otherwise denies edema, orthopnea, paroxysmal nocturnal dyspnea, palpitations GI: Denies abdominal pain, nausea, vomiting, diarrhea,  hematochezia, melena, constipation, change in bowel habits GU: Denies dysuria, hematuria, polyuria, oliguria, urethral discharge Endocrine: Denies hot or cold intolerance, polyuria, polyphagia or appetite change Derm: Denies rash, dry skin, scaling or peeling skin change Heme: Denies easy bruising, bleeding, bleeding gums Neuro: Denies headache, numbness, weakness, slurred speech, loss of memory or consciousness   SUBJECTIVE:  As above  VITAL SIGNS: BP 104/62   Pulse 63   Temp 98.3 F (36.8 C) (Oral)   Resp (!) 24   Ht 5\' 11"  (1.803 m)   Wt 155 lb (70.3 kg)   SpO2 95%   BMI 21.62 kg/m   HEMODYNAMICS:    VENTILATOR SETTINGS:    INTAKE / OUTPUT: I/O last 3 completed shifts: In: -  Out: 200 [Urine:200]  PHYSICAL EXAMINATION:  General:  Healthy appearing 83 y/o male resting comfortably in bed HENT: NCAT OP clear PULM: Diminished R base B, normal effort CV: RRR, no mgr GI: BS+, soft, nontender MSK: normal bulk and tone Neuro: awake, alert, no distress, MAEW   LABS:  BMET Recent Labs  Lab 03/22/18 1051 03/23/18 0520  NA 138 136  K 4.2 4.0  CL 107 105  CO2 22 19*  BUN 22* 18  CREATININE 1.45* 1.23  GLUCOSE 113* 119*    Electrolytes Recent Labs  Lab 03/22/18 1051 03/22/18 1829 03/23/18 0520  CALCIUM 8.6*  --  8.2*  MG  --  1.9  --     CBC Recent Labs  Lab 03/22/18 1051 03/23/18 0520  WBC 10.3 8.6  HGB 12.1* 10.9*  HCT 37.0* 34.1*  PLT 244 244    Coag's Recent Labs  Lab 03/22/18 1829  INR 1.39    Sepsis Markers No results for input(s): LATICACIDVEN, PROCALCITON, O2SATVEN in the last 168 hours.  ABG No results for input(s): PHART, PCO2ART, PO2ART in the last 168 hours.  Liver Enzymes Recent Labs  Lab 03/23/18 0520  AST 23  ALT 15*  ALKPHOS 26*  BILITOT 2.2*  ALBUMIN 2.4*    Cardiac Enzymes Recent Labs  Lab 03/22/18 1829 03/23/18 0520  TROPONINI <0.03 <0.03    Glucose Recent Labs  Lab 03/22/18 1248  GLUCAP 94     Imaging Ct Chest Wo Contrast  Result Date: 03/22/2018 CLINICAL DATA:  Chest pain set a EXAM: CT CHEST WITHOUT CONTRAST TECHNIQUE: Multidetector CT imaging of the chest was performed following the standard protocol without IV contrast. COMPARISON:  Chest radiograph 03/22/2018 FINDINGS: Cardiovascular: Coronary, aortic arch, and branch vessel atherosclerotic vascular disease. Mild cardiomegaly. Calcification and pseudoaneurysm of the left ventricle indicating prior myocardial infarction. The right ventricular ICD lead is up near the pulmonary valve rather than at the right ventricular apex. However, this seems to be a chronic configuration in this patient. Prior CABG. Severe bilateral degenerative glenohumeral arthropathy. Mediastinum/Nodes: Unremarkable Lungs/Pleura: Moderate-sized loculated right pleural effusion. Atelectasis of most of the right lower lobe with loculated fluid collections along the fissures. There is some atelectasis in the right middle lobe as well. Trace left pleural effusion with some fluid tracking along the left major fissure. Upper Abdomen: Borderline elevated right hemidiaphragm. Cholecystectomy. Dense atherosclerotic calcification in the celiac artery. Stable rim calcified structure in the right upper abdomen adjacent to the IVC and liver on image 131/3. Musculoskeletal: Thoracic spondylosis. IMPRESSION: 1. Moderate-sized loculated right pleural effusion. Small left pleural effusion. Passive atelectasis in the right lung. 2. Calcification along a pseudoaneurysm of the left ventricle indicating prior myocardial infarction. 3. Chronic borderline malpositioning of the right ventricular ICD lead. Instead of being along the right ventricular apex it is up near the pulmonary valve. 4. Mild cardiomegaly. 5. Borderline elevated right hemidiaphragm. 6. Thoracic spondylosis. 7.  Aortic Atherosclerosis (ICD10-I70.0).  Prior CABG. Electronically Signed   By: Van Clines M.D.   On:  03/22/2018 19:07   Ct Angio Chest Pe W Or Wo Contrast  Result Date: 03/23/2018 CLINICAL DATA:  Chest pain.  Elevated D-dimer. EXAM: CT ANGIOGRAPHY CHEST WITH CONTRAST TECHNIQUE: Multidetector CT imaging of the chest was performed using the standard protocol during bolus administration of intravenous contrast. Multiplanar CT image reconstructions and MIPs were obtained to evaluate the vascular anatomy. CONTRAST:  32mL ISOVUE-370 IOPAMIDOL (ISOVUE-370) INJECTION 76% COMPARISON:  03/22/2018 FINDINGS: Cardiovascular: Pulmonary arterial opacification is good. There are no pulmonary emboli. Heart size is upper limits of normal. Calcified left ventricular aneurysm. There has been previous median sternotomy and CABG. The aorta shows atherosclerosis but no aneurysm or dissection. Mediastinum/Nodes: No mediastinal mass or lymphadenopathy. Lungs/Pleura: Worsening loculated pleural fluid collections on the right worrisome for empyema. Tiny dot of air or gas within a medial loculation. No pleural fluid on the left. There is compressive atelectasis and or pneumonia the right lung, particularly in the lower lung. There is mild dependent atelectasis or patchy pneumonia at the left base. Upper Abdomen: No acute finding. Chronic benign intraperitoneal calcifications not  of acute significance. Musculoskeletal: Bridging osteophytes of the spine with ankylosis. No acute bone finding. Review of the MIP images confirms the above findings. IMPRESSION: Worsening of loculated pleural fluid collections in the right hemithorax worrisome for empyema. Associated compressive atelectasis and or pneumonia of the right lung. Mild patchy atelectasis and or pneumonia at the left base. No sign of pulmonary emboli. Aortic Atherosclerosis (ICD10-I70.0). Electronically Signed   By: Nelson Chimes M.D.   On: 03/23/2018 11:23     STUDIES:  CT angiogram chest March 22, 2018 images independently reviewed showing a loculated and complicated appearing  pleural effusion on the right.,  No PE  CULTURES: None  ANTIBIOTICS: None  SIGNIFICANT EVENTS:   LINES/TUBES:   DISCUSSION: 83 year old male with ischemic cardiomyopathy who has a complicated pleural effusion on the right causing chest pain.  The most likely explanation for this effusion is that it is an empyema somehow related to the cholecystectomy he had in December.  Differential diagnosis includes silent aspiration.  ASSESSMENT / PLAN:  PULMONARY A: Complicated and loculated pleural effusion, likely empyema P:   Given the appearance on CT and the complex nature I think it is best to discuss consideration of thorascopic drainage with thoracic surgery.  However, if they feel that his surgical risk is too high we could attempt a percutaneous approach with instillation of Pulmozyme and TPA.   I will consult thoracic surgery Will hold off on antibiotics for now in anticipation of obtaining cultures  CARDIOVASCULAR A:  Chest pain> doubt ischemia Ischemic cardiomyopathy P:  Tele Could probably stop heparin, will defer to cardiology   Roselie Awkward, MD [place] [facility] Pager: [phone] Cell: [phone] After 3pm or if no response, call [phone]  03/23/2018, 2:44 PM

## 2018-03-23 NOTE — Progress Notes (Addendum)
ANTICOAGULATION CONSULT NOTE - Initial Consult  Pharmacy Consult:  Heparin Indication:  Rule out VTE  No Known Allergies  Patient Measurements: Height: 5\' 11"  (180.3 cm) Weight: 155 lb (70.3 kg) IBW/kg (Calculated) : 75.3 Heparin Dosing Weight: 70 kg  Vital Signs: BP: 105/52 (03/26 0530) Pulse Rate: 78 (03/26 0315)  Labs: Recent Labs    03/22/18 1051 03/22/18 1829 03/23/18 0520  HGB 12.1*  --  10.9*  HCT 37.0*  --  34.1*  PLT 244  --  244  LABPROT  --  17.0*  --   INR  --  1.39  --   HEPARINUNFRC  --   --  <0.10*  CREATININE 1.45*  --  1.23  TROPONINI  --  <0.03 <0.03    Estimated Creatinine Clearance: 47.6 mL/min (by C-G formula based on SCr of 1.23 mg/dL).   Medical History: Past Medical History:  Diagnosis Date  . Atrial arrhythmia/a fibrillation    not on coumadin 2/2 subdural hematoma  . Automatic implantable cardiac defibrillator in situ    medtronic  . Automatic implantable cardioverter-defibrillator in situ   . CHF (congestive heart failure) ([place])   . Chronic systolic congestive heart failure ([place])   . Dysrhythmia   . Encounter for long-term (current) use of other medications    amiodarone  . H/O hiatal hernia   . Hyperlipidemia   . Hypertension   . Hypothyroidism   . Ischemic cardiomyopathy    status post CABG with patent vein grafts and an atretic LIMA to his ramus, January 2012  . Myocardial infarction [facility])    1995, non-STEMI January 2012  . Pacemaker   . Subdural hematoma ([facility])    on coumadin  . Thyroid disease    hypothyroid  . Ventricular fibrillation ([place])   . Ventricular tachycardia ([place])     VT Storm-January 2012- responded to Amiodarone      Assessment: 82 YOM presented with chest pain.  Pharmacy consulted to dose IV heparin for rule out VTE in setting of elevated d-dimer.  CT negative for PE, but it was without contrast and provider wants to start heparin through tonight and MD will re-evaluate in AM.Dr. Acie Fredrickson has ordered CTA  for today to r/o PE. Level this morning <0.1 but only ~ 2 hours after infusion started. No overt bleeding or infusion issues reported. Will order another lab for later today.  Patient has a history of SDH about 10 years ago and is not on anticoagulation for history of Afib.  Baseline labs reviewed.  No bleeding documented.   Goal of Therapy:  Heparin level 0.3-0.7 units/ml Monitor platelets by anticoagulation protocol: Yes    Plan:  Continue heparin gtt at 1100 units/hr, no bolus per NP Check 8 hr heparin level Daily heparin level and CBC F/U with [facility] plan in AM

## 2018-03-24 ENCOUNTER — Other Ambulatory Visit: Payer: Self-pay

## 2018-03-24 ENCOUNTER — Encounter (HOSPITAL_COMMUNITY): Payer: Self-pay | Admitting: Radiology

## 2018-03-24 DIAGNOSIS — J90 Pleural effusion, not elsewhere classified: Secondary | ICD-10-CM | POA: Diagnosis present

## 2018-03-24 MED ORDER — DONEPEZIL HCL 5 MG PO TABS
5.0000 mg | ORAL_TABLET | Freq: Every day | ORAL | Status: DC
  Administered 2018-03-24 – 2018-03-30 (×7): 5 mg via ORAL
  Filled 2018-03-24 (×7): qty 1

## 2018-03-24 NOTE — Plan of Care (Signed)
  Problem: Coping: Goal: Level of anxiety will decrease Outcome: Completed/Met  Patient denies anxiety. Displays no signs or symptoms of distress. 

## 2018-03-24 NOTE — Progress Notes (Signed)
Chief Complaint: Patient was seen in consultation today for right empyema/effusion at the request of Dr. Londell Moh  Referring Physician(s): Dr. Londell Moh  Supervising Physician: Marybelle Killings  Patient Status: [facility] - In-pt  History of Present Illness: [name] is a 83 y.o. male admitted with chest discomfort. He does have cardiac hx but is found to have complex/loculated right pleural effusion. CTS and PCCM have consulted and feel best approach is percutaneous drainage with possible intrapleural thrombolytics. IR is asked to place pigtail drainage catheter. Chart, imaging, meds, labs, and allergies reviewed. Pt seen with family at bedside. He is resting without specific complaints.      Past Medical History:  Diagnosis Date  . Atrial arrhythmia/a fibrillation    not on coumadin 2/2 subdural hematoma  . Automatic implantable cardiac defibrillator in situ    medtronic  . Automatic implantable cardioverter-defibrillator in situ   . CHF (congestive heart failure) ([place])   . Chronic systolic congestive heart failure ([place])   . Dysrhythmia   . Encounter for long-term (current) use of other medications    amiodarone  . H/O hiatal hernia   . Hyperlipidemia   . Hypertension   . Hypothyroidism   . Ischemic cardiomyopathy    status post CABG with patent vein grafts and an atretic LIMA to his ramus, January 2012  . Myocardial infarction [facility])    1995, non-STEMI January 2012  . Pacemaker   . Subdural hematoma (H[facility])    on coumadin  . Thyroid disease    hypothyroid  . Ventricular fibrillation ([place])   . Ventricular tachycardia ([place])     VT [place]-January 2012- responded to Amiodarone    Past Surgical History:  Procedure Laterality Date  . bilateral cranitomies for sub hematomas    . BYPASS GRAFT  1995   5  . CHOLECYSTECTOMY N/A 12/26/2017   Procedure: LAPAROSCOPIC CHOLECYSTECTOMY;  Surgeon: Ileana Roup, MD;  Location: [place];  Service: General;  Laterality:  N/A;  . CORONARY ARTERY BYPASS GRAFT  1995   5  . IMPLANTABLE CARDIOVERTER DEFIBRILLATOR GENERATOR CHANGE N/A 03/28/2013   Procedure: IMPLANTABLE CARDIOVERTER DEFIBRILLATOR GENERATOR CHANGE;  Surgeon: Deboraha Sprang, MD;  Location: [facility] CATH LAB;  Service: Cardiovascular;  Laterality: N/A;  . INGUINAL HERNIA REPAIR Left 05/15/2014   Procedure: HERNIA REPAIR INGUINAL ADULT;  Surgeon: Zenovia Jarred, MD;  Location: [place];  Service: General;  Laterality: Left;  . INSERT / REPLACE / REMOVE PACEMAKER  14    generator repl   . INSERTION OF MESH Left 05/15/2014   Procedure: INSERTION OF MESH;  Surgeon: Zenovia Jarred, MD;  Location: [place];  Service: General;  Laterality: Left;  . TONSILLECTOMY      Allergies: Patient has no known allergies.  Medications:  Current Facility-Administered Medications:  .  acetaminophen (TYLENOL) tablet 650 mg, 650 mg, Oral, Q4H PRN, Isaiah Serge, NP .  ALPRAZolam Duanne Moron) tablet 0.25 mg, 0.25 mg, Oral, BID PRN, Isaiah Serge, NP .  amiodarone (PACERONE) tablet 50 mg, 50 mg, Oral, Daily, Cecilie Kicks R, NP, 50 mg at 03/24/18 1237 .  aspirin EC tablet 81 mg, 81 mg, Oral, Daily, Isaiah Serge, NP, 81 mg at 03/24/18 1055 .  B-complex with vitamin C tablet 1 tablet, 1 tablet, Oral, Daily, Isaiah Serge, NP, 1 tablet at 03/24/18 1058 .  carvedilol (COREG) tablet 6.25 mg, 6.25 mg, Oral, BID WC, Isaiah Serge, NP, 6.25 mg at 03/24/18 0757 .  donepezil (  ARICEPT) tablet 5 mg, 5 mg, Oral, QHS, Nahser, Wonda Cheng, MD .  fenofibrate tablet 160 mg, 160 mg, Oral, Daily, Isaiah Serge, NP, 160 mg at 03/24/18 1057 .  finasteride (PROSCAR) tablet 5 mg, 5 mg, Oral, Daily, Cecilie Kicks R, NP, 5 mg at 03/24/18 1058 .  levothyroxine (SYNTHROID, LEVOTHROID) tablet 75 mcg, 75 mcg, Oral, QAC breakfast, Isaiah Serge, NP, 75 mcg at 03/24/18 0757 .  lisinopril (PRINIVIL,ZESTRIL) tablet 5 mg, 5 mg, Oral, Daily, Cecilie Kicks R, NP, 5 mg at 03/24/18 1056 .  methocarbamol  (ROBAXIN) tablet 1,000 mg, 1,000 mg, Oral, Q6H PRN, Isaiah Serge, NP .  multivitamin with minerals tablet 1 tablet, 1 tablet, Oral, Daily, Isaiah Serge, NP, 1 tablet at 03/24/18 1057 .  nitroGLYCERIN (NITROSTAT) SL tablet 0.4 mg, 0.4 mg, Sublingual, Q5 Min x 3 PRN, Isaiah Serge, NP .  ondansetron [facility]) injection 4 mg, 4 mg, Intravenous, Q6H PRN, Isaiah Serge, NP .  ondansetron [facility]) tablet 4 mg, 4 mg, Oral, Q6H PRN, Isaiah Serge, NP .  spironolactone (ALDACTONE) tablet 25 mg, 25 mg, Oral, Daily, Cecilie Kicks R, NP, 25 mg at 03/24/18 1057 .  tamsulosin (FLOMAX) capsule 0.4 mg, 0.4 mg, Oral, QHS, Isaiah Serge, NP, 0.4 mg at 03/23/18 2148 .  traMADol (ULTRAM) tablet 50 mg, 50 mg, Oral, Q6H PRN, Isaiah Serge, NP    Family History  Problem Relation Age of Onset  . Cerebral aneurysm Father   . Hypertension Father     Social History   Socioeconomic History  . Marital status: Married    Spouse name: Not on file  . Number of children: Not on file  . Years of education: Not on file  . Highest education level: Not on file  Occupational History  . Not on file  Social Needs  . Financial resource strain: Not on file  . Food insecurity:    Worry: Not on file    Inability: Not on file  . Transportation needs:    Medical: Not on file    Non-medical: Not on file  Tobacco Use  . Smoking status: Never Smoker  . Smokeless tobacco: Former Network engineer and Sexual Activity  . Alcohol use: No  . Drug use: No  . Sexual activity: Not on file  Lifestyle  . Physical activity:    Days per week: Not on file    Minutes per session: Not on file  . Stress: Not on file  Relationships  . Social connections:    Talks on phone: Not on file    Gets together: Not on file    Attends religious service: Not on file    Active member of club or organization: Not on file    Attends meetings of clubs or organizations: Not on file    Relationship status: Not on file  Other Topics  Concern  . Not on file  Social History Narrative  . Not on file     Review of Systems: A 12 point ROS discussed and pertinent positives are indicated in the HPI above.  All other systems are negative.  Review of Systems  Vital Signs: BP 120/72 (BP Location: Right Arm)   Pulse 85   Temp 98.9 F (37.2 C) (Oral)   Resp 17   Ht 5\' 11"  (1.803 m)   Wt 154 lb 4.8 oz (70 kg)   SpO2 98%   BMI 21.52 kg/m   Physical Exam  Constitutional: He  appears well-developed. No distress.  HENT:  Head: Normocephalic.  Mouth/Throat: Oropharynx is clear and moist.  Neck: Normal range of motion. No JVD present. No tracheal deviation present.  Cardiovascular: Normal rate, regular rhythm and normal heart sounds.  Pulmonary/Chest: Effort normal. No respiratory distress.  Diminished right BS  Skin: Skin is warm and dry.  Psychiatric: He has a normal mood and affect.     Imaging: Dg Chest 2 View  Result Date: 03/22/2018 CLINICAL DATA:  Chest pain radiating across the chest began today. History of previous MI, CHF, CABG. EXAM: CHEST - 2 VIEW COMPARISON:  PA and lateral chest x-ray of January 20, 2018 FINDINGS: There is a moderate-sized right pleural effusion. There is right basilar parenchymal density as well. The left lung is clear. The heart is top-normal in size. The pulmonary vascularity is not engorged. The ICD is in stable position. The sternal wires are intact. There is calcification in the wall of the aortic arch. There is multilevel degenerative disc disease of the thoracic spine. IMPRESSION: New right pleural effusion. There is likely superimposed right basilar atelectasis or pneumonia. Top-normal cardiac size without overt pulmonary edema. Previous CABG. Thoracic aortic atherosclerosis. Electronically Signed   By: David  Martinique M.D.   On: 03/22/2018 12:26   Ct Chest Wo Contrast  Result Date: 03/22/2018 CLINICAL DATA:  Chest pain set a EXAM: CT CHEST WITHOUT CONTRAST TECHNIQUE: Multidetector CT  imaging of the chest was performed following the standard protocol without IV contrast. COMPARISON:  Chest radiograph 03/22/2018 FINDINGS: Cardiovascular: Coronary, aortic arch, and branch vessel atherosclerotic vascular disease. Mild cardiomegaly. Calcification and pseudoaneurysm of the left ventricle indicating prior myocardial infarction. The right ventricular ICD lead is up near the pulmonary valve rather than at the right ventricular apex. However, this seems to be a chronic configuration in this patient. Prior CABG. Severe bilateral degenerative glenohumeral arthropathy. Mediastinum/Nodes: Unremarkable Lungs/Pleura: Moderate-sized loculated right pleural effusion. Atelectasis of most of the right lower lobe with loculated fluid collections along the fissures. There is some atelectasis in the right middle lobe as well. Trace left pleural effusion with some fluid tracking along the left major fissure. Upper Abdomen: Borderline elevated right hemidiaphragm. Cholecystectomy. Dense atherosclerotic calcification in the celiac artery. Stable rim calcified structure in the right upper abdomen adjacent to the IVC and liver on image 131/3. Musculoskeletal: Thoracic spondylosis. IMPRESSION: 1. Moderate-sized loculated right pleural effusion. Small left pleural effusion. Passive atelectasis in the right lung. 2. Calcification along a pseudoaneurysm of the left ventricle indicating prior myocardial infarction. 3. Chronic borderline malpositioning of the right ventricular ICD lead. Instead of being along the right ventricular apex it is up near the pulmonary valve. 4. Mild cardiomegaly. 5. Borderline elevated right hemidiaphragm. 6. Thoracic spondylosis. 7.  Aortic Atherosclerosis (ICD10-I70.0).  Prior CABG. Electronically Signed   By: Van Clines M.D.   On: 03/22/2018 19:07   Ct Angio Chest Pe W Or Wo Contrast  Result Date: 03/23/2018 CLINICAL DATA:  Chest pain.  Elevated D-dimer. EXAM: CT ANGIOGRAPHY CHEST WITH  CONTRAST TECHNIQUE: Multidetector CT imaging of the chest was performed using the standard protocol during bolus administration of intravenous contrast. Multiplanar CT image reconstructions and MIPs were obtained to evaluate the vascular anatomy. CONTRAST:  62mL ISOVUE-370 IOPAMIDOL (ISOVUE-370) INJECTION 76% COMPARISON:  03/22/2018 FINDINGS: Cardiovascular: Pulmonary arterial opacification is good. There are no pulmonary emboli. Heart size is upper limits of normal. Calcified left ventricular aneurysm. There has been previous median sternotomy and CABG. The aorta shows atherosclerosis but no aneurysm  or dissection. Mediastinum/Nodes: No mediastinal mass or lymphadenopathy. Lungs/Pleura: Worsening loculated pleural fluid collections on the right worrisome for empyema. Tiny dot of air or gas within a medial loculation. No pleural fluid on the left. There is compressive atelectasis and or pneumonia the right lung, particularly in the lower lung. There is mild dependent atelectasis or patchy pneumonia at the left base. Upper Abdomen: No acute finding. Chronic benign intraperitoneal calcifications not of acute significance. Musculoskeletal: Bridging osteophytes of the spine with ankylosis. No acute bone finding. Review of the MIP images confirms the above findings. IMPRESSION: Worsening of loculated pleural fluid collections in the right hemithorax worrisome for empyema. Associated compressive atelectasis and or pneumonia of the right lung. Mild patchy atelectasis and or pneumonia at the left base. No sign of pulmonary emboli. Aortic Atherosclerosis (ICD10-I70.0). Electronically Signed   By: Nelson Chimes M.D.   On: 03/23/2018 11:23    Labs:  CBC: Recent Labs    12/25/17 0538 12/27/17 0934 03/22/18 1051 03/23/18 0520  WBC 18.5* 13.7* 10.3 8.6  HGB 14.2 13.5 12.1* 10.9*  HCT 42.4 40.2 37.0* 34.1*  PLT 149* 214 244 244    COAGS: Recent Labs    03/22/18 1829  INR 1.39    BMP: Recent Labs     12/28/17 1332 02/08/18 0828 03/22/18 1051 03/23/18 0520  NA 134* 135 138 136  K 3.6 4.7 4.2 4.0  CL 104 100 107 105  CO2 23 23 22  19*  GLUCOSE 142* 94 113* 119*  BUN 18 16 22* 18  CALCIUM 8.0* 8.8 8.6* 8.2*  CREATININE 1.07 1.11 1.45* 1.23  GFRNONAA >60 62 44* 54*  GFRAA >60 72 51* >60    LIVER FUNCTION TESTS: Recent Labs    12/24/17 0613 12/25/17 0538 02/08/18 0828 03/23/18 0520  BILITOT 1.5* 2.0* 0.5 2.2*  AST 25 28 23 23   ALT 21 23 18  15*  ALKPHOS 24* 29* 28* 26*  PROT 5.5* 5.7* 6.0 6.1*  ALBUMIN 2.5* 2.5* 3.4* 2.4*    TUMOR MARKERS: No results for input(s): AFPTM, CEA, CA199, CHROMGRNA in the last 8760 hours.  Assessment and Plan: Complex/loculated right pleural effusion, possible empyema. Case and imaging reviewed with Dr. Barbie Banner, ok for right chest tube placement Pt will be NPO p MN for procedure tomorrow. Labs reviewed. Risks and benefits discussed with the patient including bleeding, infection, damage to adjacent structures, pneumothorax, and sepsis.  All of the patient's questions were answered, patient is agreeable to proceed. Consent signed and in chart.    Thank you for this interesting consult.  I greatly enjoyed meeting GUILFORD SHANNAHAN and look forward to participating in their care.  A copy of this report was sent to the requesting provider on this date.  Electronically Signed: Ascencion Dike, PA-C 03/24/2018, 2:05 PM   I spent a total of 20 minutes in face to face in clinical consultation, greater than 50% of which was counseling/coordinating care for chest tube placement to right effusion

## 2018-03-24 NOTE — Plan of Care (Signed)
  Problem: Activity: Goal: Risk for activity intolerance will decrease Outcome: Completed/Met  Pt ambulates independently with steady gait.

## 2018-03-24 NOTE — Progress Notes (Addendum)
Spoke to [employer] PA Byron regarding CT percutaneous Pleural Drain scheduling per pt and family request. Informed that procedure will  Be done tomorrow, and that patient allowed to eat lunch that has been held since procedure ordered. Leveda Anna, BSN, RN

## 2018-03-24 NOTE — Progress Notes (Signed)
PULMONARY / CRITICAL CARE MEDICINE   Name: [name] MRN: [id number] DOB: 22-Oct-1935    ADMISSION DATE:  03/22/2018 CONSULTATION DATE: March 23, 2018  REFERRING MD: Dr. Acie Fredrickson  CHIEF COMPLAINT: Chest pain  HISTORY OF PRESENT ILLNESS:   83 year old male with a past medical history significant for coronary artery disease, systolic heart failure, and gallbladder disease requiring a cholecystectomy in December 2018 who returns to our facility with chest pain.  His wife provides most of the history at his request.  She says that he has been stable for the last several weeks.  After his hospitalization in December 2018 he felt fairly weak but had no persistent nausea or vomiting after discharge.  He was discharged on O2, 2 L to be used on exertion which he used briefly.  His appetite improved and his exercise tolerance improved and she says he was back to walking 2-3 miles a day.  He had no respiratory complaint during that time (January February and early March).  No cough, no wheeze, no chest pain, no hemoptysis.  He lost approximately 20 pounds around the time of his cholecystectomy.  However yesterday he developed the sudden onset of a constant chest pain which is dull in character and substernal.  The pain has now resolved.  This has not been associated with fevers or chills.  He was admitted to the cardiology service and the clinical impression is that the pain is not due to ischemia.  A CT scan of his chest was performed yesterday showing a large loculated and complicated pleural effusion on the right.   SUBJECTIVE:  No acute events.  IR planning for pigtail chest tube tomorrow 3/28.  VITAL SIGNS: BP 120/72 (BP Location: Right Arm)   Pulse 85   Temp 98.9 F (37.2 C) (Oral)   Resp 17   Ht 5\' 11"  (1.803 m)   Wt 70 kg (154 lb 4.8 oz)   SpO2 98%   BMI 21.52 kg/m   HEMODYNAMICS:    VENTILATOR SETTINGS:    INTAKE / OUTPUT: I/O last 3 completed shifts: In: 1273.3 [P.O.:810;  I.V.:463.3] Out: 950 [Urine:950]  PHYSICAL EXAMINATION:  General:  Healthy appearing 83 y/o male resting comfortably in bed, in NAD HENT: NCAT OP clear PULM: Diminished R base, normal effort CV: RRR, no mgr GI: BS+, soft, nontender MSK: normal bulk and tone Neuro: awake, alert, no distress, MAE   LABS:  BMET Recent Labs  Lab 03/22/18 1051 03/23/18 0520  NA 138 136  K 4.2 4.0  CL 107 105  CO2 22 19*  BUN 22* 18  CREATININE 1.45* 1.23  GLUCOSE 113* 119*    Electrolytes Recent Labs  Lab 03/22/18 1051 03/22/18 1829 03/23/18 0520  CALCIUM 8.6*  --  8.2*  MG  --  1.9  --     CBC Recent Labs  Lab 03/22/18 1051 03/23/18 0520  WBC 10.3 8.6  HGB 12.1* 10.9*  HCT 37.0* 34.1*  PLT 244 244    Coag's Recent Labs  Lab 03/22/18 1829  INR 1.39    Sepsis Markers No results for input(s): LATICACIDVEN, PROCALCITON, O2SATVEN in the last 168 hours.  ABG No results for input(s): PHART, PCO2ART, PO2ART in the last 168 hours.  Liver Enzymes Recent Labs  Lab 03/23/18 0520  AST 23  ALT 15*  ALKPHOS 26*  BILITOT 2.2*  ALBUMIN 2.4*    Cardiac Enzymes Recent Labs  Lab 03/22/18 1829 03/23/18 0520  TROPONINI <0.03 <0.03    Glucose Recent  Labs  Lab 03/22/18 1248  GLUCAP 94    Imaging No results found.   STUDIES:  CT angiogram chest March 22, 2018 images independently reviewed showing a loculated and complicated appearing pleural effusion on the right.,  No PE  CULTURES: None  ANTIBIOTICS: None  SIGNIFICANT EVENTS:   LINES/TUBES:   DISCUSSION: 83 year old male with ischemic cardiomyopathy who has a complicated pleural effusion on the right causing chest pain.  The most likely explanation for this effusion is that it is an empyema somehow related to the cholecystectomy he had in December.  Differential diagnosis includes silent aspiration.  ASSESSMENT / PLAN:  Complicated and loculated pleural effusion, likely empyema P:   IR planning  for pigtail chest tube tomorrow 3/28. Depending on output, might need intrapleural thrombolytics. If above is unsuccessful, CVTS to follow and will consider options for VATS with decortication (but hopefully can avoid).  Obtain cultures after chest tube to help guide abx / further therapy / etc.  Chest pain> doubt ischemia Ischemic cardiomyopathy P:  Cardiology following.   Rest per primary team.   Montey Hora, [place] [facility] Pager: [phone]  or [phone] 03/24/2018, 1:58 PM

## 2018-03-24 NOTE — Progress Notes (Addendum)
Progress Note  Patient Name: [name] Date of Encounter: 03/24/2018  Primary Cardiologist: Mertie Moores, MD   Subjective   Flat in bed, NAD  Inpatient Medications    Scheduled Meds: . amiodarone  50 mg Oral Daily  . aspirin EC  81 mg Oral Daily  . B-complex with vitamin C  1 tablet Oral Daily  . carvedilol  6.25 mg Oral BID WC  . donepezil  5 mg Oral Daily  . fenofibrate  160 mg Oral Daily  . finasteride  5 mg Oral Daily  . levothyroxine  75 mcg Oral QAC breakfast  . lisinopril  5 mg Oral Daily  . multivitamin with minerals  1 tablet Oral Daily  . spironolactone  25 mg Oral Daily  . tamsulosin  0.4 mg Oral QHS   Continuous Infusions:  PRN Meds: acetaminophen, ALPRAZolam, methocarbamol, nitroGLYCERIN, ondansetron (ZOFRAN) IV, ondansetron, traMADol   Vital Signs    Vitals:   03/23/18 1500 03/23/18 1537 03/23/18 1941 03/24/18 0659  BP:  122/60 (!) 109/57 120/72  Pulse: 69 79 73 85  Resp: (!) 25 18 17 17   Temp:  97.9 F (36.6 C) 100.2 F (37.9 C) 98.9 F (37.2 C)  TempSrc:  Oral Oral Oral  SpO2: 94% 97% 98% 98%  Weight:  154 lb 6.4 oz (70 kg)  154 lb 4.8 oz (70 kg)  Height:  5\' 11"  (1.803 m)      Intake/Output Summary (Last 24 hours) at 03/24/2018 0758 Last data filed at 03/24/2018 0700 Gross per 24 hour  Intake 1273.33 ml  Output 750 ml  Net 523.33 ml   Filed Weights   03/22/18 1038 03/23/18 1537 03/24/18 0659  Weight: 155 lb (70.3 kg) 154 lb 6.4 oz (70 kg) 154 lb 4.8 oz (70 kg)    Telemetry    NSR, PVCs - Personally Reviewed  ECG    03/22/18-NSR, RBBB - Personally Reviewed  Physical Exam   GEN: No acute distress.   Neck: No JVD Cardiac: RRR, no murmurs, rubs, or gallops.  Respiratory: decreased breath sounds and dullness to percussion on Rt GI: Soft, nontender, non-distended  MS: No edema; No deformity. Neuro:  Nonfocal  Psych: Normal affect   Labs    Chemistry Recent Labs  Lab 03/22/18 1051 03/23/18 0520  NA 138 136  K  4.2 4.0  CL 107 105  CO2 22 19*  GLUCOSE 113* 119*  BUN 22* 18  CREATININE 1.45* 1.23  CALCIUM 8.6* 8.2*  PROT  --  6.1*  ALBUMIN  --  2.4*  AST  --  23  ALT  --  15*  ALKPHOS  --  26*  BILITOT  --  2.2*  GFRNONAA 44* 54*  GFRAA 51* >60  ANIONGAP 9 12     Hematology Recent Labs  Lab 03/22/18 1051 03/23/18 0520  WBC 10.3 8.6  RBC 3.92* 3.61*  HGB 12.1* 10.9*  HCT 37.0* 34.1*  MCV 94.4 94.5  MCH 30.9 30.2  MCHC 32.7 32.0  RDW 15.6* 15.4  PLT 244 244    Cardiac Enzymes Recent Labs  Lab 03/22/18 1829 03/23/18 0520  TROPONINI <0.03 <0.03    Recent Labs  Lab 03/22/18 1056 03/22/18 1401  TROPIPOC 0.00 0.00     BNPNo results for input(s): BNP, PROBNP in the last 168 hours.   DDimer  Recent Labs  Lab 03/22/18 1829  DDIMER 1.72*     Radiology    Dg Chest 2 View  Result Date:  03/22/2018 CLINICAL DATA:  Chest pain radiating across the chest began today. History of previous MI, CHF, CABG. EXAM: CHEST - 2 VIEW COMPARISON:  PA and lateral chest x-ray of January 20, 2018 FINDINGS: There is a moderate-sized right pleural effusion. There is right basilar parenchymal density as well. The left lung is clear. The heart is top-normal in size. The pulmonary vascularity is not engorged. The ICD is in stable position. The sternal wires are intact. There is calcification in the wall of the aortic arch. There is multilevel degenerative disc disease of the thoracic spine. IMPRESSION: New right pleural effusion. There is likely superimposed right basilar atelectasis or pneumonia. Top-normal cardiac size without overt pulmonary edema. Previous CABG. Thoracic aortic atherosclerosis. Electronically Signed   By: David  Martinique M.D.   On: 03/22/2018 12:26   Ct Chest Wo Contrast  Result Date: 03/22/2018 CLINICAL DATA:  Chest pain set a EXAM: CT CHEST WITHOUT CONTRAST TECHNIQUE: Multidetector CT imaging of the chest was performed following the standard protocol without IV contrast.  COMPARISON:  Chest radiograph 03/22/2018 FINDINGS: Cardiovascular: Coronary, aortic arch, and branch vessel atherosclerotic vascular disease. Mild cardiomegaly. Calcification and pseudoaneurysm of the left ventricle indicating prior myocardial infarction. The right ventricular ICD lead is up near the pulmonary valve rather than at the right ventricular apex. However, this seems to be a chronic configuration in this patient. Prior CABG. Severe bilateral degenerative glenohumeral arthropathy. Mediastinum/Nodes: Unremarkable Lungs/Pleura: Moderate-sized loculated right pleural effusion. Atelectasis of most of the right lower lobe with loculated fluid collections along the fissures. There is some atelectasis in the right middle lobe as well. Trace left pleural effusion with some fluid tracking along the left major fissure. Upper Abdomen: Borderline elevated right hemidiaphragm. Cholecystectomy. Dense atherosclerotic calcification in the celiac artery. Stable rim calcified structure in the right upper abdomen adjacent to the IVC and liver on image 131/3. Musculoskeletal: Thoracic spondylosis. IMPRESSION: 1. Moderate-sized loculated right pleural effusion. Small left pleural effusion. Passive atelectasis in the right lung. 2. Calcification along a pseudoaneurysm of the left ventricle indicating prior myocardial infarction. 3. Chronic borderline malpositioning of the right ventricular ICD lead. Instead of being along the right ventricular apex it is up near the pulmonary valve. 4. Mild cardiomegaly. 5. Borderline elevated right hemidiaphragm. 6. Thoracic spondylosis. 7.  Aortic Atherosclerosis (ICD10-I70.0).  Prior CABG. Electronically Signed   By: Van Clines M.D.   On: 03/22/2018 19:07   Ct Angio Chest Pe W Or Wo Contrast  Result Date: 03/23/2018 CLINICAL DATA:  Chest pain.  Elevated D-dimer. EXAM: CT ANGIOGRAPHY CHEST WITH CONTRAST TECHNIQUE: Multidetector CT imaging of the chest was performed using the  standard protocol during bolus administration of intravenous contrast. Multiplanar CT image reconstructions and MIPs were obtained to evaluate the vascular anatomy. CONTRAST:  56mL ISOVUE-370 IOPAMIDOL (ISOVUE-370) INJECTION 76% COMPARISON:  03/22/2018 FINDINGS: Cardiovascular: Pulmonary arterial opacification is good. There are no pulmonary emboli. Heart size is upper limits of normal. Calcified left ventricular aneurysm. There has been previous median sternotomy and CABG. The aorta shows atherosclerosis but no aneurysm or dissection. Mediastinum/Nodes: No mediastinal mass or lymphadenopathy. Lungs/Pleura: Worsening loculated pleural fluid collections on the right worrisome for empyema. Tiny dot of air or gas within a medial loculation. No pleural fluid on the left. There is compressive atelectasis and or pneumonia the right lung, particularly in the lower lung. There is mild dependent atelectasis or patchy pneumonia at the left base. Upper Abdomen: No acute finding. Chronic benign intraperitoneal calcifications not of acute significance. Musculoskeletal: Bridging  osteophytes of the spine with ankylosis. No acute bone finding. Review of the MIP images confirms the above findings. IMPRESSION: Worsening of loculated pleural fluid collections in the right hemithorax worrisome for empyema. Associated compressive atelectasis and or pneumonia of the right lung. Mild patchy atelectasis and or pneumonia at the left base. No sign of pulmonary emboli. Aortic Atherosclerosis (ICD10-I70.0). Electronically Signed   By: Nelson Chimes M.D.   On: 03/23/2018 11:23    Cardiac Studies   Echo 12/22/17- Study Conclusions  - Left ventricle: The cavity size was mildly dilated. Wall   thickness was normal. Systolic function was severely reduced. The   estimated ejection fraction was in the range of 25% to 30%. There   is akinesis of the mid-apicalanteroseptal, anterior,   anterolateral, lateral, inferolateral, and apical  myocardium.   Doppler parameters are consistent with abnormal left ventricular   relaxation (grade 1 diastolic dysfunction). Acoustic contrast   opacification revealed no evidence ofthrombus. - Mitral valve: Moderately calcified annulus. - Left atrium: The atrium was severely dilated. - Right ventricle: Pacer wire or catheter noted in right ventricle.   Patient Profile     83 y.o. male with a history of CAD s/p cath 2012, MI and CABG (5784), chronic systolic HF, ischemic cardiomyopathy, ventricular tachycardia storm (currently controlled on Amiodarone), history of ICD, atrial fibrillation (not on anticoagulation d/t hx of subdural hematoma while on coumadin), HTN, HLD, and hypothyroidism who presented to the [facility] ED via EMS 03/22/18 with complaints of right sided chest pain. W/U revealed right pleural effusion.   Assessment & Plan     1.  Right pleural effusion: See Dr Hendrickson's note 03/23/18- plan is for percutaneous drainage with intrapleural thrombolytics.  2.  Chronic combined systolic and diastolic congestive heart failure: And has been very stable with his heart failure. Echo in Dec 2018-EF 25-30%  4.  Paroxysmal atrial fibrillation: Continue amiodarone.  We have lowered the dose slightly because of the worry of amiodarone toxicity. He has not been on anticoagulation secondary to a history of SDH while on Coumadin.  4.  History of ventricular tachycardia: He status post ICD placement.  He seems to be stable on amiodarone.   5.  CAD, hx of MI and CABG 1995 : no angina, Troponin negative  Plan: Per Dr Roxan Hockey- percutaneous drainage of his Rt pleural effusion with interpleural thrombolytics.    For questions or updates, please contact [place] Please consult [email or web address] for contact info under Cardiology/STEMI.      Signed, Kerin Ransom, PA-C  03/24/2018, 7:58 AM    Attending Note:   The patient was seen and examined.  Agree with assessment and plan as noted  above.  Changes made to the above note as needed.  Patient seen and independently examined with Kerin Ransom, PA .   We discussed all aspects of the encounter. I agree with the assessment and plan as stated above.  1.  Loculated right pleural effusion: The patient has been seen by TCT S.  Dr. Roxan Hockey recommends thoracentesis with TPA infusion as an initial trial.  We could consider VATS if that does not work.  2.  Coronary artery disease: Stable.  No angina.  3.  Chronic combined systolic and diastolic congestive heart failure: He remains stable.  No evidence of CHF exacerbation.  4.  Paroxysmal atrial for ablation: Continue low-dose amiodarone.   I have spent a total of 40 minutes with patient reviewing hospital  notes , telemetry, EKGs, labs and examining patient  as well as establishing an assessment and plan that was discussed with the patient. > 50% of time was spent in direct patient care.    Thayer Headings, Brooke Bonito., MD, [facility] 03/24/2018, 11:13 AM [address]  [place] Pager [phone]

## 2018-03-25 ENCOUNTER — Encounter (HOSPITAL_COMMUNITY): Payer: Self-pay | Admitting: Interventional Radiology

## 2018-03-25 ENCOUNTER — Inpatient Hospital Stay (HOSPITAL_COMMUNITY): Payer: Medicare Other

## 2018-03-25 HISTORY — PX: IR GUIDED DRAIN W CATHETER PLACEMENT: IMG719

## 2018-03-25 LAB — LACTATE DEHYDROGENASE: LDH: 133 U/L (ref 98–192)

## 2018-03-25 LAB — PROTEIN, TOTAL: Total Protein: 6 g/dL — ABNORMAL LOW (ref 6.5–8.1)

## 2018-03-25 MED ORDER — MIDAZOLAM HCL 2 MG/2ML IJ SOLN
INTRAMUSCULAR | Status: AC
  Filled 2018-03-25: qty 2

## 2018-03-25 MED ORDER — MIDAZOLAM HCL 2 MG/2ML IJ SOLN
INTRAMUSCULAR | Status: AC | PRN
  Administered 2018-03-25: 0.5 mg via INTRAVENOUS

## 2018-03-25 MED ORDER — LIDOCAINE HCL 1 % IJ SOLN
INTRAMUSCULAR | Status: AC
  Filled 2018-03-25: qty 20

## 2018-03-25 MED ORDER — FENTANYL CITRATE (PF) 100 MCG/2ML IJ SOLN
INTRAMUSCULAR | Status: AC | PRN
  Administered 2018-03-25: 25 ug via INTRAVENOUS

## 2018-03-25 MED ORDER — LIDOCAINE HCL (PF) 1 % IJ SOLN
INTRAMUSCULAR | Status: AC | PRN
  Administered 2018-03-25: 10 mL

## 2018-03-25 MED ORDER — SODIUM CHLORIDE 0.9 % IV SOLN
INTRAVENOUS | Status: AC | PRN
  Administered 2018-03-25: 10 mL/h via INTRAVENOUS

## 2018-03-25 MED ORDER — FENTANYL CITRATE (PF) 100 MCG/2ML IJ SOLN
INTRAMUSCULAR | Status: AC
  Filled 2018-03-25: qty 2

## 2018-03-25 NOTE — Progress Notes (Addendum)
Progress Note  Patient Name: [name] Date of Encounter: 03/25/2018  Primary Cardiologist: Mertie Moores, MD   Subjective   Pt is NPO for chest tube placement in IR today. He denies chest pain, palpitations, and LE swelling.  Inpatient Medications    Scheduled Meds: . amiodarone  50 mg Oral Daily  . aspirin EC  81 mg Oral Daily  . B-complex with vitamin C  1 tablet Oral Daily  . carvedilol  6.25 mg Oral BID WC  . donepezil  5 mg Oral QHS  . fenofibrate  160 mg Oral Daily  . finasteride  5 mg Oral Daily  . levothyroxine  75 mcg Oral QAC breakfast  . lisinopril  5 mg Oral Daily  . multivitamin with minerals  1 tablet Oral Daily  . spironolactone  25 mg Oral Daily  . tamsulosin  0.4 mg Oral QHS   Continuous Infusions:  PRN Meds: acetaminophen, ALPRAZolam, methocarbamol, nitroGLYCERIN, ondansetron (ZOFRAN) IV, ondansetron, traMADol   Vital Signs    Vitals:   03/24/18 0659 03/24/18 1538 03/24/18 1959 03/25/18 0654  BP: 120/72 112/62 105/64   Pulse: 85 70 73 82  Resp: 17  17 16   Temp: 98.9 F (37.2 C) 99.4 F (37.4 C) 99.8 F (37.7 C) 98.6 F (37 C)  TempSrc: Oral Oral Oral Oral  SpO2: 98% 93% 94% 97%  Weight: 154 lb 4.8 oz (70 kg)   152 lb 8 oz (69.2 kg)  Height:        Intake/Output Summary (Last 24 hours) at 03/25/2018 0844 Last data filed at 03/25/2018 0656 Gross per 24 hour  Intake 600 ml  Output 300 ml  Net 300 ml   Filed Weights   03/23/18 1537 03/24/18 0659 03/25/18 0654  Weight: 154 lb 6.4 oz (70 kg) 154 lb 4.8 oz (70 kg) 152 lb 8 oz (69.2 kg)    Telemetry    Sinus rhythm, short burst of Afib, occasional v-pacing - Personally Reviewed  ECG    No new tracings - Personally Reviewed  Physical Exam   GEN: No acute distress.   Neck: No JVD Cardiac: RRR, no murmurs, rubs, or gallops.  Respiratory: diminished on right GI: Soft, nontender, non-distended  MS: No edema; No deformity. Neuro:  Nonfocal  Psych: Normal affect   Labs      Chemistry Recent Labs  Lab 03/22/18 1051 03/23/18 0520  NA 138 136  K 4.2 4.0  CL 107 105  CO2 22 19*  GLUCOSE 113* 119*  BUN 22* 18  CREATININE 1.45* 1.23  CALCIUM 8.6* 8.2*  PROT  --  6.1*  ALBUMIN  --  2.4*  AST  --  23  ALT  --  15*  ALKPHOS  --  26*  BILITOT  --  2.2*  GFRNONAA 44* 54*  GFRAA 51* >60  ANIONGAP 9 12     Hematology Recent Labs  Lab 03/22/18 1051 03/23/18 0520  WBC 10.3 8.6  RBC 3.92* 3.61*  HGB 12.1* 10.9*  HCT 37.0* 34.1*  MCV 94.4 94.5  MCH 30.9 30.2  MCHC 32.7 32.0  RDW 15.6* 15.4  PLT 244 244    Cardiac Enzymes Recent Labs  Lab 03/22/18 1829 03/23/18 0520  TROPONINI <0.03 <0.03    Recent Labs  Lab 03/22/18 1056 03/22/18 1401  TROPIPOC 0.00 0.00     BNPNo results for input(s): BNP, PROBNP in the last 168 hours.   DDimer  Recent Labs  Lab 03/22/18 1829  DDIMER 1.72*  Radiology    Ct Angio Chest Pe W Or Wo Contrast  Result Date: 03/23/2018 CLINICAL DATA:  Chest pain.  Elevated D-dimer. EXAM: CT ANGIOGRAPHY CHEST WITH CONTRAST TECHNIQUE: Multidetector CT imaging of the chest was performed using the standard protocol during bolus administration of intravenous contrast. Multiplanar CT image reconstructions and MIPs were obtained to evaluate the vascular anatomy. CONTRAST:  56mL ISOVUE-370 IOPAMIDOL (ISOVUE-370) INJECTION 76% COMPARISON:  03/22/2018 FINDINGS: Cardiovascular: Pulmonary arterial opacification is good. There are no pulmonary emboli. Heart size is upper limits of normal. Calcified left ventricular aneurysm. There has been previous median sternotomy and CABG. The aorta shows atherosclerosis but no aneurysm or dissection. Mediastinum/Nodes: No mediastinal mass or lymphadenopathy. Lungs/Pleura: Worsening loculated pleural fluid collections on the right worrisome for empyema. Tiny dot of air or gas within a medial loculation. No pleural fluid on the left. There is compressive atelectasis and or pneumonia the right lung,  particularly in the lower lung. There is mild dependent atelectasis or patchy pneumonia at the left base. Upper Abdomen: No acute finding. Chronic benign intraperitoneal calcifications not of acute significance. Musculoskeletal: Bridging osteophytes of the spine with ankylosis. No acute bone finding. Review of the MIP images confirms the above findings. IMPRESSION: Worsening of loculated pleural fluid collections in the right hemithorax worrisome for empyema. Associated compressive atelectasis and or pneumonia of the right lung. Mild patchy atelectasis and or pneumonia at the left base. No sign of pulmonary emboli. Aortic Atherosclerosis (ICD10-I70.0). Electronically Signed   By: Nelson Chimes M.D.   On: 03/23/2018 11:23    Cardiac Studies   Echo 12/22/17: Study Conclusions - Left ventricle: The cavity size was mildly dilated. Wall   thickness was normal. Systolic function was severely reduced. The   estimated ejection fraction was in the range of 25% to 30%. There   is akinesis of the mid-apicalanteroseptal, anterior,   anterolateral, lateral, inferolateral, and apical myocardium.   Doppler parameters are consistent with abnormal left ventricular   relaxation (grade 1 diastolic dysfunction). Acoustic contrast   opacification revealed no evidence ofthrombus. - Mitral valve: Moderately calcified annulus. - Left atrium: The atrium was severely dilated. - Right ventricle: Pacer wire or catheter noted in right ventricle.  Patient Profile     83 y.o. male with a history of CADs/p cath 2012, MI and CABG (3536), chronic systolic HF, ischemic cardiomyopathy, ventricular tachycardia storm(currently controlled on Amiodarone), history of ICD, atrial fibrillation (not on anticoagulation d/t hx of subdural hematoma while on coumadin), HTN, HLD, and hypothyroidism who presented to the [facility] ED via EMS 03/22/18 with complaints of right sided chest pain. W/U revealed right pleural effusion.  Assessment & Plan     1. Chronic combined systolic and diastolic heart failure Pt is euvolemic on exam. No change to medication regimen.  2. Right loculated pleural effusion IR planning for chest tube placement today. TCTS recommendations appreciated.  3. Paroxysmal atrial fibrillation Pt on amiodarone - dose lowered for possible toxicity. Continue 50 mg amiodarone daily. No anticoagulation for subdural bleed on coumadin. Telemetry with short burst of Afib RVR, self-resolved, occasional v-pacing. Pt is asymptomatic.  4. History of ventricular tachycardia ICD in place.   5. CAD, s/p MI and CABG 1995 Pt denies anginal symptoms. Chest pain is related to his pleural effusion.   For questions or updates, please contact [place] Please consult [email or web address] for contact info under Cardiology/STEMI.      Signed, Ledora Bottcher, PA  03/25/2018, 8:44 AM    Attending Note:  The patient was seen and examined.  Agree with assessment and plan as noted above.  Changes made to the above note as needed.  Patient seen and independently examined with Doreene Adas, PA .   We discussed all aspects of the encounter. I agree with the assessment and plan as stated above.  1.  Loculated right pleural effusion: The patient will be going to interventional radiology to have a thoracentesis with thromlysis treatment. Further plans per pulmonary medicine.  2.  Chronic combined systolic and diastolic congestive heart failure: Stable  3.  Coronary artery disease-stable no angina  4.  Paroxysmal atrial fibrillation: On amiodarone.  He is not on anticoagulation because of a subdural hematoma.   I have spent a total of 40 minutes with patient reviewing hospital  notes , telemetry, EKGs, labs and examining patient as well as establishing an assessment and plan that was discussed with the patient. > 50% of time was spent in direct patient care.    Thayer Headings, Brooke Bonito., MD, [facility] 03/25/2018, 10:56 AM [address]  [place] Pager [phone]

## 2018-03-25 NOTE — Sedation Documentation (Signed)
Patient is resting comfortably. 

## 2018-03-25 NOTE — Plan of Care (Signed)
Plan Perc drain today and chest tube placement via CT.

## 2018-03-25 NOTE — Procedures (Signed)
R 12 Fr thoracostomy Cloudy yellow fluid EBL 0 Comp 0

## 2018-03-26 ENCOUNTER — Inpatient Hospital Stay (HOSPITAL_COMMUNITY): Payer: Medicare Other

## 2018-03-26 MED ORDER — DORNASE ALFA 2.5 MG/2.5ML IN SOLN
5.0000 mg | Freq: Once | RESPIRATORY_TRACT | Status: AC
  Administered 2018-03-26: 5 mg via INTRAPLEURAL
  Filled 2018-03-26: qty 5

## 2018-03-26 MED ORDER — SODIUM CHLORIDE 0.9 % IJ SOLN
25.0000 mg | Freq: Once | INTRAMUSCULAR | Status: AC
  Administered 2018-03-26: 25 mg via INTRAPLEURAL
  Filled 2018-03-26: qty 50

## 2018-03-26 NOTE — Progress Notes (Signed)
PULMONARY / CRITICAL CARE MEDICINE   Name: [name] MRN: [id number] DOB: Sep 17, 1935    ADMISSION DATE:  03/22/2018 CONSULTATION DATE: March 23, 2018  REFERRING MD: Dr. Acie Fredrickson  CHIEF COMPLAINT: Chest pain  HISTORY OF PRESENT ILLNESS:   83 year old male with a past medical history significant for coronary artery disease, systolic heart failure, and gallbladder disease requiring a cholecystectomy in December 2018 who returns to our facility with chest pain.  His wife provides most of the history at his request.  She says that he has been stable for the last several weeks.  After his hospitalization in December 2018 he felt fairly weak but had no persistent nausea or vomiting after discharge.  He was discharged on O2, 2 L to be used on exertion which he used briefly.  His appetite improved and his exercise tolerance improved and she says he was back to walking 2-3 miles a day.  He had no respiratory complaint during that time (January February and early March).  No cough, no wheeze, no chest pain, no hemoptysis.  He lost approximately 20 pounds around the time of his cholecystectomy.  However yesterday he developed the sudden onset of a constant chest pain which is dull in character and substernal.  The pain has now resolved.  This has not been associated with fevers or chills.  He was admitted to the cardiology service and the clinical impression is that the pain is not due to ischemia.  A CT scan of his chest was performed yesterday showing a large loculated and complicated pleural effusion on the right.   SUBJECTIVE:   Successful pigtail placement 3/28 CXR reviewed   VITAL SIGNS: BP 108/63 (BP Location: Left Arm)   Pulse 72   Temp 98.4 F (36.9 C) (Oral)   Resp (!) 22   Ht 5\' 11"  (1.803 m)   Wt 70 kg (154 lb 5.2 oz)   SpO2 94%   BMI 21.52 kg/m   HEMODYNAMICS:    VENTILATOR SETTINGS:    INTAKE / OUTPUT: I/O last 3 completed shifts: In: 1200 [P.O.:1200] Out: 806  [Urine:700; Chest Tube:106]  PHYSICAL EXAMINATION:  General:  Healthy appearing 83 y/o male resting comfortably in bed, in NAD HENT: NCAT OP clear PULM: Diminished R base, normal effort CV: RRR, no mgr GI: BS+, soft, nontender MSK: normal bulk and tone Neuro: awake, alert, no distress, MAE   LABS:  BMET Recent Labs  Lab 03/22/18 1051 03/23/18 0520  NA 138 136  K 4.2 4.0  CL 107 105  CO2 22 19*  BUN 22* 18  CREATININE 1.45* 1.23  GLUCOSE 113* 119*    Electrolytes Recent Labs  Lab 03/22/18 1051 03/22/18 1829 03/23/18 0520  CALCIUM 8.6*  --  8.2*  MG  --  1.9  --     CBC Recent Labs  Lab 03/22/18 1051 03/23/18 0520  WBC 10.3 8.6  HGB 12.1* 10.9*  HCT 37.0* 34.1*  PLT 244 244    Coag's Recent Labs  Lab 03/22/18 1829  INR 1.39    Sepsis Markers No results for input(s): LATICACIDVEN, PROCALCITON, O2SATVEN in the last 168 hours.  ABG No results for input(s): PHART, PCO2ART, PO2ART in the last 168 hours.  Liver Enzymes Recent Labs  Lab 03/23/18 0520  AST 23  ALT 15*  ALKPHOS 26*  BILITOT 2.2*  ALBUMIN 2.4*    Cardiac Enzymes Recent Labs  Lab 03/22/18 1829 03/23/18 0520  TROPONINI <0.03 <0.03    Glucose Recent Labs  Lab 03/22/18 1248  GLUCAP 94    Imaging Ir Guided Drain W Catheter Placement  Result Date: 03/25/2018 INDICATION: Right pleural effusion EXAM: RIGHT THORACOSTOMY MEDICATIONS: The patient is currently admitted to the hospital and receiving intravenous antibiotics. The antibiotics were administered within an appropriate time frame prior to the initiation of the procedure. ANESTHESIA/SEDATION: Fentanyl 25 mcg IV; Versed 0.5 mg IV Moderate Sedation Time:  10 minutes The patient was continuously monitored during the procedure by the interventional radiology nurse under my direct supervision. COMPLICATIONS: None immediate. PROCEDURE: Informed written consent was obtained from the patient after a thorough discussion of the  procedural risks, benefits and alternatives. All questions were addressed. Maximal Sterile Barrier Technique was utilized including caps, mask, sterile gowns, sterile gloves, sterile drape, hand hygiene and skin antiseptic. A timeout was performed prior to the initiation of the procedure. Under sonographic guidance, a micropuncture needle was inserted into the right pleural effusion the mid axillary line approach. It was removed over a 018 wire which was up sized to an Amplatz. A 12 French pigtail drain was advanced over the wire and coiled in the fluid collection. It was looped and string fixed then sewn to the skin. Cloudy yellow fluid was aspirated. It was attached to a pleura vac at 20 cm water suction. FINDINGS: Images confirm 63 French right thoracostomy placement in the right pleural effusion. IMPRESSION: Successful 12 French right thoracostomy tube placement. Electronically Signed   By: Marybelle Killings M.D.   On: 03/25/2018 16:02   Dg Chest Port 1 View  Result Date: 03/26/2018 CLINICAL DATA:  Follow-up chest tube drainage of pleural effusion. EXAM: PORTABLE CHEST 1 VIEW COMPARISON:  03/22/2018 FINDINGS: Previous median sternotomy and CABG. Pacemaker/AICD. Left chest remains clear. Pigtail drainage catheter in the pleural space at the right base. Diminished but persistent right pleural density with volume loss and/or infiltrate in the right lower lung. No pneumothorax. IMPRESSION: Right pleural drainage catheter in place. Persistent right pleural density with dependent pulmonary atelectasis/infiltrate. No pneumothorax. Electronically Signed   By: Nelson Chimes M.D.   On: 03/26/2018 07:54     STUDIES:  CT angiogram chest March 22, 2018 images independently reviewed showing a loculated and complicated appearing pleural effusion on the right.,  No PE  CULTURES: Pleural fluid 3/29 >>  ABF 3/29 >>  Fungal 3/29 >>   ANTIBIOTICS: None  SIGNIFICANT EVENTS:   LINES/TUBES: R pigtail chest tube 3/28  >>   DISCUSSION: 83 year old male with ischemic cardiomyopathy who has a complicated pleural effusion on the right causing chest pain.  The most likely explanation for this effusion is that it is an empyema somehow related to the cholecystectomy he had in December.  Differential diagnosis includes silent aspiration.  ASSESSMENT / PLAN:  Complicated and loculated pleural effusion, likely empyema P:   CXR reviewed > plan for instillation t-PA + DNase into chest tube today. Hopefully will allow further drainage. Consider repeat instillation next few days if residual fluid present.  If above is unsuccessful, CVTS to follow and will consider options for VATS with decortication (but hopefully can avoid).  I sent cx data from pleural tube fluid today  Chest pain> doubt ischemia Ischemic cardiomyopathy P:  Follow     Baltazar Apo, MD, PhD 03/26/2018, 2:39 PM [facility] [phone] or if no answer [phone]

## 2018-03-26 NOTE — Progress Notes (Signed)
Referring Physician(s): Dr Franco Collet  Supervising Physician: Daryll Brod  Patient Status:  [facility] - In-pt  Chief Complaint:  Rt empyema Rt chest tube drain placed 3/28  Subjective:  Resting in bed +cough-- especially when talking Denies pain  LOV:FIEPP pleural drainage catheter in place. Persistent right pleural density with dependent pulmonary atelectasis/infiltrate. No Pneumothorax.   Allergies: Patient has no known allergies.  Medications: Prior to Admission medications   Medication Sig Start Date End Date Taking? Authorizing Provider  amiodarone (PACERONE) 100 MG tablet Take 0.5 tablets (50 mg total) by mouth daily. 11/30/17  Yes Deboraha Sprang, MD  aspirin EC 81 MG tablet Take 81 mg by mouth daily.   Yes [provider]  b complex vitamins tablet Take 1 tablet by mouth daily.   Yes [provider]  carvedilol (COREG) 6.25 MG tablet TAKE 1 TABLET BY MOUTH 2 TIMES DAILY WITH A MEAL. Patient taking differently: TAKE 1 TABLET (6.25 MG) BY MOUTH 2 TIMES DAILY WITH A MEAL. 07/02/17  Yes Deboraha Sprang, MD  Coenzyme Q10 (COQ10 PO) Take 1 capsule by mouth daily.   Yes [provider]  donepezil (ARICEPT) 5 MG tablet Take 5 mg by mouth at bedtime.    Yes [provider]  fenofibrate 160 MG tablet Take 1 tablet (160 mg total) by mouth daily. 09/17/17  Yes Nahser, Wonda Cheng, MD  levothyroxine (SYNTHROID, LEVOTHROID) 75 MCG tablet Take 1 tablet (75 mcg total) by mouth daily before breakfast. 12/30/17  Yes Aline August, MD  lisinopril (PRINIVIL,ZESTRIL) 5 MG tablet Take 1 tablet (5 mg total) by mouth daily. Patient taking differently: Take 2.5 mg by mouth 2 (two) times daily.  02/10/18  Yes Nahser, Wonda Cheng, MD  LYCOPENE PO Take 1 capsule by mouth daily.   Yes [provider]  Multiple Vitamin (MULTIVITAMIN WITH MINERALS) TABS tablet Take 1 tablet by mouth daily.   Yes [provider]  nitroGLYCERIN (NITROSTAT) 0.4 MG SL tablet  Place 1 tablet (0.4 mg total) under the tongue every 5 (five) minutes as needed for chest pain. For chest pain x 3 doses Patient taking differently: Place 0.4 mg under the tongue every 5 (five) minutes as needed for chest pain (max 3 doses).  03/22/13  Yes Deboraha Sprang, MD  OVER THE COUNTER MEDICATION Take 1 tablet by mouth daily. "prostrate essentials"   Yes [provider]  spironolactone (ALDACTONE) 25 MG tablet Take 1 tablet (25 mg total) by mouth daily. 11/30/17  Yes Nahser, Wonda Cheng, MD  Tamsulosin HCl (FLOMAX) 0.4 MG CAPS Take 0.4 mg by mouth at bedtime.  03/31/11  Yes [provider]  hydrocortisone (ANUSOL-HC) 2.5 % rectal cream Apply 1 application topically 4 (four) times daily as needed for hemorrhoids. Patient not taking: Reported on 03/22/2018 12/30/17   Aline August, MD  methocarbamol (ROBAXIN) 500 MG tablet Take 2 tablets (1,000 mg total) by mouth every 6 (six) hours as needed for muscle spasms. Patient not taking: Reported on 03/22/2018 12/30/17   Aline August, MD  ondansetron (ZOFRAN) 4 MG tablet Take 1 tablet (4 mg total) by mouth every 6 (six) hours as needed for nausea. Patient not taking: Reported on 03/22/2018 12/30/17   Aline August, MD  traMADol (ULTRAM) 50 MG tablet Take 1 tablet (50 mg total) by mouth every 6 (six) hours as needed for moderate pain. Patient not taking: Reported on 03/22/2018 12/30/17   Aline August, MD     Vital Signs: BP [name] Kitchen)  109/54 (BP Location: Left Arm)   Pulse 72   Temp 99 F (37.2 C) (Oral)   Resp (!) 22   Ht 5\' 11"  (1.803 m)   Wt 154 lb 5.2 oz (70 kg)   SpO2 95%   BMI 21.52 kg/m   Physical Exam  Pulmonary/Chest: Effort normal and breath sounds normal.  Neurological: He is alert.  Skin: Skin is warm and dry.  Rt chest tube drain intact Skin site is clean and dry NT no bleeding No air leak  120 cc in Pleurvac-- serous fluid  Nursing note and vitals reviewed.   Imaging: Dg Chest 2 View  Result Date:  03/22/2018 CLINICAL DATA:  Chest pain radiating across the chest began today. History of previous MI, CHF, CABG. EXAM: CHEST - 2 VIEW COMPARISON:  PA and lateral chest x-ray of January 20, 2018 FINDINGS: There is a moderate-sized right pleural effusion. There is right basilar parenchymal density as well. The left lung is clear. The heart is top-normal in size. The pulmonary vascularity is not engorged. The ICD is in stable position. The sternal wires are intact. There is calcification in the wall of the aortic arch. There is multilevel degenerative disc disease of the thoracic spine. IMPRESSION: New right pleural effusion. There is likely superimposed right basilar atelectasis or pneumonia. Top-normal cardiac size without overt pulmonary edema. Previous CABG. Thoracic aortic atherosclerosis. Electronically Signed   By: David  Martinique M.D.   On: 03/22/2018 12:26   Ct Chest Wo Contrast  Result Date: 03/22/2018 CLINICAL DATA:  Chest pain set a EXAM: CT CHEST WITHOUT CONTRAST TECHNIQUE: Multidetector CT imaging of the chest was performed following the standard protocol without IV contrast. COMPARISON:  Chest radiograph 03/22/2018 FINDINGS: Cardiovascular: Coronary, aortic arch, and branch vessel atherosclerotic vascular disease. Mild cardiomegaly. Calcification and pseudoaneurysm of the left ventricle indicating prior myocardial infarction. The right ventricular ICD lead is up near the pulmonary valve rather than at the right ventricular apex. However, this seems to be a chronic configuration in this patient. Prior CABG. Severe bilateral degenerative glenohumeral arthropathy. Mediastinum/Nodes: Unremarkable Lungs/Pleura: Moderate-sized loculated right pleural effusion. Atelectasis of most of the right lower lobe with loculated fluid collections along the fissures. There is some atelectasis in the right middle lobe as well. Trace left pleural effusion with some fluid tracking along the left major fissure. Upper  Abdomen: Borderline elevated right hemidiaphragm. Cholecystectomy. Dense atherosclerotic calcification in the celiac artery. Stable rim calcified structure in the right upper abdomen adjacent to the IVC and liver on image 131/3. Musculoskeletal: Thoracic spondylosis. IMPRESSION: 1. Moderate-sized loculated right pleural effusion. Small left pleural effusion. Passive atelectasis in the right lung. 2. Calcification along a pseudoaneurysm of the left ventricle indicating prior myocardial infarction. 3. Chronic borderline malpositioning of the right ventricular ICD lead. Instead of being along the right ventricular apex it is up near the pulmonary valve. 4. Mild cardiomegaly. 5. Borderline elevated right hemidiaphragm. 6. Thoracic spondylosis. 7.  Aortic Atherosclerosis (ICD10-I70.0).  Prior CABG. Electronically Signed   By: Van Clines M.D.   On: 03/22/2018 19:07   Ct Angio Chest Pe W Or Wo Contrast  Result Date: 03/23/2018 CLINICAL DATA:  Chest pain.  Elevated D-dimer. EXAM: CT ANGIOGRAPHY CHEST WITH CONTRAST TECHNIQUE: Multidetector CT imaging of the chest was performed using the standard protocol during bolus administration of intravenous contrast. Multiplanar CT image reconstructions and MIPs were obtained to evaluate the vascular anatomy. CONTRAST:  53mL ISOVUE-370 IOPAMIDOL (ISOVUE-370) INJECTION 76% COMPARISON:  03/22/2018 FINDINGS: Cardiovascular: Pulmonary arterial  opacification is good. There are no pulmonary emboli. Heart size is upper limits of normal. Calcified left ventricular aneurysm. There has been previous median sternotomy and CABG. The aorta shows atherosclerosis but no aneurysm or dissection. Mediastinum/Nodes: No mediastinal mass or lymphadenopathy. Lungs/Pleura: Worsening loculated pleural fluid collections on the right worrisome for empyema. Tiny dot of air or gas within a medial loculation. No pleural fluid on the left. There is compressive atelectasis and or pneumonia the right  lung, particularly in the lower lung. There is mild dependent atelectasis or patchy pneumonia at the left base. Upper Abdomen: No acute finding. Chronic benign intraperitoneal calcifications not of acute significance. Musculoskeletal: Bridging osteophytes of the spine with ankylosis. No acute bone finding. Review of the MIP images confirms the above findings. IMPRESSION: Worsening of loculated pleural fluid collections in the right hemithorax worrisome for empyema. Associated compressive atelectasis and or pneumonia of the right lung. Mild patchy atelectasis and or pneumonia at the left base. No sign of pulmonary emboli. Aortic Atherosclerosis (ICD10-I70.0). Electronically Signed   By: Nelson Chimes M.D.   On: 03/23/2018 11:23   Ir Guided Niel Hummer W Catheter Placement  Result Date: 03/25/2018 INDICATION: Right pleural effusion EXAM: RIGHT THORACOSTOMY MEDICATIONS: The patient is currently admitted to the hospital and receiving intravenous antibiotics. The antibiotics were administered within an appropriate time frame prior to the initiation of the procedure. ANESTHESIA/SEDATION: Fentanyl 25 mcg IV; Versed 0.5 mg IV Moderate Sedation Time:  10 minutes The patient was continuously monitored during the procedure by the interventional radiology nurse under my direct supervision. COMPLICATIONS: None immediate. PROCEDURE: Informed written consent was obtained from the patient after a thorough discussion of the procedural risks, benefits and alternatives. All questions were addressed. Maximal Sterile Barrier Technique was utilized including caps, mask, sterile gowns, sterile gloves, sterile drape, hand hygiene and skin antiseptic. A timeout was performed prior to the initiation of the procedure. Under sonographic guidance, a micropuncture needle was inserted into the right pleural effusion the mid axillary line approach. It was removed over a 018 wire which was up sized to an Amplatz. A 12 French pigtail drain was advanced  over the wire and coiled in the fluid collection. It was looped and string fixed then sewn to the skin. Cloudy yellow fluid was aspirated. It was attached to a pleura vac at 20 cm water suction. FINDINGS: Images confirm 39 French right thoracostomy placement in the right pleural effusion. IMPRESSION: Successful 12 French right thoracostomy tube placement. Electronically Signed   By: Marybelle Killings M.D.   On: 03/25/2018 16:02   Dg Chest Port 1 View  Result Date: 03/26/2018 CLINICAL DATA:  Follow-up chest tube drainage of pleural effusion. EXAM: PORTABLE CHEST 1 VIEW COMPARISON:  03/22/2018 FINDINGS: Previous median sternotomy and CABG. Pacemaker/AICD. Left chest remains clear. Pigtail drainage catheter in the pleural space at the right base. Diminished but persistent right pleural density with volume loss and/or infiltrate in the right lower lung. No pneumothorax. IMPRESSION: Right pleural drainage catheter in place. Persistent right pleural density with dependent pulmonary atelectasis/infiltrate. No pneumothorax. Electronically Signed   By: Nelson Chimes M.D.   On: 03/26/2018 07:54    Labs:  CBC: Recent Labs    12/25/17 0538 12/27/17 0934 03/22/18 1051 03/23/18 0520  WBC 18.5* 13.7* 10.3 8.6  HGB 14.2 13.5 12.1* 10.9*  HCT 42.4 40.2 37.0* 34.1*  PLT 149* 214 244 244    COAGS: Recent Labs    03/22/18 1829  INR 1.39    BMP: Recent  Labs    12/28/17 1332 02/08/18 0828 03/22/18 1051 03/23/18 0520  NA 134* 135 138 136  K 3.6 4.7 4.2 4.0  CL 104 100 107 105  CO2 23 23 22  19*  GLUCOSE 142* 94 113* 119*  BUN 18 16 22* 18  CALCIUM 8.0* 8.8 8.6* 8.2*  CREATININE 1.07 1.11 1.45* 1.23  GFRNONAA >60 62 44* 54*  GFRAA >60 72 51* >60    LIVER FUNCTION TESTS: Recent Labs    12/24/17 0613 12/25/17 0538 02/08/18 0828 03/23/18 0520 03/25/18 1611  BILITOT 1.5* 2.0* 0.5 2.2*  --   AST 25 28 23 23   --   ALT 21 23 18  15*  --   ALKPHOS 24* 29* 28* 26*  --   PROT 5.5* 5.7* 6.0 6.1*  6.0*  ALBUMIN 2.5* 2.5* 3.4* 2.4*  --     Assessment and Plan:  Rt empyema Rt chest tube placed 3/28 Draining well Will follow  Electronically Signed: Forestine Macho A, PA-C 03/26/2018, 10:20 AM   I spent a total of 15 Minutes at the the patient's bedside AND on the patient's hospital floor or unit, greater than 50% of which was counseling/coordinating care for rt chest tube drain

## 2018-03-26 NOTE — Progress Notes (Signed)
I unclamped chest tube drain 1 after after PA administered medication to chest tube. Pt very quickly had orangish pleural fluid which then turned into a little bit of red tinged fluid followed by orangish fluid again. Pt complained at soreness at the site but tolerated well. Patients sats still in the low 90s once chest tube unclamped. Pt put out approximately 1 liter of fluid.

## 2018-03-26 NOTE — Progress Notes (Signed)
PCCM Interval Progress Note  25mg  intrapleural tPA and 5mg  pulmozyme administered through right sided chest tube and tube clamped.  Dwell time planned for 1 hour.  RN asked to unclamp tube after 1 hour dwell time and connect [place] to 20cm suction.   Montey Hora, [facility] Pager: [phone]  or [phone] 03/26/2018, 3:41 PM

## 2018-03-26 NOTE — Progress Notes (Addendum)
Progress Note  Patient Name: [name] Date of Encounter: 03/26/2018  Primary Cardiologist: Mertie Moores, MD   Subjective   Pt still complains of cough. CXR with effusion. No chest pain or palpitations.  Inpatient Medications    Scheduled Meds: . amiodarone  50 mg Oral Daily  . aspirin EC  81 mg Oral Daily  . B-complex with vitamin C  1 tablet Oral Daily  . carvedilol  6.25 mg Oral BID WC  . donepezil  5 mg Oral QHS  . fenofibrate  160 mg Oral Daily  . finasteride  5 mg Oral Daily  . levothyroxine  75 mcg Oral QAC breakfast  . lisinopril  5 mg Oral Daily  . multivitamin with minerals  1 tablet Oral Daily  . spironolactone  25 mg Oral Daily  . tamsulosin  0.4 mg Oral QHS   Continuous Infusions:  PRN Meds: acetaminophen, ALPRAZolam, methocarbamol, nitroGLYCERIN, ondansetron (ZOFRAN) IV, ondansetron, traMADol   Vital Signs    Vitals:   03/25/18 1615 03/25/18 1951 03/25/18 2045 03/26/18 0500  BP: 110/61 107/65  (!) 109/54  Pulse:  77  72  Resp:  (!) 22  (!) 22  Temp:  98.1 F (36.7 C)  99 F (37.2 C)  TempSrc:  Oral  Oral  SpO2: 94% 93% 92% 95%  Weight:    154 lb 5.2 oz (70 kg)  Height:        Intake/Output Summary (Last 24 hours) at 03/26/2018 0747 Last data filed at 03/26/2018 0500 Gross per 24 hour  Intake 840 ml  Output 506 ml  Net 334 ml   Filed Weights   03/24/18 0659 03/25/18 0654 03/26/18 0500  Weight: 154 lb 4.8 oz (70 kg) 152 lb 8 oz (69.2 kg) 154 lb 5.2 oz (70 kg)    Telemetry    Sinus with burst of Afib, V-pacing - Personally Reviewed  ECG    No new tracings - Personally Reviewed  Physical Exam   GEN: No acute distress.   Neck: No JVD Cardiac: RRR, no murmurs, rubs, or gallops.  Respiratory: Clear to auscultation on left, diminished in right GI: Soft, nontender, non-distended  MS: No edema; No deformity. Neuro:  Nonfocal  Psych: Normal affect   Labs    Chemistry Recent Labs  Lab 03/22/18 1051 03/23/18 0520  03/25/18 1611  NA 138 136  --   K 4.2 4.0  --   CL 107 105  --   CO2 22 19*  --   GLUCOSE 113* 119*  --   BUN 22* 18  --   CREATININE 1.45* 1.23  --   CALCIUM 8.6* 8.2*  --   PROT  --  6.1* 6.0*  ALBUMIN  --  2.4*  --   AST  --  23  --   ALT  --  15*  --   ALKPHOS  --  26*  --   BILITOT  --  2.2*  --   GFRNONAA 44* 54*  --   GFRAA 51* >60  --   ANIONGAP 9 12  --      Hematology Recent Labs  Lab 03/22/18 1051 03/23/18 0520  WBC 10.3 8.6  RBC 3.92* 3.61*  HGB 12.1* 10.9*  HCT 37.0* 34.1*  MCV 94.4 94.5  MCH 30.9 30.2  MCHC 32.7 32.0  RDW 15.6* 15.4  PLT 244 244    Cardiac Enzymes Recent Labs  Lab 03/22/18 1829 03/23/18 0520  TROPONINI <0.03 <0.03  Recent Labs  Lab 03/22/18 1056 03/22/18 1401  TROPIPOC 0.00 0.00     BNPNo results for input(s): BNP, PROBNP in the last 168 hours.   DDimer  Recent Labs  Lab 03/22/18 1829  DDIMER 1.72*     Radiology    Ir Guided Niel Hummer W Catheter Placement  Result Date: 03/25/2018 INDICATION: Right pleural effusion EXAM: RIGHT THORACOSTOMY MEDICATIONS: The patient is currently admitted to the hospital and receiving intravenous antibiotics. The antibiotics were administered within an appropriate time frame prior to the initiation of the procedure. ANESTHESIA/SEDATION: Fentanyl 25 mcg IV; Versed 0.5 mg IV Moderate Sedation Time:  10 minutes The patient was continuously monitored during the procedure by the interventional radiology nurse under my direct supervision. COMPLICATIONS: None immediate. PROCEDURE: Informed written consent was obtained from the patient after a thorough discussion of the procedural risks, benefits and alternatives. All questions were addressed. Maximal Sterile Barrier Technique was utilized including caps, mask, sterile gowns, sterile gloves, sterile drape, hand hygiene and skin antiseptic. A timeout was performed prior to the initiation of the procedure. Under sonographic guidance, a micropuncture  needle was inserted into the right pleural effusion the mid axillary line approach. It was removed over a 018 wire which was up sized to an Amplatz. A 12 French pigtail drain was advanced over the wire and coiled in the fluid collection. It was looped and string fixed then sewn to the skin. Cloudy yellow fluid was aspirated. It was attached to a pleura vac at 20 cm water suction. FINDINGS: Images confirm 3 French right thoracostomy placement in the right pleural effusion. IMPRESSION: Successful 12 French right thoracostomy tube placement. Electronically Signed   By: Marybelle Killings M.D.   On: 03/25/2018 16:02    Cardiac Studies   Echo 12/22/17: Study Conclusions - Left ventricle: The cavity size was mildly dilated. Wall thickness was normal. Systolic function was severely reduced. The estimated ejection fraction was in the range of 25% to 30%. There is akinesis of the mid-apicalanteroseptal, anterior, anterolateral, lateral, inferolateral, and apical myocardium. Doppler parameters are consistent with abnormal left ventricular relaxation (grade 1 diastolic dysfunction). Acoustic contrast opacification revealed no evidence ofthrombus. - Mitral valve: Moderately calcified annulus. - Left atrium: The atrium was severely dilated. - Right ventricle: Pacer wire or catheter noted in right ventricle.  Patient Profile     83 y.o. male with a history of CADs/p cath 2012, MI and CABG (4540), chronic systolic HF, ischemic cardiomyopathy, ventricular tachycardia storm(currently controlled on Amiodarone), history of ICD, atrial fibrillation (not on anticoagulation d/t hx of subdural hematoma while on coumadin), HTN, HLD, and hypothyroidism who presented to the [facility] ED via EMS3/25/19with complaints of right sidedchest pain.W/U revealed right pleural effusion. S/P right chest tube placement with IR 03/25/18.  Assessment & Plan    1. Loculated right pleural effusion S/P chest tube insertion  with IR yesterday. Output appears low. Awaiting recs for thrombolytics vs VATS.  2. Chronic combined systolic and diastolic heart failure Pt is euvolemic. No medication changes.  3. Paroxysmal Afib Pt on lower dose of amiodarone. Telemetry generally with sinus, burst of Afib, some V-pacing. Continue on low dose amiodarone. No AC.   4. Hx of ventricular tachycardia ICD in place.  5. CAD s/p MI and CABG 1995 No chest pain.    For questions or updates, please contact [place] Please consult [email or web address] for contact info under Cardiology/STEMI.      Signed, Ledora Bottcher, PA  03/26/2018, 7:47 AM    Attending  Note:   The patient was seen and examined.  Agree with assessment and plan as noted above.  Changes made to the above note as needed.  Patient seen and independently examined with Angie Duke,PA .   We discussed all aspects of the encounter. I agree with the assessment and plan as stated above.  1.   Right pleural effusion:   S/p thoracentesis.   Chest tube in place  Will ask pulmonary to follow along with [place] and help with decisions going forward.   Have discussed with [facility].   They will assume care for the patient and we will sign off  2.  CAD:  No angina  3 chronic combined systolic and diastolic CHF:   Stable   No active cardiac issues.   Please call for questions   I have spent a total of 40 minutes with patient reviewing hospital  notes , telemetry, EKGs, labs and examining patient as well as establishing an assessment and plan that was discussed with the patient. > 50% of time was spent in direct patient care.    Thayer Headings, Brooke Bonito., MD, [facility] 03/26/2018, 11:38 AM [address]  [place] Pager [phone]

## 2018-03-26 NOTE — Plan of Care (Signed)
Pt understands the need for assistance with managing chest tube if he needs to ambulate

## 2018-03-27 ENCOUNTER — Inpatient Hospital Stay (HOSPITAL_COMMUNITY): Payer: Medicare Other

## 2018-03-27 LAB — BASIC METABOLIC PANEL
Anion gap: 8 (ref 5–15)
BUN: 25 mg/dL — ABNORMAL HIGH (ref 6–20)
CO2: 25 mmol/L (ref 22–32)
Calcium: 8.4 mg/dL — ABNORMAL LOW (ref 8.9–10.3)
Chloride: 102 mmol/L (ref 101–111)
Creatinine, Ser: 1.52 mg/dL — ABNORMAL HIGH (ref 0.61–1.24)
GFR calc Af Amer: 48 mL/min — ABNORMAL LOW (ref >60)
GFR calc non Af Amer: 42 mL/min — ABNORMAL LOW (ref >60)
Glucose, Bld: 152 mg/dL — ABNORMAL HIGH (ref 65–99)
Potassium: 4.3 mmol/L (ref 3.5–5.1)
Sodium: 135 mmol/L (ref 135–145)

## 2018-03-27 LAB — CBC
HCT: 39.3 % (ref 39.0–52.0)
Hemoglobin: 12.6 g/dL — ABNORMAL LOW (ref 13.0–17.0)
MCH: 29.7 pg (ref 26.0–34.0)
MCHC: 32.1 g/dL (ref 30.0–36.0)
MCV: 92.7 fL (ref 78.0–100.0)
Platelets: 352 10*3/uL (ref 150–400)
RBC: 4.24 MIL/uL (ref 4.22–5.81)
RDW: 14.3 % (ref 11.5–15.5)
WBC: 16.2 10*3/uL — ABNORMAL HIGH (ref 4.0–10.5)

## 2018-03-27 LAB — ACID FAST SMEAR (AFB, MYCOBACTERIA): Acid Fast Smear: NEGATIVE

## 2018-03-27 LAB — ACID FAST SMEAR (AFB)

## 2018-03-27 MED ORDER — PIPERACILLIN-TAZOBACTAM 3.375 G IVPB
3.3750 g | Freq: Three times a day (TID) | INTRAVENOUS | Status: DC
  Administered 2018-03-27 – 2018-03-29 (×6): 3.375 g via INTRAVENOUS
  Filled 2018-03-27 (×8): qty 50

## 2018-03-27 MED ORDER — VANCOMYCIN HCL IN DEXTROSE 1-5 GM/200ML-% IV SOLN
1000.0000 mg | INTRAVENOUS | Status: DC
  Administered 2018-03-28 – 2018-03-29 (×2): 1000 mg via INTRAVENOUS
  Filled 2018-03-27 (×2): qty 200

## 2018-03-27 MED ORDER — VANCOMYCIN HCL 10 G IV SOLR
1500.0000 mg | Freq: Once | INTRAVENOUS | Status: AC
  Administered 2018-03-27: 1500 mg via INTRAVENOUS
  Filled 2018-03-27: qty 1500

## 2018-03-27 NOTE — Progress Notes (Signed)
Pharmacy Antibiotic Note  [name] is a 83 y.o. male admitted on 03/22/2018 with pneumonia.  Pharmacy has been consulted for vancomycin and Zosyn dosing.  Chest CT: large loculated and complicated pleural effusion on the right  Pending cultures  Plan: Vancomycin 1500 mg IV followed by 1000 mg IV every 24 hours.  Goal trough 15-20 mcg/mL. Zosyn 3.375g IV q8h (4 hour infusion).  F/U narrowing spectrum, vanc trough prn  Height: 5\' 11"  (180.3 cm) Weight: 146 lb (66.2 kg) IBW/kg (Calculated) : 75.3  Temp (24hrs), Avg:98 F (36.7 C), Min:97.5 F (36.4 C), Max:98.4 F (36.9 C)  Recent Labs  Lab 03/22/18 1051 03/23/18 0520 03/27/18 0431  WBC 10.3 8.6 16.2*  CREATININE 1.45* 1.23 1.52*    Estimated Creatinine Clearance: 36.3 mL/min (A) (by C-G formula based on SCr of 1.52 mg/dL (H)).    No Known Allergies  Antimicrobials this admission: Vancomycin 3/30 >>  Zosyn 3/30 >>   Dose adjustments this admission: n/a  Microbiology results: 3/29 Pleural: gram neg rods 3/29 Acid fast > sent 3/29 fungus culture > sent 3/26 mrsa pcr: neg  Angus Seller, PharmD Pharmacy Resident Clinical Phone for 03/27/2018 until 3:30pm: [phone] If after 3:30pm, please call main pharmacy at [phone] 03/27/2018 1:49 PM

## 2018-03-27 NOTE — Progress Notes (Signed)
PULMONARY / CRITICAL CARE MEDICINE   Name: [name] MRN: [id number] DOB: May 03, 1935    ADMISSION DATE:  03/22/2018 CONSULTATION DATE: March 23, 2018  REFERRING MD: Dr. Acie Fredrickson  CHIEF COMPLAINT: Chest pain  HISTORY OF PRESENT ILLNESS:   83 year old male with a past medical history significant for coronary artery disease, systolic heart failure, and gallbladder disease requiring a cholecystectomy in December 2018 who returns to our facility with chest pain.  His wife provides most of the history at his request.  She says that he has been stable for the last several weeks.  After his hospitalization in December 2018 he felt fairly weak but had no persistent nausea or vomiting after discharge.  He was discharged on O2, 2 L to be used on exertion which he used briefly.  His appetite improved and his exercise tolerance improved and she says he was back to walking 2-3 miles a day.  He had no respiratory complaint during that time (January February and early March).  No cough, no wheeze, no chest pain, no hemoptysis.  He lost approximately 20 pounds around the time of his cholecystectomy.  However yesterday he developed the sudden onset of a constant chest pain which is dull in character and substernal.  The pain has now resolved.  This has not been associated with fevers or chills.  He was admitted to the cardiology service and the clinical impression is that the pain is not due to ischemia.  A CT scan of his chest was performed yesterday showing a large loculated and complicated pleural effusion on the right.   SUBJECTIVE:   Pt with chest tube TPA administration 3/29 with good drainage of effusion . , large amt out .  Doing well this am . Alert and eating .   VITAL SIGNS: BP (!) 100/56 (BP Location: Left Arm)   Pulse 65   Temp (!) 97.5 F (36.4 C) (Oral)   Resp 16   Ht 5\' 11"  (1.803 m)   Wt 146 lb (66.2 kg)   SpO2 96%   BMI 20.36 kg/m   HEMODYNAMICS:    VENTILATOR SETTINGS:     INTAKE / OUTPUT: I/O last 3 completed shifts: In: 1200 [P.O.:1200] Out: 2118 [Urine:350; Chest Tube:1768]  PHYSICAL EXAMINATION:  General:  Thin male lying in bed, nad  HEENT : AT/[place]  PULM: Decreased BS in base on right , Pigtail in place on right , no air leak .  CV: RRR, No m/r/g.  GI:  BS + , NT  MSK: intact /no edema  Neuro: a/o x 3 , no focal deficits noted.    LABS:  BMET Recent Labs  Lab 03/22/18 1051 03/23/18 0520 03/27/18 0431  NA 138 136 135  K 4.2 4.0 4.3  CL 107 105 102  CO2 22 19* 25  BUN 22* 18 25*  CREATININE 1.45* 1.23 1.52*  GLUCOSE 113* 119* 152*    Electrolytes Recent Labs  Lab 03/22/18 1051 03/22/18 1829 03/23/18 0520 03/27/18 0431  CALCIUM 8.6*  --  8.2* 8.4*  MG  --  1.9  --   --     CBC Recent Labs  Lab 03/22/18 1051 03/23/18 0520 03/27/18 0431  WBC 10.3 8.6 16.2*  HGB 12.1* 10.9* 12.6*  HCT 37.0* 34.1* 39.3  PLT 244 244 352    Coag's Recent Labs  Lab 03/22/18 1829  INR 1.39    Sepsis Markers No results for input(s): LATICACIDVEN, PROCALCITON, O2SATVEN in the last 168 hours.  ABG No  results for input(s): PHART, PCO2ART, PO2ART in the last 168 hours.  Liver Enzymes Recent Labs  Lab 03/23/18 0520  AST 23  ALT 15*  ALKPHOS 26*  BILITOT 2.2*  ALBUMIN 2.4*    Cardiac Enzymes Recent Labs  Lab 03/22/18 1829 03/23/18 0520  TROPONINI <0.03 <0.03    Glucose Recent Labs  Lab 03/22/18 1248  GLUCAP 94    Imaging No results found.   STUDIES:  CT angiogram chest March 22, 2018 images independently reviewed showing a loculated and complicated appearing pleural effusion on the right.,  No PE  CULTURES: Pleural fluid 3/29 >>  ABF 3/29 >>  Fungal 3/29 >>   ANTIBIOTICS: None  SIGNIFICANT EVENTS:   LINES/TUBES: R pigtail chest tube 3/28 >>   DISCUSSION: 83 year old male with ischemic cardiomyopathy who has a complicated pleural effusion on the right causing chest pain.  The most likely  explanation for this effusion is that it is an empyema somehow related to the cholecystectomy he had in December.  Differential diagnosis includes silent aspiration.  ASSESSMENT / PLAN:  Complicated and loculated pleural effusion, likely empyema S/p Chest tube TPA 3/29  Pleural Fluid + rare GNR -micro call report 3/30  P:   Check CXR today .  Begin IV abx, follow cx data   Chest pain> doubt ischemia Ischemic cardiomyopathy P:  Follow     Angelgabriel Willmore NP-C  [facility]  [phone]   03/27/2018

## 2018-03-27 NOTE — Progress Notes (Signed)
Critical Result - Rare Gram neg rods growing in pleural fluid specimen sent yesterday. Rexene Edison, NP with Critical Care notified.

## 2018-03-27 NOTE — Plan of Care (Signed)
Pt O2 saturation has improved on room air since start of shift

## 2018-03-28 ENCOUNTER — Inpatient Hospital Stay (HOSPITAL_COMMUNITY): Payer: Medicare Other

## 2018-03-28 MED ORDER — ENOXAPARIN SODIUM 40 MG/0.4ML ~~LOC~~ SOLN: 40.0000 mg | SUBCUTANEOUS | Status: DC

## 2018-03-28 NOTE — Progress Notes (Signed)
[facility]                                                                              Patient Demographics  [name], is a 83 y.o. male, DOB - [birth year], DJM:[id number]  Admit date - 03/22/2018   Admitting Physician Thayer Headings, MD  Outpatient Primary MD for the patient is Burnard Bunting, MD  Outpatient specialists:   LOS - 6  days    Chief Complaint  Patient presents with  . Chest Pain       Brief summary  83 year old male with a past medical history significant for but not limited to CAD s/p cath 2012, MI and CABG (2229), chronic systolic HF, ischemic cardiomyopathy, ventricular tachycardia storm (currently controlled on Amiodarone), history of ICD, atrial fibrillation (not on anticoagulation d/t hx of subdural hematoma while on coumadin), s/p recent cholecystectomy in December 2018,  presenting with atypical chest pain( 03/22/2018), deemed non-sicemic by cardiology, and noted to have large loculated right pleural effusion on Chest CT felt to be empyema by pulmonary consultants.   Patient was seen by CVTS who felt that options include VATS for decortication and percutaneous drainage with intrapleural thrombolysis, but given his age and co-morbidities favored a trial of percutaneous drainage with thrombolytics.   He evntually underwent IR guided drain with catheter placement on 03/28/20119.  Patient is transferred to Hospitalist service for continued care today, 03/28/2018   Assessment & Plan    Active Problems:   Chest pain   Loculated pleural effusion   #1 Empyema: Felt to be related to recent cholecystectomy in 11/2017 Cont Chest Tube Cont Antibiotic Supportive Care CVTS and Pulmonary Consulting   #2 AKI: monitor renal fxn with electrolyes   #3 Moderate-Severe Protein cal Malnutrition: Nutritional support and optimization Dieatician/Nutritionist consult placed today  #4 Physical Deconditioning/debulity: Restorative  PT/OT  Code Status:  DVT Prophylaxis:  SCD's Family Communication: Discussed in detail with the patient, all imaging results, lab results explained to the patient and wife   Disposition Plan: to be determined  Time Spent in minutes  35 minutes  Procedures:  CT drain  Consultants:   Pulmonary CVTS  Antimicrobials:  Vanco and Zosyn   Medications  Scheduled Meds: . amiodarone  50 mg Oral Daily  . aspirin EC  81 mg Oral Daily  . B-complex with vitamin C  1 tablet Oral Daily  . carvedilol  6.25 mg Oral BID WC  . donepezil  5 mg Oral QHS  . fenofibrate  160 mg Oral Daily  . finasteride  5 mg Oral Daily  . levothyroxine  75 mcg Oral QAC breakfast  . lisinopril  5 mg Oral Daily  . multivitamin with minerals  1 tablet Oral Daily  . spironolactone  25 mg Oral Daily  . tamsulosin  0.4 mg Oral QHS   Continuous Infusions: . piperacillin-tazobactam (ZOSYN)  IV 3.375 g (03/28/18 1427)  . vancomycin 1,000 mg (03/28/18 1256)   PRN Meds:.acetaminophen, ALPRAZolam, methocarbamol, nitroGLYCERIN, ondansetron (ZOFRAN) IV, ondansetron, traMADol   Antibiotics   Anti-infectives (From admission, onward)   Start     Dose/Rate Route Frequency Ordered Stop  03/28/18 1400  vancomycin (VANCOCIN) IVPB 1000 mg/200 mL premix     1,000 mg 200 mL/hr over 60 Minutes Intravenous Every 24 hours 03/27/18 1345     03/27/18 1400  piperacillin-tazobactam (ZOSYN) IVPB 3.375 g     3.375 g 12.5 mL/hr over 240 Minutes Intravenous Every 8 hours 03/27/18 1342     03/27/18 1400  vancomycin (VANCOCIN) 1,500 mg in sodium chloride 0.9 % 500 mL IVPB     1,500 mg 250 mL/hr over 120 Minutes Intravenous  Once 03/27/18 1345 03/27/18 1830        Subjective:   [name] was seen and examined today.  Patient denies dizziness, chest pain, shortness of breath, abdominal pain, no nausea or vomitting Objective:   Vitals:   03/27/18 1932 03/28/18 0441 03/28/18 0800 03/28/18 1329  BP:  (!) 86/56  (!) 101/32   Pulse:  64  68  Resp:  18  18  Temp: 98.2 F (36.8 C) 99.1 F (37.3 C)  97.8 F (36.6 C)  TempSrc: Oral Oral  Oral  SpO2:  95% 97% 99%  Weight:  69.1 kg (152 lb 4.8 oz)    Height:        Intake/Output Summary (Last 24 hours) at 03/28/2018 1608 Last data filed at 03/28/2018 0636 Gross per 24 hour  Intake 650 ml  Output 248 ml  Net 402 ml     Wt Readings from Last 3 Encounters:  03/28/18 69.1 kg (152 lb 4.8 oz)  01/20/18 66.6 kg (146 lb 12.8 oz)  01/08/18 68.9 kg (152 lb)     Exam  General: NAD  HEENT: NCAT,  PERRL,MMM  Neck: SUPPLE, (-) JVD  Cardiovascular: RRR, (-) GALLOP, (-) MURMUR  Respiratory: Diminished BS, right lower lungfields  Gastrointestinal: SOFT, (-) DISTENSION, BS(+), (_) TENDERNESS  Ext: (-) CYANOSIS, (-) EDEMA  Neuro: A, OX 3  Skin:(-) RASH  Psych:NORMAL AFFECT/MOOD   Data Reviewed:  I have personally reviewed following labs and imaging studies  Micro Results Recent Results (from the past 240 hour(s))  Surgical pcr screen     Status: None   Collection Time: 03/23/18  4:41 PM  Result Value Ref Range Status   MRSA, PCR NEGATIVE NEGATIVE Final   Staphylococcus aureus NEGATIVE NEGATIVE Final    Comment: (NOTE) The Xpert SA Assay (FDA approved for NASAL specimens in patients 8 years of age and older), is one component of a comprehensive surveillance program. It is not intended to diagnose infection nor to guide or monitor treatment. Performed at [facility], [place] [address] [place], [place] [address]   Body fluid culture     Status: None (Preliminary result)   Collection Time: 03/26/18  2:36 PM  Result Value Ref Range Status   Specimen Description PLEURAL RIGHT  Final   Special Requests NONE  Final   Gram Stain   Final    FEW WBC PRESENT, PREDOMINANTLY PMN NO ORGANISMS SEEN    Culture   Final    RARE GRAM NEGATIVE RODS IDENTIFICATION AND SUSCEPTIBILITIES TO FOLLOW CRITICAL RESULT CALLED TO, READ BACK BY AND VERIFIED  WITH: C COOPER,RN AT 0950 03/27/18 BY L BENFIELD Performed at [facility], [place] [address] [place], [place] [address]    Report Status PENDING  Incomplete  Acid Fast Smear (AFB)     Status: None   Collection Time: 03/26/18  2:36 PM  Result Value Ref Range Status   AFB Specimen Processing Concentration  Final   Acid Fast  Smear Negative  Final    Comment: (NOTE) Performed At: [facility] [place], [place] [id number] Rush Farmer MD [id number]    Source (AFB) PLEURAL  Final    Comment: RIGHT Performed at [facility], [place] [address], [place], [place] [address]     Radiology Reports Dg Chest 2 View  Result Date: 03/28/2018 CLINICAL DATA:  Loculated pleural effusion EXAM: CHEST - 2 VIEW COMPARISON:  03/27/2018 FINDINGS: Right basilar chest tube in unchanged position. Persistent right pleural effusion. Right lower lobe airspace disease concerning for atelectasis. No left pleural effusion. Possible right apical pneumothorax. Stable cardiomediastinal silhouette. Prior CABG. Dual lead cardiac pacemaker. No acute osseous abnormality. Severe osteoarthritis of bilateral glenohumeral joints. IMPRESSION: 1. Right basilar chest tube in unchanged position. Persistent right pleural effusion and mild basilar atelectasis. Electronically Signed   By: Kathreen Devoid   On: 03/28/2018 11:18   Dg Chest 2 View  Result Date: 03/22/2018 CLINICAL DATA:  Chest pain radiating across the chest began today. History of previous MI, CHF, CABG. EXAM: CHEST - 2 VIEW COMPARISON:  PA and lateral chest x-ray of January 20, 2018 FINDINGS: There is a moderate-sized right pleural effusion. There is right basilar parenchymal density as well. The left lung is clear. The heart is top-normal in size. The pulmonary vascularity is not engorged. The ICD is in stable position. The sternal wires are intact. There is calcification in the wall of the aortic arch. There is multilevel degenerative disc  disease of the thoracic spine. IMPRESSION: New right pleural effusion. There is likely superimposed right basilar atelectasis or pneumonia. Top-normal cardiac size without overt pulmonary edema. Previous CABG. Thoracic aortic atherosclerosis. Electronically Signed   By: David  Martinique M.D.   On: 03/22/2018 12:26   Ct Chest Wo Contrast  Result Date: 03/22/2018 CLINICAL DATA:  Chest pain set a EXAM: CT CHEST WITHOUT CONTRAST TECHNIQUE: Multidetector CT imaging of the chest was performed following the standard protocol without IV contrast. COMPARISON:  Chest radiograph 03/22/2018 FINDINGS: Cardiovascular: Coronary, aortic arch, and branch vessel atherosclerotic vascular disease. Mild cardiomegaly. Calcification and pseudoaneurysm of the left ventricle indicating prior myocardial infarction. The right ventricular ICD lead is up near the pulmonary valve rather than at the right ventricular apex. However, this seems to be a chronic configuration in this patient. Prior CABG. Severe bilateral degenerative glenohumeral arthropathy. Mediastinum/Nodes: Unremarkable Lungs/Pleura: Moderate-sized loculated right pleural effusion. Atelectasis of most of the right lower lobe with loculated fluid collections along the fissures. There is some atelectasis in the right middle lobe as well. Trace left pleural effusion with some fluid tracking along the left major fissure. Upper Abdomen: Borderline elevated right hemidiaphragm. Cholecystectomy. Dense atherosclerotic calcification in the celiac artery. Stable rim calcified structure in the right upper abdomen adjacent to the IVC and liver on image 131/3. Musculoskeletal: Thoracic spondylosis. IMPRESSION: 1. Moderate-sized loculated right pleural effusion. Small left pleural effusion. Passive atelectasis in the right lung. 2. Calcification along a pseudoaneurysm of the left ventricle indicating prior myocardial infarction. 3. Chronic borderline malpositioning of the right ventricular  ICD lead. Instead of being along the right ventricular apex it is up near the pulmonary valve. 4. Mild cardiomegaly. 5. Borderline elevated right hemidiaphragm. 6. Thoracic spondylosis. 7.  Aortic Atherosclerosis (ICD10-I70.0).  Prior CABG. Electronically Signed   By: Van Clines M.D.   On: 03/22/2018 19:07   Ct Angio Chest Pe W Or Wo Contrast  Result Date: 03/23/2018 CLINICAL DATA:  Chest pain.  Elevated D-dimer. EXAM:  CT ANGIOGRAPHY CHEST WITH CONTRAST TECHNIQUE: Multidetector CT imaging of the chest was performed using the standard protocol during bolus administration of intravenous contrast. Multiplanar CT image reconstructions and MIPs were obtained to evaluate the vascular anatomy. CONTRAST:  56mL ISOVUE-370 IOPAMIDOL (ISOVUE-370) INJECTION 76% COMPARISON:  03/22/2018 FINDINGS: Cardiovascular: Pulmonary arterial opacification is good. There are no pulmonary emboli. Heart size is upper limits of normal. Calcified left ventricular aneurysm. There has been previous median sternotomy and CABG. The aorta shows atherosclerosis but no aneurysm or dissection. Mediastinum/Nodes: No mediastinal mass or lymphadenopathy. Lungs/Pleura: Worsening loculated pleural fluid collections on the right worrisome for empyema. Tiny dot of air or gas within a medial loculation. No pleural fluid on the left. There is compressive atelectasis and or pneumonia the right lung, particularly in the lower lung. There is mild dependent atelectasis or patchy pneumonia at the left base. Upper Abdomen: No acute finding. Chronic benign intraperitoneal calcifications not of acute significance. Musculoskeletal: Bridging osteophytes of the spine with ankylosis. No acute bone finding. Review of the MIP images confirms the above findings. IMPRESSION: Worsening of loculated pleural fluid collections in the right hemithorax worrisome for empyema. Associated compressive atelectasis and or pneumonia of the right lung. Mild patchy atelectasis and  or pneumonia at the left base. No sign of pulmonary emboli. Aortic Atherosclerosis (ICD10-I70.0). Electronically Signed   By: Nelson Chimes M.D.   On: 03/23/2018 11:23   Ir Guided Niel Hummer W Catheter Placement  Result Date: 03/25/2018 INDICATION: Right pleural effusion EXAM: RIGHT THORACOSTOMY MEDICATIONS: The patient is currently admitted to the hospital and receiving intravenous antibiotics. The antibiotics were administered within an appropriate time frame prior to the initiation of the procedure. ANESTHESIA/SEDATION: Fentanyl 25 mcg IV; Versed 0.5 mg IV Moderate Sedation Time:  10 minutes The patient was continuously monitored during the procedure by the interventional radiology nurse under my direct supervision. COMPLICATIONS: None immediate. PROCEDURE: Informed written consent was obtained from the patient after a thorough discussion of the procedural risks, benefits and alternatives. All questions were addressed. Maximal Sterile Barrier Technique was utilized including caps, mask, sterile gowns, sterile gloves, sterile drape, hand hygiene and skin antiseptic. A timeout was performed prior to the initiation of the procedure. Under sonographic guidance, a micropuncture needle was inserted into the right pleural effusion the mid axillary line approach. It was removed over a 018 wire which was up sized to an Amplatz. A 12 French pigtail drain was advanced over the wire and coiled in the fluid collection. It was looped and string fixed then sewn to the skin. Cloudy yellow fluid was aspirated. It was attached to a pleura vac at 20 cm water suction. FINDINGS: Images confirm 65 French right thoracostomy placement in the right pleural effusion. IMPRESSION: Successful 12 French right thoracostomy tube placement. Electronically Signed   By: Marybelle Killings M.D.   On: 03/25/2018 16:02   Dg Chest Port 1 View  Result Date: 03/26/2018 CLINICAL DATA:  Follow-up chest tube drainage of pleural effusion. EXAM: PORTABLE CHEST 1  VIEW COMPARISON:  03/22/2018 FINDINGS: Previous median sternotomy and CABG. Pacemaker/AICD. Left chest remains clear. Pigtail drainage catheter in the pleural space at the right base. Diminished but persistent right pleural density with volume loss and/or infiltrate in the right lower lung. No pneumothorax. IMPRESSION: Right pleural drainage catheter in place. Persistent right pleural density with dependent pulmonary atelectasis/infiltrate. No pneumothorax. Electronically Signed   By: Nelson Chimes M.D.   On: 03/26/2018 07:54   Dg Chest Port 1v Same Day  Result  Date: 03/27/2018 CLINICAL DATA:  Pleural effusion.  Right-sided chest pain EXAM: PORTABLE CHEST 1 VIEW COMPARISON:  Yesterday FINDINGS: Drainage catheter at the right base. Interval decrease in right-sided pleural effusion. No re-expansion edema. No visible pneumothorax. The left lung is comparatively clear. Mild atelectasis or scarring at the left base. Dual-chamber pacer from the left. Status post CABG. IMPRESSION: Decreased right pleural effusion.  No new abnormality. Electronically Signed   By: Monte Fantasia M.D.   On: 03/27/2018 13:09    Lab Data:  CBC: Recent Labs  Lab 03/22/18 1051 03/23/18 0520 03/27/18 0431  WBC 10.3 8.6 16.2*  HGB 12.1* 10.9* 12.6*  HCT 37.0* 34.1* 39.3  MCV 94.4 94.5 92.7  PLT 244 244 854   Basic Metabolic Panel: Recent Labs  Lab 03/22/18 1051 03/22/18 1829 03/23/18 0520 03/27/18 0431  NA 138  --  136 135  K 4.2  --  4.0 4.3  CL 107  --  105 102  CO2 22  --  19* 25  GLUCOSE 113*  --  119* 152*  BUN 22*  --  18 25*  CREATININE 1.45*  --  1.23 1.52*  CALCIUM 8.6*  --  8.2* 8.4*  MG  --  1.9  --   --    GFR: Estimated Creatinine Clearance: 37.9 mL/min (A) (by C-G formula based on SCr of 1.52 mg/dL (H)). Liver Function Tests: Recent Labs  Lab 03/23/18 0520 03/25/18 1611  AST 23  --   ALT 15*  --   ALKPHOS 26*  --   BILITOT 2.2*  --   PROT 6.1* 6.0*  ALBUMIN 2.4*  --    No results for  input(s): LIPASE, AMYLASE in the last 168 hours. No results for input(s): AMMONIA in the last 168 hours. Coagulation Profile: Recent Labs  Lab 03/22/18 1829  INR 1.39   Cardiac Enzymes: Recent Labs  Lab 03/22/18 1829 03/23/18 0520  TROPONINI <0.03 <0.03   BNP (last 3 results) No results for input(s): PROBNP in the last 8760 hours. HbA1C: No results for input(s): HGBA1C in the last 72 hours. CBG: Recent Labs  Lab 03/22/18 1248  GLUCAP 94   Lipid Profile: No results for input(s): CHOL, HDL, LDLCALC, TRIG, CHOLHDL, LDLDIRECT in the last 72 hours. Thyroid Function Tests: No results for input(s): TSH, T4TOTAL, FREET4, T3FREE, THYROIDAB in the last 72 hours. Anemia Panel: No results for input(s): VITAMINB12, FOLATE, FERRITIN, TIBC, IRON, RETICCTPCT in the last 72 hours. Urine analysis:    Component Value Date/Time   COLORURINE YELLOW 12/21/2017 1038   APPEARANCEUR CLEAR 12/21/2017 1038   LABSPEC 1.010 12/21/2017 1038   PHURINE 6.5 12/21/2017 1038   GLUCOSEU NEGATIVE 12/21/2017 1038   HGBUR NEGATIVE 12/21/2017 [place] 12/21/2017 1038   KETONESUR NEGATIVE 12/21/2017 1038   PROTEINUR NEGATIVE 12/21/2017 1038   NITRITE NEGATIVE 12/21/2017 1038   LEUKOCYTESUR NEGATIVE 12/21/2017 1038     Benito Mccreedy M.D. [facility] 03/28/2018, 4:08 PM  Pager: [phone] Between 7am to 7pm - call Pager - [phone]  After 7pm go to [email or web address] - password [email or web address]  Call night coverage person covering after 7pm

## 2018-03-28 NOTE — Progress Notes (Signed)
Discussed with Dr. Vista Lawman as pulmonary saw patient yesterday but tried hospitalists were supposed to assume care but this did not occur.  He will see patient today and assume care of patient

## 2018-03-29 ENCOUNTER — Inpatient Hospital Stay (HOSPITAL_COMMUNITY): Payer: Medicare Other

## 2018-03-29 DIAGNOSIS — I5032 Chronic diastolic (congestive) heart failure: Secondary | ICD-10-CM

## 2018-03-29 DIAGNOSIS — R072 Precordial pain: Secondary | ICD-10-CM

## 2018-03-29 DIAGNOSIS — E039 Hypothyroidism, unspecified: Secondary | ICD-10-CM | POA: Diagnosis present

## 2018-03-29 DIAGNOSIS — E43 Unspecified severe protein-calorie malnutrition: Secondary | ICD-10-CM | POA: Diagnosis present

## 2018-03-29 LAB — CBC WITH DIFFERENTIAL/PLATELET
BASOS ABS: 0 10*3/uL (ref 0.0–0.1)
BASOS PCT: 0 %
EOS ABS: 0.1 10*3/uL (ref 0.0–0.7)
Eosinophils Relative: 1 %
HCT: 37 % — ABNORMAL LOW (ref 39.0–52.0)
HEMOGLOBIN: 12 g/dL — AB (ref 13.0–17.0)
LYMPHS PCT: 24 %
Lymphs Abs: 3.3 10*3/uL (ref 0.7–4.0)
MCH: 30.2 pg (ref 26.0–34.0)
MCHC: 32.4 g/dL (ref 30.0–36.0)
MCV: 93.2 fL (ref 78.0–100.0)
MONO ABS: 1 10*3/uL (ref 0.1–1.0)
Monocytes Relative: 7 %
Neutro Abs: 9.2 10*3/uL — ABNORMAL HIGH (ref 1.7–7.7)
Neutrophils Relative %: 68 %
PLATELETS: 400 10*3/uL (ref 150–400)
RBC: 3.97 MIL/uL — ABNORMAL LOW (ref 4.22–5.81)
RDW: 14.5 % (ref 11.5–15.5)
WBC: 13.6 10*3/uL — ABNORMAL HIGH (ref 4.0–10.5)

## 2018-03-29 LAB — COMPREHENSIVE METABOLIC PANEL
ALBUMIN: 2 g/dL — AB (ref 3.5–5.0)
ALK PHOS: 32 U/L — AB (ref 38–126)
ALT: 59 U/L (ref 17–63)
ANION GAP: 7 (ref 5–15)
AST: 55 U/L — ABNORMAL HIGH (ref 15–41)
BUN: 22 mg/dL — ABNORMAL HIGH (ref 6–20)
CHLORIDE: 101 mmol/L (ref 101–111)
CO2: 25 mmol/L (ref 22–32)
Calcium: 8.2 mg/dL — ABNORMAL LOW (ref 8.9–10.3)
Creatinine, Ser: 1.24 mg/dL (ref 0.61–1.24)
GFR calc non Af Amer: 53 mL/min — ABNORMAL LOW (ref >60)
GLUCOSE: 105 mg/dL — AB (ref 65–99)
Potassium: 4.5 mmol/L (ref 3.5–5.1)
SODIUM: 133 mmol/L — AB (ref 135–145)
Total Bilirubin: 1 mg/dL (ref 0.3–1.2)
Total Protein: 5.7 g/dL — ABNORMAL LOW (ref 6.5–8.1)

## 2018-03-29 LAB — BODY FLUID CULTURE

## 2018-03-29 LAB — PREALBUMIN: PREALBUMIN: 10.7 mg/dL — AB (ref 18–38)

## 2018-03-29 MED ORDER — SODIUM CHLORIDE 0.9 % IJ SOLN
20.0000 mg | Freq: Once | INTRAMUSCULAR | Status: AC
  Administered 2018-03-29: 20 mg via INTRAPLEURAL
  Filled 2018-03-29: qty 20

## 2018-03-29 MED ORDER — BOOST PLUS PO LIQD
237.0000 mL | Freq: Three times a day (TID) | ORAL | Status: DC
  Administered 2018-03-29 – 2018-03-31 (×7): 237 mL via ORAL
  Filled 2018-03-29 (×12): qty 237

## 2018-03-29 MED ORDER — CEFTRIAXONE SODIUM 2 G IJ SOLR
2.0000 g | INTRAMUSCULAR | Status: DC
Start: 2018-03-29 — End: 2018-03-31
  Administered 2018-03-29 – 2018-03-30 (×2): 2 g via INTRAVENOUS
  Filled 2018-03-29 (×3): qty 20

## 2018-03-29 MED ORDER — STERILE WATER FOR INJECTION IJ SOLN
5.0000 mg | Freq: Once | RESPIRATORY_TRACT | Status: AC
  Administered 2018-03-29: 5 mg via INTRAPLEURAL
  Filled 2018-03-29: qty 5

## 2018-03-29 NOTE — Progress Notes (Addendum)
PROGRESS NOTE    [name]  WJX:[id number] DOB: [birth year] DOA: 03/22/2018 PCP: Burnard Bunting, MD      Brief Narrative:  [name] is an 83 y.o. M with CAD s/p CABG and PCI last 2012, CHF EF 25% ICD in place, AFib not on AC due to [place], CKD II baseline 1.1, hx VT on amio, HTN, and hypothyroidism who presents with chest pain, admitted to Cardiology service, found to have loculated pleural effusion by CT and CTA chest without pneumonia or PE.  Pulmonology and CVTS were consulted, recommended trial of perc drain + Pulmozyme/tPA which was done by IR on 3/28.  Had recent admission for laparoscopic cholecystectomy, hospitalization prolonged by pre-op Afib with RVR and post-op ICD shock.   Assessment & Plan:  Empyema Culture with GNRs, ID and sens pending.  No fever since presentation.    Output from chest tube 1.7L first day, tapered off dramatically since then.  Discussed with Pulm. -Continue vancomycin, Zosyn day 3 -Follow culture data -Appreciate Pulm recommendations   Chronic systolic CHF Coronary artery disease Hypertension -Continue BB, ACEi, Arlyce Harman -Continue aspirin, fibrate  History VT -Continue amiodarone  Atrial fibrillation, permanent CHADS2Vasc 5, not on AC due to hx IC bleed. -Continue amiodarone, BB  Hypothyroidism -Continue levothyroxine  Renal insufficiency Basleine Cr 1.1, somewhat up this hospitalization to 1.5.    Dementia -Continue donepezil  BPH  -Continue Flomax  Protein calorie malnutrition, severe -Add Boost TID    DVT prophylaxis: SCDs Code Status: FULL Family Communication: Friend at b[place]ide MDM and disposition Plan: The below labs and imaging reports were reviewed.  The patient's status is clinically stable.  He presented with chest pain, found to have empyema, now s/p chest tube.  Chest tube remains in place, will likely be instilled with dornase alfa and tpA again, repeat imaging per Pulmonology.  If no significant  improvement, will be re-eval'd by CT surgery.  PT eval pending.     Consultants:   Cardiology  Pulmonary  CT surgery  IR  Procedures:   CT chest 3/26  CTA chest 3/26  IR guided perc drain 3/28  Antimicrobials:   Vancomycin 3/30 >>  Zosyn 3/30 >>    Subjective: Feels well.  No fever, cough, sputum.  Drain output tapering.  Mild chest pain, not severe.  No confusion, leg ewakness.  Objective: Vitals:   03/28/18 1329 03/28/18 1919 03/29/18 0427 03/29/18 1338  BP: (!) 101/32 116/70 (!) 102/54   Pulse: 68 80 68 68  Resp: 18 19 17    Temp: 97.8 F (36.6 C) 98.9 F (37.2 C) 99.1 F (37.3 C) 98 F (36.7 C)  TempSrc: Oral Oral Oral Oral  SpO2: 99% 95% 96%   Weight:   68.1 kg (150 lb 1.6 oz)   Height:        Intake/Output Summary (Last 24 hours) at 03/29/2018 1433 Last data filed at 03/29/2018 1300 Gross per 24 hour  Intake 1070 ml  Output 584 ml  Net 486 ml   Filed Weights   03/27/18 0525 03/28/18 0441 03/29/18 0427  Weight: 66.2 kg (146 lb) 69.1 kg (152 lb 4.8 oz) 68.1 kg (150 lb 1.6 oz)    Examination: General appearance: Thin elderly adult male, alert and in no acutedistress, sitting on side of bed.   HEENT: Anicteric, conjunctiva pink, lids and lashes normal. No nasal deformity, discharge, epistaxis.  Lips moist.  OP moist, no lesions.   Skin: Warm and dry.  No jaundice.  No suspicious rashes  or lesions.  No ecchymosis or swelling around chest tube, no crepitus. Cardiac: RRR, nl S1-S2, no murmurs appreciated.  Capillary refill is brisk.  JVP normal.  No LE edema.  Radial pulses 2+ and symmetric. Respiratory: Normal respiratory rate and rhythm.  CTAB without rales or wheezes.  Diminished on right base. Abdomen: Abdomen soft.  No TTP. No ascites, distension, hepatosplenomegaly.   MSK: No deformities or effusions. Neuro: Awake and alert.  EOMI, moves all extremities. Speech fluent.    Psych: Sensorium intact and responding to questions, attention normal.  Affect normal.  Judgment and insight appear normal.    Data Reviewed: I have personally reviewed following labs and imaging studies:  CBC: Recent Labs  Lab 03/23/18 0520 03/27/18 0431 03/29/18 0509  WBC 8.6 16.2* 13.6*  NEUTROABS  --   --  9.2*  HGB 10.9* 12.6* 12.0*  HCT 34.1* 39.3 37.0*  MCV 94.5 92.7 93.2  PLT 244 352 161   Basic Metabolic Panel: Recent Labs  Lab 03/22/18 1829 03/23/18 0520 03/27/18 0431 03/29/18 0509  NA  --  136 135 133*  K  --  4.0 4.3 4.5  CL  --  105 102 101  CO2  --  19* 25 25  GLUCOSE  --  119* 152* 105*  BUN  --  18 25* 22*  CREATININE  --  1.23 1.52* 1.24  CALCIUM  --  8.2* 8.4* 8.2*  MG 1.9  --   --   --    GFR: Estimated Creatinine Clearance: 45.8 mL/min (by C-G formula based on SCr of 1.24 mg/dL). Liver Function Tests: Recent Labs  Lab 03/23/18 0520 03/25/18 1611 03/29/18 0509  AST 23  --  55*  ALT 15*  --  59  ALKPHOS 26*  --  32*  BILITOT 2.2*  --  1.0  PROT 6.1* 6.0* 5.7*  ALBUMIN 2.4*  --  2.0*   No results for input(s): LIPASE, AMYLASE in the last 168 hours. No results for input(s): AMMONIA in the last 168 hours. Coagulation Profile: Recent Labs  Lab 03/22/18 1829  INR 1.39   Cardiac Enzymes: Recent Labs  Lab 03/22/18 1829 03/23/18 0520  TROPONINI <0.03 <0.03   BNP (last 3 results) No results for input(s): PROBNP in the last 8760 hours. HbA1C: No results for input(s): HGBA1C in the last 72 hours. CBG: No results for input(s): GLUCAP in the last 168 hours. Lipid Profile: No results for input(s): CHOL, HDL, LDLCALC, TRIG, CHOLHDL, LDLDIRECT in the last 72 hours. Thyroid Function Tests: No results for input(s): TSH, T4TOTAL, FREET4, T3FREE, THYROIDAB in the last 72 hours. Anemia Panel: No results for input(s): VITAMINB12, FOLATE, FERRITIN, TIBC, IRON, RETICCTPCT in the last 72 hours. Urine analysis:    Component Value Date/Time   COLORURINE YELLOW 12/21/2017 1038   APPEARANCEUR CLEAR 12/21/2017 1038    LABSPEC 1.010 12/21/2017 1038   PHURINE 6.5 12/21/2017 1038   GLUCOSEU NEGATIVE 12/21/2017 1038   HGBUR NEGATIVE 12/21/2017 1038   [place] 12/21/2017 1038   KETONESUR NEGATIVE 12/21/2017 1038   PROTEINUR NEGATIVE 12/21/2017 1038   NITRITE NEGATIVE 12/21/2017 1038   LEUKOCYTESUR NEGATIVE 12/21/2017 1038   Sepsis Labs: @LABRCNTIP (procalcitonin:4,lacticacidven:4)  ) Recent Results (from the past 240 hour(s))  Surgical pcr screen     Status: None   Collection Time: 03/23/18  4:41 PM  Result Value Ref Range Status   MRSA, PCR NEGATIVE NEGATIVE Final   Staphylococcus aureus NEGATIVE NEGATIVE Final    Comment: (NOTE) The Xpert SA  Assay (FDA approved for NASAL specimens in patients 20 years of age and older), is one component of a comprehensive surveillance program. It is not intended to diagnose infection nor to guide or monitor treatment. Performed at [facility], [place] [address], [place], [place] [address]   Body fluid culture     Status: None   Collection Time: 03/26/18  2:36 PM  Result Value Ref Range Status   Specimen Description PLEURAL RIGHT  Final   Special Requests NONE  Final   Gram Stain   Final    FEW WBC PRESENT, PREDOMINANTLY PMN NO ORGANISMS SEEN    Culture   Final    RARE ENTEROBACTER CLOACAE CRITICAL RESULT CALLED TO, READ BACK BY AND VERIFIED WITH: C COOPER,RN AT 0950 03/27/18 BY L BENFIELD Performed at [facility], [place] [address] [place], [place] [address]    Report Status 03/29/2018 FINAL  Final   Organism ID, Bacteria ENTEROBACTER CLOACAE  Final      Susceptibility   Enterobacter cloacae - MIC*    CEFAZOLIN >=64 RESISTANT Resistant     CEFEPIME <=1 SENSITIVE Sensitive     CEFTAZIDIME <=1 SENSITIVE Sensitive     CEFTRIAXONE <=1 SENSITIVE Sensitive     CIPROFLOXACIN 0.5 SENSITIVE Sensitive     GENTAMICIN <=1 SENSITIVE Sensitive     IMIPENEM 0.5 SENSITIVE Sensitive     TRIMETH/SULFA <=20 SENSITIVE Sensitive     PIP/TAZO 8  SENSITIVE Sensitive     * RARE ENTEROBACTER CLOACAE  Acid Fast Smear (AFB)     Status: None   Collection Time: 03/26/18  2:36 PM  Result Value Ref Range Status   AFB Specimen Processing Concentration  Final   Acid Fast Smear Negative  Final    Comment: (NOTE) Performed At: [facility] [address] [place], [place] [id number] Rush Farmer MD IP:[id number]    Source (AFB) PLEURAL  Final    Comment: RIGHT Performed at [facility], [place] [address] [place], [place] [address]          Radiology Studies: Dg Chest 2 View  Result Date: 03/29/2018 CLINICAL DATA:  83 year old male status post right chest tube placed for loculated right pleural effusion. EXAM: CHEST - 2 VIEW COMPARISON:  03/28/2018 and earlier. FINDINGS: PA and lateral views. Stable right lateral pigtail right pleural catheter. Evidence of right pleural effusion drainage since 03/22/2018 but residual Patchy and confluent right lung base opacity. No pneumothorax. Stable lung markings and ventilation elsewhere. Stable cardiac size and mediastinal contours. Prior CABG. Left chest cardiac AICD. No acute osseous abnormality identified. Negative visible bowel gas pattern. IMPRESSION: 1. Stable right pigtail pleural drain. Regressed right pleural effusion since 03/22/2018, but residual Patchy and confluent right lung base opacity. No pneumothorax. 2. No new cardiopulmonary abnormality. Electronically Signed   By: Genevie Ann M.D.   On: 03/29/2018 09:19   Dg Chest 2 View  Result Date: 03/28/2018 CLINICAL DATA:  Loculated pleural effusion EXAM: CHEST - 2 VIEW COMPARISON:  03/27/2018 FINDINGS: Right basilar chest tube in unchanged position. Persistent right pleural effusion. Right lower lobe airspace disease concerning for atelectasis. No left pleural effusion. Possible right apical pneumothorax. Stable cardiomediastinal silhouette. Prior CABG. Dual lead cardiac pacemaker. No acute osseous abnormality. Severe osteoarthritis of  bilateral glenohumeral joints. IMPRESSION: 1. Right basilar chest tube in unchanged position. Persistent right pleural effusion and mild basilar atelectasis. Electronically Signed   By: Kathreen Devoid   On: 03/28/2018 11:18  Scheduled Meds: . amiodarone  50 mg Oral Daily  . aspirin EC  81 mg Oral Daily  . B-complex with vitamin C  1 tablet Oral Daily  . carvedilol  6.25 mg Oral BID WC  . donepezil  5 mg Oral QHS  . fenofibrate  160 mg Oral Daily  . finasteride  5 mg Oral Daily  . lactose free nutrition  237 mL Oral TID WC  . levothyroxine  75 mcg Oral QAC breakfast  . lisinopril  5 mg Oral Daily  . multivitamin with minerals  1 tablet Oral Daily  . spironolactone  25 mg Oral Daily  . tamsulosin  0.4 mg Oral QHS   Continuous Infusions: . piperacillin-tazobactam (ZOSYN)  IV 3.375 g (03/29/18 1331)  . vancomycin 1,000 mg (03/29/18 1336)     LOS: 7 days    Time spent: 25 minutes    Edwin Dada, MD [facility] 03/29/2018, 2:33 PM     Pager [phone] --- please page though AMION:  [email or web address] If 7PM-7AM, please contact night-coverage

## 2018-03-29 NOTE — Progress Notes (Signed)
PULMONARY / CRITICAL CARE MEDICINE   Name: [name] MRN: [id number] DOB: [birth year]    ADMISSION DATE:  03/22/2018 CONSULTATION DATE: March 23, 2018  REFERRING MD: Dr. Acie Fredrickson  CHIEF COMPLAINT: Chest pain  HISTORY OF PRESENT ILLNESS:   83 year old male with a past medical history significant for coronary artery disease, systolic heart failure, and gallbladder disease requiring a cholecystectomy in December 2018 who returns to our facility with chest pain.  His wife provides most of the history at his request.  She says that he has been stable for the last several weeks.  After his hospitalization in December 2018 he felt fairly weak but had no persistent nausea or vomiting after discharge.  He was discharged on O2, 2 L to be used on exertion which he used briefly.  His appetite improved and his exercise tolerance improved and she says he was back to walking 2-3 miles a day.  He had no respiratory complaint during that time (January February and early March).  No cough, no wheeze, no chest pain, no hemoptysis.  He lost approximately 20 pounds around the time of his cholecystectomy.  However yesterday he developed the sudden onset of a constant chest pain which is dull in character and substernal.  The pain has now resolved.  This has not been associated with fevers or chills.  He was admitted to the cardiology service and the clinical impression is that the pain is not due to ischemia.  A CT scan of his chest was performed yesterday showing a large loculated and complicated pleural effusion on the right.   SUBJECTIVE:   Pt with chest tube TPA administration 3/29 with good drainage of effusion (1.7L in first day but since tapered off).  Has done well since then but has small residual fluid left still as of 4/1.  States he felt "much better" after first instillation of tPA / pulmozyme.   VITAL SIGNS: BP (!) 102/54 (BP Location: Right Arm)   Pulse 68   Temp 98 F (36.7 C) (Oral)   Resp 17    Ht 5\' 11"  (1.803 m)   Wt 68.1 kg (150 lb 1.6 oz)   SpO2 96%   BMI 20.93 kg/m   HEMODYNAMICS:    VENTILATOR SETTINGS:    INTAKE / OUTPUT: I/O last 3 completed shifts: In: 1240 [P.O.:1140; IV Piggyback:100] Out: 614 [Urine:610; Chest Tube:4]  PHYSICAL EXAMINATION: General:  Thin male lying in bed, in NAD HEENT:  [place] / AT, MMM PULM: Decreased BS in right base, Pigtail in place on right , no air leak CV: RRR, No m/r/g GI:  BS + , NT  MSK: intact /no edema  Neuro: a/o x 3 , no focal deficits noted   LABS:  BMET Recent Labs  Lab 03/23/18 0520 03/27/18 0431 03/29/18 0509  NA 136 135 133*  K 4.0 4.3 4.5  CL 105 102 101  CO2 19* 25 25  BUN 18 25* 22*  CREATININE 1.23 1.52* 1.24  GLUCOSE 119* 152* 105*    Electrolytes Recent Labs  Lab 03/22/18 1829 03/23/18 0520 03/27/18 0431 03/29/18 0509  CALCIUM  --  8.2* 8.4* 8.2*  MG 1.9  --   --   --     CBC Recent Labs  Lab 03/23/18 0520 03/27/18 0431 03/29/18 0509  WBC 8.6 16.2* 13.6*  HGB 10.9* 12.6* 12.0*  HCT 34.1* 39.3 37.0*  PLT 244 352 400    Coag's Recent Labs  Lab 03/22/18 1829  INR 1.39  Sepsis Markers No results for input(s): LATICACIDVEN, PROCALCITON, O2SATVEN in the last 168 hours.  ABG No results for input(s): PHART, PCO2ART, PO2ART in the last 168 hours.  Liver Enzymes Recent Labs  Lab 03/23/18 0520 03/29/18 0509  AST 23 55*  ALT 15* 59  ALKPHOS 26* 32*  BILITOT 2.2* 1.0  ALBUMIN 2.4* 2.0*    Cardiac Enzymes Recent Labs  Lab 03/22/18 1829 03/23/18 0520  TROPONINI <0.03 <0.03    Glucose No results for input(s): GLUCAP in the last 168 hours.  Imaging Dg Chest 2 View  Result Date: 03/29/2018 CLINICAL DATA:  83 year old male status post right chest tube placed for loculated right pleural effusion. EXAM: CHEST - 2 VIEW COMPARISON:  03/28/2018 and earlier. FINDINGS: PA and lateral views. Stable right lateral pigtail right pleural catheter. Evidence of right pleural  effusion drainage since 03/22/2018 but residual Patchy and confluent right lung base opacity. No pneumothorax. Stable lung markings and ventilation elsewhere. Stable cardiac size and mediastinal contours. Prior CABG. Left chest cardiac AICD. No acute osseous abnormality identified. Negative visible bowel gas pattern. IMPRESSION: 1. Stable right pigtail pleural drain. Regressed right pleural effusion since 03/22/2018, but residual Patchy and confluent right lung base opacity. No pneumothorax. 2. No new cardiopulmonary abnormality. Electronically Signed   By: Genevie Ann M.D.   On: 03/29/2018 09:19     STUDIES:  CT angiogram chest March 22, 2018 images independently reviewed showing a loculated and complicated appearing pleural effusion on the right.,  No PE  CULTURES: Pleural fluid 3/29 >> enterobacter cloacae (resistant to cefazolin). ABF 3/29 >>  Fungal 3/29 >>   ANTIBIOTICS: Vanc 3/30 > 4/1 Zosyn 3/30 > 4/1 Ceftriaxone 4/1 >   SIGNIFICANT EVENTS: 3/29 > intrapleural tPA / pulmozyme  LINES/TUBES: R pigtail chest tube 3/28 >>   DISCUSSION: 83 year old male with ischemic cardiomyopathy who has a complicated pleural effusion on the right causing chest pain.  The most likely explanation for this effusion is that it is an empyema somehow related to the cholecystectomy he had in December.  Differential diagnosis includes silent aspiration. Had chest tube placed and pleural fluid sent for cultures.  This returned positive for enterobacter cloacae which most likely stems from his recent cholecystitis. Required tPA / pulmozyme on 3/29 and had great response.  Planning to repeat 4/1.  ASSESSMENT / PLAN:  Complicated and loculated pleural effusion / empyema - Enterobacter Cloacae (cefazolin resistance). S/p Chest tube TPA 3/29  P: Narrow abx to ceftriaxone. Will repeat tPA / pulmozyme today to facilitate further drainage. Repeat CXR in AM.  Chest pain> doubt ischemia Ischemic  cardiomyopathy P:  Follow   Rest per primary.   Montey Hora, [facility] Pager: [phone]  or [phone] 03/29/2018, 3:13 PM

## 2018-03-29 NOTE — Progress Notes (Signed)
Pharmacy Antibiotic Note  ANSHUL MEDDINGS is a 83 y.o. male admitted on 03/22/2018 with pneumonia.  Pharmacy was consulted for vancomycin and Zosyn dosing and now change to rocephin (pleural fluid with enterobacter; sensitive to rocephin)   Plan: -Rocephin 2gm IV q24h -Anticipate no dose changes Will sign off. Please contact pharmacy with any other needs.  Thank you Hildred Laser, PharmD 03/29/2018 2:55 PM

## 2018-03-29 NOTE — Progress Notes (Signed)
PCCM Interval Progress Note  20mg  intrapleural tPA and 5mg  pulmozyme administered through right sided chest tube and tube clamped.  Dwell time planned for 1 hour.  RN asked to unclamp tube after 1 hour dwell time (at 1700) and connect [place] back to 20cm suction.   Montey Hora, [facility] Pager: [phone]  or [phone] 03/29/2018, 4:16 PM

## 2018-03-29 NOTE — Progress Notes (Signed)
Initial Nutrition Assessment  DOCUMENTATION CODES:   Severe malnutrition in context of chronic illness  INTERVENTION:    Boost Plus PO TID, each supplement provides 360 kcal and 14 gm protein  NUTRITION DIAGNOSIS:   Severe Malnutrition related to chronic illness(CHF, cardiomyopathy) as evidenced by severe muscle depletion, severe fat depletion, percent weight loss(9% weight loss in 3 months).  GOAL:   Patient will meet greater than or equal to 90% of their needs  MONITOR:   PO intake, Supplement acceptance, Labs, Skin  REASON FOR ASSESSMENT:   Consult Assessment of nutrition requirement/status  ASSESSMENT:   83 yo male with PMH of MI, ischemic cardiomyopathy, HTN, v tach, v fib, HLD, SDH, a fib, CHF, AICD, and hypothyroidism who was admitted on 3/25 with chest pain; found to have R empyema, S/P R chest tube placement 3/28.  Patient reports usual weight 165 lbs 3 months ago. He had gall bladder surgery iin December and lost weight during that hospitalization, but had gained it back. Now down to 150 lbs. He typically eats a good breakfast and a good supper, but not much during the day. He had been drinking Boost Plus supplements at home PTA. Now with a good appetite; has been consuming 50-100% of meals.  9% weight loss within the past 3 months is significant for the time frame.    NUTRITION - FOCUSED PHYSICAL EXAM:    Most Recent Value  Orbital Region  Severe depletion  Upper Arm Region  Mild depletion  Thoracic and Lumbar Region  Severe depletion  Buccal Region  Severe depletion  Temple Region  Severe depletion  Clavicle Bone Region  Severe depletion  Clavicle and Acromion Bone Region  Moderate depletion  Scapular Bone Region  Moderate depletion  Dorsal Hand  Severe depletion  Patellar Region  Moderate depletion  Anterior Thigh Region  Moderate depletion  Posterior Calf Region  Moderate depletion  Edema (RD Assessment)  None  Hair  Reviewed  Eyes  Reviewed  Mouth   Reviewed  Skin  Reviewed  Nails  Reviewed       Diet Order:  Diet Heart Room service appropriate? Yes; Fluid consistency: Thin  EDUCATION NEEDS:   No education needs have been identified at this time  Skin:  Skin Assessment: Reviewed RN Assessment  Last BM:  4/1  Height:   Ht Readings from Last 1 Encounters:  03/23/18 5\' 11"  (1.803 m)    Weight:   Wt Readings from Last 1 Encounters:  03/29/18 150 lb 1.6 oz (68.1 kg)    Ideal Body Weight:     BMI:  Body mass index is 20.93 kg/m.  Estimated Nutritional Needs:   Kcal:  1850-2050  Protein:  90-100 gm  Fluid:  1.8-2 L    Molli Barrows, RD, LDN, [place] Pager [phone] After Hours Pager [phone]

## 2018-03-29 NOTE — Progress Notes (Signed)
Progress Note  Patient Name: [name] Date of Encounter: 03/29/2018  Primary Cardiologist: Mertie Moores, MD   Subjective   Pt denies CP or dyspnea  Inpatient Medications    Scheduled Meds: . amiodarone  50 mg Oral Daily  . aspirin EC  81 mg Oral Daily  . B-complex with vitamin C  1 tablet Oral Daily  . carvedilol  6.25 mg Oral BID WC  . donepezil  5 mg Oral QHS  . fenofibrate  160 mg Oral Daily  . finasteride  5 mg Oral Daily  . levothyroxine  75 mcg Oral QAC breakfast  . lisinopril  5 mg Oral Daily  . multivitamin with minerals  1 tablet Oral Daily  . spironolactone  25 mg Oral Daily  . tamsulosin  0.4 mg Oral QHS   Continuous Infusions: . piperacillin-tazobactam (ZOSYN)  IV 3.375 g (03/29/18 0551)  . vancomycin 1,000 mg (03/28/18 1256)   PRN Meds: acetaminophen, ALPRAZolam, methocarbamol, nitroGLYCERIN, ondansetron (ZOFRAN) IV, ondansetron, traMADol   Vital Signs    Vitals:   03/28/18 0800 03/28/18 1329 03/28/18 1919 03/29/18 0427  BP:  (!) 101/32 116/70 (!) 102/54  Pulse:  68 80 68  Resp:  18 19 17   Temp:  97.8 F (36.6 C) 98.9 F (37.2 C) 99.1 F (37.3 C)  TempSrc:  Oral Oral Oral  SpO2: 97% 99% 95% 96%  Weight:    150 lb 1.6 oz (68.1 kg)  Height:        Intake/Output Summary (Last 24 hours) at 03/29/2018 0926 Last data filed at 03/29/2018 0600 Gross per 24 hour  Intake 830 ml  Output 384 ml  Net 446 ml   Filed Weights   03/27/18 0525 03/28/18 0441 03/29/18 0427  Weight: 146 lb (66.2 kg) 152 lb 4.8 oz (69.1 kg) 150 lb 1.6 oz (68.1 kg)    Telemetry    Sinus with rare PVC and brief PAT - Personally Reviewed  Physical Exam   GEN: WD/WNNo acute distress.   Neck: supple Cardiac: RRR Respiratory: CTA; chest tube in place GI: Soft, NT/ND MS: No edema Neuro:  Grossly intact   Labs    Chemistry Recent Labs  Lab 03/23/18 0520 03/25/18 1611 03/27/18 0431 03/29/18 0509  NA 136  --  135 133*  K 4.0  --  4.3 4.5  CL 105  --  102  101  CO2 19*  --  25 25  GLUCOSE 119*  --  152* 105*  BUN 18  --  25* 22*  CREATININE 1.23  --  1.52* 1.24  CALCIUM 8.2*  --  8.4* 8.2*  PROT 6.1* 6.0*  --  5.7*  ALBUMIN 2.4*  --   --  2.0*  AST 23  --   --  55*  ALT 15*  --   --  59  ALKPHOS 26*  --   --  32*  BILITOT 2.2*  --   --  1.0  GFRNONAA 54*  --  42* 53*  GFRAA >60  --  48* >60  ANIONGAP 12  --  8 7     Hematology Recent Labs  Lab 03/23/18 0520 03/27/18 0431 03/29/18 0509  WBC 8.6 16.2* 13.6*  RBC 3.61* 4.24 3.97*  HGB 10.9* 12.6* 12.0*  HCT 34.1* 39.3 37.0*  MCV 94.5 92.7 93.2  MCH 30.2 29.7 30.2  MCHC 32.0 32.1 32.4  RDW 15.4 14.3 14.5  PLT 244 352 400    Cardiac Enzymes Recent Labs  Lab 03/22/18 1829 03/23/18 0520  TROPONINI <0.03 <0.03    Recent Labs  Lab 03/22/18 1056 03/22/18 1401  TROPIPOC 0.00 0.00     DDimer  Recent Labs  Lab 03/22/18 1829  DDIMER 1.72*     Radiology    Dg Chest 2 View  Result Date: 03/29/2018 CLINICAL DATA:  83 year old male status post right chest tube placed for loculated right pleural effusion. EXAM: CHEST - 2 VIEW COMPARISON:  03/28/2018 and earlier. FINDINGS: PA and lateral views. Stable right lateral pigtail right pleural catheter. Evidence of right pleural effusion drainage since 03/22/2018 but residual Patchy and confluent right lung base opacity. No pneumothorax. Stable lung markings and ventilation elsewhere. Stable cardiac size and mediastinal contours. Prior CABG. Left chest cardiac AICD. No acute osseous abnormality identified. Negative visible bowel gas pattern. IMPRESSION: 1. Stable right pigtail pleural drain. Regressed right pleural effusion since 03/22/2018, but residual Patchy and confluent right lung base opacity. No pneumothorax. 2. No new cardiopulmonary abnormality. Electronically Signed   By: Genevie Ann M.D.   On: 03/29/2018 09:19   Dg Chest 2 View  Result Date: 03/28/2018 CLINICAL DATA:  Loculated pleural effusion EXAM: CHEST - 2 VIEW COMPARISON:   03/27/2018 FINDINGS: Right basilar chest tube in unchanged position. Persistent right pleural effusion. Right lower lobe airspace disease concerning for atelectasis. No left pleural effusion. Possible right apical pneumothorax. Stable cardiomediastinal silhouette. Prior CABG. Dual lead cardiac pacemaker. No acute osseous abnormality. Severe osteoarthritis of bilateral glenohumeral joints. IMPRESSION: 1. Right basilar chest tube in unchanged position. Persistent right pleural effusion and mild basilar atelectasis. Electronically Signed   By: Kathreen Devoid   On: 03/28/2018 11:18   Dg Chest Port 1v Same Day  Result Date: 03/27/2018 CLINICAL DATA:  Pleural effusion.  Right-sided chest pain EXAM: PORTABLE CHEST 1 VIEW COMPARISON:  Yesterday FINDINGS: Drainage catheter at the right base. Interval decrease in right-sided pleural effusion. No re-expansion edema. No visible pneumothorax. The left lung is comparatively clear. Mild atelectasis or scarring at the left base. Dual-chamber pacer from the left. Status post CABG. IMPRESSION: Decreased right pleural effusion.  No new abnormality. Electronically Signed   By: Monte Fantasia M.D.   On: 03/27/2018 13:09    Cardiac Studies   Echo 12/22/17: Study Conclusions - Left ventricle: The cavity size was mildly dilated. Wall thickness was normal. Systolic function was severely reduced. The estimated ejection fraction was in the range of 25% to 30%. There is akinesis of the mid-apicalanteroseptal, anterior, anterolateral, lateral, inferolateral, and apical myocardium. Doppler parameters are consistent with abnormal left ventricular relaxation (grade 1 diastolic dysfunction). Acoustic contrast opacification revealed no evidence ofthrombus. - Mitral valve: Moderately calcified annulus. - Left atrium: The atrium was severely dilated. - Right ventricle: Pacer wire or catheter noted in right ventricle.  Patient Profile     83 y.o. male with a  history of CADs/p cath 2012, MI and CABG (7989), chronic systolic HF, ischemic cardiomyopathy, ventricular tachycardia storm(currently controlled on Amiodarone), history of ICD, atrial fibrillation (not on anticoagulation d/t hx of subdural hematoma while on coumadin), HTN, HLD, and hypothyroidism who presented to [facility] ED via EMS3/25/19with complaints of right sidedchest pain.W/U revealed right pleural effusion. S/P right chest tube placement with IR 03/25/18.  Assessment & Plan    1. Loculated right pleural effusion/empyema S/P chest tube insertion.  Cultures pending.  Continue antibiotics.  Management per pulmonary and CVTS.  2. Chronic combined systolic and diastolic heart failure Patient is euvolemic on examination today.  Continue  present dose of Spironolactone.  3. Paroxysmal Afib Patient in sinus rhythm this morning.  Continue amiodarone.  Continue carvedilol.  He is not a candidate for anticoagulation.   4. Hx of ventricular tachycardia Continue amiodarone.  ICD in place.  5. CAD s/p MI and CABG 1995 Patient admitted with atypical chest pain.  Felt to be noncardiac.  Troponins negative.  No further ischemia evaluation.  Cardiology will sign off.  Please call with questions.    For questions or updates, please contact [place] Please consult [email or web address] for contact info under Cardiology/STEMI.      Signed, Kirk Ruths, MD  03/29/2018, 9:26 AM

## 2018-03-29 NOTE — Care Management Important Message (Signed)
Important Message  Patient Details  Name: [name] MRN: [id number] Date of Birth: [birth year]   Medicare Important Message Given:  Yes    Orbie Pyo 03/29/2018, 2:55 PM

## 2018-03-30 ENCOUNTER — Inpatient Hospital Stay (HOSPITAL_COMMUNITY): Payer: Medicare Other

## 2018-03-30 ENCOUNTER — Encounter (HOSPITAL_COMMUNITY): Payer: Self-pay | Admitting: *Deleted

## 2018-03-30 DIAGNOSIS — J90 Pleural effusion, not elsewhere classified: Secondary | ICD-10-CM

## 2018-03-30 NOTE — Evaluation (Signed)
Physical Therapy Evaluation Patient Details Name: [name] MRN: [id number] DOB: [birth year] Today's Date: 03/30/2018   History of Present Illness  Pt is an 83 y.o. male with PMH significant for coronary artery disease, systolic heart failure, and gallbladder disease requiring a cholecystectomy in december 2018 who returned to the hospital with chest pain. Chest CT revealed large loculated and complicated pleural effusion on the R. Chest tube was placed 03/26/18.   Clinical Impression  Pt is close to baseline functioning (75% per pt.) and should be safe at home with available assist. There are no further acute PT needs.  Will sign off at this time.     Follow Up Recommendations No PT follow up    Equipment Recommendations  None recommended by PT    Recommendations for Other Services       Precautions / Restrictions Precautions Precautions: Fall Precaution Comments: R chest tube Restrictions Weight Bearing Restrictions: No      Mobility  Bed Mobility Overal bed mobility: Needs Assistance Bed Mobility: Supine to Sit;Sit to Supine     Supine to sit: Supervision Sit to supine: Supervision      Transfers Overall transfer level: Needs assistance   Transfers: Sit to/from Stand Sit to Stand: Supervision            Ambulation/Gait Ambulation/Gait assistance: Supervision Ambulation Distance (Feet): 300 Feet Assistive device: None Gait Pattern/deviations: Step-through pattern Gait velocity: moderate Gait velocity interpretation: at or above normal speed for age/gender General Gait Details: mild stagger likely due to R foot deformity and no shoes, but generally steady overall.  Stairs Stairs: Yes Stairs assistance: Min guard Stair Management: One rail Left;Alternating pattern;Forwards Number of Stairs: 10 General stair comments: heavier use of the rail to "haul" up the stairs.  Wheelchair Mobility    Modified Rankin (Stroke Patients Only)        Balance Overall balance assessment: Needs assistance Sitting-balance support: No upper extremity supported;Feet supported Sitting balance-Leahy Scale: Fair     Standing balance support: Bilateral upper extremity supported;No upper extremity supported;During functional activity;Single extremity supported Standing balance-Leahy Scale: Fair Standing balance comment: Statically able to stand without UE support but requires UE support for dynamic tasks.                              Pertinent Vitals/Pain Pain Assessment: No/denies pain    Home Living Family/patient expects to be discharged to:: Private residence Living Arrangements: Spouse/significant other Available Help at Discharge: Family;Available 24 hours/day Type of Home: House Home Access: Stairs to enter Entrance Stairs-Rails: None Entrance Stairs-Number of Steps: 1 Home Layout: Two level;Bed/bath upstairs Home Equipment: None      Prior Function Level of Independence: Independent         Comments: Was on the mend after cholecystectomy and had returned to walking multiple miles/day.      Hand Dominance   Dominant Hand: Right    Extremity/Trunk Assessment   Upper Extremity Assessment Upper Extremity Assessment: Generalized weakness RUE Deficits / Details: Wife shared with OT that pt with history of shoulder injury limiting his ability to utilize RUE functionally at times.     Lower Extremity Assessment Lower Extremity Assessment: Overall WFL for tasks assessed(proximal weaknesses)       Communication   Communication: No difficulties(pt quiet throughout session)  Cognition Arousal/Alertness: Awake/alert Behavior During Therapy: Flat affect Overall Cognitive Status: Within Functional Limits for tasks assessed  General Comments: Cognition is functional. Pt was quiet throughout session with his wife talking to OT the majority of the time.        General Comments General comments (skin integrity, edema, etc.): VSS throughout.  pt motivated to get home and start his walking again    Exercises     Assessment/Plan    PT Assessment Patent does not need any further PT services  PT Problem List         PT Treatment Interventions      PT Goals (Current goals can be found in the Care Plan section)  Acute Rehab PT Goals Patient Stated Goal: to go home soon PT Goal Formulation: With patient    Frequency     Barriers to discharge        Co-evaluation               AM-PAC PT "6 Clicks" Daily Activity  Outcome Measure Difficulty turning over in bed (including adjusting bedclothes, sheets and blankets)?: None Difficulty moving from lying on back to sitting on the side of the bed? : None Difficulty sitting down on and standing up from a chair with arms (e.g., wheelchair, bedside commode, etc,.)?: None Help needed moving to and from a bed to chair (including a wheelchair)?: A Little Help needed walking in hospital room?: A Little Help needed climbing 3-5 steps with a railing? : A Little 6 Click Score: 21    End of Session   Activity Tolerance: Patient tolerated treatment well Patient left: in bed;with call bell/phone within reach;with family/visitor present Nurse Communication: Mobility status PT Visit Diagnosis: Unsteadiness on feet (R26.81);Other abnormalities of gait and mobility (R26.89);Muscle weakness (generalized) (M62.81)    Time: 4315-4008 PT Time Calculation (min) (ACUTE ONLY): 21 min   Charges:   PT Evaluation $PT Eval Moderate Complexity: 1 Mod     PT G Codes:        04/10/2018  [name], PT [phone]  (pager)  [name] 2018/04/10, 6:34 PM

## 2018-03-30 NOTE — Progress Notes (Signed)
PROGRESS NOTE    [name]  ZOX:[id number] DOB: February 02, 1935 DOA: 03/22/2018 PCP: Burnard Bunting, MD      Brief Narrative:  [name] is an 83 y.o. M with CAD s/p CABG and PCI last 2012, CHF EF 25% ICD in place, AFib not on AC due to [place], CKD II baseline 1.1, hx VT on amio, HTN, and hypothyroidism who presents with chest pain, admitted to Cardiology service, found to have loculated pleural effusion by CT and CTA chest without pneumonia or PE.  Pulmonology and CVTS were consulted, recommended trial of perc drain + Pulmozyme/tPA which was done by IR on 3/28.  Had recent admission for laparoscopic cholecystectomy, hospitalization prolonged by pre-op Afib with RVR and post-op ICD shock.   Assessment & Plan:  Empyema Culture growing Enterobacter, ceftriaxone sensitive.  No fever since presentation.  Good output after TPA yesterday.   -Continue ceftriaxone, day 4 of antibiotics - Appreciate pulmonology recommendations    Chronic systolic CHF Coronary artery disease Hypertension -Continue beta-blocker, ACE inhibitor, spironolactone, aspirin, fibrate  History VT -Continue amiodarone  Atrial fibrillation, permanent CHADS2Vasc 5, not on AC due to hx IC bleed. -Continue amiodarone, beta-blocker  Hypothyroidism -Continue levothyroxine  Renal insufficiency Basleine Cr 1.1, somewhat up this hospitalization to 1.5.    Dementia -Continue donepezil  BPH  -Continue Flomax  Protein calorie malnutrition, severe -Continue boost supplement    DVT prophylaxis: SCDs Code Status: FULL Family Communication: Friend at [place] MDM and disposition Plan:    The below labs and imaging reports were reviewed, the patient status is clinically improving.  He presents with chest pain, found to have empyema, now status post chest tube.  CT repeated by Pulm today.  Tube to come out per Pulm recs.   PT recommend no PT f/u   Consultants:   Cardiology  Pulmonary  CT  surgery  IR  Procedures:   CT chest 3/26  CTA chest 3/26  IR guided perc drain 3/28  Antimicrobials:   Vancomycin 3/30 >>  Zosyn 3/30 >>    Subjective: Feels well, no complaints, no fever, cough, sputum, Confusion, leg weakness.  He has some mild chest pain, this is well controlled.  More drain output after his TPA yesterday.     Objective: Vitals:   03/30/18 0814 03/30/18 1000 03/30/18 1354 03/30/18 1959  BP: 100/60 (!) 97/57 (!) 99/55 (!) 89/51  Pulse:   73 68  Resp:    18  Temp:   98.3 F (36.8 C) 99.1 F (37.3 C)  TempSrc:   Axillary Axillary  SpO2:   98% 95%  Weight:      Height:        Intake/Output Summary (Last 24 hours) at 03/30/2018 2230 Last data filed at 03/30/2018 2138 Gross per 24 hour  Intake 410 ml  Output 545 ml  Net -135 ml   Filed Weights   03/28/18 0441 03/29/18 0427 03/30/18 0407  Weight: 69.1 kg (152 lb 4.8 oz) 68.1 kg (150 lb 1.6 oz) 67.9 kg (149 lb 9.6 oz)    Examination: General appearance: Thin elderly male, sitting up in a recliner, no acute distress. Skin: Skin warm and dry, no suspicious rashes or lesions, no subcutaneous emphysema. Cardiac: Regular rate and rhythm, no murmurs, no lower extremity edema. Respiratory: Respiratory effort normal, lungs clear, diminished in the right base. Abdomen: Abdomen soft.  No TTP. No ascites, distension, hepatosplenomegaly.   MSK: No deformities or effusions. Neuro: Cranial nerves grossly normal, moves all extremities, speech fluent.  Psych: The sensorium is intact, he is responding to questions normally, his attention is normal, affect is normal, judgment and insight appear normal for age.    Data Reviewed: I have personally reviewed following labs and imaging studies:  CBC: Recent Labs  Lab 03/27/18 0431 03/29/18 0509  WBC 16.2* 13.6*  NEUTROABS  --  9.2*  HGB 12.6* 12.0*  HCT 39.3 37.0*  MCV 92.7 93.2  PLT 352 161   Basic Metabolic Panel: Recent Labs  Lab 03/27/18 0431  03/29/18 0509  NA 135 133*  K 4.3 4.5  CL 102 101  CO2 25 25  GLUCOSE 152* 105*  BUN 25* 22*  CREATININE 1.52* 1.24  CALCIUM 8.4* 8.2*   GFR: Estimated Creatinine Clearance: 45.6 mL/min (by C-G formula based on SCr of 1.24 mg/dL). Liver Function Tests: Recent Labs  Lab 03/25/18 1611 03/29/18 0509  AST  --  55*  ALT  --  59  ALKPHOS  --  32*  BILITOT  --  1.0  PROT 6.0* 5.7*  ALBUMIN  --  2.0*   No results for input(s): LIPASE, AMYLASE in the last 168 hours. No results for input(s): AMMONIA in the last 168 hours. Coagulation Profile: No results for input(s): INR, PROTIME in the last 168 hours. Cardiac Enzymes: No results for input(s): CKTOTAL, CKMB, CKMBINDEX, TROPONINI in the last 168 hours. BNP (last 3 results) No results for input(s): PROBNP in the last 8760 hours. HbA1C: No results for input(s): HGBA1C in the last 72 hours. CBG: No results for input(s): GLUCAP in the last 168 hours. Lipid Profile: No results for input(s): CHOL, HDL, LDLCALC, TRIG, CHOLHDL, LDLDIRECT in the last 72 hours. Thyroid Function Tests: No results for input(s): TSH, T4TOTAL, FREET4, T3FREE, THYROIDAB in the last 72 hours. Anemia Panel: No results for input(s): VITAMINB12, FOLATE, FERRITIN, TIBC, IRON, RETICCTPCT in the last 72 hours. Urine analysis:    Component Value Date/Time   COLORURINE YELLOW 12/21/2017 1038   APPEARANCEUR CLEAR 12/21/2017 1038   LABSPEC 1.010 12/21/2017 1038   PHURINE 6.5 12/21/2017 1038   GLUCOSEU NEGATIVE 12/21/2017 1038   HGBUR NEGATIVE 12/21/2017 1038   Mulat 12/21/2017 1038   KETONESUR NEGATIVE 12/21/2017 1038   PROTEINUR NEGATIVE 12/21/2017 1038   NITRITE NEGATIVE 12/21/2017 1038   LEUKOCYTESUR NEGATIVE 12/21/2017 1038   Sepsis Labs: @LABRCNTIP (procalcitonin:4,lacticacidven:4)  ) Recent Results (from the past 240 hour(s))  Surgical pcr screen     Status: None   Collection Time: 03/23/18  4:41 PM  Result Value Ref Range Status    MRSA, PCR NEGATIVE NEGATIVE Final   Staphylococcus aureus NEGATIVE NEGATIVE Final    Comment: (NOTE) The Xpert SA Assay (FDA approved for NASAL specimens in patients 47 years of age and older), is one component of a comprehensive surveillance program. It is not intended to diagnose infection nor to guide or monitor treatment. Performed at [facility], [place] [address], [place], [place] [address]   Body fluid culture     Status: None   Collection Time: 03/26/18  2:36 PM  Result Value Ref Range Status   Specimen Description PLEURAL RIGHT  Final   Special Requests NONE  Final   Gram Stain   Final    FEW WBC PRESENT, PREDOMINANTLY PMN NO ORGANISMS SEEN    Culture   Final    RARE ENTEROBACTER CLOACAE CRITICAL RESULT CALLED TO, READ BACK BY AND VERIFIED WITH: C COOPER,RN AT 0950 03/27/18 BY L BENFIELD Performed at [facility], [address] [place], [place] [address]    Report Status 03/29/2018 FINAL  Final   Organism ID, Bacteria ENTEROBACTER CLOACAE  Final      Susceptibility   Enterobacter cloacae - MIC*    CEFAZOLIN >=64 RESISTANT Resistant     CEFEPIME <=1 SENSITIVE Sensitive     CEFTAZIDIME <=1 SENSITIVE Sensitive     CEFTRIAXONE <=1 SENSITIVE Sensitive     CIPROFLOXACIN 0.5 SENSITIVE Sensitive     GENTAMICIN <=1 SENSITIVE Sensitive     IMIPENEM 0.5 SENSITIVE Sensitive     TRIMETH/SULFA <=20 SENSITIVE Sensitive     PIP/TAZO 8 SENSITIVE Sensitive     * RARE ENTEROBACTER CLOACAE  Acid Fast Smear (AFB)     Status: None   Collection Time: 03/26/18  2:36 PM  Result Value Ref Range Status   AFB Specimen Processing Concentration  Final   Acid Fast Smear Negative  Final    Comment: (NOTE) Performed At: [facility] [address] [place], [place] [id number] Rush Farmer MD XA:[id number]    Source (AFB) PLEURAL  Final    Comment: RIGHT Performed at [facility], [place] [address], [place], [place] [address]   Fungus Culture With Stain     Status:  None (Preliminary result)   Collection Time: 03/26/18  2:36 PM  Result Value Ref Range Status   Fungus Stain Final report  Final    Comment: (NOTE) Performed At: [facility] [place], [place] [id number] Rush Farmer MD UT:[id number]    Fungus (Mycology) Culture PENDING  Incomplete   Fungal Source PLEURAL  Final    Comment: RIGHT Performed at [facility], [place] [address] [place], [place] [address]   Fungus Culture Result     Status: None   Collection Time: 03/26/18  2:36 PM  Result Value Ref Range Status   Result 1 Comment  Final    Comment: (NOTE) KOH/Calcofluor preparation:  no fungus observed. Performed At: [facility] [place], [place] [id number] Rush Farmer MD BS:[id number] Performed at [facility], [place] [address], [place], [place] [address]          Radiology Studies: Dg Chest 2 View  Result Date: 03/29/2018 CLINICAL DATA:  83 year old male status post right chest tube placed for loculated right pleural effusion. EXAM: CHEST - 2 VIEW COMPARISON:  03/28/2018 and earlier. FINDINGS: PA and lateral views. Stable right lateral pigtail right pleural catheter. Evidence of right pleural effusion drainage since 03/22/2018 but residual Patchy and confluent right lung base opacity. No pneumothorax. Stable lung markings and ventilation elsewhere. Stable cardiac size and mediastinal contours. Prior CABG. Left chest cardiac AICD. No acute osseous abnormality identified. Negative visible bowel gas pattern. IMPRESSION: 1. Stable right pigtail pleural drain. Regressed right pleural effusion since 03/22/2018, but residual Patchy and confluent right lung base opacity. No pneumothorax. 2. No new cardiopulmonary abnormality. Electronically Signed   By: Genevie Ann M.D.   On: 03/29/2018 09:19   Ct Chest Wo Contrast  Result Date: 03/30/2018 CLINICAL DATA:  Followup right chest drain EXAM: CT CHEST WITHOUT CONTRAST TECHNIQUE:  Multidetector CT imaging of the chest was performed following the standard protocol without IV contrast. COMPARISON:  Plain film from earlier in the same day and CT from 03/23/2018 FINDINGS: Cardiovascular: Somewhat limited due to lack of IV contrast. Aortic calcifications are noted without aneurysmal dilatation. Coronary calcifications as well as changes of prior coronary bypass grafting are noted. Defibrillator is seen similar to that  noted on prior plain film examination. Mediastinum/Nodes: Thoracic inlet is within normal limits. No significant hilar or mediastinal adenopathy is noted although the lack of IV contrast somewhat limits evaluation. Lungs/Pleura: Stable atelectatic changes are noted in the left lung base. The right pleural effusion with loculations has improved significantly in the interval from the prior exam following chest tube drainage. Some loculations remain. Persistent consolidation in the right lower lobe is noted. Inspissated parenchymal densities are noted throughout the right lower lobe likely related to aspirated material. Upper Abdomen: Visualized upper abdomen is within normal limits. Musculoskeletal: Degenerative changes of the thoracic spine are noted. No acute bony abnormality is seen. IMPRESSION: Status post right drainage catheter placement with improvement in right-sided pleural effusion. A few loculations remain. Stable bibasilar changes right greater than left. Chronic changes as described above. Electronically Signed   By: [name] M.D.   On: 03/30/2018 17:36   Dg Chest Port 1 View  Result Date: 03/30/2018 CLINICAL DATA:  Follow-up right pleural effusion with small caliber chest tube treatment. EXAM: PORTABLE CHEST 1 VIEW COMPARISON:  PA and lateral chest x-ray of March 29, 2018. FINDINGS: The left lung is well-expanded and clear. On the right there is persistent volume loss but decreased basilar atelectasis. A small right pleural effusion persists. There is no  pneumothorax. The heart and pulmonary vascular are intact. There is calcification in the wall of the aortic arch. The sternal wires are intact. The ICD is in stable position. IMPRESSION: Slight interval improvement in the appearance of the right lung. Persistent right basilar atelectasis and mild interstitial edema. Clear left lung. Trace left and small right pleural effusions. The right chest tube is in stable position. Electronically Signed   By: David  Martinique M.D.   On: 03/30/2018 08:12        Scheduled Meds: . amiodarone  50 mg Oral Daily  . aspirin EC  81 mg Oral Daily  . B-complex with vitamin C  1 tablet Oral Daily  . carvedilol  6.25 mg Oral BID WC  . donepezil  5 mg Oral QHS  . fenofibrate  160 mg Oral Daily  . finasteride  5 mg Oral Daily  . lactose free nutrition  237 mL Oral TID WC  . levothyroxine  75 mcg Oral QAC breakfast  . lisinopril  5 mg Oral Daily  . multivitamin with minerals  1 tablet Oral Daily  . spironolactone  25 mg Oral Daily  . tamsulosin  0.4 mg Oral QHS   Continuous Infusions: . cefTRIAXone (ROCEPHIN)  IV Stopped (03/30/18 1621)     LOS: 8 days    Time spent: 25 minutes    Edwin Dada, MD [facility] 03/30/2018, 8:40 AM     Pager [phone] --- please page though AMION:  [email or web address] If 7PM-7AM, please contact night-coverage

## 2018-03-30 NOTE — Progress Notes (Signed)
PULMONARY / CRITICAL CARE MEDICINE   Name: [name] MRN: [id number] DOB: 1935/08/13    ADMISSION DATE:  03/22/2018 CONSULTATION DATE: March 23, 2018  REFERRING MD: Dr. Acie Fredrickson  CHIEF COMPLAINT: Chest pain  HISTORY OF PRESENT ILLNESS:   83 year old male with a past medical history significant for coronary artery disease, systolic heart failure, and gallbladder disease requiring a cholecystectomy in December 2018 who returns to our facility with chest pain.  His wife provides most of the history at his request.  She says that he has been stable for the last several weeks.  After his hospitalization in December 2018 he felt fairly weak but had no persistent nausea or vomiting after discharge.  He was discharged on O2, 2 L to be used on exertion which he used briefly.  His appetite improved and his exercise tolerance improved and she says he was back to walking 2-3 miles a day.  He had no respiratory complaint during that time (January February and early March).  No cough, no wheeze, no chest pain, no hemoptysis.  He lost approximately 20 pounds around the time of his cholecystectomy.  However yesterday he developed the sudden onset of a constant chest pain which is dull in character and substernal.  The pain has now resolved.  This has not been associated with fevers or chills.  He was admitted to the cardiology service and the clinical impression is that the pain is not due to ischemia.  A CT scan of his chest was performed yesterday showing a large loculated and complicated pleural effusion on the right.   SUBJECTIVE:   Repeated tPA / pulmozyme instillation yesterday afternoon 4/1.  Only had ~380cc drainage thereafter. Plan is to repeat CT chest today and ensure no additional loculations / fluid that might need additional chest tube placement.   VITAL SIGNS: BP (!) 97/57   Pulse 69   Temp 99.1 F (37.3 C) (Oral)   Resp 17   Ht 5\' 11"  (1.803 m)   Wt 67.9 kg (149 lb 9.6 oz)   SpO2 96%    BMI 20.86 kg/m   HEMODYNAMICS:    VENTILATOR SETTINGS:    INTAKE / OUTPUT: I/O last 3 completed shifts: In: 61 [P.O.:1500; IV Piggyback:150] Out: 90 [Urine:500; Chest Tube:380]  PHYSICAL EXAMINATION: General:  Thin male lying in bed, in NAD, family at bedside HEENT:  [place] / AT, MMM PULM: Decreased BS in right base, Pigtail in place on right, no air leak and no current drainage CV: RRR, No M/R/G GI:  BS +, NT  MSK: intact with no edema  Neuro: A/O x 3, no focal deficits noted   LABS:  BMET Recent Labs  Lab 03/27/18 0431 03/29/18 0509  NA 135 133*  K 4.3 4.5  CL 102 101  CO2 25 25  BUN 25* 22*  CREATININE 1.52* 1.24  GLUCOSE 152* 105*    Electrolytes Recent Labs  Lab 03/27/18 0431 03/29/18 0509  CALCIUM 8.4* 8.2*    CBC Recent Labs  Lab 03/27/18 0431 03/29/18 0509  WBC 16.2* 13.6*  HGB 12.6* 12.0*  HCT 39.3 37.0*  PLT 352 400    Coag's No results for input(s): APTT, INR in the last 168 hours.  Sepsis Markers No results for input(s): LATICACIDVEN, PROCALCITON, O2SATVEN in the last 168 hours.  ABG No results for input(s): PHART, PCO2ART, PO2ART in the last 168 hours.  Liver Enzymes Recent Labs  Lab 03/29/18 0509  AST 55*  ALT 59  ALKPHOS 32*  BILITOT 1.0  ALBUMIN 2.0*    Cardiac Enzymes No results for input(s): TROPONINI, PROBNP in the last 168 hours.  Glucose No results for input(s): GLUCAP in the last 168 hours.  Imaging Dg Chest Port 1 View  Result Date: 03/30/2018 CLINICAL DATA:  Follow-up right pleural effusion with small caliber chest tube treatment. EXAM: PORTABLE CHEST 1 VIEW COMPARISON:  PA and lateral chest x-ray of March 29, 2018. FINDINGS: The left lung is well-expanded and clear. On the right there is persistent volume loss but decreased basilar atelectasis. A small right pleural effusion persists. There is no pneumothorax. The heart and pulmonary vascular are intact. There is calcification in the wall of the aortic  arch. The sternal wires are intact. The ICD is in stable position. IMPRESSION: Slight interval improvement in the appearance of the right lung. Persistent right basilar atelectasis and mild interstitial edema. Clear left lung. Trace left and small right pleural effusions. The right chest tube is in stable position. Electronically Signed   By: David  Martinique M.D.   On: 03/30/2018 08:12     STUDIES:  CT angiogram chest March 22, 2018 images independently reviewed showing a loculated and complicated appearing pleural effusion on the right.,  No PE CT chest 4/2 >   CULTURES: Pleural fluid 3/29 >> enterobacter cloacae (resistant to cefazolin). ABF 3/29 >>  Fungal 3/29 >>   ANTIBIOTICS: Vanc 3/30 > 4/1 Zosyn 3/30 > 4/1 Ceftriaxone 4/1 >   SIGNIFICANT EVENTS: 3/29 > intrapleural tPA / pulmozyme  LINES/TUBES: R pigtail chest tube 3/28 >>   DISCUSSION: 83 year old male with ischemic cardiomyopathy who has a complicated pleural effusion on the right causing chest pain.  The most likely explanation for this effusion is that it is an empyema somehow related to the cholecystectomy he had in December.  Differential diagnosis includes silent aspiration. Had chest tube placed and pleural fluid sent for cultures.  This returned positive for enterobacter cloacae which most likely stems from his recent cholecystitis. Required tPA / pulmozyme on 3/29 and had great response.  Repeated instillation 4/1 but only had 380cc drainage. Repeating CT chest 4/2 to assess if any additional loculations / collections that might need additional chest tube placement.  ASSESSMENT / PLAN:  Complicated and loculated pleural effusion / empyema - Enterobacter Cloacae (cefazolin resistance). S/p Chest tube TPA 3/29  P: Continue ceftriaxone. Repeat CT chest today. If has additional loculations, will need additional chest tube placed by IR. CXR intermittently.  Chest pain> doubt ischemia Ischemic cardiomyopathy P:   Follow.     Rest per primary.   Montey Hora, [facility] Pager: [phone]  or [phone] 03/30/2018, 10:12 AM

## 2018-03-30 NOTE — Progress Notes (Signed)
Referring Physician(s): Dr. Lamonte Sakai  Supervising Physician: Marybelle Killings  Patient Status:  [name] - In-pt  Chief Complaint: Right empyema  Subjective: Resting comfortably.  No complaints this AM.   Allergies: Patient has no known allergies.  Medications: Prior to Admission medications   Medication Sig Start Date End Date Taking? Authorizing Provider  amiodarone (PACERONE) 100 MG tablet Take 0.5 tablets (50 mg total) by mouth daily. 11/30/17  Yes Deboraha Sprang, MD  aspirin EC 81 MG tablet Take 81 mg by mouth daily.   Yes [provider]  b complex vitamins tablet Take 1 tablet by mouth daily.   Yes [provider]  carvedilol (COREG) 6.25 MG tablet TAKE 1 TABLET BY MOUTH 2 TIMES DAILY WITH A MEAL. Patient taking differently: TAKE 1 TABLET (6.25 MG) BY MOUTH 2 TIMES DAILY WITH A MEAL. 07/02/17  Yes Deboraha Sprang, MD  Coenzyme Q10 (COQ10 PO) Take 1 capsule by mouth daily.   Yes [provider]  donepezil (ARICEPT) 5 MG tablet Take 5 mg by mouth at bedtime.    Yes [provider]  fenofibrate 160 MG tablet Take 1 tablet (160 mg total) by mouth daily. 09/17/17  Yes Nahser, Wonda Cheng, MD  levothyroxine (SYNTHROID, LEVOTHROID) 75 MCG tablet Take 1 tablet (75 mcg total) by mouth daily before breakfast. 12/30/17  Yes Aline August, MD  lisinopril (PRINIVIL,ZESTRIL) 5 MG tablet Take 1 tablet (5 mg total) by mouth daily. Patient taking differently: Take 2.5 mg by mouth 2 (two) times daily.  02/10/18  Yes Nahser, Wonda Cheng, MD  LYCOPENE PO Take 1 capsule by mouth daily.   Yes [provider]  Multiple Vitamin (MULTIVITAMIN WITH MINERALS) TABS tablet Take 1 tablet by mouth daily.   Yes [provider]  nitroGLYCERIN (NITROSTAT) 0.4 MG SL tablet Place 1 tablet (0.4 mg total) under the tongue every 5 (five) minutes as needed for chest pain. For chest pain x 3 doses Patient taking differently: Place 0.4 mg under the tongue every 5 (five) minutes as  needed for chest pain (max 3 doses).  03/22/13  Yes Deboraha Sprang, MD  OVER THE COUNTER MEDICATION Take 1 tablet by mouth daily. "prostrate essentials"   Yes [provider]  spironolactone (ALDACTONE) 25 MG tablet Take 1 tablet (25 mg total) by mouth daily. 11/30/17  Yes Nahser, Wonda Cheng, MD  Tamsulosin HCl (FLOMAX) 0.4 MG CAPS Take 0.4 mg by mouth at bedtime.  03/31/11  Yes [provider]  hydrocortisone (ANUSOL-HC) 2.5 % rectal cream Apply 1 application topically 4 (four) times daily as needed for hemorrhoids. Patient not taking: Reported on 03/22/2018 12/30/17   Aline August, MD  methocarbamol (ROBAXIN) 500 MG tablet Take 2 tablets (1,000 mg total) by mouth every 6 (six) hours as needed for muscle spasms. Patient not taking: Reported on 03/22/2018 12/30/17   Aline August, MD  ondansetron (ZOFRAN) 4 MG tablet Take 1 tablet (4 mg total) by mouth every 6 (six) hours as needed for nausea. Patient not taking: Reported on 03/22/2018 12/30/17   Aline August, MD  traMADol (ULTRAM) 50 MG tablet Take 1 tablet (50 mg total) by mouth every 6 (six) hours as needed for moderate pain. Patient not taking: Reported on 03/22/2018 12/30/17   Aline August, MD     Vital Signs: BP (!) 97/57   Pulse 69   Temp 99.1 F (37.3 C) (Oral)   Resp 17   Ht 5\' 11"  (1.803 m)   Wt 149  lb 9.6 oz (67.9 kg)   SpO2 96%   BMI 20.86 kg/m   Physical Exam  NAD, alert Chest tube remains intact.  No soreness/tenderness to drain site.  Bloody output after tPA administration yesterday.   Imaging: Dg Chest 2 View  Result Date: 03/29/2018 CLINICAL DATA:  83 year old male status post right chest tube placed for loculated right pleural effusion. EXAM: CHEST - 2 VIEW COMPARISON:  03/28/2018 and earlier. FINDINGS: PA and lateral views. Stable right lateral pigtail right pleural catheter. Evidence of right pleural effusion drainage since 03/22/2018 but residual Patchy and confluent right lung base opacity. No  pneumothorax. Stable lung markings and ventilation elsewhere. Stable cardiac size and mediastinal contours. Prior CABG. Left chest cardiac AICD. No acute osseous abnormality identified. Negative visible bowel gas pattern. IMPRESSION: 1. Stable right pigtail pleural drain. Regressed right pleural effusion since 03/22/2018, but residual Patchy and confluent right lung base opacity. No pneumothorax. 2. No new cardiopulmonary abnormality. Electronically Signed   By: Genevie Ann M.D.   On: 03/29/2018 09:19   Dg Chest 2 View  Result Date: 03/28/2018 CLINICAL DATA:  Loculated pleural effusion EXAM: CHEST - 2 VIEW COMPARISON:  03/27/2018 FINDINGS: Right basilar chest tube in unchanged position. Persistent right pleural effusion. Right lower lobe airspace disease concerning for atelectasis. No left pleural effusion. Possible right apical pneumothorax. Stable cardiomediastinal silhouette. Prior CABG. Dual lead cardiac pacemaker. No acute osseous abnormality. Severe osteoarthritis of bilateral glenohumeral joints. IMPRESSION: 1. Right basilar chest tube in unchanged position. Persistent right pleural effusion and mild basilar atelectasis. Electronically Signed   By: Kathreen Devoid   On: 03/28/2018 11:18   Dg Chest Port 1 View  Result Date: 03/30/2018 CLINICAL DATA:  Follow-up right pleural effusion with small caliber chest tube treatment. EXAM: PORTABLE CHEST 1 VIEW COMPARISON:  PA and lateral chest x-ray of March 29, 2018. FINDINGS: The left lung is well-expanded and clear. On the right there is persistent volume loss but decreased basilar atelectasis. A small right pleural effusion persists. There is no pneumothorax. The heart and pulmonary vascular are intact. There is calcification in the wall of the aortic arch. The sternal wires are intact. The ICD is in stable position. IMPRESSION: Slight interval improvement in the appearance of the right lung. Persistent right basilar atelectasis and mild interstitial edema. Clear  left lung. Trace left and small right pleural effusions. The right chest tube is in stable position. Electronically Signed   By: David  Martinique M.D.   On: 03/30/2018 08:12   Dg Chest Port 1v Same Day  Result Date: 03/27/2018 CLINICAL DATA:  Pleural effusion.  Right-sided chest pain EXAM: PORTABLE CHEST 1 VIEW COMPARISON:  Yesterday FINDINGS: Drainage catheter at the right base. Interval decrease in right-sided pleural effusion. No re-expansion edema. No visible pneumothorax. The left lung is comparatively clear. Mild atelectasis or scarring at the left base. Dual-chamber pacer from the left. Status post CABG. IMPRESSION: Decreased right pleural effusion.  No new abnormality. Electronically Signed   By: Monte Fantasia M.D.   On: 03/27/2018 13:09    Labs:  CBC: Recent Labs    03/22/18 1051 03/23/18 0520 03/27/18 0431 03/29/18 0509  WBC 10.3 8.6 16.2* 13.6*  HGB 12.1* 10.9* 12.6* 12.0*  HCT 37.0* 34.1* 39.3 37.0*  PLT 244 244 352 400    COAGS: Recent Labs    03/22/18 1829  INR 1.39    BMP: Recent Labs    03/22/18 1051 03/23/18 0520 03/27/18 0431 03/29/18 0509  NA 138 136  135 133*  K 4.2 4.0 4.3 4.5  CL 107 105 102 101  CO2 22 19* 25 25  GLUCOSE 113* 119* 152* 105*  BUN 22* 18 25* 22*  CALCIUM 8.6* 8.2* 8.4* 8.2*  CREATININE 1.45* 1.23 1.52* 1.24  GFRNONAA 44* 54* 42* 53*  GFRAA 51* >60 48* >60    LIVER FUNCTION TESTS: Recent Labs    12/25/17 0538 02/08/18 0828 03/23/18 0520 03/25/18 1611 03/29/18 0509  BILITOT 2.0* 0.5 2.2*  --  1.0  AST 28 23 23   --  55*  ALT 23 18 15*  --  59  ALKPHOS 29* 28* 26*  --  32*  PROT 5.7* 6.0 6.1* 6.0* 5.7*  ALBUMIN 2.5* 3.4* 2.4*  --  2.0*    Assessment and Plan: Empyema s/p chest tube placement Chest tube remains in place.  380 mL overnight after tPA/pulmozyme administration.  Output is serosanguinous.  Continues rocephin.  IR to follow.   Electronically Signed: Docia Barrier, PA 03/30/2018, 11:21  AM   I spent a total of 15 Minutes at the the patient's bedside AND on the patient's hospital floor or unit, greater than 50% of which was counseling/coordinating care for empyema.

## 2018-03-30 NOTE — Progress Notes (Signed)
  Subjective: Feels OK. Wants to go home.  Objective: Vital signs in last 24 hours: Temp:  [98 F (36.7 C)-99.1 F (37.3 C)] 99.1 F (37.3 C) (04/02 0407) Pulse Rate:  [68-87] 69 (04/02 0407) Cardiac Rhythm: Normal sinus rhythm;Bundle branch block (04/01 2041) BP: (91-113)/(59-62) 100/60 (04/02 0814) SpO2:  [96 %-98 %] 96 % (04/02 0407) Weight:  [149 lb 9.6 oz (67.9 kg)] 149 lb 9.6 oz (67.9 kg) (04/02 0407)  Hemodynamic parameters for last 24 hours:    Intake/Output from previous day: 04/01 0701 - 04/02 0700 In: 1300 [P.O.:1200; IV Piggyback:100] Out: 680 [Urine:300; Chest Tube:380] Intake/Output this shift: Total I/O In: -  Out: 250 [Urine:250]  General appearance: alert, cooperative and no distress Neurologic: intact Heart: regular rate and rhythm Lungs: diminished breath sounds right base dark drainage from CT  Lab Results: Recent Labs    03/29/18 0509  WBC 13.6*  HGB 12.0*  HCT 37.0*  PLT 400   BMET:  Recent Labs    03/29/18 0509  NA 133*  K 4.5  CL 101  CO2 25  GLUCOSE 105*  BUN 22*  CREATININE 1.24  CALCIUM 8.2*    PT/INR: No results for input(s): LABPROT, INR in the last 72 hours. ABG    Component Value Date/Time   TCO2 24 01/19/2011 1026   CBG (last 3)  No results for input(s): GLUCAP in the last 72 hours.  Assessment/Plan: S/P  -Loculated pleural effusion  Drained an additional 380 ml after thrombolytics yesterday CXR looks about the same. Suspect most of the effusion is drained at this point Agree with Pulmonology's plan for repeat CT.  Hopefully drain can come out soon   LOS: 8 days    Melrose Nakayama 03/30/2018

## 2018-03-30 NOTE — Evaluation (Signed)
Occupational Therapy Evaluation Patient Details Name: [name] MRN: [id number] DOB: 06/18/1935 Today's Date: 03/30/2018    History of Present Illness Pt is an 83 y.o. male with PMH significant for coronary artery disease, systolic heart failure, and gallbladder disease requiring a cholecystectomy in december 2018 who returned to the hospital with chest pain. Chest CT revealed large loculated and complicated pleural effusion on the R. Chest tube was placed 03/26/18.    Clinical Impression   PTA, pt was independent with ADL and functional mobility although fatiguing easily at times. Pt currently presents with decreased activity tolerance for ADL participation as well as generalized weakness impacting his independence with ADL and functional mobility. Pt able to complete ambulating toilet transfers with min guard assist this session and benefited from UE support from RW. He was additionally able to complete LB ADL with min guard assist. Pt would benefit from continued OT services while admitted to improve independence and safety with ADL and functional mobility. Do not anticipate need for further OT follow-up post-acute D/C. OT will continue to follow while admitted.     Follow Up Recommendations  No OT follow up;Supervision/Assistance - 24 hour    Equipment Recommendations  None recommended by OT    Recommendations for Other Services       Precautions / Restrictions Precautions Precautions: Fall Precaution Comments: R chest tube Restrictions Weight Bearing Restrictions: No      Mobility Bed Mobility Overal bed mobility: Needs Assistance Bed Mobility: Supine to Sit;Sit to Supine     Supine to sit: Supervision Sit to supine: Supervision   General bed mobility comments: Initially required cues to continue with task but able to complete without physical assistance.   Transfers Overall transfer level: Needs assistance Equipment used: Rolling walker (2 wheeled) Transfers: Sit  to/from Stand Sit to Stand: Min guard         General transfer comment: Min guard assist with RW for safety.     Balance Overall balance assessment: Needs assistance Sitting-balance support: No upper extremity supported;Feet supported Sitting balance-Leahy Scale: Fair     Standing balance support: Bilateral upper extremity supported;No upper extremity supported;During functional activity;Single extremity supported Standing balance-Leahy Scale: Fair Standing balance comment: Statically able to stand without UE support but requires UE support for dynamic tasks.                            ADL either performed or assessed with clinical judgement   ADL Overall ADL's : Needs assistance/impaired Eating/Feeding: Set up;Sitting   Grooming: Set up;Sitting   Upper Body Bathing: Sitting;Supervision/ safety   Lower Body Bathing: Sit to/from stand;Min guard   Upper Body Dressing : Sitting;Supervision/safety   Lower Body Dressing: Min guard;Sit to/from stand   Toilet Transfer: Min guard;Ambulation;RW Toilet Transfer Details (indicate cue type and reason): Simulated in room Toileting- Clothing Manipulation and Hygiene: Min guard;Sit to/from stand       Functional mobility during ADLs: Min guard;Rolling walker General ADL Comments: Min guard assist with RW to maintain stability. Pt with SpO2 99% throughout session with other VSS.      Vision Baseline Vision/History: Wears glasses Wears Glasses: Reading only Patient Visual Report: No change from baseline Vision Assessment?: No apparent visual deficits     Perception     Praxis      Pertinent Vitals/Pain Pain Assessment: No/denies pain     Hand Dominance Right   Extremity/Trunk Assessment Upper Extremity Assessment Upper Extremity Assessment:  Generalized weakness(B AROM in shoulder flexion ~0-150) RUE Deficits / Details: Wife shared with OT that pt with history of shoulder injury limiting his ability to utilize  RUE functionally at times.    Lower Extremity Assessment Lower Extremity Assessment: Defer to PT evaluation       Communication Communication Communication: No difficulties(pt quiet throughout session)   Cognition Arousal/Alertness: Awake/alert Behavior During Therapy: Flat affect Overall Cognitive Status: Within Functional Limits for tasks assessed                                 General Comments: Cognition is functional. Pt was quiet throughout session with his wife talking to OT the majority of the time.    General Comments  Pt with flat affect throughout session and OT provided encouragement concerning his current situation. Pt appreciative and motivated to return to independence.     Exercises     Shoulder Instructions      Home Living Family/patient expects to be discharged to:: Private residence Living Arrangements: Spouse/significant other Available Help at Discharge: Family;Available 24 hours/day Type of Home: House Home Access: Stairs to enter [employer] of Steps: 1 Entrance Stairs-Rails: None Home Layout: Two level;Bed/bath upstairs Alternate Level Stairs-Number of Steps: flight   Bathroom Shower/Tub: Teacher, early years/pre: Standard     Home Equipment: None          Prior Functioning/Environment Level of Independence: Independent        Comments: Was on the mend after cholecystectomy and had returned to walking multiple miles/day.         OT Problem List: Decreased strength;Decreased activity tolerance;Impaired balance (sitting and/or standing);Decreased safety awareness;Decreased knowledge of use of DME or AE;Decreased knowledge of precautions;Cardiopulmonary status limiting activity      OT Treatment/Interventions: Self-care/ADL training;Therapeutic exercise;Energy conservation;DME and/or AE instruction;Therapeutic activities;Patient/family education;Balance training    OT Goals(Current goals can be found  in the care plan section) Acute Rehab OT Goals Patient Stated Goal: to go home soon OT Goal Formulation: With patient/family Time For Goal Achievement: 04/13/18 Potential to Achieve Goals: Good ADL Goals Pt Will Perform Grooming: with modified independence;standing(3 consecutive tasks) Pt Will Perform Lower Body Dressing: with modified independence;sit to/from stand Pt Will Transfer to Toilet: with modified independence;ambulating Pt Will Perform Toileting - Clothing Manipulation and hygiene: with modified independence;sit to/from stand Additional ADL Goal #1: Pt will verbalize 3 strategies to conserve energy during daily routine.  OT Frequency: Min 2X/week   Barriers to D/C:            Co-evaluation              AM-PAC PT "6 Clicks" Daily Activity     Outcome Measure Help from another person eating meals?: A Little Help from another person taking care of personal grooming?: A Little Help from another person toileting, which includes using toliet, bedpan, or urinal?: A Little Help from another person bathing (including washing, rinsing, drying)?: A Little Help from another person to put on and taking off regular upper body clothing?: A Little Help from another person to put on and taking off regular lower body clothing?: A Little 6 Click Score: 18   End of Session Equipment Utilized During Treatment: Rolling walker  Activity Tolerance: Patient tolerated treatment well Patient left: in bed;with call bell/phone within reach;with family/visitor present  OT Visit Diagnosis: Other abnormalities of gait and mobility (R26.89);Muscle weakness (generalized) (M62.81)  Time: 8403-9795 OT Time Calculation (min): 16 min Charges:  OT General Charges $OT Visit: 1 Visit OT Evaluation $OT Eval Low Complexity: 1 Low G-Codes:     Norman Herrlich, MS OTR/L  Pager: [place] 03/30/2018, 3:55 PM

## 2018-03-30 NOTE — Care Management Note (Addendum)
Case Management Note  Patient Details  Name: [name] MRN: [id number] Date of Birth: September 12, 1935  Subjective/Objective:  Pt presented for Chest Pain and Loculated pleural effusions with empyema. S/p Chest Tube TPA 03-26-18 then placed back to suction.                   Action/Plan: CM will continue to monitor for disposition needs.   Expected Discharge Date:                  Expected Discharge Plan:  [place]  In-House Referral:  NA  Discharge planning Services  CM Consult  Post Acute Care Choice:   N/A Choice offered to:    N/A  DME Arranged:   N/A DME Agency:   N/A  HH Arranged:   N/A HH Agency:   N/A  Status of Service:  COMPLETED  If discussed at Long Length of Stay Meetings, dates discussed:    Additional Comments: 1627 03-31-18 Jacqlyn Krauss, RN,BSN [phone] No home needs identified at this time. Pt states will not need HH Services.  Bethena Roys, RN 03/30/2018, 4:56 PM

## 2018-03-31 ENCOUNTER — Other Ambulatory Visit: Payer: Self-pay | Admitting: Radiology

## 2018-03-31 DIAGNOSIS — I10 Essential (primary) hypertension: Secondary | ICD-10-CM

## 2018-03-31 DIAGNOSIS — J869 Pyothorax without fistula: Secondary | ICD-10-CM

## 2018-03-31 DIAGNOSIS — E43 Unspecified severe protein-calorie malnutrition: Secondary | ICD-10-CM

## 2018-03-31 DIAGNOSIS — I5022 Chronic systolic (congestive) heart failure: Secondary | ICD-10-CM

## 2018-03-31 DIAGNOSIS — Z9689 Presence of other specified functional implants: Secondary | ICD-10-CM

## 2018-03-31 DIAGNOSIS — E039 Hypothyroidism, unspecified: Secondary | ICD-10-CM

## 2018-03-31 DIAGNOSIS — I480 Paroxysmal atrial fibrillation: Secondary | ICD-10-CM

## 2018-03-31 MED ORDER — BOOST PLUS PO LIQD: 237.0000 mL | Freq: Three times a day (TID) | ORAL | 0 refills | Status: DC

## 2018-03-31 MED ORDER — CEFDINIR 300 MG PO CAPS
300.0000 mg | ORAL_CAPSULE | Freq: Two times a day (BID) | ORAL | Status: DC
  Administered 2018-03-31: 300 mg via ORAL
  Filled 2018-03-31 (×2): qty 1

## 2018-03-31 MED ORDER — SULFAMETHOXAZOLE-TRIMETHOPRIM 800-160 MG PO TABS
1.0000 | ORAL_TABLET | Freq: Two times a day (BID) | ORAL | Status: DC
  Filled 2018-03-31: qty 1

## 2018-03-31 MED ORDER — CEFDINIR 300 MG PO CAPS: 300.0000 mg | ORAL_CAPSULE | Freq: Two times a day (BID) | ORAL | Status: DC

## 2018-03-31 MED ORDER — LIDOCAINE HCL 1 % IJ SOLN
INTRAMUSCULAR | Status: AC
  Filled 2018-03-31: qty 20

## 2018-03-31 MED ORDER — CEFDINIR 300 MG PO CAPS: 300.0000 mg | ORAL_CAPSULE | Freq: Two times a day (BID) | ORAL | 0 refills | Status: DC

## 2018-03-31 NOTE — Progress Notes (Signed)
Reviewed CT chest with Dr. Vernard Gambles. Drain in good position. Few small residual loculations remain, not amenable to additional drain placement. IR continuing to follow.  Ascencion Dike PA-C Interventional Radiology 03/31/2018 11:45 AM

## 2018-03-31 NOTE — Progress Notes (Signed)
Further review of CT and discussion with PCCM.  (R)chest drain pulled back approximately 4-5 cm, resecured with 0-Prolene suture and Stat-Lock. Flushed gently with 45mL sterile saline. Return of light blood tinged serous fluid. Attached to gravity bag. Instructions given to pt and wife about care and management. We will contact pt to arrange f/u CT and eval next week.   Ascencion Dike PA-C Interventional Radiology 03/31/2018 3:14 PM

## 2018-03-31 NOTE — Progress Notes (Signed)
PULMONARY / CRITICAL CARE MEDICINE   Name: [name] MRN: [id number] DOB: 07/29/1935    ADMISSION DATE:  03/22/2018 CONSULTATION DATE: March 23, 2018  REFERRING MD: Dr. Acie Fredrickson  CHIEF COMPLAINT: Chest pain  HISTORY OF PRESENT ILLNESS:   83 year old male with a past medical history significant for coronary artery disease, systolic heart failure, and gallbladder disease requiring a cholecystectomy in December 2018 who returns to our facility with chest pain.  His wife provides most of the history at his request.  She says that he has been stable for the last several weeks.  After his hospitalization in December 2018 he felt fairly weak but had no persistent nausea or vomiting after discharge.  He was discharged on O2, 2 L to be used on exertion which he used briefly.  His appetite improved and his exercise tolerance improved and she says he was back to walking 2-3 miles a day.  He had no respiratory complaint during that time (January February and early March).  No cough, no wheeze, no chest pain, no hemoptysis.  He lost approximately 20 pounds around the time of his cholecystectomy.  However yesterday he developed the sudden onset of a constant chest pain which is dull in character and substernal.  The pain has now resolved.  This has not been associated with fevers or chills.  He was admitted to the cardiology service and the clinical impression is that the pain is not due to ischemia.  A CT scan of his chest was performed yesterday showing a large loculated and complicated pleural effusion on the right.   SUBJECTIVE:   Repeated tPA / pulmozyme instillation yesterday afternoon 4/1.  Only had ~380cc drainage thereafter. Repeated CT of the chest 03/30/2018 which reveals a decrease in effusion with some loculated strands removing post TPA   VITAL SIGNS: BP (!) 93/56   Pulse 66   Temp 98.9 F (37.2 C) (Axillary)   Resp 16   Ht 5\' 11"  (1.803 m)   Wt 67.8 kg (149 lb 7.6 oz)   SpO2 97%   BMI  20.85 kg/m   HEMODYNAMICS:    VENTILATOR SETTINGS:    INTAKE / OUTPUT: I/O last 3 completed shifts: In: 650 [P.O.:650] Out: 765 [Urine:625; Chest Tube:140]  PHYSICAL EXAMINATION: General: Elderly male in no acute distress HEENT: No JVD lymphadenopathy appreciated PSY: Normal effect Neuro: Intact CV: Sounds are  PULM: Decreased breath sounds at the base, right chest tube without any current drainage that is documented JO:INOM, non-tender, bsx4 active  Extremities: warm/dry, negative edema  Skin: no rashes or lesions    LABS:  BMET Recent Labs  Lab 03/27/18 0431 03/29/18 0509  NA 135 133*  K 4.3 4.5  CL 102 101  CO2 25 25  BUN 25* 22*  CREATININE 1.52* 1.24  GLUCOSE 152* 105*    Electrolytes Recent Labs  Lab 03/27/18 0431 03/29/18 0509  CALCIUM 8.4* 8.2*    CBC Recent Labs  Lab 03/27/18 0431 03/29/18 0509  WBC 16.2* 13.6*  HGB 12.6* 12.0*  HCT 39.3 37.0*  PLT 352 400    Coag's No results for input(s): APTT, INR in the last 168 hours.  Sepsis Markers No results for input(s): LATICACIDVEN, PROCALCITON, O2SATVEN in the last 168 hours.  ABG No results for input(s): PHART, PCO2ART, PO2ART in the last 168 hours.  Liver Enzymes Recent Labs  Lab 03/29/18 0509  AST 55*  ALT 59  ALKPHOS 32*  BILITOT 1.0  ALBUMIN 2.0*    Cardiac  Enzymes No results for input(s): TROPONINI, PROBNP in the last 168 hours.  Glucose No results for input(s): GLUCAP in the last 168 hours.  Imaging Ct Chest Wo Contrast  Result Date: 03/30/2018 CLINICAL DATA:  Followup right chest drain EXAM: CT CHEST WITHOUT CONTRAST TECHNIQUE: Multidetector CT imaging of the chest was performed following the standard protocol without IV contrast. COMPARISON:  Plain film from earlier in the same day and CT from 03/23/2018 FINDINGS: Cardiovascular: Somewhat limited due to lack of IV contrast. Aortic calcifications are noted without aneurysmal dilatation. Coronary calcifications as  well as changes of prior coronary bypass grafting are noted. Defibrillator is seen similar to that noted on prior plain film examination. Mediastinum/Nodes: Thoracic inlet is within normal limits. No significant hilar or mediastinal adenopathy is noted although the lack of IV contrast somewhat limits evaluation. Lungs/Pleura: Stable atelectatic changes are noted in the left lung base. The right pleural effusion with loculations has improved significantly in the interval from the prior exam following chest tube drainage. Some loculations remain. Persistent consolidation in the right lower lobe is noted. Inspissated parenchymal densities are noted throughout the right lower lobe likely related to aspirated material. Upper Abdomen: Visualized upper abdomen is within normal limits. Musculoskeletal: Degenerative changes of the thoracic spine are noted. No acute bony abnormality is seen. IMPRESSION: Status post right drainage catheter placement with improvement in right-sided pleural effusion. A few loculations remain. Stable bibasilar changes right greater than left. Chronic changes as described above. Electronically Signed   By: [name] M.D.   On: 03/30/2018 17:36     STUDIES:  CT angiogram chest March 22, 2018 images independently reviewed showing a loculated and complicated appearing pleural effusion on the right.,  No PE CT chest 4/2 > improvement in pleural effusion with minimal remaining loculated straight  CULTURES: Pleural fluid 3/29 >> enterobacter cloacae (resistant to cefazolin). ABF 3/29 >>  Fungal 3/29 >>   ANTIBIOTICS: Vanc 3/30 > 4/1 Zosyn 3/30 > 4/1 Ceftriaxone 4/1 >   SIGNIFICANT EVENTS: 3/29 > intrapleural tPA / pulmozyme 03/30/2018 repeated intrapleural TPA  LINES/TUBES: R pigtail chest tube 3/28 >>   DISCUSSION: 83 year old male with ischemic cardiomyopathy who has a complicated pleural effusion on the right causing chest pain.  The most likely explanation for this  effusion is that it is an empyema somehow related to the cholecystectomy he had in December.  Differential diagnosis includes silent aspiration. Had chest tube placed and pleural fluid sent for cultures.  This returned positive for enterobacter cloacae which most likely stems from his recent cholecystitis. Required tPA / pulmozyme on 3/29 and had great response.  Repeated instillation 4/1 but only had 380cc drainage. Repeating CT chest 4/2 to assess if any additional loculations / collections that might need additional chest tube placement. 03/31/2018 evaluated by interventional radiology no need for additional chest tube drainage per their note.  ASSESSMENT / PLAN:  Complicated and loculated pleural effusion / empyema - Enterobacter Cloacae (cefazolin resistance). S/p Chest tube TPA 3/29  P: Continue antimicrobial therapy Status post TPA placed on 03/30/2018 with 300 cc of additional drainage Follow-up CT scan shows improvement in pleural effusion with a few loculated strands remaining. He may have reached maximal benefit from pigtail catheter at this time.  Chest pain> doubt ischemia Ischemic cardiomyopathy P:  Follow.     Rest per primary.   Richardson Landry Minor ACNP [facility] P[facility] Pager [phone] till 1 pm If no answer page [phone] 03/31/2018, 11:47 AM

## 2018-03-31 NOTE — Discharge Summary (Signed)
Physician Discharge Summary  [name] BTD:[id number] DOB: [birth year] DOA: 03/22/2018  PCP: Burnard Bunting, MD  Admit date: 03/22/2018 Discharge date: 03/31/2018  Time spent: 45 minutes  Recommendations for Outpatient Follow-up:  Patient will be discharged to home.  Patient will need to follow up with primary care provider within one week of discharge.  Follow up with Dr. Acie Fredrickson, cardiology. Follow with interventional radiology. Patient should continue medications as prescribed.  Patient should follow a heart healthy diet.   Discharge Diagnoses:  Empyema  Chronic systolic CHF/coronary disease/hypertension History of VT Atrial fibrillation, permanent Hypothyroidism Acute kidney injury Dementia BPH Severe protein calorie malnutrition  Discharge Condition: Stable  Diet recommendation: heart healthy  Filed Weights   03/29/18 0427 03/30/18 0407 03/31/18 0500  Weight: 68.1 kg (150 lb 1.6 oz) 67.9 kg (149 lb 9.6 oz) 67.8 kg (149 lb 7.6 oz)    History of present illness:  On 03/22/2018 by Dr. Mertie Moores Mr. [name] presented to [facility] ED via EMS after experiencing an episode of chest pain early this morning at 8 AM.  He reports that the chest pain occured while he was sitting down and denies any stressful recent events in his life. He describes the chest pain/tightness as central, non-radiating, non-pleuritic, and less than 5/10 intensity. Unsure if anything made it worse, but felt better after taking ASA, and reports that chest pain lasted approximately five minutes and had dissipated by the time EMS arrived. Denies any associated N/V, SOB, numbness/tingling, jaw pain, lightheadedness, dizziness, palpitations, or syncope. At baseline, patient is very active and walks two miles a day without symptoms of chest pain, exertional dyspnea, or claudication. Denies any orthopnea, PND or LEE. Wife at bedside states that he is very compliant with his medications, and was able to take his morning  doses before the chest pain started. Patient states that he has not experienced any chest pain since his heart attack in 1995, and at that time, the pain was much more intense and painful. Patient is unable to verbally differentiate the two episodes of chest pain well, but reports "this is definitely not the same pain as before, believe me, I would remember."   Of note, patient was recently discharged from hospital on 12/30/17 after undergoing a laparoscopic cholecystectomy. At that time, hospitalization was prolonged due to preop atrial fibrillation with RVR and postop by ICD shock.   Last outpatient office visit with Dr. Acie Fredrickson took place on 01/20/18. At this time, patient had no complaints of angina but did endorse a dry cough with 17 pound weight loss since August 2018. Plan was to continue with current medication regimen and to decrease patient's Amiodarone dose after being stable for an additional six months.   Today, patient was given 324 mg ASA by EMS and was without chest pain on arrival. In ED, patient is afebrile, slightly hypotensive and tachypneic. Troponin negative x 2. EKG shows NSR, HR 70, with RBBB and Q waves demonstrating old antero-lateral infarct. No acute findings per medtronic ICD interrogation. CXR shows right pleural effusion, likely superimposed right basilar atelectasis or pneumonia. BUN/Cr slightly elevated at 22/1.45 (baseline creatinine around 1.07 in 11/2017).    Currently, patient is chest pain free without any complaints.   Interim history Admitted to Cardiology service, found to have loculated pleural effusion by CT and CTA chest without pneumonia or PE.  Pulmonology and CVTS were consulted, recommended trial of perc drain + Pulmozyme/tPA which was done by IR on 3/28.  Had recent admission for  laparoscopic cholecystectomy, hospitalization prolonged by pre-op Afib with RVR and post-op ICD shock.  Hospital Course:  Empyema  -Culture growing Enterobacter,  ceftriaxone sensitive -no fever since presentation  -pulmonology, cardiothoracic surgery, interventional radiology consulted and appreciated -s/p perc drain placement -discussed with IR, plan to pull current drain back and connect to gravity- patient can be discharged with this and can follow up in the clinic next week -Pulmonology recommended cefdinir 300mg  BID for 5 days after chest tube in pulled -discussed antibiotics with pharmacy, given that organism was sensitive to ceftriaxone, cefdinir should also cover it -will discharge patient with 10 days of antibiotics  Chronic systolic CHF/coronary disease/hypertension -Appears euvolemic  -continue spironolactone, coreg, lisinopril   History of VT -Continue amiodarone  Atrial fibrillation, permanent -CHADSVASC 5 -Not on anticoagulation due to history of intracranial bleed -Continue amiodarone,coreg  Hypothyroidism -Continue Synthroid  Acute kidney injury -Baseline creatinine 1.1, however rose to 1.52 (03/27/2018), however improved to 1.24 -repeat BMP in one week  Dementia -Appears stable, continue donepezil  BPH -Continue home medication  Severe protein calorie malnutrition -continue supplements   Procedures: CT chest 3/26 CTA chest 3/26 IR guided perc drain 3/28  Consultations: Interventional radiology Cardiology Pulmonology CT surgery  Discharge Exam: Vitals:   03/31/18 1006 03/31/18 1510  BP: (!) 93/56 (!) 95/58  Pulse:  69  Resp:    Temp:  98.2 F (36.8 C)  SpO2: 97% 97%   Patient denies current chest pain, shortness of breath, abdominal pain, N/V/D/C.     General: Well developed, well nourished, NAD, appears stated age  HEENT: NCAT, mucous membranes moist.  Neck: Supple  Cardiovascular: S1 S2 auscultated, no rubs, murmurs or gallops. Regular rate and rhythm.  Respiratory: Decreased breath sounds right base, right chest tube without current drainage  Abdomen: Soft, nontender, nondistended, +  bowel sounds  Extremities: warm dry without cyanosis clubbing or edema  Neuro: AAOx3, nonfocal  Psych: Normal affect and demeanor with intact judgement and insight  Discharge Instructions Discharge Instructions    Discharge instructions   Complete by:  As directed    Patient will be discharged to home.  Patient will need to follow up with primary care provider within one week of discharge.  Follow up with Dr. Acie Fredrickson, cardiology. Follow with interventional radiology. Patient should continue medications as prescribed.  Patient should follow a heart healthy diet.     Allergies as of 03/31/2018   No Known Allergies     Medication List    STOP taking these medications   hydrocortisone 2.5 % rectal cream Commonly known as:  ANUSOL-HC     TAKE these medications   amiodarone 100 MG tablet Commonly known as:  PACERONE Take 0.5 tablets (50 mg total) by mouth daily.   aspirin EC 81 MG tablet Take 81 mg by mouth daily.   b complex vitamins tablet Take 1 tablet by mouth daily.   carvedilol 6.25 MG tablet Commonly known as:  COREG TAKE 1 TABLET BY MOUTH 2 TIMES DAILY WITH A MEAL. What changed:  See the new instructions.   cefdinir 300 MG capsule Commonly known as:  OMNICEF Take 1 capsule (300 mg total) by mouth 2 (two) times daily.   COQ10 PO Take 1 capsule by mouth daily.   donepezil 5 MG tablet Commonly known as:  ARICEPT Take 5 mg by mouth at bedtime.   fenofibrate 160 MG tablet Take 1 tablet (160 mg total) by mouth daily.   lactose free nutrition Liqd Take 237 mLs by  mouth 3 (three) times daily with meals.   levothyroxine 75 MCG tablet Commonly known as:  SYNTHROID, LEVOTHROID Take 1 tablet (75 mcg total) by mouth daily before breakfast.   lisinopril 5 MG tablet Commonly known as:  PRINIVIL,ZESTRIL Take 1 tablet (5 mg total) by mouth daily. What changed:    how much to take  when to take this   LYCOPENE PO Take 1 capsule by mouth daily.   methocarbamol  500 MG tablet Commonly known as:  ROBAXIN Take 2 tablets (1,000 mg total) by mouth every 6 (six) hours as needed for muscle spasms.   multivitamin with minerals Tabs tablet Take 1 tablet by mouth daily.   nitroGLYCERIN 0.4 MG SL tablet Commonly known as:  NITROSTAT Place 1 tablet (0.4 mg total) under the tongue every 5 (five) minutes as needed for chest pain. For chest pain x 3 doses What changed:    reasons to take this  additional instructions   ondansetron 4 MG tablet Commonly known as:  ZOFRAN Take 1 tablet (4 mg total) by mouth every 6 (six) hours as needed for nausea.   OVER THE COUNTER MEDICATION Take 1 tablet by mouth daily. "prostrate essentials"   spironolactone 25 MG tablet Commonly known as:  ALDACTONE Take 1 tablet (25 mg total) by mouth daily.   tamsulosin 0.4 MG Caps capsule Commonly known as:  FLOMAX Take 0.4 mg by mouth at bedtime.   traMADol 50 MG tablet Commonly known as:  ULTRAM Take 1 tablet (50 mg total) by mouth every 6 (six) hours as needed for moderate pain.      No Known Allergies Follow-up Information    Burnard Bunting, MD. Schedule an appointment as soon as possible for a visit in 1 week(s).   Specialty:  Internal Medicine Why:  Hospital follow up Contact information: [place] [address] [phone]        Nahser, Wonda Cheng, MD .   Specialty:  Cardiology Contact information: [place] [address] [phone]            The results of significant diagnostics from this hospitalization (including imaging, microbiology, ancillary and laboratory) are listed below for reference.    Significant Diagnostic Studies: Dg Chest 2 View  Result Date: 03/29/2018 CLINICAL DATA:  83 year old male status post right chest tube placed for loculated right pleural effusion. EXAM: CHEST - 2 VIEW COMPARISON:  03/28/2018 and earlier. FINDINGS: PA and lateral views. Stable right lateral pigtail  right pleural catheter. Evidence of right pleural effusion drainage since 03/22/2018 but residual Patchy and confluent right lung base opacity. No pneumothorax. Stable lung markings and ventilation elsewhere. Stable cardiac size and mediastinal contours. Prior CABG. Left chest cardiac AICD. No acute osseous abnormality identified. Negative visible bowel gas pattern. IMPRESSION: 1. Stable right pigtail pleural drain. Regressed right pleural effusion since 03/22/2018, but residual Patchy and confluent right lung base opacity. No pneumothorax. 2. No new cardiopulmonary abnormality. Electronically Signed   By: Genevie Ann M.D.   On: 03/29/2018 09:19   Dg Chest 2 View  Result Date: 03/28/2018 CLINICAL DATA:  Loculated pleural effusion EXAM: CHEST - 2 VIEW COMPARISON:  03/27/2018 FINDINGS: Right basilar chest tube in unchanged position. Persistent right pleural effusion. Right lower lobe airspace disease concerning for atelectasis. No left pleural effusion. Possible right apical pneumothorax. Stable cardiomediastinal silhouette. Prior CABG. Dual lead cardiac pacemaker. No acute osseous abnormality. Severe osteoarthritis of bilateral glenohumeral joints. IMPRESSION: 1. Right basilar chest tube  in unchanged position. Persistent right pleural effusion and mild basilar atelectasis. Electronically Signed   By: Kathreen Devoid   On: 03/28/2018 11:18   Dg Chest 2 View  Result Date: 03/22/2018 CLINICAL DATA:  Chest pain radiating across the chest began today. History of previous MI, CHF, CABG. EXAM: CHEST - 2 VIEW COMPARISON:  PA and lateral chest x-ray of January 20, 2018 FINDINGS: There is a moderate-sized right pleural effusion. There is right basilar parenchymal density as well. The left lung is clear. The heart is top-normal in size. The pulmonary vascularity is not engorged. The ICD is in stable position. The sternal wires are intact. There is calcification in the wall of the aortic arch. There is multilevel degenerative  disc disease of the thoracic spine. IMPRESSION: New right pleural effusion. There is likely superimposed right basilar atelectasis or pneumonia. Top-normal cardiac size without overt pulmonary edema. Previous CABG. Thoracic aortic atherosclerosis. Electronically Signed   By: David  Martinique M.D.   On: 03/22/2018 12:26   Ct Chest Wo Contrast  Result Date: 03/30/2018 CLINICAL DATA:  Followup right chest drain EXAM: CT CHEST WITHOUT CONTRAST TECHNIQUE: Multidetector CT imaging of the chest was performed following the standard protocol without IV contrast. COMPARISON:  Plain film from earlier in the same day and CT from 03/23/2018 FINDINGS: Cardiovascular: Somewhat limited due to lack of IV contrast. Aortic calcifications are noted without aneurysmal dilatation. Coronary calcifications as well as changes of prior coronary bypass grafting are noted. Defibrillator is seen similar to that noted on prior plain film examination. Mediastinum/Nodes: Thoracic inlet is within normal limits. No significant hilar or mediastinal adenopathy is noted although the lack of IV contrast somewhat limits evaluation. Lungs/Pleura: Stable atelectatic changes are noted in the left lung base. The right pleural effusion with loculations has improved significantly in the interval from the prior exam following chest tube drainage. Some loculations remain. Persistent consolidation in the right lower lobe is noted. Inspissated parenchymal densities are noted throughout the right lower lobe likely related to aspirated material. Upper Abdomen: Visualized upper abdomen is within normal limits. Musculoskeletal: Degenerative changes of the thoracic spine are noted. No acute bony abnormality is seen. IMPRESSION: Status post right drainage catheter placement with improvement in right-sided pleural effusion. A few loculations remain. Stable bibasilar changes right greater than left. Chronic changes as described above. Electronically Signed   By: [name] M.D.   On: 03/30/2018 17:36   Ct Chest Wo Contrast  Result Date: 03/22/2018 CLINICAL DATA:  Chest pain set a EXAM: CT CHEST WITHOUT CONTRAST TECHNIQUE: Multidetector CT imaging of the chest was performed following the standard protocol without IV contrast. COMPARISON:  Chest radiograph 03/22/2018 FINDINGS: Cardiovascular: Coronary, aortic arch, and branch vessel atherosclerotic vascular disease. Mild cardiomegaly. Calcification and pseudoaneurysm of the left ventricle indicating prior myocardial infarction. The right ventricular ICD lead is up near the pulmonary valve rather than at the right ventricular apex. However, this seems to be a chronic configuration in this patient. Prior CABG. Severe bilateral degenerative glenohumeral arthropathy. Mediastinum/Nodes: Unremarkable Lungs/Pleura: Moderate-sized loculated right pleural effusion. Atelectasis of most of the right lower lobe with loculated fluid collections along the fissures. There is some atelectasis in the right middle lobe as well. Trace left pleural effusion with some fluid tracking along the left major fissure. Upper Abdomen: Borderline elevated right hemidiaphragm. Cholecystectomy. Dense atherosclerotic calcification in the celiac artery. Stable rim calcified structure in the right upper abdomen adjacent to the IVC and liver on image 131/3. Musculoskeletal: Thoracic  spondylosis. IMPRESSION: 1. Moderate-sized loculated right pleural effusion. Small left pleural effusion. Passive atelectasis in the right lung. 2. Calcification along a pseudoaneurysm of the left ventricle indicating prior myocardial infarction. 3. Chronic borderline malpositioning of the right ventricular ICD lead. Instead of being along the right ventricular apex it is up near the pulmonary valve. 4. Mild cardiomegaly. 5. Borderline elevated right hemidiaphragm. 6. Thoracic spondylosis. 7.  Aortic Atherosclerosis (ICD10-I70.0).  Prior CABG. Electronically Signed   By: Van Clines M.D.   On: 03/22/2018 19:07   Ct Angio Chest Pe W Or Wo Contrast  Result Date: 03/23/2018 CLINICAL DATA:  Chest pain.  Elevated D-dimer. EXAM: CT ANGIOGRAPHY CHEST WITH CONTRAST TECHNIQUE: Multidetector CT imaging of the chest was performed using the standard protocol during bolus administration of intravenous contrast. Multiplanar CT image reconstructions and MIPs were obtained to evaluate the vascular anatomy. CONTRAST:  49mL ISOVUE-370 IOPAMIDOL (ISOVUE-370) INJECTION 76% COMPARISON:  03/22/2018 FINDINGS: Cardiovascular: Pulmonary arterial opacification is good. There are no pulmonary emboli. Heart size is upper limits of normal. Calcified left ventricular aneurysm. There has been previous median sternotomy and CABG. The aorta shows atherosclerosis but no aneurysm or dissection. Mediastinum/Nodes: No mediastinal mass or lymphadenopathy. Lungs/Pleura: Worsening loculated pleural fluid collections on the right worrisome for empyema. Tiny dot of air or gas within a medial loculation. No pleural fluid on the left. There is compressive atelectasis and or pneumonia the right lung, particularly in the lower lung. There is mild dependent atelectasis or patchy pneumonia at the left base. Upper Abdomen: No acute finding. Chronic benign intraperitoneal calcifications not of acute significance. Musculoskeletal: Bridging osteophytes of the spine with ankylosis. No acute bone finding. Review of the MIP images confirms the above findings. IMPRESSION: Worsening of loculated pleural fluid collections in the right hemithorax worrisome for empyema. Associated compressive atelectasis and or pneumonia of the right lung. Mild patchy atelectasis and or pneumonia at the left base. No sign of pulmonary emboli. Aortic Atherosclerosis (ICD10-I70.0). Electronically Signed   By: Nelson Chimes M.D.   On: 03/23/2018 11:23   Ir Guided Niel Hummer W Catheter Placement  Result Date: 03/25/2018 INDICATION: Right pleural effusion EXAM:  RIGHT THORACOSTOMY MEDICATIONS: The patient is currently admitted to the hospital and receiving intravenous antibiotics. The antibiotics were administered within an appropriate time frame prior to the initiation of the procedure. ANESTHESIA/SEDATION: Fentanyl 25 mcg IV; Versed 0.5 mg IV Moderate Sedation Time:  10 minutes The patient was continuously monitored during the procedure by the interventional radiology nurse under my direct supervision. COMPLICATIONS: None immediate. PROCEDURE: Informed written consent was obtained from the patient after a thorough discussion of the procedural risks, benefits and alternatives. All questions were addressed. Maximal Sterile Barrier Technique was utilized including caps, mask, sterile gowns, sterile gloves, sterile drape, hand hygiene and skin antiseptic. A timeout was performed prior to the initiation of the procedure. Under sonographic guidance, a micropuncture needle was inserted into the right pleural effusion the mid axillary line approach. It was removed over a 018 wire which was up sized to an Amplatz. A 12 French pigtail drain was advanced over the wire and coiled in the fluid collection. It was looped and string fixed then sewn to the skin. Cloudy yellow fluid was aspirated. It was attached to a pleura vac at 20 cm water suction. FINDINGS: Images confirm 82 French right thoracostomy placement in the right pleural effusion. IMPRESSION: Successful 12 French right thoracostomy tube placement. Electronically Signed   By: Marybelle Killings M.D.   On:  03/25/2018 16:02   Dg Chest Port 1 View  Result Date: 03/30/2018 CLINICAL DATA:  Follow-up right pleural effusion with small caliber chest tube treatment. EXAM: PORTABLE CHEST 1 VIEW COMPARISON:  PA and lateral chest x-ray of March 29, 2018. FINDINGS: The left lung is well-expanded and clear. On the right there is persistent volume loss but decreased basilar atelectasis. A small right pleural effusion persists. There is no  pneumothorax. The heart and pulmonary vascular are intact. There is calcification in the wall of the aortic arch. The sternal wires are intact. The ICD is in stable position. IMPRESSION: Slight interval improvement in the appearance of the right lung. Persistent right basilar atelectasis and mild interstitial edema. Clear left lung. Trace left and small right pleural effusions. The right chest tube is in stable position. Electronically Signed   By: David  Martinique M.D.   On: 03/30/2018 08:12   Dg Chest Port 1 View  Result Date: 03/26/2018 CLINICAL DATA:  Follow-up chest tube drainage of pleural effusion. EXAM: PORTABLE CHEST 1 VIEW COMPARISON:  03/22/2018 FINDINGS: Previous median sternotomy and CABG. Pacemaker/AICD. Left chest remains clear. Pigtail drainage catheter in the pleural space at the right base. Diminished but persistent right pleural density with volume loss and/or infiltrate in the right lower lung. No pneumothorax. IMPRESSION: Right pleural drainage catheter in place. Persistent right pleural density with dependent pulmonary atelectasis/infiltrate. No pneumothorax. Electronically Signed   By: Nelson Chimes M.D.   On: 03/26/2018 07:54   Dg Chest Port 1v Same Day  Result Date: 03/27/2018 CLINICAL DATA:  Pleural effusion.  Right-sided chest pain EXAM: PORTABLE CHEST 1 VIEW COMPARISON:  Yesterday FINDINGS: Drainage catheter at the right base. Interval decrease in right-sided pleural effusion. No re-expansion edema. No visible pneumothorax. The left lung is comparatively clear. Mild atelectasis or scarring at the left base. Dual-chamber pacer from the left. Status post CABG. IMPRESSION: Decreased right pleural effusion.  No new abnormality. Electronically Signed   By: Monte Fantasia M.D.   On: 03/27/2018 13:09    Microbiology: Recent Results (from the past 240 hour(s))  Surgical pcr screen     Status: None   Collection Time: 03/23/18  4:41 PM  Result Value Ref Range Status   MRSA, PCR  NEGATIVE NEGATIVE Final   Staphylococcus aureus NEGATIVE NEGATIVE Final    Comment: (NOTE) The Xpert SA Assay (FDA approved for NASAL specimens in patients 28 years of age and older), is one component of a comprehensive surveillance program. It is not intended to diagnose infection nor to guide or monitor treatment. Performed at [facility], [place] [address] [place], [place] [address]   Body fluid culture     Status: None   Collection Time: 03/26/18  2:36 PM  Result Value Ref Range Status   Specimen Description PLEURAL RIGHT  Final   Special Requests NONE  Final   Gram Stain   Final    FEW WBC PRESENT, PREDOMINANTLY PMN NO ORGANISMS SEEN    Culture   Final    RARE ENTEROBACTER CLOACAE CRITICAL RESULT CALLED TO, READ BACK BY AND VERIFIED WITH: C COOPER,RN AT 0950 03/27/18 BY L BENFIELD Performed at [facility], [place] [address], [place], [place] [address]    Report Status 03/29/2018 FINAL  Final   Organism ID, Bacteria ENTEROBACTER CLOACAE  Final      Susceptibility   Enterobacter cloacae - MIC*    CEFAZOLIN >=64 RESISTANT Resistant     CEFEPIME <=1 SENSITIVE Sensitive  CEFTAZIDIME <=1 SENSITIVE Sensitive     CEFTRIAXONE <=1 SENSITIVE Sensitive     CIPROFLOXACIN 0.5 SENSITIVE Sensitive     GENTAMICIN <=1 SENSITIVE Sensitive     IMIPENEM 0.5 SENSITIVE Sensitive     TRIMETH/SULFA <=20 SENSITIVE Sensitive     PIP/TAZO 8 SENSITIVE Sensitive     * RARE ENTEROBACTER CLOACAE  Acid Fast Smear (AFB)     Status: None   Collection Time: 03/26/18  2:36 PM  Result Value Ref Range Status   AFB Specimen Processing Concentration  Final   Acid Fast Smear Negative  Final    Comment: (NOTE) Performed At: [facility] [place], [place] [id number] Rush Farmer MD [id number]    Source (AFB) PLEURAL  Final    Comment: RIGHT Performed at [facility], [place] [address], [place], [place] [address]   Fungus Culture With Stain     Status: None  (Preliminary result)   Collection Time: 03/26/18  2:36 PM  Result Value Ref Range Status   Fungus Stain Final report  Final    Comment: (NOTE) Performed At: [facility] [place], [place] [id number] Rush Farmer MD [id number]    Fungus (Mycology) Culture PENDING  Incomplete   Fungal Source PLEURAL  Final    Comment: RIGHT Performed at [facility], [place] [address] [place], [place] [address]   Fungus Culture Result     Status: None   Collection Time: 03/26/18  2:36 PM  Result Value Ref Range Status   Result 1 Comment  Final    Comment: (NOTE) KOH/Calcofluor preparation:  no fungus observed. Performed At: [facility] [place], [place] [id number] Rush Farmer MD [id number] Performed at [facility], [place] [address], [place], [place] [address]      Labs: Basic Metabolic Panel: Recent Labs  Lab 03/27/18 0431 03/29/18 0509  NA 135 133*  K 4.3 4.5  CL 102 101  CO2 25 25  GLUCOSE 152* 105*  BUN 25* 22*  CREATININE 1.52* 1.24  CALCIUM 8.4* 8.2*   Liver Function Tests: Recent Labs  Lab 03/25/18 1611 03/29/18 0509  AST  --  55*  ALT  --  59  ALKPHOS  --  32*  BILITOT  --  1.0  PROT 6.0* 5.7*  ALBUMIN  --  2.0*   No results for input(s): LIPASE, AMYLASE in the last 168 hours. No results for input(s): AMMONIA in the last 168 hours. CBC: Recent Labs  Lab 03/27/18 0431 03/29/18 0509  WBC 16.2* 13.6*  NEUTROABS  --  9.2*  HGB 12.6* 12.0*  HCT 39.3 37.0*  MCV 92.7 93.2  PLT 352 400   Cardiac Enzymes: No results for input(s): CKTOTAL, CKMB, CKMBINDEX, TROPONINI in the last 168 hours. BNP: BNP (last 3 results) No results for input(s): BNP in the last 8760 hours.  ProBNP (last 3 results) No results for input(s): PROBNP in the last 8760 hours.  CBG: No results for input(s): GLUCAP in the last 168 hours.     Signed:  Cristal Ford  [facility] 03/31/2018, 4:31 PM

## 2018-04-01 ENCOUNTER — Other Ambulatory Visit: Payer: Self-pay | Admitting: Thoracic Surgery (Cardiothoracic Vascular Surgery)

## 2018-04-01 ENCOUNTER — Other Ambulatory Visit: Payer: Self-pay | Admitting: Cardiovascular Disease

## 2018-04-01 DIAGNOSIS — J869 Pyothorax without fistula: Secondary | ICD-10-CM

## 2018-04-07 ENCOUNTER — Ambulatory Visit
Admission: RE | Admit: 2018-04-07 | Discharge: 2018-04-07 | Disposition: A | Discharge status: Home / Self Care | Payer: Medicare Other | Source: Ambulatory Visit | Attending: Radiology | Admitting: Radiology

## 2018-04-07 ENCOUNTER — Ambulatory Visit
Admission: RE | Admit: 2018-04-07 | Discharge: 2018-04-07 | Disposition: A | Discharge status: Home / Self Care | Payer: Medicare Other | Source: Ambulatory Visit | Attending: Thoracic Surgery (Cardiothoracic Vascular Surgery) | Admitting: Thoracic Surgery (Cardiothoracic Vascular Surgery)

## 2018-04-07 DIAGNOSIS — J869 Pyothorax without fistula: Secondary | ICD-10-CM | POA: Diagnosis not present

## 2018-04-07 DIAGNOSIS — J439 Emphysema, unspecified: Secondary | ICD-10-CM | POA: Diagnosis not present

## 2018-04-07 DIAGNOSIS — R413 Other amnesia: Secondary | ICD-10-CM | POA: Diagnosis not present

## 2018-04-07 DIAGNOSIS — R5381 Other malaise: Secondary | ICD-10-CM | POA: Diagnosis not present

## 2018-04-07 DIAGNOSIS — I4891 Unspecified atrial fibrillation: Secondary | ICD-10-CM | POA: Diagnosis not present

## 2018-04-07 DIAGNOSIS — Z6822 Body mass index (BMI) 22.0-22.9, adult: Secondary | ICD-10-CM | POA: Diagnosis not present

## 2018-04-07 DIAGNOSIS — E039 Hypothyroidism, unspecified: Secondary | ICD-10-CM | POA: Diagnosis not present

## 2018-04-07 DIAGNOSIS — I5022 Chronic systolic (congestive) heart failure: Secondary | ICD-10-CM | POA: Diagnosis not present

## 2018-04-07 HISTORY — PX: IR RADIOLOGIST EVAL & MGMT: IMG5224

## 2018-04-07 NOTE — Progress Notes (Signed)
Patient ID: [name], male   DOB: 05-Jan-1935, 83 y.o.   MRN: [id number]       Chief Complaint:   Right chest empyema drain, outpatient follow-up  Referring Physician(s): Bruning,Kevin  History of Present Illness: [name] is a 83 y.o. male with multiple comorbidities including atrial fibrillation, CHF, hyperlipidemia, hypertension, ischemic cardiomyopathy.  He returns for outpatient follow-up of the right chest empyema drain.  He completes his antibiotics tomorrow.  No significant drain output.  Drain is connected to a gravity bag.  He has not been flushing the catheter.  Repeat CT today demonstrates trace residual right pleural fluid and small subpulmonic residual loculations with fluid and air but no significant residual fluid at the drain catheter site.  Past Medical History:  Diagnosis Date  . Atrial arrhythmia/a fibrillation    not on coumadin 2/2 subdural hematoma  . Automatic implantable cardiac defibrillator in situ    medtronic  . Automatic implantable cardioverter-defibrillator in situ   . CHF (congestive heart failure) ([place])   . Chronic systolic congestive heart failure ([place])   . Dysrhythmia   . Encounter for long-term (current) use of other medications    amiodarone  . H/O hiatal hernia   . Hyperlipidemia   . Hypertension   . Hypothyroidism   . Ischemic cardiomyopathy    status post CABG with patent vein grafts and an atretic LIMA to his ramus, January 2012  . Myocardial infarction [facility])    1995, non-STEMI January 2012  . Pacemaker   . Subdural hematoma ([facility])    on coumadin  . Thyroid disease    hypothyroid  . Ventricular fibrillation ([place])   . Ventricular tachycardia ([place])     [place]-January 2012- responded to Amiodarone    Past Surgical History:  Procedure Laterality Date  . bilateral cranitomies for sub hematomas    . BYPASS GRAFT  1995   5  . CHOLECYSTECTOMY N/A 12/26/2017   Procedure: LAPAROSCOPIC CHOLECYSTECTOMY;  Surgeon: Ileana Roup, MD;  Location: [place];  Service: General;  Laterality: N/A;  . CORONARY ARTERY BYPASS GRAFT  1995   5  . IMPLANTABLE CARDIOVERTER DEFIBRILLATOR GENERATOR CHANGE N/A 03/28/2013   Procedure: IMPLANTABLE CARDIOVERTER DEFIBRILLATOR GENERATOR CHANGE;  Surgeon: Deboraha Sprang, MD;  Location[facility] CATH LAB;  Service: Cardiovascular;  Laterality: N/A;  . INGUINAL HERNIA REPAIR Left 05/15/2014   Procedure: HERNIA REPAIR INGUINAL ADULT;  Surgeon: Zenovia Jarred, MD;  Location: [place];  Service: General;  Laterality: Left;  . INSERT / REPLACE / REMOVE PACEMAKER  14    generator repl   . INSERTION OF MESH Left 05/15/2014   Procedure: INSERTION OF MESH;  Surgeon: Zenovia Jarred, MD;  Location: [place];  Service: General;  Laterality: Left;  . IR GUIDED [place]  03/25/2018  . IR RADIOLOGIST EVAL & MGMT  04/07/2018  . TONSILLECTOMY      Allergies: Patient has no known allergies.  Medications: Prior to Admission medications   Medication Sig Start Date End Date Taking? Authorizing Provider  amiodarone (PACERONE) 100 MG tablet Take 0.5 tablets (50 mg total) by mouth daily. 11/30/17   Deboraha Sprang, MD  aspirin EC 81 MG tablet Take 81 mg by mouth daily.    [provider]  b complex vitamins tablet Take 1 tablet by mouth daily.    [provider]  carvedilol (COREG) 6.25 MG tablet TAKE 1 TABLET BY MOUTH 2 TIMES DAILY WITH A MEAL. Patient  taking differently: TAKE 1 TABLET (6.25 MG) BY MOUTH 2 TIMES DAILY WITH A MEAL. 07/02/17   Deboraha Sprang, MD  cefdinir (OMNICEF) 300 MG capsule Take 1 capsule (300 mg total) by mouth 2 (two) times daily. 03/31/18   Mikhail, Velta Addison, DO  Coenzyme Q10 (COQ10 PO) Take 1 capsule by mouth daily.    [provider]  donepezil (ARICEPT) 5 MG tablet Take 5 mg by mouth at bedtime.     [provider]  fenofibrate 160 MG tablet Take 1 tablet (160 mg total) by mouth daily. 09/17/17   Nahser, Wonda Cheng, MD  lactose free  nutrition (BOOST PLUS) LIQD Take 237 mLs by mouth 3 (three) times daily with meals. 03/31/18   Cristal Ford, DO  levothyroxine (SYNTHROID, LEVOTHROID) 75 MCG tablet Take 1 tablet (75 mcg total) by mouth daily before breakfast. 12/30/17   Aline August, MD  lisinopril (PRINIVIL,ZESTRIL) 5 MG tablet Take 1 tablet (5 mg total) by mouth daily. Patient taking differently: Take 2.5 mg by mouth 2 (two) times daily.  02/10/18   Nahser, Wonda Cheng, MD  LYCOPENE PO Take 1 capsule by mouth daily.    [provider]  methocarbamol (ROBAXIN) 500 MG tablet Take 2 tablets (1,000 mg total) by mouth every 6 (six) hours as needed for muscle spasms. Patient not taking: Reported on 03/22/2018 12/30/17   Aline August, MD  Multiple Vitamin (MULTIVITAMIN WITH MINERALS) TABS tablet Take 1 tablet by mouth daily.    [provider]  nitroGLYCERIN (NITROSTAT) 0.4 MG SL tablet Place 1 tablet (0.4 mg total) under the tongue every 5 (five) minutes as needed for chest pain. For chest pain x 3 doses Patient taking differently: Place 0.4 mg under the tongue every 5 (five) minutes as needed for chest pain (max 3 doses).  03/22/13   Deboraha Sprang, MD  ondansetron (ZOFRAN) 4 MG tablet Take 1 tablet (4 mg total) by mouth every 6 (six) hours as needed for nausea. Patient not taking: Reported on 03/22/2018 12/30/17   Aline August, MD  OVER THE COUNTER MEDICATION Take 1 tablet by mouth daily. "prostrate essentials"    [provider]  spironolactone (ALDACTONE) 25 MG tablet Take 1 tablet (25 mg total) by mouth daily. 11/30/17   Nahser, Wonda Cheng, MD  Tamsulosin HCl (FLOMAX) 0.4 MG CAPS Take 0.4 mg by mouth at bedtime.  03/31/11   [provider]  traMADol (ULTRAM) 50 MG tablet Take 1 tablet (50 mg total) by mouth every 6 (six) hours as needed for moderate pain. Patient not taking: Reported on 03/22/2018 12/30/17   Aline August, MD     Family History  Problem Relation Age of Onset  . Cerebral aneurysm Father    . Hypertension Father     Social History   Socioeconomic History  . Marital status: Married    Spouse name: Not on file  . Number of children: Not on file  . Years of education: Not on file  . Highest education level: Not on file  Occupational History  . Not on file  Social Needs  . Financial resource strain: Not on file  . Food insecurity:    Worry: Not on file    Inability: Not on file  . Transportation needs:    Medical: Not on file    Non-medical: Not on file  Tobacco Use  . Smoking status: Never Smoker  . Smokeless tobacco: Former Network engineer and Sexual Activity  . Alcohol use: No  .  Drug use: No  . Sexual activity: Not on file  Lifestyle  . Physical activity:    Days per week: Not on file    Minutes per session: Not on file  . Stress: Not on file  Relationships  . Social connections:    Talks on phone: Not on file    Gets together: Not on file    Attends religious service: Not on file    Active member of club or organization: Not on file    Attends meetings of clubs or organizations: Not on file    Relationship status: Not on file  Other Topics Concern  . Not on file  Social History Narrative  . Not on file     Review of Systems: A 12 point ROS discussed and pertinent positives are indicated in the HPI above.  All other systems are negative.  Review of Systems  Vital Signs: BP (!) 85/49 (BP Location: Left Arm, Patient Position: Sitting, Cuff Size: Normal)   Pulse 62   Temp (!) 96.9 F (36.1 C)   Resp 16   SpO2 99%   Physical Exam  Constitutional:  Frail elderly male in no distress.  Eyes: Conjunctivae are normal. No scleral icterus.  Pulmonary/Chest: Effort normal and breath sounds normal.  Pigtail small right chest tube site clean, dry and intact.    Imaging: Dg Chest 2 View  Result Date: 03/29/2018 CLINICAL DATA:  83 year old male status post right chest tube placed for loculated right pleural effusion. EXAM: CHEST - 2 VIEW  COMPARISON:  03/28/2018 and earlier. FINDINGS: PA and lateral views. Stable right lateral pigtail right pleural catheter. Evidence of right pleural effusion drainage since 03/22/2018 but residual Patchy and confluent right lung base opacity. No pneumothorax. Stable lung markings and ventilation elsewhere. Stable cardiac size and mediastinal contours. Prior CABG. Left chest cardiac AICD. No acute osseous abnormality identified. Negative visible bowel gas pattern. IMPRESSION: 1. Stable right pigtail pleural drain. Regressed right pleural effusion since 03/22/2018, but residual Patchy and confluent right lung base opacity. No pneumothorax. 2. No new cardiopulmonary abnormality. Electronically Signed   By: Genevie Ann M.D.   On: 03/29/2018 09:19   Dg Chest 2 View  Result Date: 03/28/2018 CLINICAL DATA:  Loculated pleural effusion EXAM: CHEST - 2 VIEW COMPARISON:  03/27/2018 FINDINGS: Right basilar chest tube in unchanged position. Persistent right pleural effusion. Right lower lobe airspace disease concerning for atelectasis. No left pleural effusion. Possible right apical pneumothorax. Stable cardiomediastinal silhouette. Prior CABG. Dual lead cardiac pacemaker. No acute osseous abnormality. Severe osteoarthritis of bilateral glenohumeral joints. IMPRESSION: 1. Right basilar chest tube in unchanged position. Persistent right pleural effusion and mild basilar atelectasis. Electronically Signed   By: Kathreen Devoid   On: 03/28/2018 11:18   Dg Chest 2 View  Result Date: 03/22/2018 CLINICAL DATA:  Chest pain radiating across the chest began today. History of previous MI, CHF, CABG. EXAM: CHEST - 2 VIEW COMPARISON:  PA and lateral chest x-ray of January 20, 2018 FINDINGS: There is a moderate-sized right pleural effusion. There is right basilar parenchymal density as well. The left lung is clear. The heart is top-normal in size. The pulmonary vascularity is not engorged. The ICD is in stable position. The sternal wires are  intact. There is calcification in the wall of the aortic arch. There is multilevel degenerative disc disease of the thoracic spine. IMPRESSION: New right pleural effusion. There is likely superimposed right basilar atelectasis or pneumonia. Top-normal cardiac size without overt pulmonary edema.  Previous CABG. Thoracic aortic atherosclerosis. Electronically Signed   By: David  Martinique M.D.   On: 03/22/2018 12:26   Ct Chest Wo Contrast  Result Date: 04/07/2018 CLINICAL DATA:  Right chest empyema drain, outpatient follow-up EXAM: CT CHEST WITHOUT CONTRAST TECHNIQUE: Multidetector CT imaging of the chest was performed following the standard protocol without IV contrast. COMPARISON:  03/30/2018 FINDINGS: Cardiovascular: Limited without contrast. Extensive postop changes of previous pacemaker and coronary bypass noted. Extensive native coronary calcification. Chronic calcifications of the left ventricular wall. No pericardial effusion. Mediastinum/Nodes: No enlarged mediastinal or axillary lymph nodes. Thyroid gland, trachea, and esophagus demonstrate no significant findings. Lungs/Pleura: Mild apical pleuroparenchymal scarring. Continued improvement in the bibasilar atelectasis and right lower lobe streaky bronchovascular airspace opacity. Trace residual right pleural effusion. Small residual subdiaphragmatic right pleural air fluid loculations remain along the drain catheter course and pleura however are smaller compared to 03/30/2018. Upper Abdomen: No acute abnormality. Musculoskeletal: Bones are osteopenic. Degenerative changes of the spine. Diffuse DISH present. No acute osseous finding. IMPRESSION: Continued improvement in the loculated right pleural effusion. Trace amount of right pleural loculated fluid and air remain along the drain catheter site. Continued improvement in the right lower lobe atelectasis and airspace process. No new finding. Aortic Atherosclerosis (ICD10-I70.0). Electronically Signed   By: Jerilynn Mages.   Hera Celaya M.D.   On: 04/07/2018 13:52   Ct Chest Wo Contrast  Result Date: 03/30/2018 CLINICAL DATA:  Followup right chest drain EXAM: CT CHEST WITHOUT CONTRAST TECHNIQUE: Multidetector CT imaging of the chest was performed following the standard protocol without IV contrast. COMPARISON:  Plain film from earlier in the same day and CT from 03/23/2018 FINDINGS: Cardiovascular: Somewhat limited due to lack of IV contrast. Aortic calcifications are noted without aneurysmal dilatation. Coronary calcifications as well as changes of prior coronary bypass grafting are noted. Defibrillator is seen similar to that noted on prior plain film examination. Mediastinum/Nodes: Thoracic inlet is within normal limits. No significant hilar or mediastinal adenopathy is noted although the lack of IV contrast somewhat limits evaluation. Lungs/Pleura: Stable atelectatic changes are noted in the left lung base. The right pleural effusion with loculations has improved significantly in the interval from the prior exam following chest tube drainage. Some loculations remain. Persistent consolidation in the right lower lobe is noted. Inspissated parenchymal densities are noted throughout the right lower lobe likely related to aspirated material. Upper Abdomen: Visualized upper abdomen is within normal limits. Musculoskeletal: Degenerative changes of the thoracic spine are noted. No acute bony abnormality is seen. IMPRESSION: Status post right drainage catheter placement with improvement in right-sided pleural effusion. A few loculations remain. Stable bibasilar changes right greater than left. Chronic changes as described above. Electronically Signed   By: [name] M.D.   On: 03/30/2018 17:36   Ct Chest Wo Contrast  Result Date: 03/22/2018 CLINICAL DATA:  Chest pain set a EXAM: CT CHEST WITHOUT CONTRAST TECHNIQUE: Multidetector CT imaging of the chest was performed following the standard protocol without IV contrast. COMPARISON:  Chest  radiograph 03/22/2018 FINDINGS: Cardiovascular: Coronary, aortic arch, and branch vessel atherosclerotic vascular disease. Mild cardiomegaly. Calcification and pseudoaneurysm of the left ventricle indicating prior myocardial infarction. The right ventricular ICD lead is up near the pulmonary valve rather than at the right ventricular apex. However, this seems to be a chronic configuration in this patient. Prior CABG. Severe bilateral degenerative glenohumeral arthropathy. Mediastinum/Nodes: Unremarkable Lungs/Pleura: Moderate-sized loculated right pleural effusion. Atelectasis of most of the right lower lobe with loculated fluid collections along the  fissures. There is some atelectasis in the right middle lobe as well. Trace left pleural effusion with some fluid tracking along the left major fissure. Upper Abdomen: Borderline elevated right hemidiaphragm. Cholecystectomy. Dense atherosclerotic calcification in the celiac artery. Stable rim calcified structure in the right upper abdomen adjacent to the IVC and liver on image 131/3. Musculoskeletal: Thoracic spondylosis. IMPRESSION: 1. Moderate-sized loculated right pleural effusion. Small left pleural effusion. Passive atelectasis in the right lung. 2. Calcification along a pseudoaneurysm of the left ventricle indicating prior myocardial infarction. 3. Chronic borderline malpositioning of the right ventricular ICD lead. Instead of being along the right ventricular apex it is up near the pulmonary valve. 4. Mild cardiomegaly. 5. Borderline elevated right hemidiaphragm. 6. Thoracic spondylosis. 7.  Aortic Atherosclerosis (ICD10-I70.0).  Prior CABG. Electronically Signed   By: Van Clines M.D.   On: 03/22/2018 19:07   Ct Angio Chest Pe W Or Wo Contrast  Result Date: 03/23/2018 CLINICAL DATA:  Chest pain.  Elevated D-dimer. EXAM: CT ANGIOGRAPHY CHEST WITH CONTRAST TECHNIQUE: Multidetector CT imaging of the chest was performed using the standard protocol during  bolus administration of intravenous contrast. Multiplanar CT image reconstructions and MIPs were obtained to evaluate the vascular anatomy. CONTRAST:  74mL ISOVUE-370 IOPAMIDOL (ISOVUE-370) INJECTION 76% COMPARISON:  03/22/2018 FINDINGS: Cardiovascular: Pulmonary arterial opacification is good. There are no pulmonary emboli. Heart size is upper limits of normal. Calcified left ventricular aneurysm. There has been previous median sternotomy and CABG. The aorta shows atherosclerosis but no aneurysm or dissection. Mediastinum/Nodes: No mediastinal mass or lymphadenopathy. Lungs/Pleura: Worsening loculated pleural fluid collections on the right worrisome for empyema. Tiny dot of air or gas within a medial loculation. No pleural fluid on the left. There is compressive atelectasis and or pneumonia the right lung, particularly in the lower lung. There is mild dependent atelectasis or patchy pneumonia at the left base. Upper Abdomen: No acute finding. Chronic benign intraperitoneal calcifications not of acute significance. Musculoskeletal: Bridging osteophytes of the spine with ankylosis. No acute bone finding. Review of the MIP images confirms the above findings. IMPRESSION: Worsening of loculated pleural fluid collections in the right hemithorax worrisome for empyema. Associated compressive atelectasis and or pneumonia of the right lung. Mild patchy atelectasis and or pneumonia at the left base. No sign of pulmonary emboli. Aortic Atherosclerosis (ICD10-I70.0). Electronically Signed   By: Nelson Chimes M.D.   On: 03/23/2018 11:23   Ir Guided Niel Hummer W Catheter Placement  Result Date: 03/25/2018 INDICATION: Right pleural effusion EXAM: RIGHT THORACOSTOMY MEDICATIONS: The patient is currently admitted to the hospital and receiving intravenous antibiotics. The antibiotics were administered within an appropriate time frame prior to the initiation of the procedure. ANESTHESIA/SEDATION: Fentanyl 25 mcg IV; Versed 0.5 mg IV  Moderate Sedation Time:  10 minutes The patient was continuously monitored during the procedure by the interventional radiology nurse under my direct supervision. COMPLICATIONS: None immediate. PROCEDURE: Informed written consent was obtained from the patient after a thorough discussion of the procedural risks, benefits and alternatives. All questions were addressed. Maximal Sterile Barrier Technique was utilized including caps, mask, sterile gowns, sterile gloves, sterile drape, hand hygiene and skin antiseptic. A timeout was performed prior to the initiation of the procedure. Under sonographic guidance, a micropuncture needle was inserted into the right pleural effusion the mid axillary line approach. It was removed over a 018 wire which was up sized to an Amplatz. A 12 French pigtail drain was advanced over the wire and coiled in the fluid collection. It was  looped and string fixed then sewn to the skin. Cloudy yellow fluid was aspirated. It was attached to a pleura vac at 20 cm water suction. FINDINGS: Images confirm 41 French right thoracostomy placement in the right pleural effusion. IMPRESSION: Successful 12 French right thoracostomy tube placement. Electronically Signed   By: Marybelle Killings M.D.   On: 03/25/2018 16:02   Dg Chest Port 1 View  Result Date: 03/30/2018 CLINICAL DATA:  Follow-up right pleural effusion with small caliber chest tube treatment. EXAM: PORTABLE CHEST 1 VIEW COMPARISON:  PA and lateral chest x-ray of March 29, 2018. FINDINGS: The left lung is well-expanded and clear. On the right there is persistent volume loss but decreased basilar atelectasis. A small right pleural effusion persists. There is no pneumothorax. The heart and pulmonary vascular are intact. There is calcification in the wall of the aortic arch. The sternal wires are intact. The ICD is in stable position. IMPRESSION: Slight interval improvement in the appearance of the right lung. Persistent right basilar atelectasis and  mild interstitial edema. Clear left lung. Trace left and small right pleural effusions. The right chest tube is in stable position. Electronically Signed   By: David  Martinique M.D.   On: 03/30/2018 08:12   Dg Chest Port 1 View  Result Date: 03/26/2018 CLINICAL DATA:  Follow-up chest tube drainage of pleural effusion. EXAM: PORTABLE CHEST 1 VIEW COMPARISON:  03/22/2018 FINDINGS: Previous median sternotomy and CABG. Pacemaker/AICD. Left chest remains clear. Pigtail drainage catheter in the pleural space at the right base. Diminished but persistent right pleural density with volume loss and/or infiltrate in the right lower lung. No pneumothorax. IMPRESSION: Right pleural drainage catheter in place. Persistent right pleural density with dependent pulmonary atelectasis/infiltrate. No pneumothorax. Electronically Signed   By: Nelson Chimes M.D.   On: 03/26/2018 07:54   Dg Chest Port 1v Same Day  Result Date: 03/27/2018 CLINICAL DATA:  Pleural effusion.  Right-sided chest pain EXAM: PORTABLE CHEST 1 VIEW COMPARISON:  Yesterday FINDINGS: Drainage catheter at the right base. Interval decrease in right-sided pleural effusion. No re-expansion edema. No visible pneumothorax. The left lung is comparatively clear. Mild atelectasis or scarring at the left base. Dual-chamber pacer from the left. Status post CABG. IMPRESSION: Decreased right pleural effusion.  No new abnormality. Electronically Signed   By: Monte Fantasia M.D.   On: 03/27/2018 13:09   Ir Radiologist Eval & Mgmt  Result Date: 04/07/2018 Please refer to notes tab for details about interventional procedure. (Op Note)   Labs:  CBC: Recent Labs    03/22/18 1051 03/23/18 0520 03/27/18 0431 03/29/18 0509  WBC 10.3 8.6 16.2* 13.6*  HGB 12.1* 10.9* 12.6* 12.0*  HCT 37.0* 34.1* 39.3 37.0*  PLT 244 244 352 400    COAGS: Recent Labs    03/22/18 1829  INR 1.39    BMP: Recent Labs    03/22/18 1051 03/23/18 0520 03/27/18 0431 03/29/18 0509    NA 138 136 135 133*  K 4.2 4.0 4.3 4.5  CL 107 105 102 101  CO2 22 19* 25 25  GLUCOSE 113* 119* 152* 105*  BUN 22* 18 25* 22*  CALCIUM 8.6* 8.2* 8.4* 8.2*  CREATININE 1.45* 1.23 1.52* 1.24  GFRNONAA 44* 54* 42* 53*  GFRAA 51* >60 48* >60    LIVER FUNCTION TESTS: Recent Labs    12/25/17 0538 02/08/18 0828 03/23/18 0520 03/25/18 1611 03/29/18 0509  BILITOT 2.0* 0.5 2.2*  --  1.0  AST 28 23 23   --  55*  ALT 23 18 15*  --  59  ALKPHOS 29* 28* 26*  --  32*  PROT 5.7* 6.0 6.1* 6.0* 5.7*  ALBUMIN 2.5* 3.4* 2.4*  --  2.0*    TUMOR MARKERS: No results for input(s): AFPTM, CEA, CA199, CHROMGRNA in the last 8760 hours.  Assessment and Plan:  Near complete resolution of the right effusion and small residual subpulmonic pleural air-fluid loculations away from the drain catheter site.  No significant output over the last several days.  He remains asymptomatic.  No fevers or chest pain.  No respiratory distress.  He completes antibiotics tomorrow.  Drain catheter has been to gravity drainage with no flushing.  Plan: Drain will be removed today.  He has outpatient follow-up With Dr. Katharina Caper in the next few weeks.   Electronically Signed: Greggory Keen 04/07/2018, 3:54 PM   I spent a total of  30 Minutes   in face to face in clinical consultation, greater than 50% of which was counseling/coordinating care for this patient with a right chest drain for empyema

## 2018-04-11 ENCOUNTER — Other Ambulatory Visit: Payer: Self-pay | Admitting: Internal Medicine

## 2018-04-12 ENCOUNTER — Ambulatory Visit (INDEPENDENT_AMBULATORY_CARE_PROVIDER_SITE_OTHER): Payer: Medicare Other | Admitting: Internal Medicine

## 2018-04-12 ENCOUNTER — Encounter: Payer: Self-pay | Admitting: Internal Medicine

## 2018-04-12 VITALS — BP 118/68 | HR 74 | Ht 71.0 in | Wt 148.0 lb

## 2018-04-12 DIAGNOSIS — Z95810 Presence of automatic (implantable) cardiac defibrillator: Secondary | ICD-10-CM | POA: Diagnosis not present

## 2018-04-12 DIAGNOSIS — I5022 Chronic systolic (congestive) heart failure: Secondary | ICD-10-CM | POA: Diagnosis not present

## 2018-04-12 DIAGNOSIS — I480 Paroxysmal atrial fibrillation: Secondary | ICD-10-CM

## 2018-04-12 DIAGNOSIS — I2510 Atherosclerotic heart disease of native coronary artery without angina pectoris: Secondary | ICD-10-CM

## 2018-04-12 NOTE — Progress Notes (Signed)
Patient Care Team: Burnard Bunting, MD as PCP - General (Internal Medicine) Nahser, Wonda Cheng, MD as PCP - Cardiology (Cardiology)   HPI  [name] is a 83 y.o. male Seen in followup for VT and cardiac arrestin the setting of ischemic cardiomyopathy with prior myocardial infarction, prior bypass surgery in 1995.  History of ventricular tachycardia storm >>> amiodarone high doses-- poorly tolerated because of nausea and jitteriness low-dose amiodarone   He has  recent catheterization (January 2012) whiich demonstrated patent vein grafts with an atretic LIMA to his ramus. Ejection fraction was 15-20%.   Hospitalized 1/19 for lap cholecystectomy complicated by atrial fibrillation and rapid rate ICD shock.  3/19 hospitalized for an empyema which grew out Enterobacter.  Underwent percutaneous drainage  Date TSH LFTs Cr LDL  4/16 4.12 19     4/17  3.44 17 1.33   11/17 6.48  1.3 85  3/19 2.265 15 1.24    He  remains fatigued.  He has not regained appetite.  Denies shortness of breath or cough.  Chest CT 4/19 demonstrated interval improvement     Past Medical History:  Diagnosis Date  . Atrial arrhythmia/a fibrillation    not on coumadin 2/2 subdural hematoma  . Automatic implantable cardiac defibrillator in situ    medtronic  . Automatic implantable cardioverter-defibrillator in situ   . CHF (congestive heart failure) ([place])   . Chronic systolic congestive heart failure ([place])   . Dysrhythmia   . Encounter for long-term (current) use of other medications    amiodarone  . H/O hiatal hernia   . Hyperlipidemia   . Hypertension   . Hypothyroidism   . Ischemic cardiomyopathy    status post CABG with patent vein grafts and an atretic LIMA to his ramus, January 2012  . Myocardial infarction [facility])    1995, non-STEMI January 2012  . Pacemaker   . Subdural hematoma (HCC)    on coumadin  . Thyroid disease    hypothyroid  . Ventricular fibrillation ([place])   .  Ventricular tachycardia ([place])     VT Storm-January 2012- responded to Amiodarone    Past Surgical History:  Procedure Laterality Date  . bilateral cranitomies for sub hematomas    . BYPASS GRAFT  1995   5  . CHOLECYSTECTOMY N/A 12/26/2017   Procedure: LAPAROSCOPIC CHOLECYSTECTOMY;  Surgeon: Ileana Roup, MD;  Location: [facility];  Service: General;  Laterality: N/A;  . CORONARY ARTERY BYPASS GRAFT  1995   5  . IMPLANTABLE CARDIOVERTER DEFIBRILLATOR GENERATOR CHANGE N/A 03/28/2013   Procedure: IMPLANTABLE CARDIOVERTER DEFIBRILLATOR GENERATOR CHANGE;  Surgeon: Deboraha Sprang, MD;  Location: [facility] CATH LAB;  Service: Cardiovascular;  Laterality: N/A;  . INGUINAL HERNIA REPAIR Left 05/15/2014   Procedure: HERNIA REPAIR INGUINAL ADULT;  Surgeon: Zenovia Jarred, MD;  Location: [facility];  Service: General;  Laterality: Left;  . INSERT / REPLACE / REMOVE PACEMAKER  14    generator repl   . INSERTION OF MESH Left 05/15/2014   Procedure: INSERTION OF MESH;  Surgeon: Zenovia Jarred, MD;  Location: [facility];  Service: General;  Laterality: Left;  . IR GUIDED [place]  03/25/2018  . IR RADIOLOGIST EVAL & MGMT  04/07/2018  . TONSILLECTOMY      Current Outpatient Medications  Medication Sig Dispense Refill  . amiodarone (PACERONE) 100 MG tablet Take 0.5 tablets (50 mg total) by mouth daily. 45 tablet 2  . aspirin EC 81  MG tablet Take 81 mg by mouth daily.    [name] Kitchen b complex vitamins tablet Take 1 tablet by mouth daily.    . carvedilol (COREG) 6.25 MG tablet TAKE 1 TABLET BY MOUTH 2 TIMES DAILY WITH A MEAL. (Patient taking differently: TAKE 1 TABLET (6.25 MG) BY MOUTH 2 TIMES DAILY WITH A MEAL.) 180 tablet 2  . cefdinir (OMNICEF) 300 MG capsule Take 1 capsule (300 mg total) by mouth 2 (two) times daily. 20 capsule 0  . Coenzyme Q10 (COQ10 PO) Take 1 capsule by mouth daily.    [name] Kitchen donepezil (ARICEPT) 5 MG tablet Take 5 mg by mouth at bedtime.     . fenofibrate 160 MG tablet Take 1 tablet (160  mg total) by mouth daily. 90 tablet 3  . lactose free nutrition (BOOST PLUS) LIQD Take 237 mLs by mouth 3 (three) times daily with meals. 90 Can 0  . levothyroxine (SYNTHROID, LEVOTHROID) 75 MCG tablet Take 1 tablet (75 mcg total) by mouth daily before breakfast.  3  . lisinopril (PRINIVIL,ZESTRIL) 5 MG tablet Take 1 tablet (5 mg total) by mouth daily. (Patient taking differently: Take 2.5 mg by mouth 2 (two) times daily. ) 90 tablet 3  . LYCOPENE PO Take 1 capsule by mouth daily.    . methocarbamol (ROBAXIN) 500 MG tablet Take 2 tablets (1,000 mg total) by mouth every 6 (six) hours as needed for muscle spasms. 20 tablet 0  . Multiple Vitamin (MULTIVITAMIN WITH MINERALS) TABS tablet Take 1 tablet by mouth daily.    . nitroGLYCERIN (NITROSTAT) 0.4 MG SL tablet Place 1 tablet (0.4 mg total) under the tongue every 5 (five) minutes as needed for chest pain. For chest pain x 3 doses (Patient taking differently: Place 0.4 mg under the tongue every 5 (five) minutes as needed for chest pain (max 3 doses). ) 25 tablet 3  . ondansetron (ZOFRAN) 4 MG tablet Take 1 tablet (4 mg total) by mouth every 6 (six) hours as needed for nausea. 20 tablet 0  . OVER THE COUNTER MEDICATION Take 1 tablet by mouth daily. "prostrate essentials"    . spironolactone (ALDACTONE) 25 MG tablet Take 1 tablet (25 mg total) by mouth daily. 90 tablet 2  . Tamsulosin HCl (FLOMAX) 0.4 MG CAPS Take 0.4 mg by mouth at bedtime.     . traMADol (ULTRAM) 50 MG tablet Take 1 tablet (50 mg total) by mouth every 6 (six) hours as needed for moderate pain. 14 tablet 0   No current facility-administered medications for this visit.     No Known Allergies  Review of Systems negative except from HPI and PMH  Physical Exam BP 118/68   Pulse 74   Ht 5\' 11"  (1.803 m)   Wt 148 lb (67.1 kg)   SpO2 97%   BMI 20.64 kg/m  Well developed and nourished in no acute distress HENT normal Neck supple with JVP-flat Clear Regular rate and rhythm, no  murmurs or gallops Abd-soft with active BS No Clubbing cyanosis edema Skin-warm and dry A & Oriented  Grossly normal sensory and motor function      ECG personally reviewed a paced at 64 Intervals 24/17/45 Axis left -76 Right bundle branch block left anterior fascicular block    Assessment and  Plan  Ischemic cardiomyopathy  Congestive heart failure-chronic-systolic  Ventricular tachycardia  High risk medication surveillance  Hypothyroidism-treated  Fatigue and lassitude  Defibrillator-Medtronic  The patient's device was interrogated.  The information was reviewed. No changes were  made in the programming.    No intercurrent Ventricular tachycardia  Without symptoms of ischemia  Euvolemic continue current meds  No intercurrent atrial fibrillation or flutter  Tolerating amio  His lassitude is concerning in the context of his recent Enterobacter infection.  I think it is low likely that he has device infection but will check blood cultures today.  Review of her  We spent more than 50% of our >25 min visit in face to face counseling regarding the above

## 2018-04-12 NOTE — Patient Instructions (Addendum)
Medication Instructions:  Your physician recommends that you continue on your current medications as directed. Please refer to the Current Medication list given to you today.  Labwork: You will have blood cultures drawn today  Testing/Procedures: None ordered.  Follow-Up: Your physician recommends that you schedule a follow-up appointment in:   6 months with Chanetta Marshall   Remote monitoring is used to monitor your Pacemaker of ICD from home. This monitoring reduces the number of office visits required to check your device to one time per year. It allows [place] to keep an eye on the functioning of your device to ensure it is working properly. You are scheduled for a device check from home on 07/12/2018. You may send your transmission at any time that day. If you have a wireless device, the transmission will be sent automatically. After your physician reviews your transmission, you will receive a postcard with your next transmission date.    Any Other Special Instructions Will Be Listed Below (If Applicable).     If you need a refill on your cardiac medications before your next appointment, please call your pharmacy.

## 2018-04-16 ENCOUNTER — Telehealth: Payer: Self-pay | Admitting: Internal Medicine

## 2018-04-16 NOTE — Telephone Encounter (Signed)
New message  Pt wife verbalzied that she is calling for RN  Because she said that labs were done and she want to know if the results are back

## 2018-04-16 NOTE — Telephone Encounter (Signed)
Returned pts wife's call regarding his blood culture's. I communicated to her the preliminary reports look negative but it takes a few days for the full report to come back. She had no additional questions.

## 2018-04-18 LAB — CULTURE, BLOOD (SINGLE)

## 2018-04-26 ENCOUNTER — Ambulatory Visit (INDEPENDENT_AMBULATORY_CARE_PROVIDER_SITE_OTHER): Payer: Medicare Other | Admitting: Cardiovascular Disease

## 2018-04-26 ENCOUNTER — Encounter: Payer: Self-pay | Admitting: Cardiovascular Disease

## 2018-04-26 VITALS — BP 116/62 | HR 71 | Ht 71.0 in | Wt 155.0 lb

## 2018-04-26 DIAGNOSIS — I5032 Chronic diastolic (congestive) heart failure: Secondary | ICD-10-CM | POA: Diagnosis not present

## 2018-04-26 DIAGNOSIS — I2510 Atherosclerotic heart disease of native coronary artery without angina pectoris: Secondary | ICD-10-CM
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
## 2018-04-26 NOTE — Patient Instructions (Signed)

## 2018-04-27 LAB — FUNGUS CULTURE WITH STAIN

## 2018-04-27 LAB — FUNGUS CULTURE RESULT

## 2018-04-27 LAB — FUNGAL ORGANISM REFLEX

## 2018-04-29 DIAGNOSIS — L819 Disorder of pigmentation, unspecified: Secondary | ICD-10-CM | POA: Diagnosis not present

## 2018-04-29 DIAGNOSIS — L570 Actinic keratosis: Secondary | ICD-10-CM | POA: Diagnosis not present

## 2018-05-03 DIAGNOSIS — R634 Abnormal weight loss: Secondary | ICD-10-CM | POA: Diagnosis not present

## 2018-05-03 DIAGNOSIS — E038 Other specified hypothyroidism: Secondary | ICD-10-CM | POA: Diagnosis not present

## 2018-05-03 DIAGNOSIS — Z950 Presence of cardiac pacemaker: Secondary | ICD-10-CM | POA: Diagnosis not present

## 2018-05-03 DIAGNOSIS — R413 Other amnesia: Secondary | ICD-10-CM | POA: Diagnosis not present

## 2018-05-03 DIAGNOSIS — I5022 Chronic systolic (congestive) heart failure: Secondary | ICD-10-CM | POA: Diagnosis not present

## 2018-05-03 DIAGNOSIS — I4891 Unspecified atrial fibrillation: Secondary | ICD-10-CM | POA: Diagnosis not present

## 2018-05-03 DIAGNOSIS — E7849 Other hyperlipidemia: Secondary | ICD-10-CM | POA: Diagnosis not present

## 2018-05-03 DIAGNOSIS — M1990 Unspecified osteoarthritis, unspecified site: Secondary | ICD-10-CM | POA: Diagnosis not present

## 2018-05-03 DIAGNOSIS — I10 Essential (primary) hypertension: Secondary | ICD-10-CM | POA: Diagnosis not present

## 2018-05-03 DIAGNOSIS — I2510 Atherosclerotic heart disease of native coronary artery without angina pectoris: Secondary | ICD-10-CM | POA: Diagnosis not present

## 2018-05-03 DIAGNOSIS — R5381 Other malaise: Secondary | ICD-10-CM | POA: Diagnosis not present

## 2018-05-03 DIAGNOSIS — Z6823 Body mass index (BMI) 23.0-23.9, adult: Secondary | ICD-10-CM | POA: Diagnosis not present

## 2018-05-09 LAB — ACID FAST CULTURE WITH REFLEXED SENSITIVITIES: ACID FAST CULTURE - AFSCU3: NEGATIVE

## 2018-05-09 LAB — ACID FAST CULTURE WITH REFLEXED SENSITIVITIES (MYCOBACTERIA)

## 2018-05-19 DIAGNOSIS — R531 Weakness: Secondary | ICD-10-CM | POA: Diagnosis not present

## 2018-05-19 DIAGNOSIS — I5022 Chronic systolic (congestive) heart failure: Secondary | ICD-10-CM | POA: Diagnosis not present

## 2018-05-19 DIAGNOSIS — R5381 Other malaise: Secondary | ICD-10-CM | POA: Diagnosis not present

## 2018-05-19 DIAGNOSIS — R413 Other amnesia: Secondary | ICD-10-CM | POA: Diagnosis not present

## 2018-05-19 DIAGNOSIS — Z6822 Body mass index (BMI) 22.0-22.9, adult: Secondary | ICD-10-CM | POA: Diagnosis not present

## 2018-05-19 DIAGNOSIS — I951 Orthostatic hypotension: Secondary | ICD-10-CM | POA: Diagnosis not present

## 2018-05-20 ENCOUNTER — Encounter: Payer: Self-pay | Admitting: Neurology

## 2018-07-05 ENCOUNTER — Ambulatory Visit (INDEPENDENT_AMBULATORY_CARE_PROVIDER_SITE_OTHER): Payer: Medicare Other | Admitting: Orthopedic Surgery

## 2018-07-05 ENCOUNTER — Encounter (INDEPENDENT_AMBULATORY_CARE_PROVIDER_SITE_OTHER): Payer: Self-pay | Admitting: Orthopedic Surgery

## 2018-07-05 ENCOUNTER — Ambulatory Visit (INDEPENDENT_AMBULATORY_CARE_PROVIDER_SITE_OTHER): Payer: Medicare Other

## 2018-07-05 DIAGNOSIS — G8929 Other chronic pain: Secondary | ICD-10-CM | POA: Diagnosis not present

## 2018-07-05 DIAGNOSIS — M12811 Other specific arthropathies, not elsewhere classified, right shoulder: Secondary | ICD-10-CM | POA: Diagnosis not present

## 2018-07-05 DIAGNOSIS — M25511 Pain in right shoulder: Secondary | ICD-10-CM

## 2018-07-05 DIAGNOSIS — I2510 Atherosclerotic heart disease of native coronary artery without angina pectoris: Secondary | ICD-10-CM

## 2018-07-05 MED ORDER — METHYLPREDNISOLONE ACETATE 40 MG/ML IJ SUSP
40.0000 mg | INTRAMUSCULAR | Status: AC | PRN
  Administered 2018-07-05: 40 mg via INTRA_ARTICULAR

## 2018-07-05 MED ORDER — BUPIVACAINE HCL 0.5 % IJ SOLN
9.0000 mL | INTRAMUSCULAR | Status: AC | PRN
  Administered 2018-07-05: 9 mL via INTRA_ARTICULAR

## 2018-07-05 MED ORDER — LIDOCAINE HCL 1 % IJ SOLN
5.0000 mL | INTRAMUSCULAR | Status: AC | PRN
  Administered 2018-07-05: 5 mL
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
## 2018-07-12 ENCOUNTER — Encounter: Payer: Medicare Other | Admitting: *Deleted

## 2018-07-14 ENCOUNTER — Encounter: Payer: Self-pay | Admitting: Cardiology

## 2018-07-22 ENCOUNTER — Ambulatory Visit (INDEPENDENT_AMBULATORY_CARE_PROVIDER_SITE_OTHER): Payer: Medicare Other | Admitting: *Deleted

## 2018-07-22 DIAGNOSIS — I472 Ventricular tachycardia, unspecified: Secondary | ICD-10-CM

## 2018-07-22 DIAGNOSIS — R05 Cough: Secondary | ICD-10-CM | POA: Diagnosis not present

## 2018-07-22 DIAGNOSIS — I10 Essential (primary) hypertension: Secondary | ICD-10-CM | POA: Diagnosis not present

## 2018-07-22 DIAGNOSIS — I255 Ischemic cardiomyopathy: Secondary | ICD-10-CM

## 2018-07-22 DIAGNOSIS — I5022 Chronic systolic (congestive) heart failure: Secondary | ICD-10-CM | POA: Diagnosis not present

## 2018-07-22 DIAGNOSIS — N189 Chronic kidney disease, unspecified: Secondary | ICD-10-CM | POA: Diagnosis not present

## 2018-07-22 DIAGNOSIS — Z6822 Body mass index (BMI) 22.0-22.9, adult: Secondary | ICD-10-CM | POA: Diagnosis not present

## 2018-07-23 NOTE — Progress Notes (Signed)
Remote ICD transmission.   

## 2018-08-03 ENCOUNTER — Other Ambulatory Visit: Payer: Self-pay

## 2018-08-03 ENCOUNTER — Ambulatory Visit (INDEPENDENT_AMBULATORY_CARE_PROVIDER_SITE_OTHER): Payer: Medicare Other | Admitting: Neurology

## 2018-08-03 ENCOUNTER — Encounter: Payer: Self-pay | Admitting: Neurology

## 2018-08-03 VITALS — BP 90/52 | HR 60 | Ht 71.5 in | Wt 148.0 lb

## 2018-08-03 DIAGNOSIS — G3184 Mild cognitive impairment, so stated: Secondary | ICD-10-CM | POA: Diagnosis not present

## 2018-08-03 DIAGNOSIS — I255 Ischemic cardiomyopathy: Secondary | ICD-10-CM | POA: Diagnosis not present

## 2018-08-03 DIAGNOSIS — R413 Other amnesia: Secondary | ICD-10-CM

## 2018-08-03 NOTE — Patient Instructions (Addendum)
1. Schedule head CT without contrast  We have sent a referral to [facility] for your CT and they will call you directly to schedule your appt. They are located at [place]. If you need to contact them directly please call [phone].   2. Bloodwork for TSH, B12  Unfortunately, our lab is closed until Thursday.  If you would like, you can take the lab order to any lab (even your primary care physician) and have the labs drawn.  OR if you would like, you can come back after August 7th to have the labs drawn.  If you decide to come back to our lab, please make sure to check in with our front office ([id number]) and the receptionist will direct you to our laboratory.   3. Continue Donepezil (Aricept) 5mg  daily 4. Follow-up in 6 months or so, call for any changes  FALL PRECAUTIONS: Be cautious when walking. Scan the area for obstacles that may increase the risk of trips and falls. When getting up in the mornings, sit up at the edge of the bed for a few minutes before getting out of bed. Consider elevating the bed at the head end to avoid drop of blood pressure when getting up. Walk always in a well-lit room (use night lights in the walls). Avoid area rugs or power cords from appliances in the middle of the walkways. Use a walker or a cane if necessary and consider physical therapy for balance exercise. Get your eyesight checked regularly.  FINANCIAL OVERSIGHT: Supervision, especially oversight when making financial decisions or transactions is also recommended.  HOME SAFETY: Consider the safety of the kitchen when operating appliances like stoves, microwave oven, and blender. Consider having supervision and share cooking responsibilities until no longer able to participate in those. Accidents with firearms and other hazards in the house should be identified and addressed as well.  DRIVING: Regarding driving, in patients with progressive memory problems, driving will be impaired. We  advise to have someone else do the driving if trouble finding directions or if minor accidents are reported. Independent driving assessment is available to determine safety of driving.  ABILITY TO BE LEFT ALONE: If patient is unable to contact 911 operator, consider using LifeLine, or when the need is there, arrange for someone to stay with patients. Smoking is a fire hazard, consider supervision or cessation. Risk of wandering should be assessed by caregiver and if detected at any point, supervision and safe proof recommendations should be instituted.  MEDICATION SUPERVISION: Inability to self-administer medication needs to be constantly addressed. Implement a mechanism to ensure safe administration of the medications.  RECOMMENDATIONS FOR ALL PATIENTS WITH MEMORY PROBLEMS: 1. Continue to exercise (Recommend 30 minutes of walking everyday, or 3 hours every week) 2. Increase social interactions - continue going to [place] and enjoy social gatherings with friends and family 3. Eat healthy, avoid fried foods and eat more fruits and vegetables 4. Maintain adequate blood pressure, blood sugar, and blood cholesterol level. Reducing the risk of stroke and cardiovascular disease also helps promoting better memory. 5. Avoid stressful situations. Live a simple life and avoid aggravations. Organize your time and prepare for the next day in anticipation. 6. Sleep well, avoid any interruptions of sleep and avoid any distractions in the bedroom that may interfere with adequate sleep quality 7. Avoid sugar, avoid sweets as there is a strong link between excessive sugar intake, diabetes, and cognitive impairment We discussed the Mediterranean diet, which has been shown to  help patients reduce the risk of progressive memory disorders and reduces cardiovascular risk. This includes eating fish, eat fruits and green leafy vegetables, nuts like almonds and hazelnuts, walnuts, and also use olive oil. Avoid fast foods and  fried foods as much as possible. Avoid sweets and sugar as sugar use has been linked to worsening of memory function.  There is always a concern of gradual progression of memory problems. If this is the case, then we may need to adjust level of care according to patient needs. Support, both to the patient and caregiver, should then be put into place.

## 2018-08-03 NOTE — Progress Notes (Signed)
NEUROLOGY CONSULTATION NOTE  SA[place]Y [place] MRN: [id number] DOB: [birth year]  Referring provider: Toy Baker, NP Primary care provider: Dr. Burnard Bunting  Reason for consult:  Memory loss  Thank you for your kind referral of [name] for consultation of the above symptoms. Although his history is well known to you, please allow me to reiterate it for the purpose of our medical record. The patient was accompanied to the clinic by his wife and daughter who also provide collateral information. Records and images were personally reviewed where available.  HISTORY OF PRESENT ILLNESS: This is an 83 year old right-handed man with a history of hypertension, hyperlipidemia, CAD, atrial fibrillation s/p PPM, subdural hematoma s/p craniotomy in 2006, presenting for evaluation of worsening memory. He denies any issues but states "I have some things I forget." His wife started noticing memory changes around the end of December 2018, he was not feeling well and had gall bladder surgery. He got confused during his hospital stay and has improved since then but took a while. His daughter reports he repeats himself. He continues to drive without getting lost. Family denies any driving concerns. His wife fills his pillbox and he takes his medications by himself. His wife manages finances. He is independent with dressing and bathing. No paranoia or hallucinations, there is some irritability. No family history of dementia. No alcohol use. He has been taking Aricept 5mg  daily since January 2019 with no side effects.   His wife reports his main problem currently is keeping him hydrated. They can tell a difference in cognition when he is dehydrated. He has lost weight and appetite and struggles to get his weight back up. He denies any headaches, dizziness, vision changes, dysarthria/dysphagia, neck/back pain, focal numbness/tingling/weakness, bowel/bladder dysfunction, anosmia, or tremors. Sleep is  good, no wandering behavior. He had a significant headache in 10/2005 and was found to have large bilateral subdural hematomas, underwent bilateral craniotomy. He was on Coumadin at that time which was discontinued.    PAST MEDICAL HISTORY: Past Medical History:  Diagnosis Date  . Atrial arrhythmia/a fibrillation    not on coumadin 2/2 subdural hematoma  . Automatic implantable cardiac defibrillator in situ    medtronic  . Automatic implantable cardioverter-defibrillator in situ   . CHF (congestive heart failure) ([place])   . Chronic systolic congestive heart failure ([place])   . Dysrhythmia   . Encounter for long-term (current) use of other medications    amiodarone  . H/O hiatal hernia   . Hyperlipidemia   . Hypertension   . Hypothyroidism   . Ischemic cardiomyopathy    status post CABG with patent vein grafts and an atretic LIMA to his ramus, January 2012  . Myocardial infarction [facility])    1995, non-STEMI January 2012  . Pacemaker   . Subdural hematoma (HCC)    on coumadin  . Thyroid disease    hypothyroid  . Ventricular fibrillation ([place])   . Ventricular tachycardia ([place])     VT Storm-January 2012- responded to Amiodarone    PAST SURGICAL HISTORY: Past Surgical History:  Procedure Laterality Date  . bilateral cranitomies for sub hematomas    . BYPASS GRAFT  1995   5  . CHOLECYSTECTOMY N/A 12/26/2017   Procedure: LAPAROSCOPIC CHOLECYSTECTOMY;  Surgeon: Ileana Roup, MD;  Location: [facility];  Service: General;  Laterality: N/A;  . CORONARY ARTERY BYPASS GRAFT  1995   5  . IMPLANTABLE CARDIOVERTER DEFIBRILLATOR GENERATOR CHANGE N/A 03/28/2013   Procedure: IMPLANTABLE  CARDIOVERTER DEFIBRILLATOR GENERATOR CHANGE;  Surgeon: Deboraha Sprang, MD;  Location: [facility] CATH LAB;  Service: Cardiovascular;  Laterality: N/A;  . INGUINAL HERNIA REPAIR Left 05/15/2014   Procedure: HERNIA REPAIR INGUINAL ADULT;  Surgeon: Zenovia Jarred, MD;  Location: [facility];  Service: General;  Laterality:  Left;  . INSERT / REPLACE / REMOVE PACEMAKER  14    generator repl   . INSERTION OF MESH Left 05/15/2014   Procedure: INSERTION OF MESH;  Surgeon: Zenovia Jarred, MD;  Location: [facility];  Service: General;  Laterality: Left;  . IR GUIDED [place]  03/25/2018  . IR RADIOLOGIST EVAL & MGMT  04/07/2018  . TONSILLECTOMY      MEDICATIONS: Current Outpatient Medications on File Prior to Visit  Medication Sig Dispense Refill  . amiodarone (PACERONE) 100 MG tablet Take 0.5 tablets (50 mg total) by mouth daily. 45 tablet 2  . aspirin EC 81 MG tablet Take 81 mg by mouth daily.    [name] Kitchen b complex vitamins tablet Take 1 tablet by mouth daily.    . carvedilol (COREG) 6.25 MG tablet TAKE 1 TABLET BY MOUTH 2 TIMES DAILY WITH A MEAL. 180 tablet 3  . cefdinir (OMNICEF) 300 MG capsule Take 1 capsule (300 mg total) by mouth 2 (two) times daily. 20 capsule 0  . Coenzyme Q10 (COQ10 PO) Take 1 capsule by mouth daily.    [name] Kitchen donepezil (ARICEPT) 5 MG tablet Take 5 mg by mouth at bedtime.     . fenofibrate 160 MG tablet Take 1 tablet (160 mg total) by mouth daily. 90 tablet 3  . lactose free nutrition (BOOST PLUS) LIQD Take 237 mLs by mouth 3 (three) times daily with meals. 90 Can 0  . levothyroxine (SYNTHROID, LEVOTHROID) 75 MCG tablet Take 1 tablet (75 mcg total) by mouth daily before breakfast.  3  . lisinopril (PRINIVIL,ZESTRIL) 5 MG tablet Take 1 tablet (5 mg total) by mouth daily. (Patient taking differently: Take 2.5 mg by mouth 2 (two) times daily. ) 90 tablet 3  . LYCOPENE PO Take 1 capsule by mouth daily.    . methocarbamol (ROBAXIN) 500 MG tablet Take 2 tablets (1,000 mg total) by mouth every 6 (six) hours as needed for muscle spasms. 20 tablet 0  . Multiple Vitamin (MULTIVITAMIN WITH MINERALS) TABS tablet Take 1 tablet by mouth daily.    . nitroGLYCERIN (NITROSTAT) 0.4 MG SL tablet Place 1 tablet (0.4 mg total) under the tongue every 5 (five) minutes as needed for chest pain. For chest pain x  3 doses (Patient taking differently: Place 0.4 mg under the tongue every 5 (five) minutes as needed for chest pain (max 3 doses). ) 25 tablet 3  . ondansetron (ZOFRAN) 4 MG tablet Take 1 tablet (4 mg total) by mouth every 6 (six) hours as needed for nausea. 20 tablet 0  . OVER THE COUNTER MEDICATION Take 1 tablet by mouth daily. "prostrate essentials"    . spironolactone (ALDACTONE) 25 MG tablet Take 1 tablet (25 mg total) by mouth daily. 90 tablet 2  . Tamsulosin HCl (FLOMAX) 0.4 MG CAPS Take 0.4 mg by mouth at bedtime.     . traMADol (ULTRAM) 50 MG tablet Take 1 tablet (50 mg total) by mouth every 6 (six) hours as needed for moderate pain. 14 tablet 0   No current facility-administered medications on file prior to visit.     ALLERGIES: No Known Allergies  FAMILY HISTORY: Family History  Problem Relation  Age of Onset  . Cerebral aneurysm Father   . Hypertension Father     SOCIAL HISTORY: Social History   Socioeconomic History  . Marital status: Married    Spouse name: Not on file  . Number of children: Not on file  . Years of education: Not on file  . Highest education level: Not on file  Occupational History  . Not on file  Social Needs  . Financial resource strain: Not on file  . Food insecurity:    Worry: Not on file    Inability: Not on file  . Transportation needs:    Medical: Not on file    Non-medical: Not on file  Tobacco Use  . Smoking status: Never Smoker  . Smokeless tobacco: Former Network engineer and Sexual Activity  . Alcohol use: No  . Drug use: No  . Sexual activity: Not on file  Lifestyle  . Physical activity:    Days per week: Not on file    Minutes per session: Not on file  . Stress: Not on file  Relationships  . Social connections:    Talks on phone: Not on file    Gets together: Not on file    Attends religious service: Not on file    Active member of club or organization: Not on file    Attends meetings of clubs or organizations: Not on  file    Relationship status: Not on file  . Intimate partner violence:    Fear of current or ex partner: Not on file    Emotionally abused: Not on file    Physically abused: Not on file    Forced sexual activity: Not on file  Other Topics Concern  . Not on file  Social History Narrative  . Not on file    REVIEW OF SYSTEMS: Constitutional: No fevers, chills, or sweats, no generalized fatigue, change in appetite Eyes: No visual changes, double vision, eye pain Ear, nose and throat: No hearing loss, ear pain, nasal congestion, sore throat Cardiovascular: No chest pain, palpitations Respiratory:  No shortness of breath at rest or with exertion, wheezes GastrointestinaI: No nausea, vomiting, diarrhea, abdominal pain, fecal incontinence Genitourinary:  No dysuria, urinary retention or frequency Musculoskeletal:  No neck pain, back pain Integumentary: No rash, pruritus, skin lesions Neurological: as above Psychiatric: No depression, insomnia, anxiety Endocrine: No palpitations, fatigue, diaphoresis, mood swings, change in appetite, change in weight, increased thirst Hematologic/Lymphatic:  No anemia, purpura, petechiae. Allergic/Immunologic: no itchy/runny eyes, nasal congestion, recent allergic reactions, rashes  PHYSICAL EXAM: Vitals:   08/03/18 1352  BP: (!) 90/52  Pulse: 60  SpO2: (!) 89%   General: No acute distress Head:  Normocephalic/atraumatic Eyes: Fundoscopic exam shows bilateral sharp discs, no vessel changes, exudates, or hemorrhages Neck: supple, no paraspinal tenderness, full range of motion Back: No paraspinal tenderness Heart: regular rate and rhythm Lungs: Clear to auscultation bilaterally. Vascular: No carotid bruits. Skin/Extremities: No rash, no edema Neurological Exam:ecent and re Mental status: alert and oriented to person, place, and time, no dysarthria or aphasia, Fund of knowledge is appropriate.  Remote memory intact.  Attention and concentration are  normal.    Able to name objects and repeat phrases. CDT4/5 MMSE - Mini Mental State Exam 08/03/2018  Orientation to time 4  Orientation to Place 4  Registration 3  Attention/ Calculation 5  Recall 0  Language- name 2 objects 2  Language- repeat 1  Language- follow 3 step command 3  Language- read &  follow direction 1  Write a sentence 1  Copy design 1  Total score 25   Cranial nerves: CN I: not tested CN II: pupils equal, round and reactive to light, visual fields intact, fundi unremarkable. CN III, IV, VI:  full range of motion, no nystagmus, no ptosis CN V: facial sensation intact CN VII: upper and lower face symmetric CN VIII: hearing intact to finger rub CN IX, X: gag intact, uvula midline CN XI: sternocleidomastoid and trapezius muscles intact CN XII: tongue midline Bulk & Tone: normal, no fasciculations. Motor: 5/5 throughout with no pronator drift. Sensation: intact to light touch, cold, pin, vibration and joint position sense.  No extinction to double simultaneous stimulation.  Romberg test negative Deep Tendon Reflexes: +2 throughout except for absent ankle jerks bilaterally, no ankle clonus Plantar responses: downgoing bilaterally Cerebellar: no incoordination on finger to nose testing Gait: hunched posture, wide-based gait with right food everted while walking Tremor: none  IMPRESSION: This is an 83 year old right-handed man with a history of  hypertension, hyperlipidemia, CAD, atrial fibrillation s/p PPM, subdural hematoma s/p craniotomy in 2006, presenting for evaluation of worsening memory. His neurological exam is non-focal, MMSE today 25/30. Symptoms suggestive of mild cognitive impairment, he does not seem to have difficulties with complex tasks by history. We discussed different causes of memory loss, check TSH and B12. Head CT without contrast will be ordered to assess for underlying structural abnormality. Continue Donepezil 5mg  daily, we discussed side effects  and expectations from medication. We discussed the importance of control of vascular risk factors, physical exercise, and brain stimulation exercises for brain health. Follow-up in 6 months, they know to call for any changes.   Thank you for allowing me to participate in the care of this patient. Please do not hesitate to call for any questions or concerns.   [name], M.D.  CC: Dr. Reynaldo Minium, Toy Baker NP

## 2018-08-04 LAB — VITAMIN B12: VITAMIN B 12: 535 pg/mL (ref 200–1100)

## 2018-08-04 LAB — TSH: TSH: 1.99 m[IU]/L (ref 0.40–4.50)

## 2018-08-05 ENCOUNTER — Telehealth: Payer: Self-pay

## 2018-08-05 NOTE — Telephone Encounter (Signed)
Call pt.  No answer.  Voice mail not set up.  Unable to relay message below.

## 2018-08-05 NOTE — Telephone Encounter (Signed)
-----   Message from Cameron Sprang, MD sent at 08/05/2018  9:30 AM EDT ----- Pls let him know thyroid and B12 levels are normal. Thanks

## 2018-08-11 ENCOUNTER — Encounter: Payer: Self-pay | Admitting: Neurology

## 2018-08-16 ENCOUNTER — Ambulatory Visit
Admission: RE | Admit: 2018-08-16 | Discharge: 2018-08-16 | Disposition: A | Discharge status: Home / Self Care | Payer: Medicare Other | Source: Ambulatory Visit | Attending: Neurology | Admitting: Neurology

## 2018-08-16 DIAGNOSIS — R413 Other amnesia: Secondary | ICD-10-CM

## 2018-08-16 MED ORDER — IOPAMIDOL (ISOVUE-300) INJECTION 61%
75.0000 mL | Freq: Once | INTRAVENOUS | Status: AC | PRN
  Administered 2018-08-16: 75 mL via INTRAVENOUS

## 2018-08-30 LAB — CUP PACEART REMOTE DEVICE CHECK
Battery Remaining Longevity: 50 mo
Battery Voltage: 2.98 V
Brady Statistic AS VP Percent: 2.51 %
Brady Statistic RA Percent Paced: 72.56 %
Brady Statistic RV Percent Paced: 6.42 %
HIGH POWER IMPEDANCE MEASURED VALUE: 38 Ohm
HIGH POWER IMPEDANCE MEASURED VALUE: 52 Ohm
Implantable Lead Implant Date: 19990520
Implantable Lead Model: 6940
Implantable Lead Model: 6947
Implantable Pulse Generator Implant Date: 20140331
Lead Channel Impedance Value: 342 Ohm
Lead Channel Impedance Value: 437 Ohm
Lead Channel Pacing Threshold Amplitude: 0.75 V
Lead Channel Pacing Threshold Pulse Width: 0.4 ms
Lead Channel Pacing Threshold Pulse Width: 0.4 ms
Lead Channel Sensing Intrinsic Amplitude: 7.375 mV
Lead Channel Setting Pacing Amplitude: 2.5 V
Lead Channel Setting Pacing Pulse Width: 0.4 ms
Lead Channel Setting Sensing Sensitivity: 0.3 mV
MDC IDC LEAD IMPLANT DT: 20011228
MDC IDC LEAD LOCATION: 753859
MDC IDC LEAD LOCATION: 753860
MDC IDC MSMT LEADCHNL RA SENSING INTR AMPL: 1 mV
MDC IDC MSMT LEADCHNL RA SENSING INTR AMPL: 1 mV
MDC IDC MSMT LEADCHNL RV IMPEDANCE VALUE: 456 Ohm
MDC IDC MSMT LEADCHNL RV PACING THRESHOLD AMPLITUDE: 0.75 V
MDC IDC MSMT LEADCHNL RV SENSING INTR AMPL: 7.375 mV
MDC IDC SESS DTM: 20190725152928
MDC IDC SET LEADCHNL RA PACING AMPLITUDE: 2 V
MDC IDC STAT BRADY AP VP PERCENT: 4.01 %
MDC IDC STAT BRADY AP VS PERCENT: 71.3 %
MDC IDC STAT BRADY AS VS PERCENT: 22.18 %

## 2018-09-02 DIAGNOSIS — L814 Other melanin hyperpigmentation: Secondary | ICD-10-CM | POA: Diagnosis not present

## 2018-09-02 DIAGNOSIS — Z85828 Personal history of other malignant neoplasm of skin: Secondary | ICD-10-CM | POA: Diagnosis not present

## 2018-09-02 DIAGNOSIS — D485 Neoplasm of uncertain behavior of skin: Secondary | ICD-10-CM | POA: Diagnosis not present

## 2018-09-02 DIAGNOSIS — C4441 Basal cell carcinoma of skin of scalp and neck: Secondary | ICD-10-CM | POA: Diagnosis not present

## 2018-09-02 DIAGNOSIS — D229 Melanocytic nevi, unspecified: Secondary | ICD-10-CM | POA: Diagnosis not present

## 2018-09-02 DIAGNOSIS — L570 Actinic keratosis: Secondary | ICD-10-CM | POA: Diagnosis not present

## 2018-09-02 DIAGNOSIS — L821 Other seborrheic keratosis: Secondary | ICD-10-CM | POA: Diagnosis not present

## 2018-09-02 DIAGNOSIS — L578 Other skin changes due to chronic exposure to nonionizing radiation: Secondary | ICD-10-CM | POA: Diagnosis not present

## 2018-09-26 ENCOUNTER — Other Ambulatory Visit: Payer: Self-pay | Admitting: Cardiovascular Disease

## 2018-09-30 ENCOUNTER — Other Ambulatory Visit: Payer: Self-pay

## 2018-09-30 MED ORDER — AMIODARONE HCL 100 MG PO TABS: 50.0000 mg | ORAL_TABLET | Freq: Every day | ORAL | 1 refills | Status: DC

## 2018-10-21 ENCOUNTER — Ambulatory Visit (INDEPENDENT_AMBULATORY_CARE_PROVIDER_SITE_OTHER): Payer: Medicare Other | Admitting: *Deleted

## 2018-10-21 DIAGNOSIS — I472 Ventricular tachycardia, unspecified: Secondary | ICD-10-CM

## 2018-10-21 DIAGNOSIS — I255 Ischemic cardiomyopathy: Secondary | ICD-10-CM

## 2018-10-21 NOTE — Progress Notes (Signed)
Remote ICD transmission.   

## 2018-10-25 ENCOUNTER — Other Ambulatory Visit: Payer: Medicare Other | Admitting: *Deleted

## 2018-10-25 ENCOUNTER — Ambulatory Visit (INDEPENDENT_AMBULATORY_CARE_PROVIDER_SITE_OTHER): Payer: Medicare Other | Admitting: Cardiovascular Disease

## 2018-10-25 ENCOUNTER — Encounter: Payer: Self-pay | Admitting: Cardiovascular Disease

## 2018-10-25 VITALS — BP 112/62 | HR 75 | Ht 71.0 in | Wt 156.0 lb

## 2018-10-25 DIAGNOSIS — I2510 Atherosclerotic heart disease of native coronary artery without angina pectoris: Secondary | ICD-10-CM

## 2018-10-25 DIAGNOSIS — I255 Ischemic cardiomyopathy: Secondary | ICD-10-CM | POA: Diagnosis not present

## 2018-10-25 DIAGNOSIS — E782 Mixed hyperlipidemia: Secondary | ICD-10-CM | POA: Diagnosis not present

## 2018-10-25 DIAGNOSIS — I5032 Chronic diastolic (congestive) heart failure: Secondary | ICD-10-CM | POA: Diagnosis not present

## 2018-10-25 DIAGNOSIS — I5022 Chronic systolic (congestive) heart failure: Secondary | ICD-10-CM | POA: Diagnosis not present

## 2018-10-25 LAB — BASIC METABOLIC PANEL
BUN/Creatinine Ratio: 11 (ref 10–24)
BUN: 13 mg/dL (ref 8–27)
CO2: 23 mmol/L (ref 20–29)
Calcium: 8.9 mg/dL (ref 8.6–10.2)
Chloride: 104 mmol/L (ref 96–106)
Creatinine, Ser: 1.21 mg/dL (ref 0.76–1.27)
GFR calc Af Amer: 65 mL/min/{1.73_m2} (ref >59)
GFR, EST NON AFRICAN AMERICAN: 56 mL/min/{1.73_m2} — AB (ref >59)
GLUCOSE: 90 mg/dL (ref 65–99)
POTASSIUM: 3.9 mmol/L (ref 3.5–5.2)
Sodium: 141 mmol/L (ref 134–144)

## 2018-10-25 LAB — HEPATIC FUNCTION PANEL
ALBUMIN: 3.8 g/dL (ref 3.5–4.7)
ALT: 12 IU/L (ref 0–44)
AST: 19 IU/L (ref 0–40)
Alkaline Phosphatase: 28 IU/L — ABNORMAL LOW (ref 39–117)
BILIRUBIN TOTAL: 0.9 mg/dL (ref 0.0–1.2)
Bilirubin, Direct: 0.39 mg/dL (ref 0.00–0.40)
TOTAL PROTEIN: 6.2 g/dL (ref 6.0–8.5)

## 2018-10-25 LAB — LIPID PANEL
CHOLESTEROL TOTAL: 113 mg/dL (ref 100–199)
Chol/HDL Ratio: 3.2 ratio (ref 0.0–5.0)
HDL: 35 mg/dL — ABNORMAL LOW (ref >39)
LDL CALC: 69 mg/dL (ref 0–99)
Triglycerides: 47 mg/dL (ref 0–149)
VLDL Cholesterol Cal: 9 mg/dL (ref 5–40)

## 2018-10-25 NOTE — Patient Instructions (Signed)
Medication Instructions:  Your physician recommends that you continue on your current medications as directed. Please refer to the Current Medication list given to you today.  If you need a refill on your cardiac medications before your next appointment, please call your pharmacy.   Lab work: Your physician recommends that you return for lab work in: 6 months on the day of or a few days before your office visit with Dr. Acie Fredrickson.  You will need to FAST for this appointment - nothing to eat or drink after midnight the night before except water.   If you have labs (blood work) drawn today and your tests are completely normal, you will receive your results only by: [name] Kitchen MyChart Message (if you have MyChart) OR . A paper copy in the mail If you have any lab test that is abnormal or we need to change your treatment, we will call you to review the results.   Testing/Procedures: None Ordered    Follow-Up: At [facility], you and your health needs are our priority.  As part of our continuing mission to provide you with exceptional heart care, we have created designated Provider Care Teams.  These Care Teams include your primary Cardiologist (physician) and Advanced Practice Providers (APPs -  Physician Assistants and Nurse Practitioners) who all work together to provide you with the care you need, when you need it. You will need a follow up appointment in:  6 months.  Please call our office 2 months in advance to schedule this appointment.  You may see Mertie Moores, MD or one of the following Advanced Practice Providers on your designated Care Team: Richardson Dopp, PA-C [place], [place] . Daune Perch, NP
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
## 2018-11-03 DIAGNOSIS — E038 Other specified hypothyroidism: Secondary | ICD-10-CM | POA: Diagnosis not present

## 2018-11-03 DIAGNOSIS — R82998 Other abnormal findings in urine: Secondary | ICD-10-CM | POA: Diagnosis not present

## 2018-11-03 DIAGNOSIS — Z125 Encounter for screening for malignant neoplasm of prostate: Secondary | ICD-10-CM | POA: Diagnosis not present

## 2018-11-03 DIAGNOSIS — I10 Essential (primary) hypertension: Secondary | ICD-10-CM | POA: Diagnosis not present

## 2018-11-09 IMAGING — US US ABDOMEN LIMITED
1 series · 14 of 25 positions shown · non-contrast
Comparison: CT scan of same day.

CLINICAL DATA: Biliary colic.

EXAM:
ULTRASOUND ABDOMEN LIMITED RIGHT UPPER QUADRANT

[Series 1: us abdomen limited · 0.21mm/px · 14 of 61 slices shown]
[im 1/61]
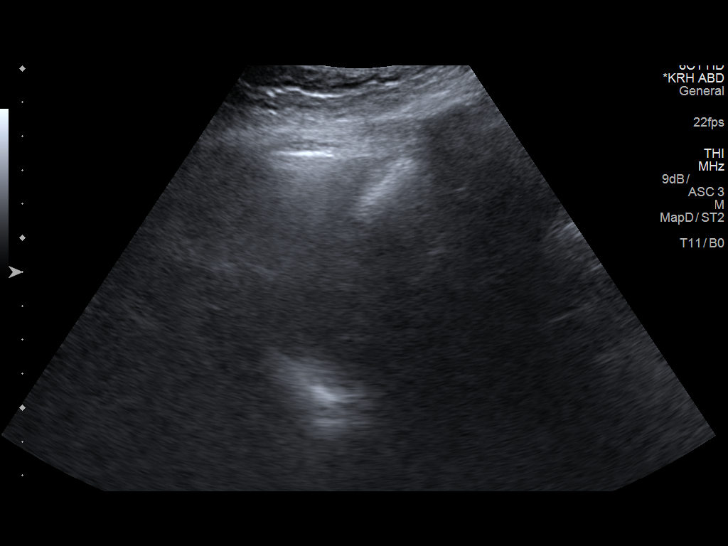
[im 6/61]
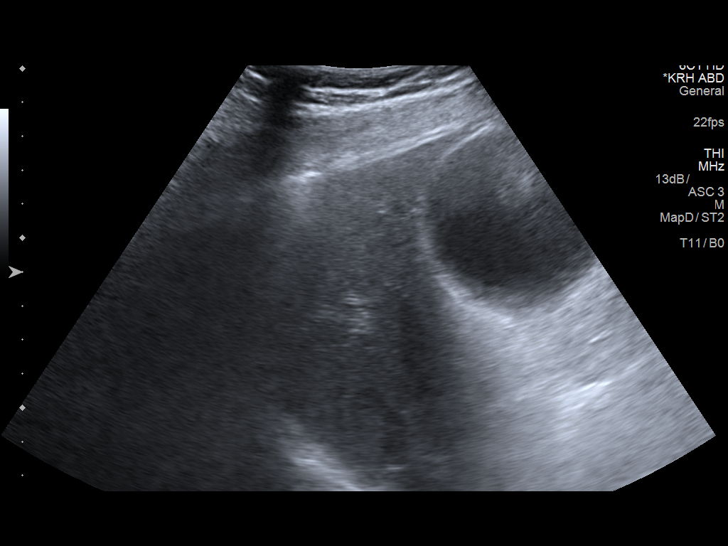
[im 11/61]
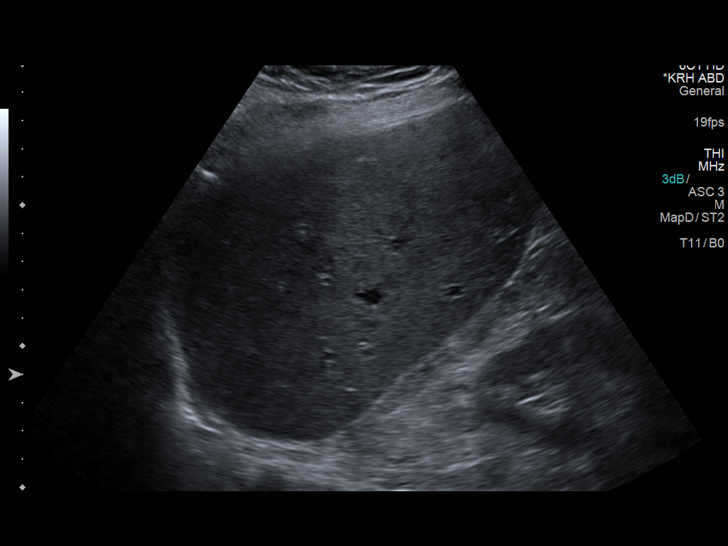
[im 16/61]
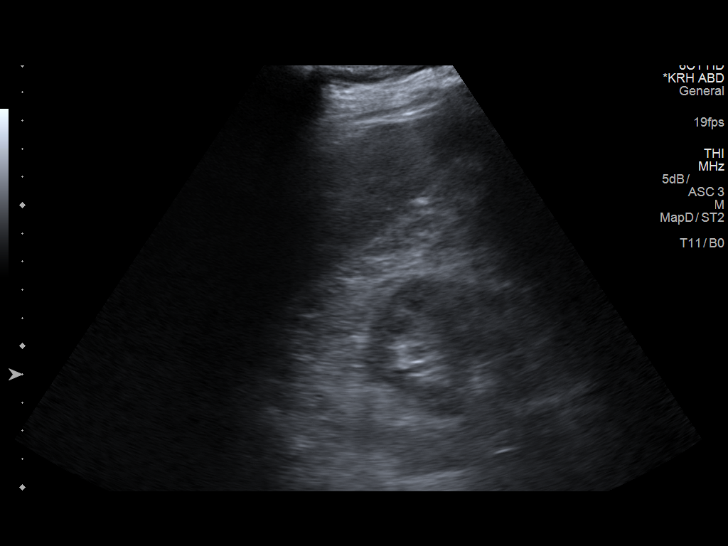
[im 21/61]
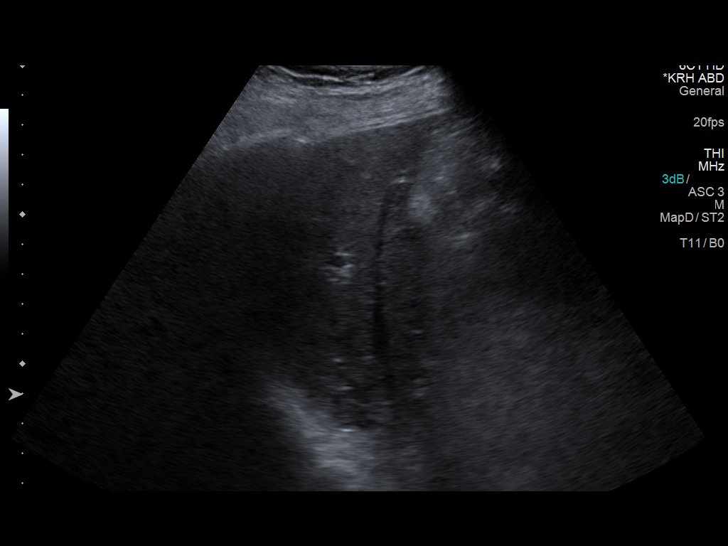
[im 23/61]
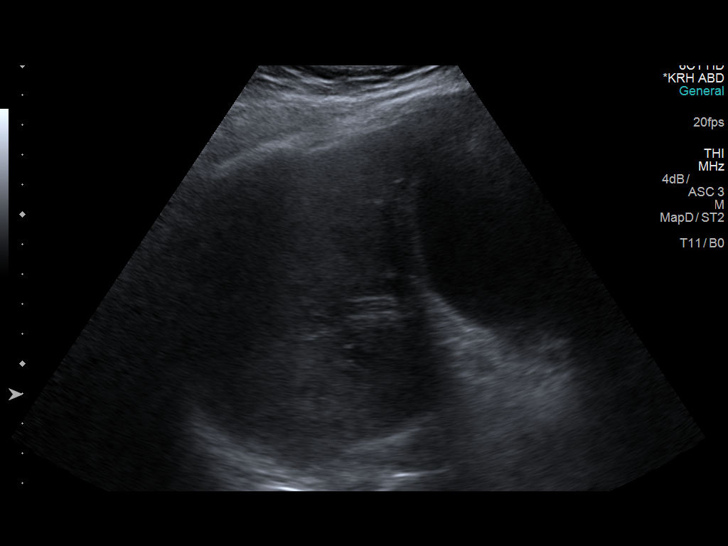
[im 28/61]
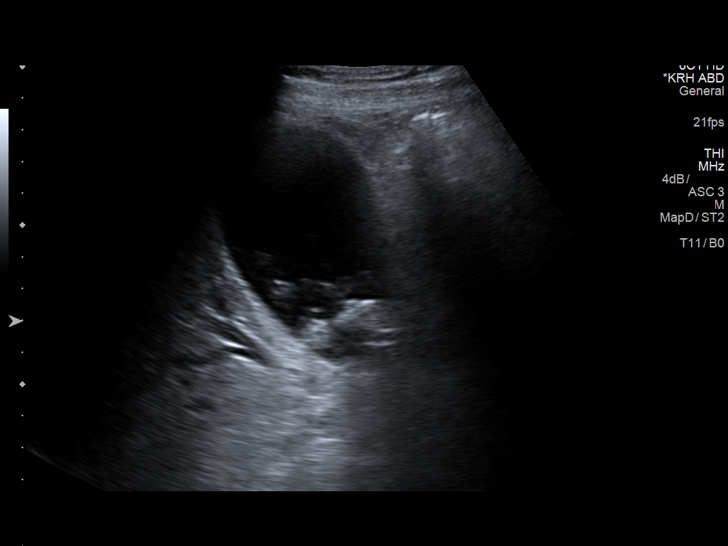
[im 33/61]
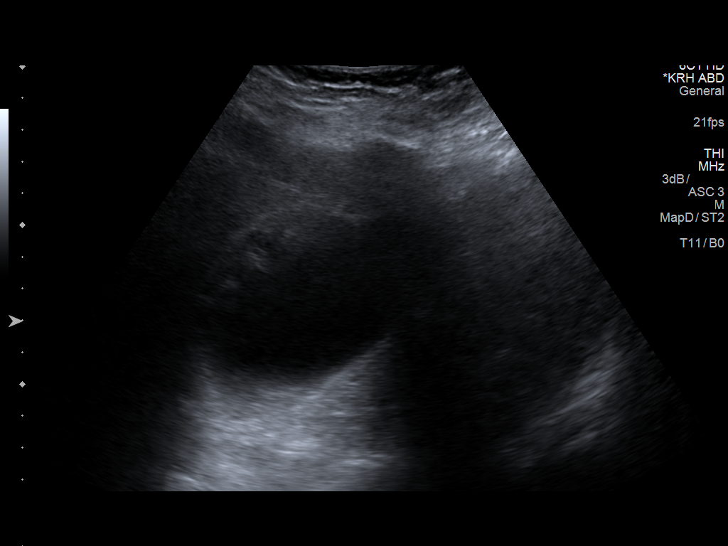
[im 38/61]
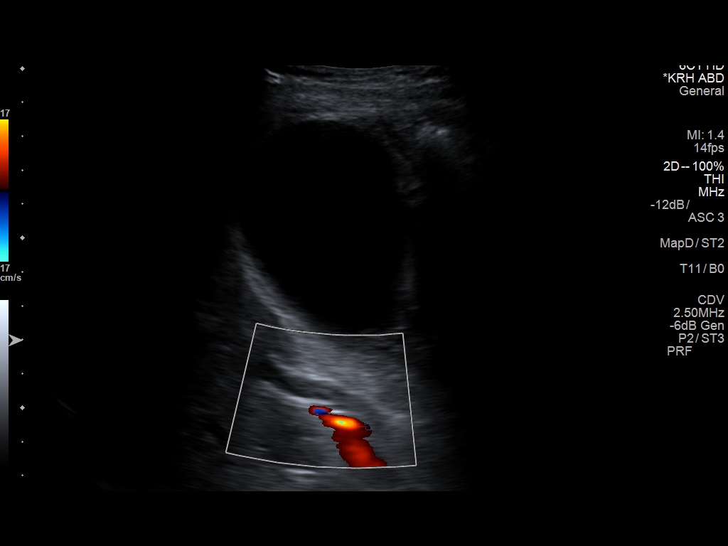
[im 41/61]
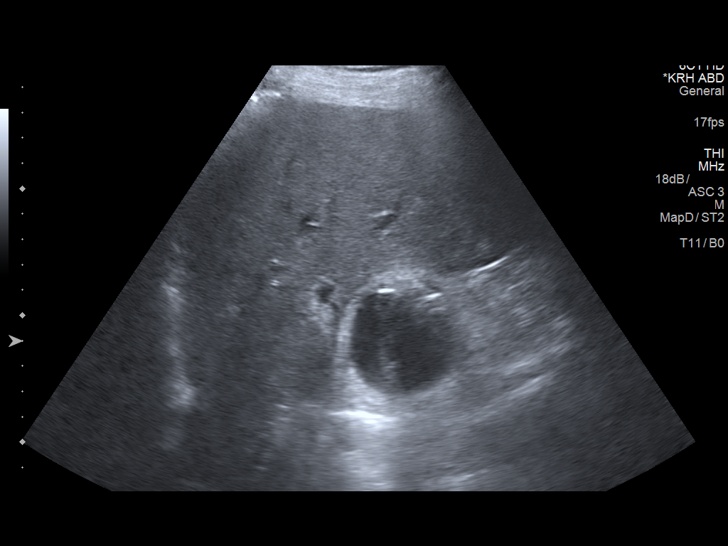
[im 46/61]
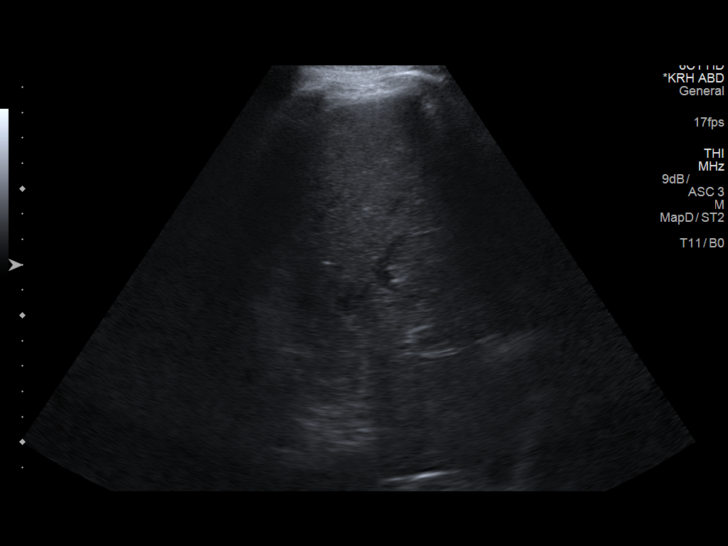
[im 51/61]
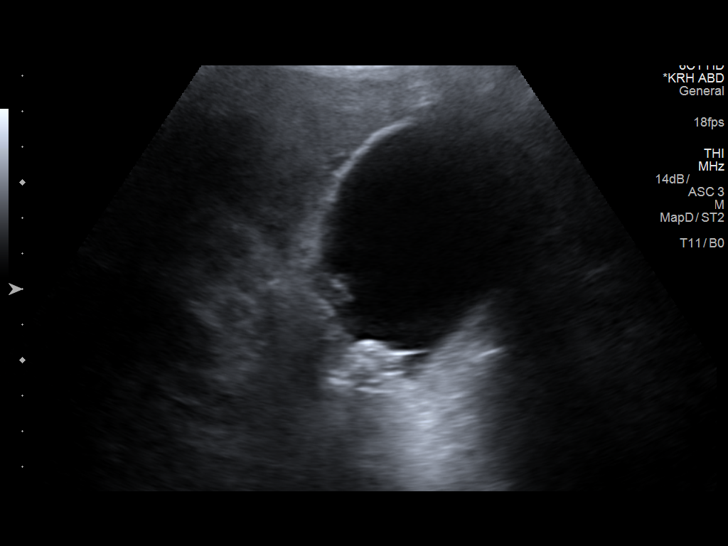
[im 56/61]
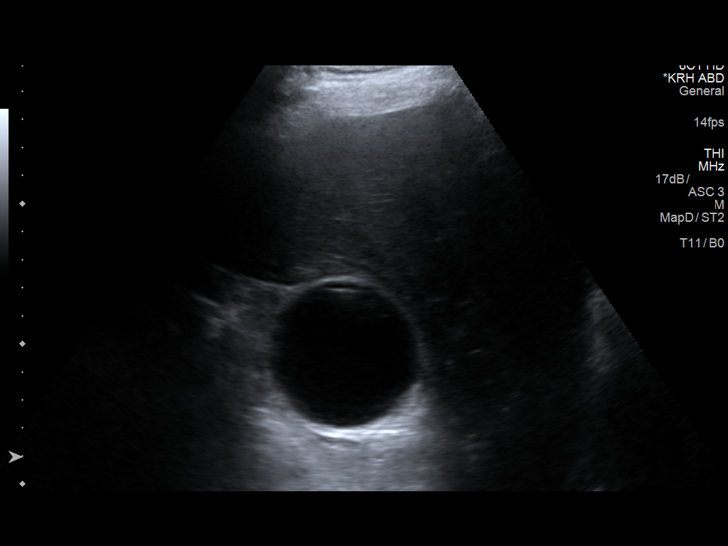
[im 61/61]
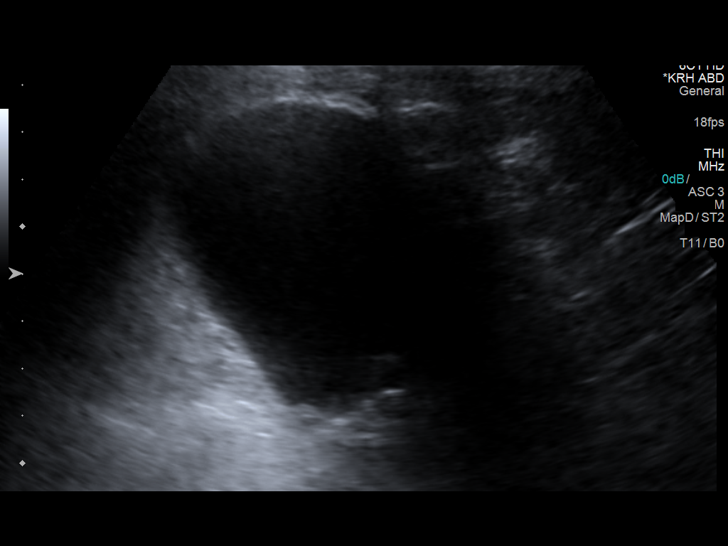

[14 of 25 positions shown; findings below may reference images not displayed]

FINDINGS: Gallbladder:

Cholelithiasis is noted with largest calculus measuring 2 cm. No
significant gallbladder wall thickening or pericholecystic fluid is
noted. No sonographic Murphy's sign is noted.

Common bile duct:

Diameter: 5.5 mm which is within normal limits.

Liver:

No focal lesion identified. Within normal limits in parenchymal
echogenicity. Portal vein is patent on color Doppler imaging with
normal direction of blood flow towards the liver.
IMPRESSION: Cholelithiasis is noted without definite evidence of cholecystitis.
If there is clinical concern for cholecystitis, HIDA scan is
recommended for further evaluation.

## 2018-11-11 DIAGNOSIS — Z950 Presence of cardiac pacemaker: Secondary | ICD-10-CM | POA: Diagnosis not present

## 2018-11-11 DIAGNOSIS — N183 Chronic kidney disease, stage 3 (moderate): Secondary | ICD-10-CM | POA: Diagnosis not present

## 2018-11-11 DIAGNOSIS — I2510 Atherosclerotic heart disease of native coronary artery without angina pectoris: Secondary | ICD-10-CM | POA: Diagnosis not present

## 2018-11-11 DIAGNOSIS — I4891 Unspecified atrial fibrillation: Secondary | ICD-10-CM | POA: Diagnosis not present

## 2018-11-11 DIAGNOSIS — Z0000 Encounter for general adult medical examination without abnormal findings: Secondary | ICD-10-CM | POA: Diagnosis not present

## 2018-11-11 DIAGNOSIS — I10 Essential (primary) hypertension: Secondary | ICD-10-CM | POA: Diagnosis not present

## 2018-11-11 DIAGNOSIS — R413 Other amnesia: Secondary | ICD-10-CM | POA: Diagnosis not present

## 2018-11-11 DIAGNOSIS — E7849 Other hyperlipidemia: Secondary | ICD-10-CM | POA: Diagnosis not present

## 2018-11-11 DIAGNOSIS — E038 Other specified hypothyroidism: Secondary | ICD-10-CM | POA: Diagnosis not present

## 2018-11-11 DIAGNOSIS — I499 Cardiac arrhythmia, unspecified: Secondary | ICD-10-CM | POA: Diagnosis not present

## 2018-11-11 DIAGNOSIS — I5022 Chronic systolic (congestive) heart failure: Secondary | ICD-10-CM | POA: Diagnosis not present

## 2018-11-11 DIAGNOSIS — Z6823 Body mass index (BMI) 23.0-23.9, adult: Secondary | ICD-10-CM | POA: Diagnosis not present

## 2018-11-12 LAB — CUP PACEART REMOTE DEVICE CHECK
Battery Remaining Longevity: 45 mo
Battery Voltage: 2.97 V
Brady Statistic AP VP Percent: 7.8 %
Brady Statistic AP VS Percent: 78.33 %
Brady Statistic AS VP Percent: 3.11 %
Brady Statistic AS VS Percent: 10.76 %
Brady Statistic RV Percent Paced: 10.91 %
Date Time Interrogation Session: 20191024073425
HighPow Impedance: 36 Ohm
HighPow Impedance: 51 Ohm
Implantable Lead Implant Date: 19990520
Implantable Lead Implant Date: 20011228
Implantable Lead Location: 753859
Implantable Lead Location: 753860
Lead Channel Impedance Value: 437 Ohm
Lead Channel Pacing Threshold Pulse Width: 0.4 ms
Lead Channel Sensing Intrinsic Amplitude: 0.875 mV
Lead Channel Sensing Intrinsic Amplitude: 0.875 mV
Lead Channel Sensing Intrinsic Amplitude: 11.25 mV
Lead Channel Setting Pacing Amplitude: 2 V
Lead Channel Setting Pacing Pulse Width: 0.4 ms
MDC IDC MSMT LEADCHNL RA IMPEDANCE VALUE: 342 Ohm
MDC IDC MSMT LEADCHNL RA PACING THRESHOLD AMPLITUDE: 0.75 V
MDC IDC MSMT LEADCHNL RV IMPEDANCE VALUE: 380 Ohm
MDC IDC MSMT LEADCHNL RV PACING THRESHOLD AMPLITUDE: 0.625 V
MDC IDC MSMT LEADCHNL RV PACING THRESHOLD PULSEWIDTH: 0.4 ms
MDC IDC MSMT LEADCHNL RV SENSING INTR AMPL: 11.25 mV
MDC IDC PG IMPLANT DT: 20140331
MDC IDC SET LEADCHNL RV PACING AMPLITUDE: 2.5 V
MDC IDC SET LEADCHNL RV SENSING SENSITIVITY: 0.3 mV
MDC IDC STAT BRADY RA PERCENT PACED: 83.07 %

## 2018-12-01 DIAGNOSIS — C4441 Basal cell carcinoma of skin of scalp and neck: Secondary | ICD-10-CM | POA: Diagnosis not present

## 2019-01-24 ENCOUNTER — Ambulatory Visit (INDEPENDENT_AMBULATORY_CARE_PROVIDER_SITE_OTHER): Payer: Medicare Other

## 2019-01-24 ENCOUNTER — Encounter: Payer: Self-pay | Admitting: Cardiology

## 2019-01-24 ENCOUNTER — Telehealth: Payer: Self-pay

## 2019-01-24 DIAGNOSIS — I255 Ischemic cardiomyopathy: Secondary | ICD-10-CM

## 2019-01-24 NOTE — Telephone Encounter (Signed)
Spoke with patient to remind of missed remote transmission 

## 2019-01-25 LAB — CUP PACEART REMOTE DEVICE CHECK
Battery Voltage: 2.96 V
Date Time Interrogation Session: 20200127181337
HIGH POWER IMPEDANCE MEASURED VALUE: 39 Ohm
HighPow Impedance: 57 Ohm
Implantable Lead Implant Date: 19990520
Implantable Lead Location: 753859
Implantable Lead Location: 753860
Implantable Pulse Generator Implant Date: 20140331
Lead Channel Impedance Value: 380 Ohm
Lead Channel Impedance Value: 437 Ohm
Lead Channel Pacing Threshold Amplitude: 0.75 V
Lead Channel Sensing Intrinsic Amplitude: 0.625 mV
Lead Channel Setting Pacing Pulse Width: 0.4 ms
Lead Channel Setting Sensing Sensitivity: 0.3 mV
MDC IDC LEAD IMPLANT DT: 20011228
MDC IDC MSMT BATTERY REMAINING LONGEVITY: 40 mo
MDC IDC MSMT LEADCHNL RA IMPEDANCE VALUE: 323 Ohm
MDC IDC MSMT LEADCHNL RA PACING THRESHOLD PULSEWIDTH: 0.4 ms
MDC IDC MSMT LEADCHNL RA SENSING INTR AMPL: 0.625 mV
MDC IDC MSMT LEADCHNL RV PACING THRESHOLD AMPLITUDE: 0.625 V
MDC IDC MSMT LEADCHNL RV PACING THRESHOLD PULSEWIDTH: 0.4 ms
MDC IDC MSMT LEADCHNL RV SENSING INTR AMPL: 10.875 mV
MDC IDC MSMT LEADCHNL RV SENSING INTR AMPL: 10.875 mV
MDC IDC SET LEADCHNL RA PACING AMPLITUDE: 2 V
MDC IDC SET LEADCHNL RV PACING AMPLITUDE: 2.5 V
MDC IDC STAT BRADY AP VP PERCENT: 8.89 %
MDC IDC STAT BRADY AP VS PERCENT: 80.45 %
MDC IDC STAT BRADY AS VP PERCENT: 3.1 %
MDC IDC STAT BRADY AS VS PERCENT: 7.56 %
MDC IDC STAT BRADY RA PERCENT PACED: 85.99 %
MDC IDC STAT BRADY RV PERCENT PACED: 11.78 %

## 2019-01-25 NOTE — Progress Notes (Signed)
Remote ICD transmission.   

## 2019-02-16 IMAGING — NM NM HEPATOBILIARY IMAGE, INC GB
2 series · 12 of 12 positions shown · non-contrast
Comparison: Right upper quadrant ultrasound 12/21/2017

ADDENDUM:
These results were called by telephone at the time of interpretation
on 12/23/2017 at [DATE] to Dr. Ronlor , who verbally acknowledged
these results.
CLINICAL DATA: Right-sided abdominal pain.  History of gallstones.

EXAM:
NUCLEAR MEDICINE HEPATOBILIARY IMAGING
TECHNIQUE: Sequential images of the abdomen were obtained [DATE] minutes
following intravenous administration of radiopharmaceutical.
RADIOPHARMACEUTICALS:  5.1 mCi Gc-HHm Choletec IV. 3.0 mg morphine
were given 90 at 90 minutes.

[he hepatobiliary · 3.10mm/px · 6 of 60 frames shown (1 of 2)]
[frame 6/60]
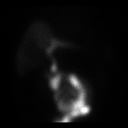
[frame 16/60]
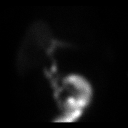
[frame 26/60]
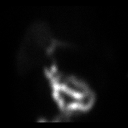
[frame 36/60]
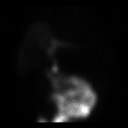
[frame 46/60]
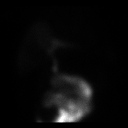
[frame 56/60]
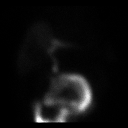

[he hepatobiliary · 3.10mm/px · 6 of 60 frames shown (2 of 2)]
[frame 6/60]
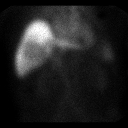
[frame 16/60]
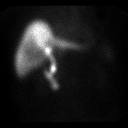
[frame 26/60]
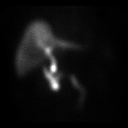
[frame 36/60]
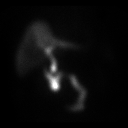
[frame 46/60]
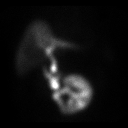
[frame 56/60]
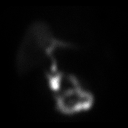

[12 of 12 positions shown; findings below may reference images not displayed]

FINDINGS: Prompt uptake and biliary excretion of activity by the liver is
seen. Large photopenic defect was seen overlying the right lobe of
the liver. Gallbladder activity is not visualized, consistent with
obstruction of the cystic duct. Biliary activity passes into small
bowel, consistent with patent common bile duct.

No significant change was seen after administration of morphine.
IMPRESSION: Nonvisualization of the gallbladder, consistent with obstruction at
the cystic duct level. Findings usually associated with acute
cholecystitis.

Patent common bile duct.

Large photopenic defect is seen overlying the right lobe of the
liver. This may represent an enlarged photopenic gallbladder
obscuring the right lobe of the liver, interposition of the
transverse colon obscuring the right lobe of the liver, or
potentially an intrinsic liver mass.

## 2019-03-11 ENCOUNTER — Other Ambulatory Visit: Payer: Self-pay | Admitting: Cardiovascular Disease

## 2019-03-14 ENCOUNTER — Other Ambulatory Visit: Payer: Self-pay

## 2019-03-14 MED ORDER — AMIODARONE HCL 100 MG PO TABS: 50.0000 mg | ORAL_TABLET | Freq: Every day | ORAL | 1 refills | Status: DC

## 2019-03-14 NOTE — Telephone Encounter (Signed)
Please review for refill on Amiodarone 100 mg take 1/2 tablet daily.

## 2019-03-21 ENCOUNTER — Telehealth: Payer: Self-pay

## 2019-03-21 NOTE — Telephone Encounter (Signed)
Spoke with pt advising that we are currently not seeing pt's in office due to COVID-19 pandemic.  Verified email and telephone number in system are correct.  Advised that our office will be in touch to schedule Webex appointment.

## 2019-03-25 ENCOUNTER — Ambulatory Visit: Payer: Medicare Other | Admitting: Neurology

## 2019-04-22 ENCOUNTER — Telehealth: Payer: Self-pay | Admitting: Internal Medicine

## 2019-04-22 NOTE — Telephone Encounter (Signed)
Spoke w/ pt wife and informed her that the remote transmissoin is automatic. If it doesn't come through automatically someone will call the next business day to request a manual transmission.

## 2019-04-22 NOTE — Telephone Encounter (Signed)
New message   Patient's wife wants to know if the home remote check will be done automatically or if patient has to send a transmission.

## 2019-04-25 ENCOUNTER — Other Ambulatory Visit: Payer: Self-pay

## 2019-04-25 ENCOUNTER — Ambulatory Visit (INDEPENDENT_AMBULATORY_CARE_PROVIDER_SITE_OTHER): Payer: Medicare Other | Admitting: *Deleted

## 2019-04-25 DIAGNOSIS — I472 Ventricular tachycardia, unspecified: Secondary | ICD-10-CM

## 2019-04-25 DIAGNOSIS — I5032 Chronic diastolic (congestive) heart failure: Secondary | ICD-10-CM

## 2019-04-25 LAB — CUP PACEART REMOTE DEVICE CHECK
Battery Remaining Longevity: 35 mo
Battery Voltage: 2.96 V
Brady Statistic AP VP Percent: 7.56 %
Brady Statistic AP VS Percent: 80.45 %
Brady Statistic AS VP Percent: 2.97 %
Brady Statistic AS VS Percent: 9.02 %
Brady Statistic RA Percent Paced: 84.78 %
Brady Statistic RV Percent Paced: 10.42 %
Date Time Interrogation Session: 20200427062706
HighPow Impedance: 37 Ohm
HighPow Impedance: 57 Ohm
Implantable Lead Implant Date: 19990520
Implantable Lead Implant Date: 20011228
Implantable Lead Location: 753859
Implantable Lead Location: 753860
Implantable Lead Model: 6940
Implantable Lead Model: 6947
Implantable Pulse Generator Implant Date: 20140331
Lead Channel Impedance Value: 323 Ohm
Lead Channel Impedance Value: 399 Ohm
Lead Channel Impedance Value: 456 Ohm
Lead Channel Pacing Threshold Amplitude: 0.75 V
Lead Channel Pacing Threshold Amplitude: 0.75 V
Lead Channel Pacing Threshold Pulse Width: 0.4 ms
Lead Channel Pacing Threshold Pulse Width: 0.4 ms
Lead Channel Sensing Intrinsic Amplitude: 0.75 mV
Lead Channel Sensing Intrinsic Amplitude: 0.75 mV
Lead Channel Sensing Intrinsic Amplitude: 8.375 mV
Lead Channel Sensing Intrinsic Amplitude: 8.375 mV
Lead Channel Setting Pacing Amplitude: 2 V
Lead Channel Setting Pacing Amplitude: 2.5 V
Lead Channel Setting Pacing Pulse Width: 0.4 ms
Lead Channel Setting Sensing Sensitivity: 0.3 mV

## 2019-05-04 NOTE — Progress Notes (Signed)
Remote ICD transmission.   

## 2019-05-05 ENCOUNTER — Telehealth: Payer: Self-pay

## 2019-05-05 NOTE — Telephone Encounter (Signed)
Spoke with pt and he has given verbal consent for phone call visit. Pt has been advised to have his BP ready for the visit.    YOUR CARDIOLOGY TEAM HAS ARRANGED FOR AN E-VISIT FOR YOUR APPOINTMENT - PLEASE REVIEW IMPORTANT INFORMATION BELOW SEVERAL DAYS PRIOR TO YOUR APPOINTMENT  Due to the recent COVID-19 pandemic, we are transitioning in-person office visits to tele-medicine visits in an effort to decrease unnecessary exposure to our patients and staff. Medicare and most insurances are covering these visits without a copay needed. You will need a working email and a smartphone or computer with a camera and microphone. For patients that do not have these items, we can still complete the visit using a telephone but do prefer video when possible. If possible, we also ask that you have a blood pressure cuff and scale at home to measure your blood pressure, heart rate and weight prior to your scheduled appointment. Patients with clinical needs that need an in-person evaluation and testing will still be able to come to the office if absolutely necessary. If you have any questions, feel free to call our office.     DOWNLOADING THE SOFTWARE  Download the [employer] app to enable video and telephone visits with your [facility] Provider.   Instructions for downloading [employer] WebEx: - Go to https://[email or web address].com/downloads.html and follow the instructions, or download the app on your smartphone [employer] Meetings). - If you have technical difficulties with downloading WebEx, please call WebEx at [phone]. - Once the app is downloaded (can be done on either mobile or desktop computer), go to Settings in the upper left hand corner.  Be sure that camera and audio are enabled.  - You will receive an email message with a link to the meeting with a time to join for your tele-health visit.  - Please download the app and have settings configured prior to the appointment time.      2-3 DAYS BEFORE  YOUR APPOINTMENT  One of our staff will call you to confirm that you have been able to set up your WebEx account. We will remind you check your blood pressure, heart rate and weight prior to your scheduled appointment. If you have an Apple Watch or Kardia, please upload any pertinent ECG strips the day before or morning of your appointment to [facility]. Our staff will also make sure you have reviewed the consent and agree to move forward with your scheduled tele-health visit.    THE DAY OF YOUR APPOINTMENT  Approximately 15-20 minutes prior to your scheduled appointment, you will receive an e-mail directly from one of our staff member's [email or web address] e-mail accounts inviting you to join a WebEx meeting.  Please do not reply to that email - simply join the [employer].  Upon joining, a member of the office staff will speak with you initially through the WebEx platform to confirm medications, vital signs for the day and any symptoms you may be experiencing.  Please have this information available prior to the time of visit start.      CONSENT FOR TELE-HEALTH VISIT - PLEASE RVIEW  I hereby voluntarily request, consent and authorize [facility] and its employed or contracted physicians, physician assistants, nurse practitioners or other licensed health care professionals (the Practitioner), to provide me with telemedicine health care services (the "Services") as deemed necessary by the treating Practitioner. I acknowledge and consent to receive the Services by the Practitioner via telemedicine. I understand that the telemedicine visit will involve communicating  with the Practitioner through live audiovisual communication technology and the disclosure of certain medical information by electronic transmission. I acknowledge that I have been given the opportunity to request an in-person assessment or other available alternative prior to the telemedicine visit and am voluntarily participating in the  telemedicine visit.  I understand that I have the right to withhold or withdraw my consent to the use of telemedicine in the course of my care at any time, without affecting my right to future care or treatment, and that the Practitioner or I may terminate the telemedicine visit at any time. I understand that I have the right to inspect all information obtained and/or recorded in the course of the telemedicine visit and may receive copies of available information for a reasonable fee.  I understand that some of the potential risks of receiving the Services via telemedicine include:  [name] Kitchen Delay or interruption in medical evaluation due to technological equipment failure or disruption; . Information transmitted may not be sufficient (e.g. poor resolution of images) to allow for appropriate medical decision making by the Practitioner; and/or  . In rare instances, security protocols could fail, causing a breach of personal health information.  Furthermore, I acknowledge that it is my responsibility to provide information about my medical history, conditions and care that is complete and accurate to the best of my ability. I acknowledge that Practitioner's advice, recommendations, and/or decision may be based on factors not within their control, such as incomplete or inaccurate data provided by me or distortions of diagnostic images or specimens that may result from electronic transmissions. I understand that the practice of medicine is not an exact science and that Practitioner makes no warranties or guarantees regarding treatment outcomes. I acknowledge that I will receive a copy of this consent concurrently upon execution via email to the email address I last provided but may also request a printed copy by calling the office of [facility].    I understand that my insurance will be billed for this visit.   I have read or had this consent read to me. . I understand the contents of this consent, which  adequately explains the benefits and risks of the Services being provided via telemedicine.  . I have been provided ample opportunity to ask questions regarding this consent and the Services and have had my questions answered to my satisfaction. . I give my informed consent for the services to be provided through the use of telemedicine in my medical care  By participating in this telemedicine visit I agree to the above.

## 2019-05-10 DIAGNOSIS — N183 Chronic kidney disease, stage 3 (moderate): Secondary | ICD-10-CM | POA: Diagnosis not present

## 2019-05-10 DIAGNOSIS — R05 Cough: Secondary | ICD-10-CM | POA: Diagnosis not present

## 2019-05-10 DIAGNOSIS — I130 Hypertensive heart and chronic kidney disease with heart failure and stage 1 through stage 4 chronic kidney disease, or unspecified chronic kidney disease: Secondary | ICD-10-CM | POA: Diagnosis not present

## 2019-05-10 DIAGNOSIS — I5022 Chronic systolic (congestive) heart failure: Secondary | ICD-10-CM | POA: Diagnosis not present

## 2019-05-10 DIAGNOSIS — Z20818 Contact with and (suspected) exposure to other bacterial communicable diseases: Secondary | ICD-10-CM | POA: Diagnosis not present

## 2019-05-10 DIAGNOSIS — J069 Acute upper respiratory infection, unspecified: Secondary | ICD-10-CM | POA: Diagnosis not present

## 2019-05-11 DIAGNOSIS — Z950 Presence of cardiac pacemaker: Secondary | ICD-10-CM | POA: Diagnosis not present

## 2019-05-11 DIAGNOSIS — I5022 Chronic systolic (congestive) heart failure: Secondary | ICD-10-CM | POA: Diagnosis not present

## 2019-05-11 DIAGNOSIS — E785 Hyperlipidemia, unspecified: Secondary | ICD-10-CM | POA: Diagnosis not present

## 2019-05-11 DIAGNOSIS — Z1339 Encounter for screening examination for other mental health and behavioral disorders: Secondary | ICD-10-CM | POA: Diagnosis not present

## 2019-05-11 DIAGNOSIS — I130 Hypertensive heart and chronic kidney disease with heart failure and stage 1 through stage 4 chronic kidney disease, or unspecified chronic kidney disease: Secondary | ICD-10-CM | POA: Diagnosis not present

## 2019-05-11 DIAGNOSIS — R413 Other amnesia: Secondary | ICD-10-CM | POA: Diagnosis not present

## 2019-05-11 DIAGNOSIS — K4090 Unilateral inguinal hernia, without obstruction or gangrene, not specified as recurrent: Secondary | ICD-10-CM | POA: Diagnosis not present

## 2019-05-11 DIAGNOSIS — I4891 Unspecified atrial fibrillation: Secondary | ICD-10-CM | POA: Diagnosis not present

## 2019-05-11 DIAGNOSIS — N401 Enlarged prostate with lower urinary tract symptoms: Secondary | ICD-10-CM | POA: Diagnosis not present

## 2019-05-11 DIAGNOSIS — Z1331 Encounter for screening for depression: Secondary | ICD-10-CM | POA: Diagnosis not present

## 2019-05-11 DIAGNOSIS — I499 Cardiac arrhythmia, unspecified: Secondary | ICD-10-CM | POA: Diagnosis not present

## 2019-05-11 DIAGNOSIS — N183 Chronic kidney disease, stage 3 (moderate): Secondary | ICD-10-CM | POA: Diagnosis not present

## 2019-05-11 DIAGNOSIS — I2510 Atherosclerotic heart disease of native coronary artery without angina pectoris: Secondary | ICD-10-CM | POA: Diagnosis not present

## 2019-05-11 DIAGNOSIS — E039 Hypothyroidism, unspecified: Secondary | ICD-10-CM | POA: Diagnosis not present

## 2019-05-12 DIAGNOSIS — I10 Essential (primary) hypertension: Secondary | ICD-10-CM | POA: Diagnosis not present

## 2019-05-16 ENCOUNTER — Telehealth (INDEPENDENT_AMBULATORY_CARE_PROVIDER_SITE_OTHER): Payer: Medicare Other | Admitting: Cardiovascular Disease

## 2019-05-16 ENCOUNTER — Encounter: Payer: Self-pay | Admitting: Cardiovascular Disease

## 2019-05-16 ENCOUNTER — Other Ambulatory Visit: Payer: Self-pay

## 2019-05-16 DIAGNOSIS — I2510 Atherosclerotic heart disease of native coronary artery without angina pectoris: Secondary | ICD-10-CM | POA: Diagnosis not present

## 2019-05-16 DIAGNOSIS — I472 Ventricular tachycardia, unspecified: Secondary | ICD-10-CM

## 2019-05-16 DIAGNOSIS — Z7189 Other specified counseling: Secondary | ICD-10-CM

## 2019-05-16 DIAGNOSIS — I5022 Chronic systolic (congestive) heart failure: Secondary | ICD-10-CM

## 2019-05-16 NOTE — Patient Instructions (Addendum)
Medication Instructions:  Your physician recommends that you continue on your current medications as directed. Please refer to the Current Medication list given to you today.  If you need a refill on your cardiac medications before your next appointment, please call your pharmacy.    Lab work: None Ordered    Testing/Procedures: None Ordered    Follow-Up: Your physician recommends that you return for a follow-up appointment on Wednesday September 2 at 2:20 pm with Dr. Acie Fredrickson
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
## 2019-06-03 ENCOUNTER — Other Ambulatory Visit: Payer: Self-pay | Admitting: Cardiovascular Disease

## 2019-06-03 MED ORDER — CARVEDILOL 6.25 MG PO TABS: ORAL_TABLET | ORAL | 3 refills | Status: DC

## 2019-06-03 NOTE — Telephone Encounter (Signed)
Pt's medication was sent to pt's pharmacy as requested. Confirmation received.  °

## 2019-06-27 DIAGNOSIS — H2513 Age-related nuclear cataract, bilateral: Secondary | ICD-10-CM | POA: Diagnosis not present

## 2019-06-27 DIAGNOSIS — H02052 Trichiasis without entropian right lower eyelid: Secondary | ICD-10-CM | POA: Diagnosis not present

## 2019-06-27 DIAGNOSIS — H0102B Squamous blepharitis left eye, upper and lower eyelids: Secondary | ICD-10-CM | POA: Diagnosis not present

## 2019-06-27 DIAGNOSIS — H02535 Eyelid retraction left lower eyelid: Secondary | ICD-10-CM | POA: Diagnosis not present

## 2019-06-27 DIAGNOSIS — H02532 Eyelid retraction right lower eyelid: Secondary | ICD-10-CM | POA: Diagnosis not present

## 2019-06-27 DIAGNOSIS — H0102A Squamous blepharitis right eye, upper and lower eyelids: Secondary | ICD-10-CM | POA: Diagnosis not present

## 2019-07-07 ENCOUNTER — Other Ambulatory Visit: Payer: Self-pay | Admitting: Cardiovascular Disease

## 2019-07-07 MED ORDER — SPIRONOLACTONE 25 MG PO TABS: 12.5000 mg | ORAL_TABLET | Freq: Every day | ORAL | 3 refills | Status: DC

## 2019-07-25 ENCOUNTER — Ambulatory Visit (INDEPENDENT_AMBULATORY_CARE_PROVIDER_SITE_OTHER): Payer: Medicare Other | Admitting: *Deleted

## 2019-07-25 DIAGNOSIS — I255 Ischemic cardiomyopathy: Secondary | ICD-10-CM | POA: Diagnosis not present

## 2019-07-25 LAB — CUP PACEART REMOTE DEVICE CHECK
Battery Remaining Longevity: 35 mo
Battery Voltage: 2.95 V
Brady Statistic AP VP Percent: 7.28 %
Brady Statistic AP VS Percent: 73.94 %
Brady Statistic AS VP Percent: 3.38 %
Brady Statistic AS VS Percent: 15.4 %
Brady Statistic RA Percent Paced: 77.72 %
Brady Statistic RV Percent Paced: 10.51 %
Date Time Interrogation Session: 20200727062704
HighPow Impedance: 36 Ohm
HighPow Impedance: 56 Ohm
Implantable Lead Implant Date: 19990520
Implantable Lead Implant Date: 20011228
Implantable Lead Location: 753859
Implantable Lead Location: 753860
Implantable Lead Model: 6940
Implantable Lead Model: 6947
Implantable Pulse Generator Implant Date: 20140331
Lead Channel Impedance Value: 342 Ohm
Lead Channel Impedance Value: 380 Ohm
Lead Channel Impedance Value: 456 Ohm
Lead Channel Pacing Threshold Amplitude: 0.625 V
Lead Channel Pacing Threshold Amplitude: 0.75 V
Lead Channel Pacing Threshold Pulse Width: 0.4 ms
Lead Channel Pacing Threshold Pulse Width: 0.4 ms
Lead Channel Sensing Intrinsic Amplitude: 0.75 mV
Lead Channel Sensing Intrinsic Amplitude: 0.75 mV
Lead Channel Sensing Intrinsic Amplitude: 10.625 mV
Lead Channel Sensing Intrinsic Amplitude: 10.625 mV
Lead Channel Setting Pacing Amplitude: 2 V
Lead Channel Setting Pacing Amplitude: 2.5 V
Lead Channel Setting Pacing Pulse Width: 0.4 ms
Lead Channel Setting Sensing Sensitivity: 0.3 mV

## 2019-08-12 ENCOUNTER — Encounter: Payer: Self-pay | Admitting: Cardiology

## 2019-08-12 NOTE — Progress Notes (Signed)
Remote ICD transmission.   

## 2019-08-31 ENCOUNTER — Ambulatory Visit: Payer: Medicare Other | Admitting: Cardiovascular Disease

## 2019-09-17 ENCOUNTER — Other Ambulatory Visit: Payer: Self-pay | Admitting: Cardiovascular Disease

## 2019-09-28 ENCOUNTER — Telehealth: Payer: Medicare Other | Admitting: Cardiovascular Disease

## 2019-09-28 DIAGNOSIS — J90 Pleural effusion, not elsewhere classified: Secondary | ICD-10-CM | POA: Diagnosis not present

## 2019-09-28 DIAGNOSIS — N183 Chronic kidney disease, stage 3 (moderate): Secondary | ICD-10-CM | POA: Diagnosis not present

## 2019-09-28 DIAGNOSIS — I130 Hypertensive heart and chronic kidney disease with heart failure and stage 1 through stage 4 chronic kidney disease, or unspecified chronic kidney disease: Secondary | ICD-10-CM | POA: Diagnosis not present

## 2019-09-28 DIAGNOSIS — I509 Heart failure, unspecified: Secondary | ICD-10-CM | POA: Diagnosis not present

## 2019-09-28 DIAGNOSIS — I4891 Unspecified atrial fibrillation: Secondary | ICD-10-CM | POA: Diagnosis not present

## 2019-09-28 DIAGNOSIS — R05 Cough: Secondary | ICD-10-CM | POA: Diagnosis not present

## 2019-10-04 ENCOUNTER — Other Ambulatory Visit: Payer: Self-pay

## 2019-10-04 ENCOUNTER — Telehealth: Payer: Self-pay | Admitting: Cardiovascular Disease

## 2019-10-04 ENCOUNTER — Encounter: Payer: Self-pay | Admitting: Cardiovascular Disease

## 2019-10-04 ENCOUNTER — Ambulatory Visit (INDEPENDENT_AMBULATORY_CARE_PROVIDER_SITE_OTHER): Payer: Medicare Other | Admitting: Cardiovascular Disease

## 2019-10-04 VITALS — BP 126/84 | HR 67 | Ht 70.5 in | Wt 156.1 lb

## 2019-10-04 DIAGNOSIS — I5022 Chronic systolic (congestive) heart failure: Secondary | ICD-10-CM | POA: Diagnosis not present

## 2019-10-04 DIAGNOSIS — I255 Ischemic cardiomyopathy: Secondary | ICD-10-CM

## 2019-10-04 DIAGNOSIS — E782 Mixed hyperlipidemia: Secondary | ICD-10-CM | POA: Diagnosis not present

## 2019-10-04 DIAGNOSIS — I2510 Atherosclerotic heart disease of native coronary artery without angina pectoris: Secondary | ICD-10-CM

## 2019-10-04 DIAGNOSIS — I480 Paroxysmal atrial fibrillation: Secondary | ICD-10-CM | POA: Diagnosis not present

## 2019-10-04 DIAGNOSIS — I4901 Ventricular fibrillation: Secondary | ICD-10-CM

## 2019-10-04 NOTE — Telephone Encounter (Signed)
Spoke with patient's wife who states she took him to Dr. Reynaldo Minium last week for evaluation of breathing problems. She was upset that we did not allow her to attend the visit with patient today. I advised the reason that visitors are limited and asked her in the future to call ahead of the appointment to discuss her need to come with him. She states he was placed on furosemide for a few days. I advised her that Dr. Acie Fredrickson listened to his lungs and heart today and did not hear any signs of fluid overload and did not see any swelling. He discussed reducing sodium intake with the patient as well. I reviewed this information with his wife and advised her to call back if patient has weight gain >3 lb in a day or 5 lb in a week, SOB, swelling, or other concerns. I advised her to follow Dr. Jacquiline Doe advice regarding taking the remainder of the furosemide and to call back in the future with questions or concerns. She verbalized understanding and thanked me for my time.
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
## 2019-10-04 NOTE — Telephone Encounter (Signed)
New Message  Patient's wife is calling in with questions for the doctor or the nurse about the patient being on fluid pills for the fluid on the lungs. Please give patient's wife a call back to discuss.

## 2019-10-04 NOTE — Patient Instructions (Signed)
Medication Instructions:  Your physician recommends that you continue on your current medications as directed. Please refer to the Current Medication list given to you today.  If you need a refill on your cardiac medications before your next appointment, please call your pharmacy.   Lab work: TODAY - cholesterol, liver panel, basic metabolic panel  If you have labs (blood work) drawn today and your tests are completely normal, you will receive your results only by: . MyChart Message (if you have MyChart) OR . A paper copy in the mail If you have any lab test that is abnormal or we need to change your treatment, we will call you to review the results.  Testing/Procedures: None Ordered   Follow-Up: At [facility], you and your health needs are our priority.  As part of our continuing mission to provide you with exceptional heart care, we have created designated Provider Care Teams.  These Care Teams include your primary Cardiologist (physician) and Advanced Practice Providers (APPs -  Physician Assistants and Nurse Practitioners) who all work together to provide you with the care you need, when you need it. You will need a follow up appointment in:  1 years.  Please call our office 2 months in advance to schedule this appointment.  You may see Philip Nahser, MD or one of the following Advanced Practice Providers on your designated Care Team: Scott Weaver, PA-C Vin Bhagat, PA-C . Janine Hammond, NP     

## 2019-10-05 LAB — BASIC METABOLIC PANEL
BUN/Creatinine Ratio: 16 (ref 10–24)
BUN: 20 mg/dL (ref 8–27)
CO2: 25 mmol/L (ref 20–29)
Calcium: 9.2 mg/dL (ref 8.6–10.2)
Chloride: 103 mmol/L (ref 96–106)
Creatinine, Ser: 1.24 mg/dL (ref 0.76–1.27)
GFR calc Af Amer: 62 mL/min/{1.73_m2} (ref >59)
GFR calc non Af Amer: 54 mL/min/{1.73_m2} — ABNORMAL LOW (ref >59)
Glucose: 88 mg/dL (ref 65–99)
Potassium: 4.2 mmol/L (ref 3.5–5.2)
Sodium: 142 mmol/L (ref 134–144)

## 2019-10-05 LAB — HEPATIC FUNCTION PANEL
ALT: 12 IU/L (ref 0–44)
AST: 22 IU/L (ref 0–40)
Albumin: 4 g/dL (ref 3.6–4.6)
Alkaline Phosphatase: 33 IU/L — ABNORMAL LOW (ref 39–117)
Bilirubin Total: 1 mg/dL (ref 0.0–1.2)
Bilirubin, Direct: 0.47 mg/dL — ABNORMAL HIGH (ref 0.00–0.40)
Total Protein: 6.7 g/dL (ref 6.0–8.5)

## 2019-10-05 LAB — LIPID PANEL
Chol/HDL Ratio: 4.5 ratio (ref 0.0–5.0)
Cholesterol, Total: 121 mg/dL (ref 100–199)
HDL: 27 mg/dL — ABNORMAL LOW (ref >39)
LDL Chol Calc (NIH): 80 mg/dL (ref 0–99)
Triglycerides: 69 mg/dL (ref 0–149)
VLDL Cholesterol Cal: 14 mg/dL (ref 5–40)

## 2019-10-08 ENCOUNTER — Other Ambulatory Visit: Payer: Self-pay | Admitting: Cardiovascular Disease

## 2019-10-11 ENCOUNTER — Ambulatory Visit: Payer: Medicare Other | Admitting: Cardiovascular Disease

## 2019-10-12 DIAGNOSIS — R05 Cough: Secondary | ICD-10-CM | POA: Diagnosis not present

## 2019-10-12 DIAGNOSIS — N1831 Chronic kidney disease, stage 3a: Secondary | ICD-10-CM | POA: Diagnosis not present

## 2019-10-12 DIAGNOSIS — I130 Hypertensive heart and chronic kidney disease with heart failure and stage 1 through stage 4 chronic kidney disease, or unspecified chronic kidney disease: Secondary | ICD-10-CM | POA: Diagnosis not present

## 2019-10-12 DIAGNOSIS — I509 Heart failure, unspecified: Secondary | ICD-10-CM | POA: Diagnosis not present

## 2019-10-12 DIAGNOSIS — I4891 Unspecified atrial fibrillation: Secondary | ICD-10-CM | POA: Diagnosis not present

## 2019-10-24 ENCOUNTER — Other Ambulatory Visit: Payer: Self-pay

## 2019-10-24 ENCOUNTER — Ambulatory Visit: Payer: Medicare Other | Admitting: Neurology

## 2019-10-25 ENCOUNTER — Ambulatory Visit: Payer: Medicare Other | Admitting: *Deleted

## 2019-10-26 LAB — CUP PACEART REMOTE DEVICE CHECK
Battery Remaining Longevity: 27 mo
Battery Voltage: 2.94 V
Brady Statistic AP VP Percent: 6.14 %
Brady Statistic AP VS Percent: 71.47 %
Brady Statistic AS VP Percent: 3.29 %
Brady Statistic AS VS Percent: 19.1 %
Brady Statistic RA Percent Paced: 74.45 %
Brady Statistic RV Percent Paced: 9.24 %
Date Time Interrogation Session: 20201028130533
HighPow Impedance: 36 Ohm
HighPow Impedance: 53 Ohm
Implantable Lead Implant Date: 19990520
Implantable Lead Implant Date: 20011228
Implantable Lead Location: 753859
Implantable Lead Location: 753860
Implantable Lead Model: 6940
Implantable Lead Model: 6947
Implantable Pulse Generator Implant Date: 20140331
Lead Channel Impedance Value: 323 Ohm
Lead Channel Impedance Value: 342 Ohm
Lead Channel Impedance Value: 437 Ohm
Lead Channel Pacing Threshold Amplitude: 0.625 V
Lead Channel Pacing Threshold Amplitude: 0.75 V
Lead Channel Pacing Threshold Pulse Width: 0.4 ms
Lead Channel Pacing Threshold Pulse Width: 0.4 ms
Lead Channel Sensing Intrinsic Amplitude: 0.625 mV
Lead Channel Sensing Intrinsic Amplitude: 0.625 mV
Lead Channel Sensing Intrinsic Amplitude: 11.625 mV
Lead Channel Sensing Intrinsic Amplitude: 11.625 mV
Lead Channel Setting Pacing Amplitude: 2 V
Lead Channel Setting Pacing Amplitude: 2.5 V
Lead Channel Setting Pacing Pulse Width: 0.4 ms
Lead Channel Setting Sensing Sensitivity: 0.3 mV

## 2019-11-01 ENCOUNTER — Ambulatory Visit: Payer: Medicare Other | Admitting: Neurology

## 2019-11-11 DIAGNOSIS — Z125 Encounter for screening for malignant neoplasm of prostate: Secondary | ICD-10-CM | POA: Diagnosis not present

## 2019-11-11 DIAGNOSIS — E7849 Other hyperlipidemia: Secondary | ICD-10-CM | POA: Diagnosis not present

## 2019-11-11 DIAGNOSIS — E038 Other specified hypothyroidism: Secondary | ICD-10-CM | POA: Diagnosis not present

## 2019-11-14 DIAGNOSIS — I130 Hypertensive heart and chronic kidney disease with heart failure and stage 1 through stage 4 chronic kidney disease, or unspecified chronic kidney disease: Secondary | ICD-10-CM | POA: Diagnosis not present

## 2019-11-14 DIAGNOSIS — E785 Hyperlipidemia, unspecified: Secondary | ICD-10-CM | POA: Diagnosis not present

## 2019-11-14 DIAGNOSIS — I4891 Unspecified atrial fibrillation: Secondary | ICD-10-CM | POA: Diagnosis not present

## 2019-11-14 DIAGNOSIS — I499 Cardiac arrhythmia, unspecified: Secondary | ICD-10-CM | POA: Diagnosis not present

## 2019-11-14 DIAGNOSIS — I5022 Chronic systolic (congestive) heart failure: Secondary | ICD-10-CM | POA: Diagnosis not present

## 2019-11-14 DIAGNOSIS — I2510 Atherosclerotic heart disease of native coronary artery without angina pectoris: Secondary | ICD-10-CM | POA: Diagnosis not present

## 2019-11-14 DIAGNOSIS — Z0000 Encounter for general adult medical examination without abnormal findings: Secondary | ICD-10-CM | POA: Diagnosis not present

## 2019-11-14 DIAGNOSIS — Z950 Presence of cardiac pacemaker: Secondary | ICD-10-CM | POA: Diagnosis not present

## 2019-11-14 DIAGNOSIS — N183 Chronic kidney disease, stage 3 unspecified: Secondary | ICD-10-CM | POA: Diagnosis not present

## 2019-11-14 DIAGNOSIS — Z23 Encounter for immunization: Secondary | ICD-10-CM | POA: Diagnosis not present

## 2019-11-14 DIAGNOSIS — R413 Other amnesia: Secondary | ICD-10-CM | POA: Diagnosis not present

## 2019-11-14 DIAGNOSIS — E039 Hypothyroidism, unspecified: Secondary | ICD-10-CM | POA: Diagnosis not present

## 2020-01-14 ENCOUNTER — Ambulatory Visit: Payer: Medicare Other | Attending: Internal Medicine

## 2020-01-14 DIAGNOSIS — Z23 Encounter for immunization: Secondary | ICD-10-CM

## 2020-01-14 NOTE — Progress Notes (Signed)
   Covid-19 Vaccination Clinic  Name:  [name]    MRN: [id number] DOB: Mar 14, 1935  01/14/2020  [name] was observed post Covid-19 immunization for 15 minutes without incidence. He was provided with Vaccine Information Sheet and instruction to access the V-Safe system.   [name] was instructed to call 911 with any severe reactions post vaccine: [name] Kitchen Difficulty breathing  . Swelling of your face and throat  . A fast heartbeat  . A bad rash all over your body  . Dizziness and weakness    Immunizations Administered    Name Date Dose VIS Date Route   Pfizer COVID-19 Vaccine 01/14/2020  1:41 PM 0.3 mL 12/09/2019 Intramuscular   Manufacturer: [employer], [employer]   Lot: [id number]   [place]: [address]-3

## 2020-01-16 DIAGNOSIS — R05 Cough: Secondary | ICD-10-CM | POA: Diagnosis not present

## 2020-01-16 DIAGNOSIS — Z1152 Encounter for screening for COVID-19: Secondary | ICD-10-CM | POA: Diagnosis not present

## 2020-01-16 DIAGNOSIS — I5022 Chronic systolic (congestive) heart failure: Secondary | ICD-10-CM | POA: Diagnosis not present

## 2020-01-16 DIAGNOSIS — J181 Lobar pneumonia, unspecified organism: Secondary | ICD-10-CM | POA: Diagnosis not present

## 2020-01-16 DIAGNOSIS — Z20818 Contact with and (suspected) exposure to other bacterial communicable diseases: Secondary | ICD-10-CM | POA: Diagnosis not present

## 2020-01-24 ENCOUNTER — Ambulatory Visit (INDEPENDENT_AMBULATORY_CARE_PROVIDER_SITE_OTHER): Payer: Medicare Other | Admitting: *Deleted

## 2020-01-24 DIAGNOSIS — I255 Ischemic cardiomyopathy: Secondary | ICD-10-CM | POA: Diagnosis not present

## 2020-01-25 LAB — CUP PACEART REMOTE DEVICE CHECK
Battery Remaining Longevity: 25 mo
Battery Voltage: 2.94 V
Brady Statistic AP VP Percent: 5.64 %
Brady Statistic AP VS Percent: 77.78 %
Brady Statistic AS VP Percent: 2.51 %
Brady Statistic AS VS Percent: 14.07 %
Brady Statistic RA Percent Paced: 80.66 %
Brady Statistic RV Percent Paced: 8.05 %
Date Time Interrogation Session: 20210126012304
HighPow Impedance: 40 Ohm
HighPow Impedance: 60 Ohm
Implantable Lead Implant Date: 19990520
Implantable Lead Implant Date: 20011228
Implantable Lead Location: 753859
Implantable Lead Location: 753860
Implantable Lead Model: 6940
Implantable Lead Model: 6947
Implantable Pulse Generator Implant Date: 20140331
Lead Channel Impedance Value: 323 Ohm
Lead Channel Impedance Value: 380 Ohm
Lead Channel Impedance Value: 456 Ohm
Lead Channel Pacing Threshold Amplitude: 0.625 V
Lead Channel Pacing Threshold Amplitude: 0.75 V
Lead Channel Pacing Threshold Pulse Width: 0.4 ms
Lead Channel Pacing Threshold Pulse Width: 0.4 ms
Lead Channel Sensing Intrinsic Amplitude: 0.75 mV
Lead Channel Sensing Intrinsic Amplitude: 0.75 mV
Lead Channel Sensing Intrinsic Amplitude: 11 mV
Lead Channel Sensing Intrinsic Amplitude: 11 mV
Lead Channel Setting Pacing Amplitude: 2 V
Lead Channel Setting Pacing Amplitude: 2.5 V
Lead Channel Setting Pacing Pulse Width: 0.4 ms
Lead Channel Setting Sensing Sensitivity: 0.3 mV

## 2020-02-01 ENCOUNTER — Ambulatory Visit: Payer: Medicare Other

## 2020-02-01 ENCOUNTER — Ambulatory Visit: Payer: Medicare Other | Attending: Internal Medicine

## 2020-02-01 DIAGNOSIS — Z23 Encounter for immunization: Secondary | ICD-10-CM | POA: Insufficient documentation

## 2020-02-01 NOTE — Progress Notes (Signed)
   Covid-19 Vaccination Clinic  Name:  [name]    MRN: [id number] DOB: 03/14/1935  02/01/2020  [name] was observed post Covid-19 immunization for 15 minutes without incidence. He was provided with Vaccine Information Sheet and instruction to access the V-Safe system.   [name] was instructed to call 911 with any severe reactions post vaccine: [name] Kitchen Difficulty breathing  . Swelling of your face and throat  . A fast heartbeat  . A bad rash all over your body  . Dizziness and weakness    Immunizations Administered    Name Date Dose VIS Date Route   Pfizer COVID-19 Vaccine 02/01/2020  8:35 AM 0.3 mL 12/09/2019 Intramuscular   Manufacturer: [place]   Lot: [id number]   [place]: [id number]

## 2020-02-13 DIAGNOSIS — H2513 Age-related nuclear cataract, bilateral: Secondary | ICD-10-CM | POA: Diagnosis not present

## 2020-02-13 DIAGNOSIS — H0102A Squamous blepharitis right eye, upper and lower eyelids: Secondary | ICD-10-CM | POA: Diagnosis not present

## 2020-02-13 DIAGNOSIS — H0102B Squamous blepharitis left eye, upper and lower eyelids: Secondary | ICD-10-CM | POA: Diagnosis not present

## 2020-02-13 DIAGNOSIS — H02052 Trichiasis without entropian right lower eyelid: Secondary | ICD-10-CM | POA: Diagnosis not present

## 2020-04-12 ENCOUNTER — Other Ambulatory Visit: Payer: Self-pay | Admitting: Cardiovascular Disease

## 2020-04-24 ENCOUNTER — Ambulatory Visit (INDEPENDENT_AMBULATORY_CARE_PROVIDER_SITE_OTHER): Payer: Medicare Other | Admitting: *Deleted

## 2020-04-24 DIAGNOSIS — I255 Ischemic cardiomyopathy: Secondary | ICD-10-CM

## 2020-04-25 ENCOUNTER — Telehealth: Payer: Self-pay

## 2020-04-25 LAB — CUP PACEART REMOTE DEVICE CHECK
Battery Remaining Longevity: 26 mo
Battery Voltage: 2.92 V
Brady Statistic AP VP Percent: 6.19 %
Brady Statistic AP VS Percent: 79.01 %
Brady Statistic AS VP Percent: 2.73 %
Brady Statistic AS VS Percent: 12.07 %
Brady Statistic RA Percent Paced: 82.17 %
Brady Statistic RV Percent Paced: 8.83 %
Date Time Interrogation Session: 20210428135818
HighPow Impedance: 38 Ohm
HighPow Impedance: 56 Ohm
Implantable Lead Implant Date: 19990520
Implantable Lead Implant Date: 20011228
Implantable Lead Location: 753859
Implantable Lead Location: 753860
Implantable Lead Model: 6940
Implantable Lead Model: 6947
Implantable Pulse Generator Implant Date: 20140331
Lead Channel Impedance Value: 323 Ohm
Lead Channel Impedance Value: 342 Ohm
Lead Channel Impedance Value: 437 Ohm
Lead Channel Pacing Threshold Amplitude: 0.75 V
Lead Channel Pacing Threshold Amplitude: 0.75 V
Lead Channel Pacing Threshold Pulse Width: 0.4 ms
Lead Channel Pacing Threshold Pulse Width: 0.4 ms
Lead Channel Sensing Intrinsic Amplitude: 0.75 mV
Lead Channel Sensing Intrinsic Amplitude: 0.75 mV
Lead Channel Sensing Intrinsic Amplitude: 10.125 mV
Lead Channel Sensing Intrinsic Amplitude: 10.125 mV
Lead Channel Setting Pacing Amplitude: 2 V
Lead Channel Setting Pacing Amplitude: 2.5 V
Lead Channel Setting Pacing Pulse Width: 0.4 ms
Lead Channel Setting Sensing Sensitivity: 0.3 mV

## 2020-04-25 NOTE — Progress Notes (Signed)
ICD Remote  

## 2020-04-25 NOTE — Telephone Encounter (Signed)
Spoke with patient to remind of missed remote transmission 

## 2020-04-26 DIAGNOSIS — R413 Other amnesia: Secondary | ICD-10-CM | POA: Diagnosis not present

## 2020-04-26 DIAGNOSIS — E039 Hypothyroidism, unspecified: Secondary | ICD-10-CM | POA: Diagnosis not present

## 2020-04-26 DIAGNOSIS — I2510 Atherosclerotic heart disease of native coronary artery without angina pectoris: Secondary | ICD-10-CM | POA: Diagnosis not present

## 2020-04-26 DIAGNOSIS — N390 Urinary tract infection, site not specified: Secondary | ICD-10-CM | POA: Diagnosis not present

## 2020-04-26 DIAGNOSIS — E785 Hyperlipidemia, unspecified: Secondary | ICD-10-CM | POA: Diagnosis not present

## 2020-04-26 DIAGNOSIS — N183 Chronic kidney disease, stage 3 unspecified: Secondary | ICD-10-CM | POA: Diagnosis not present

## 2020-04-26 DIAGNOSIS — I499 Cardiac arrhythmia, unspecified: Secondary | ICD-10-CM | POA: Diagnosis not present

## 2020-04-26 DIAGNOSIS — I130 Hypertensive heart and chronic kidney disease with heart failure and stage 1 through stage 4 chronic kidney disease, or unspecified chronic kidney disease: Secondary | ICD-10-CM | POA: Diagnosis not present

## 2020-05-01 DIAGNOSIS — R413 Other amnesia: Secondary | ICD-10-CM | POA: Diagnosis not present

## 2020-05-08 DIAGNOSIS — E785 Hyperlipidemia, unspecified: Secondary | ICD-10-CM | POA: Diagnosis not present

## 2020-05-11 DIAGNOSIS — I10 Essential (primary) hypertension: Secondary | ICD-10-CM | POA: Diagnosis not present

## 2020-05-11 DIAGNOSIS — F439 Reaction to severe stress, unspecified: Secondary | ICD-10-CM | POA: Diagnosis not present

## 2020-05-14 ENCOUNTER — Other Ambulatory Visit: Payer: Self-pay | Admitting: Cardiovascular Disease

## 2020-05-15 DIAGNOSIS — D510 Vitamin B12 deficiency anemia due to intrinsic factor deficiency: Secondary | ICD-10-CM | POA: Diagnosis not present

## 2020-05-23 DIAGNOSIS — I2510 Atherosclerotic heart disease of native coronary artery without angina pectoris: Secondary | ICD-10-CM | POA: Diagnosis not present

## 2020-05-23 DIAGNOSIS — D510 Vitamin B12 deficiency anemia due to intrinsic factor deficiency: Secondary | ICD-10-CM | POA: Diagnosis not present

## 2020-05-23 DIAGNOSIS — I5022 Chronic systolic (congestive) heart failure: Secondary | ICD-10-CM | POA: Diagnosis not present

## 2020-05-23 DIAGNOSIS — I130 Hypertensive heart and chronic kidney disease with heart failure and stage 1 through stage 4 chronic kidney disease, or unspecified chronic kidney disease: Secondary | ICD-10-CM | POA: Diagnosis not present

## 2020-05-23 DIAGNOSIS — M1990 Unspecified osteoarthritis, unspecified site: Secondary | ICD-10-CM | POA: Diagnosis not present

## 2020-05-23 DIAGNOSIS — N183 Chronic kidney disease, stage 3 unspecified: Secondary | ICD-10-CM | POA: Diagnosis not present

## 2020-05-23 DIAGNOSIS — Z1331 Encounter for screening for depression: Secondary | ICD-10-CM | POA: Diagnosis not present

## 2020-05-23 DIAGNOSIS — I10 Essential (primary) hypertension: Secondary | ICD-10-CM | POA: Diagnosis not present

## 2020-05-23 DIAGNOSIS — E785 Hyperlipidemia, unspecified: Secondary | ICD-10-CM | POA: Diagnosis not present

## 2020-05-23 DIAGNOSIS — R413 Other amnesia: Secondary | ICD-10-CM | POA: Diagnosis not present

## 2020-05-23 DIAGNOSIS — I4891 Unspecified atrial fibrillation: Secondary | ICD-10-CM | POA: Diagnosis not present

## 2020-06-07 ENCOUNTER — Other Ambulatory Visit: Payer: Self-pay | Admitting: Cardiovascular Disease

## 2020-07-24 ENCOUNTER — Ambulatory Visit (INDEPENDENT_AMBULATORY_CARE_PROVIDER_SITE_OTHER): Payer: Medicare Other | Admitting: *Deleted

## 2020-07-24 DIAGNOSIS — I472 Ventricular tachycardia, unspecified: Secondary | ICD-10-CM

## 2020-07-24 DIAGNOSIS — D510 Vitamin B12 deficiency anemia due to intrinsic factor deficiency: Secondary | ICD-10-CM | POA: Diagnosis not present

## 2020-07-25 LAB — CUP PACEART REMOTE DEVICE CHECK
Battery Remaining Longevity: 24 mo
Battery Voltage: 2.92 V
Brady Statistic AP VP Percent: 6.31 %
Brady Statistic AP VS Percent: 80.69 %
Brady Statistic AS VP Percent: 2.84 %
Brady Statistic AS VS Percent: 10.16 %
Brady Statistic RA Percent Paced: 84.13 %
Brady Statistic RV Percent Paced: 9.12 %
Date Time Interrogation Session: 20210727001607
HighPow Impedance: 39 Ohm
HighPow Impedance: 59 Ohm
Implantable Lead Implant Date: 19990520
Implantable Lead Implant Date: 20011228
Implantable Lead Location: 753859
Implantable Lead Location: 753860
Implantable Lead Model: 6940
Implantable Lead Model: 6947
Implantable Pulse Generator Implant Date: 20140331
Lead Channel Impedance Value: 342 Ohm
Lead Channel Impedance Value: 380 Ohm
Lead Channel Impedance Value: 456 Ohm
Lead Channel Pacing Threshold Amplitude: 0.75 V
Lead Channel Pacing Threshold Amplitude: 0.75 V
Lead Channel Pacing Threshold Pulse Width: 0.4 ms
Lead Channel Pacing Threshold Pulse Width: 0.4 ms
Lead Channel Sensing Intrinsic Amplitude: 0.625 mV
Lead Channel Sensing Intrinsic Amplitude: 0.625 mV
Lead Channel Sensing Intrinsic Amplitude: 10.875 mV
Lead Channel Sensing Intrinsic Amplitude: 10.875 mV
Lead Channel Setting Pacing Amplitude: 2 V
Lead Channel Setting Pacing Amplitude: 2.5 V
Lead Channel Setting Pacing Pulse Width: 0.4 ms
Lead Channel Setting Sensing Sensitivity: 0.3 mV

## 2020-07-30 NOTE — Progress Notes (Signed)
Remote ICD transmission.   

## 2020-08-06 ENCOUNTER — Other Ambulatory Visit: Payer: Self-pay | Admitting: Cardiovascular Disease

## 2020-08-17 ENCOUNTER — Encounter: Payer: Self-pay | Admitting: Neurology

## 2020-08-17 ENCOUNTER — Other Ambulatory Visit: Payer: Self-pay

## 2020-08-17 ENCOUNTER — Ambulatory Visit (INDEPENDENT_AMBULATORY_CARE_PROVIDER_SITE_OTHER): Payer: Medicare Other | Admitting: Neurology

## 2020-08-17 VITALS — BP 118/72 | HR 53 | Ht 70.0 in | Wt 149.0 lb

## 2020-08-17 DIAGNOSIS — F0390 Unspecified dementia without behavioral disturbance: Secondary | ICD-10-CM | POA: Diagnosis not present

## 2020-08-17 DIAGNOSIS — F03A0 Unspecified dementia, mild, without behavioral disturbance, psychotic disturbance, mood disturbance, and anxiety: Secondary | ICD-10-CM

## 2020-08-17 DIAGNOSIS — I255 Ischemic cardiomyopathy: Secondary | ICD-10-CM

## 2020-08-17 MED ORDER — MEMANTINE HCL 10 MG PO TABS: ORAL_TABLET | ORAL | 11 refills | Status: DC

## 2020-08-17 NOTE — Progress Notes (Signed)
NEUROLOGY FOLLOW UP OFFICE NOTE  [name] [id number] 12/18/1935  HISTORY OF PRESENT ILLNESS: I had the pleasure of seeing [name] in follow-up in the neurology clinic on 08/17/2020.  The patient was last seen 2 year ago for memory loss. He is again accompanied by his daughter who helps supplement the history today.  Records and images were personally reviewed where available.  MMSE 25/30 in 2019. He is on Donepezil 5mg  daily without side effects. He states his memory is not good. His daughter notes short-term memory issues, he repeats himself. His wife manages finances. His wife fixes his pillbox and he takes it by himself without issues. He only drives to the church, no concerns with short distances. He is independent with dressing and bathing. No issues operating remote control or microwave. His daughter has noticed he is more irritable, he has not noticed it and feels his mood is overall good. His daughter notes his mood is better when he walks. He walks 3 miles daily. He denies any headaches, dizziness, focal numbness/tingling/weakness, no falls. Sleep is good. No paranoia or hallucinations.    Head CT in 07/2018 showed mild to moderate diffuse volume loss, chronic small left inferior occipital infarct.   Lab Results  Component Value Date   TSH 1.99 08/03/2018   Lab Results  Component Value Date   [id number] 160 08/03/2018    History on Initial Assessment 08/03/2018: This is an 85 year old right-handed man with a history of hypertension, hyperlipidemia, CAD, atrial fibrillation s/p PPM, subdural hematoma s/p craniotomy in 2006, presenting for evaluation of worsening memory. He denies any issues but states "I have some things I forget." His wife started noticing memory changes around the end of December 2018, he was not feeling well and had gall bladder surgery. He got confused during his hospital stay and has improved since then but took a while. His daughter reports he repeats  himself. He continues to drive without getting lost. Family denies any driving concerns. His wife fills his pillbox and he takes his medications by himself. His wife manages finances. He is independent with dressing and bathing. No paranoia or hallucinations, there is some irritability. No family history of dementia. No alcohol use. He has been taking Aricept 5mg  daily since January 2019 with no side effects.   His wife reports his main problem currently is keeping him hydrated. They can tell a difference in cognition when he is dehydrated. He has lost weight and appetite and struggles to get his weight back up. He denies any headaches, dizziness, vision changes, dysarthria/dysphagia, neck/back pain, focal numbness/tingling/weakness, bowel/bladder dysfunction, anosmia, or tremors. Sleep is good, no wandering behavior. He had a significant headache in 10/2005 and was found to have large bilateral subdural hematomas, underwent bilateral craniotomy. He was on Coumadin at that time which was discontinued.    PAST MEDICAL HISTORY: Past Medical History:  Diagnosis Date  . Atrial arrhythmia/a fibrillation    not on coumadin 2/2 subdural hematoma  . Automatic implantable cardiac defibrillator in situ    medtronic  . Automatic implantable cardioverter-defibrillator in situ   . CHF (congestive heart failure) ([place])   . Chronic systolic congestive heart failure ([place])   . Dysrhythmia   . Encounter for long-term (current) use of other medications    amiodarone  . H/O hiatal hernia   . Hyperlipidemia   . Hypertension   . Hypothyroidism   . Ischemic cardiomyopathy    status post CABG with patent vein grafts  and an atretic LIMA to his ramus, January 2012  . Myocardial infarction [facility])    1995, non-STEMI January 2012  . Pacemaker   . Subdural hematoma ([facility])    on coumadin  . Thyroid disease    hypothyroid  . Ventricular fibrillation ([place])   . Ventricular tachycardia ([place])     VT [place]-January 2012-  responded to Amiodarone    MEDICATIONS: Current Outpatient Medications on File Prior to Visit  Medication Sig Dispense Refill  . amiodarone (PACERONE) 100 MG tablet TAKE 1/2 TABLET BY MOUTH DAILY 45 tablet 1  . aspirin EC 81 MG tablet Take 81 mg by mouth daily.    [name] Kitchen b complex vitamins tablet Take 1 tablet by mouth daily.    . carvedilol (COREG) 6.25 MG tablet TAKE 1 TABLET BY MOUTH 2 TIMES DAILY WITH A MEAL. Please keep upcoming appt with Dr. Acie Fredrickson in October for future refills. Thank you 180 tablet 0  . Coenzyme Q10 (COQ10 PO) Take 1 capsule by mouth daily.    [name] Kitchen donepezil (ARICEPT) 5 MG tablet Take 5 mg by mouth at bedtime.     [name] Kitchen escitalopram (LEXAPRO) 5 MG tablet Take 5 mg by mouth daily.  6  . fenofibrate 160 MG tablet TAKE 1 TABLET BY MOUTH EVERY DAY 90 tablet 2  . levothyroxine (SYNTHROID, LEVOTHROID) 75 MCG tablet Take 1 tablet (75 mcg total) by mouth daily before breakfast.  3  . lisinopril (ZESTRIL) 5 MG tablet TAKE 1 TABLET BY MOUTH EVERY DAY 90 tablet 2  . Multiple Vitamin (MULTIVITAMIN WITH MINERALS) TABS tablet Take 1 tablet by mouth daily.    . nitroGLYCERIN (NITROSTAT) 0.4 MG SL tablet TAKE SUBLINGUALLY AS NEEDED    . spironolactone (ALDACTONE) 25 MG tablet Take 0.5 tablets (12.5 mg total) by mouth daily. Please keep upcoming appt with Dr. Acie Fredrickson in October for future refills. Thank you 45 tablet 0   No current facility-administered medications on file prior to visit.    ALLERGIES: No Known Allergies  FAMILY HISTORY: Family History  Problem Relation Age of Onset  . Cerebral aneurysm Father   . Hypertension Father     SOCIAL HISTORY: Social History   Socioeconomic History  . Marital status: Married    Spouse name: Not on file  . Number of children: Not on file  . Years of education: Not on file  . Highest education level: Not on file  Occupational History  . Not on file  Tobacco Use  . Smoking status: Never Smoker  . Smokeless tobacco: Former Radiographer, therapeutic  . Vaping Use: Never used  Substance and Sexual Activity  . Alcohol use: No  . Drug use: No  . Sexual activity: Not on file  Other Topics Concern  . Not on file  Social History Narrative   Pt lives in 2 story home with his wife   Has 2 adult children   High school education   Retired Tour manager    Right Handed   Drinks coffee in the morning      Social Determinants of Health   Financial Resource Strain:   . Difficulty of Paying Living Expenses: Not on file  Food Insecurity:   . Worried About Charity fundraiser in the Last Year: Not on file  . Ran Out of Food in the Last Year: Not on file  Transportation Needs:   . Lack of Transportation (Medical): Not on file  . Lack of Transportation (Non-Medical): Not on file  Physical Activity:   . Days of Exercise per Week: Not on file  . Minutes of Exercise per Session: Not on file  Stress:   . Feeling of Stress : Not on file  Social Connections:   . Frequency of Communication with Friends and Family: Not on file  . Frequency of Social Gatherings with Friends and Family: Not on file  . Attends Religious Services: Not on file  . Active Member of Clubs or Organizations: Not on file  . Attends Archivist Meetings: Not on file  . Marital Status: Not on file  Intimate Partner Violence:   . Fear of Current or Ex-Partner: Not on file  . Emotionally Abused: Not on file  . Physically Abused: Not on file  . Sexually Abused: Not on file    PHYSICAL EXAM: Vitals:   08/17/20 1535  BP: 118/72  Pulse: (!) 53  SpO2: 97%   General: No acute distress Head:  Normocephalic/atraumatic Skin/Extremities: No rash, no edema Neurological Exam: alert and oriented to person, place, Did not know year. No aphasia or dysarthria. Fund of knowledge is appropriate.  Recent and remote memory are impaired.  Attention and concentration are reduced. SLUMS score 11/30  [employer] Mental Exam 08/17/2020  Weekday Correct 1  Current  year 0  What state are we in? 1  Amount spent 1  Amount left 0  # of Animals 1  5 objects recall 0  Number series 1  Hour markers 2  Time correct 0  Placed X in triangle correctly 1  Largest Figure 1  Name of male 2  Date back to work 0  Type of work 0  State she lived in 0  Total score 11   Cranial nerves: Pupils equal, round. Extraocular movements intact with no nystagmus. Visual fields full. No facial asymmetry. Motor: Bulk and tone normal, muscle strength 5/5 throughout with no pronator drift. Finger to nose testing intact.  Gait slightly wide-based, he has a fallen arch on the right foot walking with foot turned in, hunched posture. No ataxia   IMPRESSION: This is an 85 yo RH man with a history of  hypertension, hyperlipidemia, CAD, atrial fibrillation s/p PPM, subdural hematoma s/p craniotomy in 2006, with memory loss. SLUMS score today 11/30. Family continues to note short-term memory changes, but appears to overall manage complex tasks fine. Neurocognitive testing will be ordered to further evaluate cognitive concerns. Head CT showed diffuse volume loss, chronic left occipital infarct. We discussed adding Memantine to Donepezil, would avoid increasing dose of Donepezil due to bradycardia. Continue Donepezil 5mg  daily. Side effects of Memantine discussed, start memantine 10mg  qhs x 2 weeks, then increase to 10mg  BID. Continue close supervision, continue to monitor driving. We discussed the importance of control of vascular risk factors, physical exercise, and brain stimulation exercises for brain health. Follow-up in 6 months, they know to call for any changes.    Thank you for allowing me to participate in his care.  Please do not hesitate to call for any questions or concerns.   Ellouise Newer, M.D.   CC: Dr. Reynaldo Minium

## 2020-08-17 NOTE — Patient Instructions (Addendum)
1. Start Memantine 10mg : take 1 tablet every night for 2 weeks, then increase to 1 tablet twice a day  2. Continue Donepezil 5mg  daily  3. Schedule Neurocognitive testing  4. If interested, there are some activities which have therapeutic value and can be useful in keeping you cognitively stimulated. You can try this website: https://www.[facility].org/get-to-know-barrow/centers-programs/neurorehabilitation-center/neuro-rehab-apps-and-games/ which has options, categorized by level of difficulty.  5. Continue to monitor driving.  6. Follow-up in 6 months, call for any changes   FALL PRECAUTIONS: Be cautious when walking. Scan the area for obstacles that may increase the risk of trips and falls. When getting up in the mornings, sit up at the edge of the bed for a few minutes before getting out of bed. Consider elevating the bed at the head end to avoid drop of blood pressure when getting up. Walk always in a well-lit room (use night lights in the walls). Avoid area rugs or power cords from appliances in the middle of the walkways. Use a walker or a cane if necessary and consider physical therapy for balance exercise. Get your eyesight checked regularly.  FINANCIAL OVERSIGHT: Supervision, especially oversight when making financial decisions or transactions is also recommended.  HOME SAFETY: Consider the safety of the kitchen when operating appliances like stoves, microwave oven, and blender. Consider having supervision and share cooking responsibilities until no longer able to participate in those. Accidents with firearms and other hazards in the house should be identified and addressed as well.  DRIVING: Regarding driving, in patients with progressive memory problems, driving will be impaired. We advise to have someone else do the driving if trouble finding directions or if minor accidents are reported. Independent driving assessment is available to determine safety of driving.  ABILITY TO BE  LEFT ALONE: If patient is unable to contact 911 operator, consider using LifeLine, or when the need is there, arrange for someone to stay with patients. Smoking is a fire hazard, consider supervision or cessation. Risk of wandering should be assessed by caregiver and if detected at any point, supervision and safe proof recommendations should be instituted.  MEDICATION SUPERVISION: Inability to self-administer medication needs to be constantly addressed. Implement a mechanism to ensure safe administration of the medications.  RECOMMENDATIONS FOR ALL PATIENTS WITH MEMORY PROBLEMS: 1. Continue to exercise (Recommend 30 minutes of walking everyday, or 3 hours every week) 2. Increase social interactions - continue going to [place] and enjoy social gatherings with friends and family 3. Eat healthy, avoid fried foods and eat more fruits and vegetables 4. Maintain adequate blood pressure, blood sugar, and blood cholesterol level. Reducing the risk of stroke and cardiovascular disease also helps promoting better memory. 5. Avoid stressful situations. Live a simple life and avoid aggravations. Organize your time and prepare for the next day in anticipation. 6. Sleep well, avoid any interruptions of sleep and avoid any distractions in the bedroom that may interfere with adequate sleep quality 7. Avoid sugar, avoid sweets as there is a strong link between excessive sugar intake, diabetes, and cognitive impairment We discussed the Mediterranean diet, which has been shown to help patients reduce the risk of progressive memory disorders and reduces cardiovascular risk. This includes eating fish, eat fruits and green leafy vegetables, nuts like almonds and hazelnuts, walnuts, and also use olive oil. Avoid fast foods and fried foods as much as possible. Avoid sweets and sugar as sugar use has been linked to worsening of memory function.  There is always a concern of gradual progression of memory  problems. If this is the  case, then we may need to adjust level of care according to patient needs. Support, both to the patient and caregiver, should then be put into place.

## 2020-08-23 DIAGNOSIS — D510 Vitamin B12 deficiency anemia due to intrinsic factor deficiency: Secondary | ICD-10-CM | POA: Diagnosis not present

## 2020-09-08 ENCOUNTER — Other Ambulatory Visit: Payer: Self-pay | Admitting: Neurology

## 2020-09-08 ENCOUNTER — Other Ambulatory Visit: Payer: Self-pay | Admitting: Cardiovascular Disease

## 2020-09-11 DIAGNOSIS — H2513 Age-related nuclear cataract, bilateral: Secondary | ICD-10-CM | POA: Diagnosis not present

## 2020-09-11 DIAGNOSIS — H02052 Trichiasis without entropian right lower eyelid: Secondary | ICD-10-CM | POA: Diagnosis not present

## 2020-09-11 DIAGNOSIS — H0102A Squamous blepharitis right eye, upper and lower eyelids: Secondary | ICD-10-CM | POA: Diagnosis not present

## 2020-09-11 DIAGNOSIS — H0102B Squamous blepharitis left eye, upper and lower eyelids: Secondary | ICD-10-CM | POA: Diagnosis not present

## 2020-09-20 ENCOUNTER — Encounter: Payer: Medicare Other | Admitting: Counselor

## 2020-09-25 DIAGNOSIS — D510 Vitamin B12 deficiency anemia due to intrinsic factor deficiency: Secondary | ICD-10-CM | POA: Diagnosis not present

## 2020-09-27 ENCOUNTER — Encounter: Payer: Medicare Other | Admitting: Counselor

## 2020-10-04 ENCOUNTER — Encounter: Payer: Self-pay | Admitting: Cardiovascular Disease
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
## 2020-10-05 ENCOUNTER — Ambulatory Visit (INDEPENDENT_AMBULATORY_CARE_PROVIDER_SITE_OTHER): Payer: Medicare Other | Admitting: Cardiovascular Disease

## 2020-10-05 ENCOUNTER — Other Ambulatory Visit: Payer: Self-pay

## 2020-10-05 ENCOUNTER — Encounter: Payer: Self-pay | Admitting: Cardiovascular Disease

## 2020-10-05 VITALS — BP 114/70 | HR 69 | Ht 70.0 in | Wt 154.0 lb

## 2020-10-05 DIAGNOSIS — I472 Ventricular tachycardia, unspecified: Secondary | ICD-10-CM

## 2020-10-05 DIAGNOSIS — I5022 Chronic systolic (congestive) heart failure: Secondary | ICD-10-CM

## 2020-10-05 DIAGNOSIS — I255 Ischemic cardiomyopathy: Secondary | ICD-10-CM

## 2020-10-05 DIAGNOSIS — E782 Mixed hyperlipidemia: Secondary | ICD-10-CM | POA: Diagnosis not present

## 2020-10-05 DIAGNOSIS — I5032 Chronic diastolic (congestive) heart failure: Secondary | ICD-10-CM

## 2020-10-05 DIAGNOSIS — I2510 Atherosclerotic heart disease of native coronary artery without angina pectoris: Secondary | ICD-10-CM | POA: Diagnosis not present

## 2020-10-05 DIAGNOSIS — Z79899 Other long term (current) drug therapy: Secondary | ICD-10-CM

## 2020-10-05 LAB — HEPATIC FUNCTION PANEL
ALT: 11 IU/L (ref 0–44)
AST: 16 IU/L (ref 0–40)
Albumin: 3.6 g/dL (ref 3.6–4.6)
Alkaline Phosphatase: 31 IU/L — ABNORMAL LOW (ref 44–121)
Bilirubin Total: 1.2 mg/dL (ref 0.0–1.2)
Bilirubin, Direct: 0.71 mg/dL — ABNORMAL HIGH (ref 0.00–0.40)
Total Protein: 6.3 g/dL (ref 6.0–8.5)

## 2020-10-05 LAB — BASIC METABOLIC PANEL
BUN/Creatinine Ratio: 11 (ref 10–24)
BUN: 12 mg/dL (ref 8–27)
CO2: 23 mmol/L (ref 20–29)
Calcium: 8.8 mg/dL (ref 8.6–10.2)
Chloride: 104 mmol/L (ref 96–106)
Creatinine, Ser: 1.12 mg/dL (ref 0.76–1.27)
GFR calc Af Amer: 70 mL/min/{1.73_m2} (ref >59)
GFR calc non Af Amer: 60 mL/min/{1.73_m2} (ref >59)
Glucose: 80 mg/dL (ref 65–99)
Potassium: 4.1 mmol/L (ref 3.5–5.2)
Sodium: 140 mmol/L (ref 134–144)

## 2020-10-05 LAB — LIPID PANEL
Chol/HDL Ratio: 4.6 ratio (ref 0.0–5.0)
Cholesterol, Total: 106 mg/dL (ref 100–199)
HDL: 23 mg/dL — ABNORMAL LOW (ref >39)
LDL Chol Calc (NIH): 69 mg/dL (ref 0–99)
Triglycerides: 61 mg/dL (ref 0–149)
VLDL Cholesterol Cal: 14 mg/dL (ref 5–40)

## 2020-10-05 LAB — TSH: TSH: 4.22 u[IU]/mL (ref 0.450–4.500)

## 2020-10-05 NOTE — Patient Instructions (Addendum)
Medication Instructions:  Your physician recommends that you continue on your current medications as directed. Please refer to the Current Medication list given to you today.  *If you need a refill on your cardiac medications before your next appointment, please call your pharmacy*  Lab Work: Your physician recommends that you have lab work today- lipids, liver, BMET and TSH  If you have labs (blood work) drawn today and your tests are completely normal, you will receive your results only by: [name] Kitchen MyChart Message (if you have MyChart) OR . A paper copy in the mail If you have any lab test that is abnormal or we need to change your treatment, we will call you to review the results.  Testing/Procedures: None ordered today.  Follow-Up: At [facility], you and your health needs are our priority.  As part of our continuing mission to provide you with exceptional heart care, we have created designated Provider Care Teams.  These Care Teams include your primary Cardiologist (physician) and Advanced Practice Providers (APPs -  Physician Assistants and Nurse Practitioners) who all work together to provide you with the care you need, when you need it.  We recommend signing up for the patient portal called "MyChart".  Sign up information is provided on this After Visit Summary.  MyChart is used to connect with patients for Virtual Visits (Telemedicine).  Patients are able to view lab/test results, encounter notes, upcoming appointments, etc.  Non-urgent messages can be sent to your provider as well.   To learn more about what you can do with MyChart, go to [email or web address].    Your next appointment:   6 month(s)  The format for your next appointment:   In Person  Provider:   You may see Mertie Moores, MD or one of the following Advanced Practice Providers on your designated Care Team:    Richardson Dopp, PA-C  [place], [place]

## 2020-10-09 DIAGNOSIS — Z23 Encounter for immunization: Secondary | ICD-10-CM | POA: Diagnosis not present

## 2020-10-23 ENCOUNTER — Ambulatory Visit (INDEPENDENT_AMBULATORY_CARE_PROVIDER_SITE_OTHER): Payer: Medicare Other

## 2020-10-23 DIAGNOSIS — I472 Ventricular tachycardia, unspecified: Secondary | ICD-10-CM

## 2020-10-23 LAB — CUP PACEART REMOTE DEVICE CHECK
Battery Remaining Longevity: 19 mo
Battery Voltage: 2.9 V
Brady Statistic AP VP Percent: 6.79 %
Brady Statistic AP VS Percent: 76.91 %
Brady Statistic AS VP Percent: 3.02 %
Brady Statistic AS VS Percent: 13.28 %
Brady Statistic RA Percent Paced: 80.75 %
Brady Statistic RV Percent Paced: 9.71 %
Date Time Interrogation Session: 20211026022826
HighPow Impedance: 41 Ohm
HighPow Impedance: 60 Ohm
Implantable Lead Implant Date: 19990520
Implantable Lead Implant Date: 20011228
Implantable Lead Location: 753859
Implantable Lead Location: 753860
Implantable Lead Model: 6940
Implantable Lead Model: 6947
Implantable Pulse Generator Implant Date: 20140331
Lead Channel Impedance Value: 342 Ohm
Lead Channel Impedance Value: 380 Ohm
Lead Channel Impedance Value: 456 Ohm
Lead Channel Pacing Threshold Amplitude: 0.625 V
Lead Channel Pacing Threshold Amplitude: 0.75 V
Lead Channel Pacing Threshold Pulse Width: 0.4 ms
Lead Channel Pacing Threshold Pulse Width: 0.4 ms
Lead Channel Sensing Intrinsic Amplitude: 0.75 mV
Lead Channel Sensing Intrinsic Amplitude: 0.75 mV
Lead Channel Sensing Intrinsic Amplitude: 14.375 mV
Lead Channel Sensing Intrinsic Amplitude: 14.375 mV
Lead Channel Setting Pacing Amplitude: 2 V
Lead Channel Setting Pacing Amplitude: 2.5 V
Lead Channel Setting Pacing Pulse Width: 0.4 ms
Lead Channel Setting Sensing Sensitivity: 0.3 mV

## 2020-10-24 DIAGNOSIS — D510 Vitamin B12 deficiency anemia due to intrinsic factor deficiency: Secondary | ICD-10-CM | POA: Diagnosis not present

## 2020-10-27 ENCOUNTER — Other Ambulatory Visit: Payer: Self-pay | Admitting: Cardiovascular Disease

## 2020-10-29 NOTE — Progress Notes (Signed)
Remote ICD transmission.   

## 2020-11-01 DIAGNOSIS — H2513 Age-related nuclear cataract, bilateral: Secondary | ICD-10-CM | POA: Diagnosis not present

## 2020-11-01 DIAGNOSIS — H02052 Trichiasis without entropian right lower eyelid: Secondary | ICD-10-CM | POA: Diagnosis not present

## 2020-11-07 ENCOUNTER — Other Ambulatory Visit: Payer: Self-pay | Admitting: Cardiovascular Disease

## 2020-11-12 DIAGNOSIS — E039 Hypothyroidism, unspecified: Secondary | ICD-10-CM | POA: Diagnosis not present

## 2020-11-12 DIAGNOSIS — E785 Hyperlipidemia, unspecified: Secondary | ICD-10-CM | POA: Diagnosis not present

## 2020-11-12 DIAGNOSIS — Z0000 Encounter for general adult medical examination without abnormal findings: Secondary | ICD-10-CM | POA: Diagnosis not present

## 2020-11-12 DIAGNOSIS — I10 Essential (primary) hypertension: Secondary | ICD-10-CM | POA: Diagnosis not present

## 2020-11-12 DIAGNOSIS — D510 Vitamin B12 deficiency anemia due to intrinsic factor deficiency: Secondary | ICD-10-CM | POA: Diagnosis not present

## 2020-11-12 DIAGNOSIS — Z125 Encounter for screening for malignant neoplasm of prostate: Secondary | ICD-10-CM | POA: Diagnosis not present

## 2020-11-21 DIAGNOSIS — I130 Hypertensive heart and chronic kidney disease with heart failure and stage 1 through stage 4 chronic kidney disease, or unspecified chronic kidney disease: Secondary | ICD-10-CM | POA: Diagnosis not present

## 2020-11-21 DIAGNOSIS — R413 Other amnesia: Secondary | ICD-10-CM | POA: Diagnosis not present

## 2020-11-21 DIAGNOSIS — Z0000 Encounter for general adult medical examination without abnormal findings: Secondary | ICD-10-CM | POA: Diagnosis not present

## 2020-11-21 DIAGNOSIS — E785 Hyperlipidemia, unspecified: Secondary | ICD-10-CM | POA: Diagnosis not present

## 2020-11-21 DIAGNOSIS — N183 Chronic kidney disease, stage 3 unspecified: Secondary | ICD-10-CM | POA: Diagnosis not present

## 2020-11-21 DIAGNOSIS — N401 Enlarged prostate with lower urinary tract symptoms: Secondary | ICD-10-CM | POA: Diagnosis not present

## 2020-11-21 DIAGNOSIS — D510 Vitamin B12 deficiency anemia due to intrinsic factor deficiency: Secondary | ICD-10-CM | POA: Diagnosis not present

## 2020-11-21 DIAGNOSIS — E039 Hypothyroidism, unspecified: Secondary | ICD-10-CM | POA: Diagnosis not present

## 2020-12-07 ENCOUNTER — Other Ambulatory Visit: Payer: Self-pay | Admitting: Cardiovascular Disease

## 2021-01-08 DIAGNOSIS — D510 Vitamin B12 deficiency anemia due to intrinsic factor deficiency: Secondary | ICD-10-CM | POA: Diagnosis not present

## 2021-01-22 ENCOUNTER — Ambulatory Visit (INDEPENDENT_AMBULATORY_CARE_PROVIDER_SITE_OTHER): Payer: Medicare Other

## 2021-01-22 DIAGNOSIS — I472 Ventricular tachycardia, unspecified: Secondary | ICD-10-CM

## 2021-01-22 LAB — CUP PACEART REMOTE DEVICE CHECK
Battery Remaining Longevity: 17 mo
Battery Voltage: 2.89 V
Brady Statistic AP VP Percent: 6.85 %
Brady Statistic AP VS Percent: 80.29 %
Brady Statistic AS VP Percent: 3.11 %
Brady Statistic AS VS Percent: 9.74 %
Brady Statistic RA Percent Paced: 83.91 %
Brady Statistic RV Percent Paced: 9.9 %
Date Time Interrogation Session: 20220125001607
HighPow Impedance: 39 Ohm
HighPow Impedance: 62 Ohm
Implantable Lead Implant Date: 19990520
Implantable Lead Implant Date: 20011228
Implantable Lead Location: 753859
Implantable Lead Location: 753860
Implantable Lead Model: 6940
Implantable Lead Model: 6947
Implantable Pulse Generator Implant Date: 20140331
Lead Channel Impedance Value: 323 Ohm
Lead Channel Impedance Value: 380 Ohm
Lead Channel Impedance Value: 456 Ohm
Lead Channel Pacing Threshold Amplitude: 0.75 V
Lead Channel Pacing Threshold Amplitude: 0.75 V
Lead Channel Pacing Threshold Pulse Width: 0.4 ms
Lead Channel Pacing Threshold Pulse Width: 0.4 ms
Lead Channel Sensing Intrinsic Amplitude: 0.75 mV
Lead Channel Sensing Intrinsic Amplitude: 0.75 mV
Lead Channel Sensing Intrinsic Amplitude: 11.625 mV
Lead Channel Sensing Intrinsic Amplitude: 11.625 mV
Lead Channel Setting Pacing Amplitude: 2 V
Lead Channel Setting Pacing Amplitude: 2.5 V
Lead Channel Setting Pacing Pulse Width: 0.4 ms
Lead Channel Setting Sensing Sensitivity: 0.3 mV

## 2021-02-02 NOTE — Progress Notes (Signed)
Remote ICD transmission.   

## 2021-02-11 ENCOUNTER — Other Ambulatory Visit: Payer: Self-pay

## 2021-02-11 MED ORDER — LISINOPRIL 5 MG PO TABS
5.0000 mg | ORAL_TABLET | Freq: Every day | ORAL | 2 refills | Status: DC
Start: 2021-02-11 — End: 2021-06-24

## 2021-03-19 DIAGNOSIS — D510 Vitamin B12 deficiency anemia due to intrinsic factor deficiency: Secondary | ICD-10-CM | POA: Diagnosis not present

## 2021-03-24 ENCOUNTER — Other Ambulatory Visit: Payer: Self-pay | Admitting: Cardiovascular Disease

## 2021-03-25 ENCOUNTER — Ambulatory Visit: Payer: Medicare Other | Admitting: Neurology

## 2021-04-05 ENCOUNTER — Other Ambulatory Visit: Payer: Self-pay | Admitting: Neurology

## 2021-04-05 NOTE — Telephone Encounter (Signed)
Called and left message for patient to call back to make appt with Clarise Cruz

## 2021-04-05 NOTE — Telephone Encounter (Signed)
Thanks. Needs appt for further refills

## 2021-04-05 NOTE — Telephone Encounter (Signed)
Pls schedule him with Clarise Cruz, thanks

## 2021-04-09 DIAGNOSIS — D485 Neoplasm of uncertain behavior of skin: Secondary | ICD-10-CM | POA: Diagnosis not present

## 2021-04-09 DIAGNOSIS — C44329 Squamous cell carcinoma of skin of other parts of face: Secondary | ICD-10-CM | POA: Diagnosis not present

## 2021-04-09 DIAGNOSIS — L738 Other specified follicular disorders: Secondary | ICD-10-CM | POA: Diagnosis not present

## 2021-04-09 DIAGNOSIS — L905 Scar conditions and fibrosis of skin: Secondary | ICD-10-CM | POA: Diagnosis not present

## 2021-04-09 DIAGNOSIS — Z85828 Personal history of other malignant neoplasm of skin: Secondary | ICD-10-CM | POA: Diagnosis not present

## 2021-04-15 ENCOUNTER — Ambulatory Visit: Payer: Medicare Other | Admitting: Cardiovascular Disease

## 2021-04-15 ENCOUNTER — Ambulatory Visit: Payer: Medicare Other | Admitting: Neurology

## 2021-04-19 DIAGNOSIS — D0439 Carcinoma in situ of skin of other parts of face: Secondary | ICD-10-CM | POA: Diagnosis not present

## 2021-04-23 ENCOUNTER — Ambulatory Visit (INDEPENDENT_AMBULATORY_CARE_PROVIDER_SITE_OTHER): Payer: Medicare Other

## 2021-04-23 DIAGNOSIS — I255 Ischemic cardiomyopathy: Secondary | ICD-10-CM

## 2021-04-23 LAB — CUP PACEART REMOTE DEVICE CHECK
Battery Remaining Longevity: 13 mo
Battery Voltage: 2.88 V
Brady Statistic AP VP Percent: 6.74 %
Brady Statistic AP VS Percent: 80.45 %
Brady Statistic AS VP Percent: 2.97 %
Brady Statistic AS VS Percent: 9.84 %
Brady Statistic RA Percent Paced: 84.19 %
Brady Statistic RV Percent Paced: 9.65 %
Date Time Interrogation Session: 20220426043825
HighPow Impedance: 36 Ohm
HighPow Impedance: 54 Ohm
Implantable Lead Implant Date: 19990520
Implantable Lead Implant Date: 20011228
Implantable Lead Location: 753859
Implantable Lead Location: 753860
Implantable Lead Model: 6940
Implantable Lead Model: 6947
Implantable Pulse Generator Implant Date: 20140331
Lead Channel Impedance Value: 323 Ohm
Lead Channel Impedance Value: 342 Ohm
Lead Channel Impedance Value: 399 Ohm
Lead Channel Pacing Threshold Amplitude: 0.75 V
Lead Channel Pacing Threshold Amplitude: 0.875 V
Lead Channel Pacing Threshold Pulse Width: 0.4 ms
Lead Channel Pacing Threshold Pulse Width: 0.4 ms
Lead Channel Sensing Intrinsic Amplitude: 0.75 mV
Lead Channel Sensing Intrinsic Amplitude: 0.75 mV
Lead Channel Sensing Intrinsic Amplitude: 13 mV
Lead Channel Sensing Intrinsic Amplitude: 13 mV
Lead Channel Setting Pacing Amplitude: 2 V
Lead Channel Setting Pacing Amplitude: 2.5 V
Lead Channel Setting Pacing Pulse Width: 0.4 ms
Lead Channel Setting Sensing Sensitivity: 0.3 mV

## 2021-05-01 ENCOUNTER — Ambulatory Visit: Payer: Medicare Other | Admitting: Cardiovascular Disease

## 2021-05-07 DIAGNOSIS — D510 Vitamin B12 deficiency anemia due to intrinsic factor deficiency: Secondary | ICD-10-CM | POA: Diagnosis not present

## 2021-05-15 NOTE — Progress Notes (Signed)
Remote ICD transmission.   

## 2021-05-18 ENCOUNTER — Other Ambulatory Visit: Payer: Self-pay | Admitting: Cardiovascular Disease

## 2021-05-21 DIAGNOSIS — T8141XA Infection following a procedure, superficial incisional surgical site, initial encounter: Secondary | ICD-10-CM | POA: Diagnosis not present

## 2021-05-21 DIAGNOSIS — L905 Scar conditions and fibrosis of skin: Secondary | ICD-10-CM | POA: Diagnosis not present

## 2021-05-21 DIAGNOSIS — L738 Other specified follicular disorders: Secondary | ICD-10-CM | POA: Diagnosis not present

## 2021-06-24 ENCOUNTER — Telehealth: Payer: Self-pay | Admitting: Neurology

## 2021-06-24 ENCOUNTER — Telehealth: Payer: Self-pay | Admitting: Cardiovascular Disease

## 2021-06-24 ENCOUNTER — Other Ambulatory Visit: Payer: Self-pay | Admitting: *Deleted

## 2021-06-24 MED ORDER — FENOFIBRATE 160 MG PO TABS: 160.0000 mg | ORAL_TABLET | Freq: Every day | ORAL | 0 refills | Status: DC

## 2021-06-24 MED ORDER — CARVEDILOL 6.25 MG PO TABS: 6.2500 mg | ORAL_TABLET | Freq: Two times a day (BID) | ORAL | 0 refills | Status: DC

## 2021-06-24 MED ORDER — AMIODARONE HCL 100 MG PO TABS: 50.0000 mg | ORAL_TABLET | Freq: Every day | ORAL | 0 refills | Status: DC

## 2021-06-24 MED ORDER — SPIRONOLACTONE 25 MG PO TABS: 12.5000 mg | ORAL_TABLET | Freq: Every day | ORAL | 0 refills | Status: DC

## 2021-06-24 MED ORDER — LISINOPRIL 5 MG PO TABS: 5.0000 mg | ORAL_TABLET | Freq: Every day | ORAL | 0 refills | Status: DC

## 2021-06-24 MED ORDER — MEMANTINE HCL 10 MG PO TABS: 10.0000 mg | ORAL_TABLET | Freq: Two times a day (BID) | ORAL | 3 refills | Status: DC

## 2021-06-24 NOTE — Telephone Encounter (Signed)
90 day refills sent to [facility].

## 2021-06-24 NOTE — Telephone Encounter (Signed)
*  STAT* If patient is at the pharmacy, call can be transferred to refill team.   1. Which medications need to be refilled? (please list name of each medication and dose if known) new prescriptions for Carvedilol, Lisinopril, Fenofibrate, Amiodarone and Spironolactone- changing pharmacy  2. Which pharmacy/location (including street and city if local pharmacy) is medication to be sent to? [facility]  3. Do they need a 30 day or 90 day supply? 90 days and refills

## 2021-06-24 NOTE — Telephone Encounter (Signed)
Representative from [facility] called in stating the patient is going to be using a different pharmacy that will pack his medications and send to his home. He need refill of memantine sent to [facility]

## 2021-06-24 NOTE — Telephone Encounter (Signed)
Rx sent to [facility] for Memantine 10mg  BID

## 2021-06-26 DIAGNOSIS — M1990 Unspecified osteoarthritis, unspecified site: Secondary | ICD-10-CM | POA: Diagnosis not present

## 2021-06-26 DIAGNOSIS — R413 Other amnesia: Secondary | ICD-10-CM | POA: Diagnosis not present

## 2021-06-26 DIAGNOSIS — I130 Hypertensive heart and chronic kidney disease with heart failure and stage 1 through stage 4 chronic kidney disease, or unspecified chronic kidney disease: Secondary | ICD-10-CM | POA: Diagnosis not present

## 2021-06-27 DIAGNOSIS — N183 Chronic kidney disease, stage 3 unspecified: Secondary | ICD-10-CM | POA: Diagnosis not present

## 2021-06-27 DIAGNOSIS — I5022 Chronic systolic (congestive) heart failure: Secondary | ICD-10-CM | POA: Diagnosis not present

## 2021-06-27 DIAGNOSIS — E785 Hyperlipidemia, unspecified: Secondary | ICD-10-CM | POA: Diagnosis not present

## 2021-06-27 DIAGNOSIS — I130 Hypertensive heart and chronic kidney disease with heart failure and stage 1 through stage 4 chronic kidney disease, or unspecified chronic kidney disease: Secondary | ICD-10-CM | POA: Diagnosis not present
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
## 2021-07-11 ENCOUNTER — Encounter: Payer: Self-pay | Admitting: Cardiovascular Disease

## 2021-07-11 ENCOUNTER — Ambulatory Visit (INDEPENDENT_AMBULATORY_CARE_PROVIDER_SITE_OTHER): Payer: Medicare Other | Admitting: Cardiovascular Disease

## 2021-07-11 ENCOUNTER — Other Ambulatory Visit: Payer: Self-pay

## 2021-07-11 VITALS — BP 118/62 | HR 62 | Ht 70.0 in | Wt 156.6 lb

## 2021-07-11 DIAGNOSIS — I255 Ischemic cardiomyopathy: Secondary | ICD-10-CM

## 2021-07-11 DIAGNOSIS — I472 Ventricular tachycardia, unspecified: Secondary | ICD-10-CM

## 2021-07-11 DIAGNOSIS — I2510 Atherosclerotic heart disease of native coronary artery without angina pectoris: Secondary | ICD-10-CM | POA: Diagnosis not present

## 2021-07-11 DIAGNOSIS — I480 Paroxysmal atrial fibrillation: Secondary | ICD-10-CM

## 2021-07-11 NOTE — Patient Instructions (Signed)

## 2021-07-12 ENCOUNTER — Ambulatory Visit: Payer: Medicare Other | Admitting: Cardiovascular Disease

## 2021-07-23 ENCOUNTER — Ambulatory Visit (INDEPENDENT_AMBULATORY_CARE_PROVIDER_SITE_OTHER): Payer: Medicare Other

## 2021-07-23 DIAGNOSIS — I4891 Unspecified atrial fibrillation: Secondary | ICD-10-CM | POA: Diagnosis not present

## 2021-07-23 DIAGNOSIS — D510 Vitamin B12 deficiency anemia due to intrinsic factor deficiency: Secondary | ICD-10-CM | POA: Diagnosis not present

## 2021-07-23 DIAGNOSIS — K529 Noninfective gastroenteritis and colitis, unspecified: Secondary | ICD-10-CM | POA: Diagnosis not present

## 2021-07-23 DIAGNOSIS — I255 Ischemic cardiomyopathy: Secondary | ICD-10-CM | POA: Diagnosis not present

## 2021-07-23 DIAGNOSIS — R112 Nausea with vomiting, unspecified: Secondary | ICD-10-CM | POA: Diagnosis not present

## 2021-07-23 LAB — CUP PACEART REMOTE DEVICE CHECK
Battery Remaining Longevity: 10 mo
Battery Voltage: 2.86 V
Brady Statistic AP VP Percent: 7.73 %
Brady Statistic AP VS Percent: 80.95 %
Brady Statistic AS VP Percent: 3.32 %
Brady Statistic AS VS Percent: 7.99 %
Brady Statistic RA Percent Paced: 85.04 %
Brady Statistic RV Percent Paced: 10.97 %
Date Time Interrogation Session: 20220726012504
HighPow Impedance: 43 Ohm
HighPow Impedance: 64 Ohm
Implantable Lead Implant Date: 19990520
Implantable Lead Implant Date: 20011228
Implantable Lead Location: 753859
Implantable Lead Location: 753860
Implantable Lead Model: 6940
Implantable Lead Model: 6947
Implantable Pulse Generator Implant Date: 20140331
Lead Channel Impedance Value: 342 Ohm
Lead Channel Impedance Value: 399 Ohm
Lead Channel Impedance Value: 456 Ohm
Lead Channel Pacing Threshold Amplitude: 0.75 V
Lead Channel Pacing Threshold Amplitude: 1 V
Lead Channel Pacing Threshold Pulse Width: 0.4 ms
Lead Channel Pacing Threshold Pulse Width: 0.4 ms
Lead Channel Sensing Intrinsic Amplitude: 0.5 mV
Lead Channel Sensing Intrinsic Amplitude: 0.5 mV
Lead Channel Sensing Intrinsic Amplitude: 11.125 mV
Lead Channel Sensing Intrinsic Amplitude: 11.125 mV
Lead Channel Setting Pacing Amplitude: 2 V
Lead Channel Setting Pacing Amplitude: 2.5 V
Lead Channel Setting Pacing Pulse Width: 0.4 ms
Lead Channel Setting Sensing Sensitivity: 0.3 mV

## 2021-07-31 DIAGNOSIS — L923 Foreign body granuloma of the skin and subcutaneous tissue: Secondary | ICD-10-CM | POA: Diagnosis not present

## 2021-08-02 ENCOUNTER — Other Ambulatory Visit: Payer: Self-pay | Admitting: Registered Nurse

## 2021-08-02 ENCOUNTER — Ambulatory Visit
Admission: RE | Admit: 2021-08-02 | Discharge: 2021-08-02 | Disposition: A | Discharge status: Home / Self Care | Payer: Medicare Other | Source: Ambulatory Visit | Attending: Registered Nurse | Admitting: Registered Nurse

## 2021-08-02 DIAGNOSIS — I5022 Chronic systolic (congestive) heart failure: Secondary | ICD-10-CM | POA: Diagnosis not present

## 2021-08-02 DIAGNOSIS — Z9889 Other specified postprocedural states: Secondary | ICD-10-CM | POA: Diagnosis not present

## 2021-08-02 DIAGNOSIS — G319 Degenerative disease of nervous system, unspecified: Secondary | ICD-10-CM | POA: Diagnosis not present

## 2021-08-02 DIAGNOSIS — I130 Hypertensive heart and chronic kidney disease with heart failure and stage 1 through stage 4 chronic kidney disease, or unspecified chronic kidney disease: Secondary | ICD-10-CM | POA: Diagnosis not present

## 2021-08-02 DIAGNOSIS — M795 Residual foreign body in soft tissue: Secondary | ICD-10-CM | POA: Diagnosis not present

## 2021-08-02 DIAGNOSIS — I6200 Nontraumatic subdural hemorrhage, unspecified: Secondary | ICD-10-CM | POA: Diagnosis not present

## 2021-08-02 DIAGNOSIS — I4891 Unspecified atrial fibrillation: Secondary | ICD-10-CM | POA: Diagnosis not present

## 2021-08-02 DIAGNOSIS — N1832 Chronic kidney disease, stage 3b: Secondary | ICD-10-CM | POA: Diagnosis not present

## 2021-08-02 DIAGNOSIS — Z87828 Personal history of other (healed) physical injury and trauma: Secondary | ICD-10-CM | POA: Diagnosis not present

## 2021-08-07 DIAGNOSIS — M952 Other acquired deformity of head: Secondary | ICD-10-CM | POA: Diagnosis not present

## 2021-08-07 DIAGNOSIS — Z9889 Other specified postprocedural states: Secondary | ICD-10-CM | POA: Diagnosis not present

## 2021-08-13 ENCOUNTER — Other Ambulatory Visit: Payer: Self-pay | Admitting: Neurosurgery

## 2021-08-13 ENCOUNTER — Telehealth: Payer: Self-pay | Admitting: *Deleted

## 2021-08-13 NOTE — Telephone Encounter (Signed)
.    Name: [name]  DOB: 1935-09-20  MRN: [id number]   Primary Cardiologist: Mertie Moores, MD  Chart reviewed as part of pre-operative protocol coverage. Patient was contacted 08/13/2021 in reference to pre-operative risk assessment for pending surgery as outlined below.  [name] was last seen on 07/11/21 by Dr. Acie Fredrickson.  Since that day, [name] has done fine from a cardiac standpoint. He has continued to be active, walking regularly. He can complete 4 METs without anginal complaints.  Therefore, based on ACC/AHA guidelines, the patient would be at acceptable risk for the planned procedure without further cardiovascular testing.   The patient was advised that if he develops new symptoms prior to surgery to contact our office to arrange for a follow-up visit, and he verbalized understanding.  If needed, patient can hold aspirin 7 days prior to his upcoming surgery and should be restarted when cleared to do so by his surgeon.   I will route this recommendation to the requesting party via Epic fax function and remove from pre-op pool. Please call with questions.  Abigail Butts, PA-C 08/13/2021, 3:01 PM

## 2021-08-13 NOTE — Telephone Encounter (Signed)
   [facility] Pre-operative Risk Assessment    Patient Name: [name]  DOB: October 17, 1935 MRN: [id number]  HEARTCARE STAFF:  - IMPORTANT!!!!!! Under Visit Info/Reason for Call, type in Other and utilize the format Clearance MM/DD/YY or Clearance TBD. Do not use dashes or single digits. - Please review there is not already an duplicate clearance open for this procedure. - If request is for dental extraction, please clarify the # of teeth to be extracted. - If the patient is currently at the dentist's office, call Pre-Op Callback Staff (MA/nurse) to input urgent request.  - If the patient is not currently in the dentist office, please route to the Pre-Op pool.  Request for surgical clearance:  What type of surgery is being performed? Left cranial wound revision, removal of plate, possible removal of bone flap  When is this surgery scheduled? 08/23/21  What type of clearance is required (medical clearance vs. Pharmacy clearance to hold med vs. Both)? Medical  Are there any medications that need to be held prior to surgery and how long? None listed, but pt on ASA 81 mg  Practice name and name of physician performing surgery? [facility], Dr Erline Levine  What is the office phone number? [phone]   7.   What is the office fax number? [place]  8.   Anesthesia type (None, local, MAC, general) ? General   Kendell Sagraves L 08/13/2021, 2:38 PM  _________________________________________________________________   (provider comments below)

## 2021-08-16 ENCOUNTER — Encounter: Payer: Self-pay | Admitting: Internal Medicine

## 2021-08-16 NOTE — Progress Notes (Signed)
PERIOPERATIVE PRESCRIPTION FOR IMPLANTED CARDIAC DEVICE PROGRAMMING  Patient Information: Name:  [name]  DOB:  09-01-1935  MRN:  [id number]    Gleason, Ginger E, RN  [facility] Device Cc: Clayborn Bigness, RN; Nena Polio, RN Planned Procedure:  Left cranial wound revision, removal of plate, possible removal of bone flap  Surgeon:  Erline Levine  Date of Procedure:  08/23/21  Cautery will be used.  Position during surgery:     Please send documentation back to:  [facility] (Fax # [phone])   Gleason, Thornell Mule, RN  08/16/2021 3:57 PM  Device Information:  Clinic EP Physician:  Virl Axe, MD   Device Type:  Defibrillator Manufacturer and Phone #:  Medtronic: [id number] Pacemaker Dependent?:  Unknown, pt is overdue for in-clinic check, last OV was 2019 Date of Last Device Check:  07/23/21 remote Normal Device Function?:  Yes.    Electrophysiologist's Recommendations:  Have magnet available. Provide continuous ECG monitoring when magnet is used or reprogramming is to be performed.  Procedure will likely interfere with device function.  Device should be programmed:  Tachy therapies disabled and Asynchronous pacing during procedure and returned to normal programming after procedure Device should be checked by industry prior to procedure  Per [facility] Standing Orders, York Ram, RN  5:24 PM 08/16/2021

## 2021-08-16 NOTE — Progress Notes (Signed)
Surgical Instructions    Your procedure is scheduled on August 26th Friday.  Report to [facility] Main Entrance "A" at 8 A.M., then check in with the Admitting office.  Call this number if you have problems the morning of surgery:  [phone]   If you have any questions prior to your surgery date call [phone]: Open Monday-Friday 8am-4pm    Remember:  Do not eat after midnight the night before your surgery  You may drink clear liquids until 7am the morning of your surgery.   Clear liquids allowed are: Water, Non-Citrus Juices (without pulp), Carbonated Beverages, Clear Tea, Black Coffee Only, and Gatorade    Take these medicines the morning of surgery with A SIP OF WATER  amiodarone (PACERONE) 100 MG tablet carvedilol (COREG) 6.25 MG tablet escitalopram (LEXAPRO) 5 MG tablet fenofibrate 160 MG tablet levothyroxine (SYNTHROID, LEVOTHROID) 75 MCG tablet memantine (NAMENDA) 10 MG tablet  IF NEEDED nitroGLYCERIN (NITROSTAT) 0.4 MG SL tablet   As of today, STOP taking any Aspirin (unless otherwise instructed by your surgeon) Aleve, Naproxen, Ibuprofen, Motrin, Advil, Goody's, BC's, all herbal medications, fish oil, and all vitamins.          Do not wear jewelry  Do not wear lotions, powders, colognes, or deodorant. Do not shave 48 hours prior to surgery.  Men may shave face and neck. Do not bring valuables to the hospital. DO Not wear nail polish, gel polish, artificial nails, or any other type of covering on  natural nails including finger and toenails. If patients have artificial nails, gel coating, etc. that need to be removed by a nail salon please have this removed prior to surgery or surgery may need to be canceled/delayed if the surgeon/ anesthesia feels like the patient is unable to be adequately monitored.             [facility] is not responsible for any belongings or valuables.  Do NOT Smoke (Tobacco/Vaping) or drink Alcohol 24 hours prior to your procedure If  you use a CPAP at night, you may bring all equipment for your overnight stay.   Contacts, glasses, dentures or bridgework may not be worn into surgery, please bring cases for these belongings   For patients admitted to the hospital, discharge time will be determined by your treatment team.   Patients discharged the day of surgery will not be allowed to drive home, and someone needs to stay with them for 24 hours.  ONLY 1 SUPPORT PERSON MAY BE PRESENT WHILE YOU ARE IN SURGERY. IF YOU ARE TO BE ADMITTED ONCE YOU ARE IN YOUR ROOM YOU WILL BE ALLOWED TWO (2) VISITORS.  Minor children may have two parents present. Special consideration for safety and communication needs will be reviewed on a case by case basis.  Special instructions:    Oral Hygiene is also important to reduce your risk of infection.  Remember - BRUSH YOUR TEETH THE MORNING OF SURGERY WITH YOUR REGULAR TOOTHPASTE   [facility]- Preparing For Surgery  Before surgery, you can play an important role. Because skin is not sterile, your skin needs to be as free of germs as possible. You can reduce the number of germs on your skin by washing with CHG (chlorahexidine gluconate) Soap before surgery.  CHG is an antiseptic cleaner which kills germs and bonds with the skin to continue killing germs even after washing.     Please do not use if you have an allergy to CHG or antibacterial soaps. If your  skin becomes reddened/irritated stop using the CHG.  Do not shave (including legs and underarms) for at least 48 hours prior to first CHG shower. It is OK to shave your face.  Please follow these instructions carefully.     Shower the NIGHT BEFORE SURGERY and the MORNING OF SURGERY with CHG Soap.   If you chose to wash your hair, wash your hair first as usual with your normal shampoo. After you shampoo, rinse your hair and body thoroughly to remove the shampoo.  Then [employer] and genitals (private parts) with your normal soap and rinse  thoroughly to remove soap.  After that Use CHG Soap as you would any other liquid soap. You can apply CHG directly to the skin and wash gently with a scrungie or a clean washcloth.   Apply the CHG Soap to your body ONLY FROM THE NECK DOWN.  Do not use on open wounds or open sores. Avoid contact with your eyes, ears, mouth and genitals (private parts). Wash Face and genitals (private parts)  with your normal soap.   Wash thoroughly, paying special attention to the area where your surgery will be performed.  Thoroughly rinse your body with warm water from the neck down.  DO NOT shower/wash with your normal soap after using and rinsing off the CHG Soap.  Pat yourself dry with a CLEAN TOWEL.  Wear CLEAN PAJAMAS to bed the night before surgery  Place CLEAN SHEETS on your bed the night before your surgery  DO NOT SLEEP WITH PETS.   Day of Surgery:  Take a shower with CHG soap. Wear Clean/Comfortable clothing the morning of surgery Do not apply any deodorants/lotions.   Remember to brush your teeth WITH YOUR REGULAR TOOTHPASTE.   Please read over the following fact sheets that you were given.

## 2021-08-19 ENCOUNTER — Encounter (HOSPITAL_COMMUNITY)
Admission: RE | Admit: 2021-08-19 | Discharge: 2021-08-19 | Disposition: A | Discharge status: Home / Self Care | Payer: Medicare Other | Source: Ambulatory Visit | Attending: Neurosurgery | Admitting: Neurosurgery

## 2021-08-19 ENCOUNTER — Other Ambulatory Visit: Payer: Self-pay

## 2021-08-19 ENCOUNTER — Encounter (HOSPITAL_COMMUNITY): Payer: Self-pay

## 2021-08-19 DIAGNOSIS — Z20822 Contact with and (suspected) exposure to covid-19: Secondary | ICD-10-CM | POA: Insufficient documentation

## 2021-08-19 DIAGNOSIS — Z01812 Encounter for preprocedural laboratory examination: Secondary | ICD-10-CM | POA: Insufficient documentation

## 2021-08-19 HISTORY — DX: Atherosclerotic heart disease of native coronary artery without angina pectoris: I25.10

## 2021-08-19 HISTORY — DX: Depression, unspecified: F32.A

## 2021-08-19 HISTORY — DX: Pneumonia, unspecified organism: J18.9

## 2021-08-19 HISTORY — DX: Other complications of anesthesia, initial encounter: T88.59XA

## 2021-08-19 HISTORY — DX: Unspecified dementia, unspecified severity, without behavioral disturbance, psychotic disturbance, mood disturbance, and anxiety: F03.90

## 2021-08-19 LAB — CBC
HCT: 39.1 % (ref 39.0–52.0)
Hemoglobin: 12.5 g/dL — ABNORMAL LOW (ref 13.0–17.0)
MCH: 29.6 pg (ref 26.0–34.0)
MCHC: 32 g/dL (ref 30.0–36.0)
MCV: 92.7 fL (ref 80.0–100.0)
Platelets: 225 10*3/uL (ref 150–400)
RBC: 4.22 MIL/uL (ref 4.22–5.81)
RDW: 15.7 % — ABNORMAL HIGH (ref 11.5–15.5)
WBC: 6.6 10*3/uL (ref 4.0–10.5)
nRBC: 0 % (ref 0.0–0.2)

## 2021-08-19 LAB — BASIC METABOLIC PANEL
Anion gap: 6 (ref 5–15)
BUN: 13 mg/dL (ref 8–23)
CO2: 29 mmol/L (ref 22–32)
Calcium: 8.6 mg/dL — ABNORMAL LOW (ref 8.9–10.3)
Chloride: 105 mmol/L (ref 98–111)
Creatinine, Ser: 1.18 mg/dL (ref 0.61–1.24)
GFR, Estimated: >60 mL/min (ref >60)
Glucose, Bld: 86 mg/dL (ref 70–99)
Potassium: 4.1 mmol/L (ref 3.5–5.1)
Sodium: 140 mmol/L (ref 135–145)

## 2021-08-19 LAB — TYPE AND SCREEN
ABO/RH(D): O POS
Antibody Screen: NEGATIVE

## 2021-08-19 LAB — SURGICAL PCR SCREEN
MRSA, PCR: NEGATIVE
Staphylococcus aureus: POSITIVE — AB

## 2021-08-19 LAB — SARS CORONAVIRUS 2 (TAT 6-24 HRS): SARS Coronavirus 2: NEGATIVE

## 2021-08-19 NOTE — Progress Notes (Signed)
PCP - Dr. Reynaldo Minium Cardiologist - Dr. Acie Fredrickson EP - Dr. Caryl Comes  PPM/ICD - ICD Device Orders - received, printed and in chart  Rep Notified - yes, second email sent to [employer] requesting industry to be present morning of surgery  Chest x-ray - n/a EKG - 07/11/21 Stress Test - 01/25/2007 ECHO - 12/22/2017 Cardiac Cath - 01/21/2011  Sleep Study - n/a CPAP -   Fasting Blood Sugar - n/a Checks Blood Sugar _____ times a day  Blood Thinner Instructions: n/a Aspirin Instructions: last dose was on 08/16/21  ERAS Protcol - clears until 07:00am morning of surgery PRE-SURGERY Ensure or G2- n/a  COVID TEST- at PAT appointment   Anesthesia review: yes, cardiac history  Patient denies shortness of breath, fever, cough and chest pain at PAT appointment   All instructions explained to the patient, with a verbal understanding of the material. Patient agrees to go over the instructions while at home for a better understanding. Patient also instructed to self quarantine after being tested for COVID-19. The opportunity to ask questions was provided.

## 2021-08-19 NOTE — Progress Notes (Signed)
Remote ICD transmission.   

## 2021-08-20 ENCOUNTER — Telehealth: Payer: Self-pay | Admitting: Cardiovascular Disease

## 2021-08-20 NOTE — Telephone Encounter (Signed)
Pt is returning call from earlier today. Pt is not sure why he was called. Please advise pt further

## 2021-08-20 NOTE — Progress Notes (Signed)
Anesthesia Chart Review:  [facility] for history of CAD s/p MI and subsequent CABG in 1995, HFrEF, s/p ICD placement, V. tach storm maintained on amiodarone, atrial arrhythmia/A. fib (not on Coumadin secondary to subdural hematoma).  Preop clearance per telephone encounter 08/13/2021, "Chart reviewed as part of pre-operative protocol coverage. Patient was contacted 08/13/2021 in reference to pre-operative risk assessment for pending surgery as outlined below.  [name] was last seen on 07/11/21 by Dr. Acie Fredrickson.  Since that day, [name] has done fine from a cardiac standpoint. He has continued to be active, walking regularly. He can complete 4 METs without anginal complaints. Therefore, based on ACC/AHA guidelines, the patient would be at acceptable risk for the planned procedure without further cardiovascular testing. The patient was advised that if he develops new symptoms prior to surgery to contact our office to arrange for a follow-up visit, and he verbalized understanding. If needed, patient can hold aspirin 7 days prior to his upcoming surgery and should be restarted when cleared to do so by his surgeon."  Perioperative cardiac device orders recommended device rep be present morning of surgery due to time since last in office interrogation.  Rep notified by PAT RN.  Preop labs reviewed, unremarkable.  Perioperative prescription for implanted cardiac device programming per note 08/16/2021: Device Information:   Clinic EP Physician:  Virl Axe, MD    Device Type:  Defibrillator Manufacturer and Phone #:  [employer]tronic: [id number] Pacemaker Dependent?:  Unknown, pt is overdue for in-clinic check, last OV was 2019 Date of Last Device Check:  07/23/21 remote           Normal Device Function?:  Yes.     Electrophysiologist's Recommendations:   Have magnet available. Provide continuous ECG monitoring when magnet is used or reprogramming is to be performed.  Procedure will  likely interfere with device function.  Device should be programmed:  Tachy therapies disabled and Asynchronous pacing during procedure and returned to normal programming after procedure Device should be checked by industry prior to procedure  TTE 12/22/2017: - Left ventricle: The cavity size was mildly dilated. Wall    thickness was normal. Systolic function was severely reduced. The    estimated ejection fraction was in the range of 25% to 30%. There    is akinesis of the mid-apicalanteroseptal, anterior,    anterolateral, lateral, inferolateral, and apical myocardium.    Doppler parameters are consistent with abnormal left ventricular    relaxation (grade 1 diastolic dysfunction). Acoustic contrast    opacification revealed no evidence ofthrombus.  - Mitral valve: Moderately calcified annulus.  - Left atrium: The atrium was severely dilated.  - Right ventricle: Pacer wire or catheter noted in right ventricle.    Wynonia Musty [facility]/Anesthesiology Phone [phone] 08/20/2021 12:13 PM

## 2021-08-20 NOTE — Anesthesia Preprocedure Evaluation (Addendum)
Anesthesia Evaluation  Patient identified by MRN, date of birth, ID band Patient awake    Reviewed: Allergy & Precautions, NPO status , Patient's Chart, lab work & pertinent test results  Airway Mallampati: I       Dental  (+) Partial Upper, Partial Lower,    Pulmonary    Pulmonary exam normal        Cardiovascular hypertension, + CABG  Normal cardiovascular exam Rhythm:Regular Rate:Normal     Neuro/Psych    GI/Hepatic   Endo/Other    Renal/GU      Musculoskeletal   Abdominal Normal abdominal exam  (+)   Peds  Hematology   Anesthesia Other Findings   Reproductive/Obstetrics                                                             Anesthesia Evaluation  Patient identified by MRN, date of birth, ID band Patient awake    Reviewed: Allergy & Precautions, NPO status , Patient's Chart, lab work & pertinent test results  History of Anesthesia Complications Negative for: history of anesthetic complications  Airway Mallampati: III  TM Distance: >3 FB Neck ROM: Full    Dental  (+) Partial Lower, Partial Upper, Missing,    Pulmonary neg shortness of breath, COPD,    breath sounds clear to auscultation       Cardiovascular hypertension, Pt. on medications and Pt. on home beta blockers + CAD, + Past MI, + CABG and +CHF  + dysrhythmias Atrial Fibrillation + pacemaker + Cardiac Defibrillator  Rhythm:Regular     Neuro/Psych negative neurological ROS     GI/Hepatic Neg liver ROS, hiatal hernia,   Endo/Other  Hypothyroidism   Renal/GU Renal InsufficiencyRenal disease     Musculoskeletal   Abdominal   Peds  Hematology   Anesthesia Other Findings EF 25%-30%  Reproductive/Obstetrics                            Anesthesia Physical Anesthesia Plan  ASA: III  Anesthesia Plan: General   Post-op Pain Management:    Induction:  Intravenous  PONV Risk Score and Plan: 2  Airway Management Planned: Oral ETT  Additional Equipment: None  Intra-op Plan:   Post-operative Plan: Extubation in OR  Informed Consent: I have reviewed the patients History and Physical, chart, labs and discussed the procedure including the risks, benefits and alternatives for the proposed anesthesia with the patient or authorized representative who has indicated his/her understanding and acceptance.   Dental advisory given  Plan Discussed with: CRNA and Surgeon  Anesthesia Plan Comments:         Anesthesia Quick Evaluation  Anesthesia Physical Anesthesia Plan  ASA: 3  Anesthesia Plan: General   Post-op Pain Management:    Induction: Intravenous  PONV Risk Score and Plan: 2 and Ondansetron  Airway Management Planned: Oral ETT  Additional Equipment: None  Intra-op Plan:   Post-operative Plan: Extubation in OR  Informed Consent: I have reviewed the patients History and Physical, chart, labs and discussed the procedure including the risks, benefits and alternatives for the proposed anesthesia with the patient or authorized representative who has indicated his/her understanding and acceptance.     Dental advisory given  Plan Discussed with: CRNA  Anesthesia Plan  Comments: (PAT note by Alejandro Caldwell, PA-C: [facility] for history of CAD s/p MI and subsequent CABG in 1995, HFrEF, s/p ICD placement, V. tach storm maintained on amiodarone, atrial arrhythmia/A. fib (not on Coumadin secondary to subdural hematoma).  Preop clearance per telephone encounter 08/13/2021, "Chart reviewed as part of pre-operative protocol coverage. Patient was contacted8/16/2022in reference to pre-operative risk assessment for pending surgery as outlined below. Alejandro Lamp Sloanwas last seen on 7/14/22by Dr. Acie Fredrickson. Since that day, [name] done fine from a cardiac standpoint. He has continued to be active, walking  regularly.He can complete 4 METs without anginal complaints. Therefore, based on ACC/AHA guidelines, the patient would be at acceptable risk for the planned procedure without further cardiovascular testing. The patient was advised that ifhedevelops new symptoms prior to surgery to contact our office to arrange for a follow-up visit, and heverbalized understanding. If needed, patient can hold aspirin 7 days prior to his upcoming surgery and should be restarted when cleared to do so by his surgeon."  Perioperative cardiac device orders recommended device rep be present morning of surgery due to time since last in office interrogation.  Rep notified by PAT RN.  Preop labs reviewed, unremarkable.  Perioperative prescription for implanted cardiac device programming per note 08/16/2021: Device Information:  Clinic EP Physician:Steven Caryl Comes, MD  Device Type:Defibrillator Manufacturer and Phone #:[employer]: [id number] Pacemaker Dependent?:Unknown, pt is overdue for in-clinic check, last OV was 2019 Date of Last Device Check:07/23/21 remoteNormal Device Function?:Yes.  Electrophysiologist's Recommendations:  . Have magnet available. . Provide continuous ECG monitoring when magnet is used or reprogramming is to be performed. . Procedure will likely interfere with device function. Device should be programmed: Tachy therapies disabled and Asynchronous pacing during procedure and returned to normal programming after procedure . Device should be checked by industry prior to procedure  TTE 12/22/2017: - Left ventricle: The cavity size was mildly dilated. Wall  thickness was normal. Systolic function was severely reduced. The  estimated ejection fraction was in the range of 25% to 30%. There  is akinesis of the mid-apicalanteroseptal, anterior,  anterolateral, lateral, inferolateral, and apical myocardium.  Doppler parameters are consistent with abnormal  left ventricular  relaxation (grade 1 diastolic dysfunction). Acoustic contrast  opacification revealed no evidence ofthrombus.  - Mitral valve: Moderately calcified annulus.  - Left atrium: The atrium was severely dilated.  - Right ventricle: Pacer wire or catheter noted in right ventricle.   )       Anesthesia Quick Evaluation

## 2021-08-23 ENCOUNTER — Encounter (HOSPITAL_COMMUNITY): Payer: Self-pay | Admitting: Neurosurgery

## 2021-08-23 ENCOUNTER — Encounter (HOSPITAL_COMMUNITY)
Admission: RE | Disposition: A | Discharge status: Home / Self Care | Payer: Self-pay | Source: Home / Self Care | Attending: Neurosurgery

## 2021-08-23 ENCOUNTER — Other Ambulatory Visit: Payer: Self-pay

## 2021-08-23 ENCOUNTER — Inpatient Hospital Stay (HOSPITAL_COMMUNITY)
Admission: RE | Admit: 2021-08-23 | Discharge: 2021-08-24 | DRG: 982 | Disposition: A | Discharge status: Home / Self Care | Payer: Medicare Other | Attending: Neurosurgery | Admitting: Neurosurgery

## 2021-08-23 ENCOUNTER — Inpatient Hospital Stay (HOSPITAL_COMMUNITY): Payer: Medicare Other | Admitting: Physician Assistant

## 2021-08-23 ENCOUNTER — Inpatient Hospital Stay (HOSPITAL_COMMUNITY): Payer: Medicare Other | Admitting: Certified Registered"

## 2021-08-23 DIAGNOSIS — N184 Chronic kidney disease, stage 4 (severe): Secondary | ICD-10-CM | POA: Diagnosis present

## 2021-08-23 DIAGNOSIS — Z79899 Other long term (current) drug therapy: Secondary | ICD-10-CM

## 2021-08-23 DIAGNOSIS — E785 Hyperlipidemia, unspecified: Secondary | ICD-10-CM | POA: Diagnosis not present

## 2021-08-23 DIAGNOSIS — I4891 Unspecified atrial fibrillation: Secondary | ICD-10-CM | POA: Diagnosis present

## 2021-08-23 DIAGNOSIS — F32A Depression, unspecified: Secondary | ICD-10-CM | POA: Diagnosis present

## 2021-08-23 DIAGNOSIS — T85618A Breakdown (mechanical) of other specified internal prosthetic devices, implants and grafts, initial encounter: Secondary | ICD-10-CM | POA: Diagnosis present

## 2021-08-23 DIAGNOSIS — Z951 Presence of aortocoronary bypass graft: Secondary | ICD-10-CM

## 2021-08-23 DIAGNOSIS — Y838 Other surgical procedures as the cause of abnormal reaction of the patient, or of later complication, without mention of misadventure at the time of the procedure: Secondary | ICD-10-CM | POA: Diagnosis present

## 2021-08-23 DIAGNOSIS — I509 Heart failure, unspecified: Secondary | ICD-10-CM | POA: Diagnosis present

## 2021-08-23 DIAGNOSIS — I2510 Atherosclerotic heart disease of native coronary artery without angina pectoris: Secondary | ICD-10-CM | POA: Diagnosis present

## 2021-08-23 DIAGNOSIS — I130 Hypertensive heart and chronic kidney disease with heart failure and stage 1 through stage 4 chronic kidney disease, or unspecified chronic kidney disease: Secondary | ICD-10-CM | POA: Diagnosis present

## 2021-08-23 DIAGNOSIS — E039 Hypothyroidism, unspecified: Secondary | ICD-10-CM | POA: Diagnosis present

## 2021-08-23 DIAGNOSIS — I110 Hypertensive heart disease with heart failure: Secondary | ICD-10-CM | POA: Diagnosis not present

## 2021-08-23 DIAGNOSIS — S0100XA Unspecified open wound of scalp, initial encounter: Secondary | ICD-10-CM | POA: Diagnosis present

## 2021-08-23 DIAGNOSIS — M952 Other acquired deformity of head: Secondary | ICD-10-CM | POA: Diagnosis not present

## 2021-08-23 DIAGNOSIS — I5022 Chronic systolic (congestive) heart failure: Secondary | ICD-10-CM | POA: Diagnosis not present

## 2021-08-23 HISTORY — PX: CRANIOTOMY: SHX93

## 2021-08-23 LAB — ABO/RH: ABO/RH(D): O POS

## 2021-08-23 SURGERY — CRANIOTOMY HEMATOMA EVACUATION SUBDURAL
Anesthesia: General | Laterality: Left

## 2021-08-23 MED ORDER — PHENYLEPHRINE 40 MCG/ML (10ML) SYRINGE FOR IV PUSH (FOR BLOOD PRESSURE SUPPORT)
PREFILLED_SYRINGE | INTRAVENOUS | Status: AC
  Filled 2021-08-23: qty 10

## 2021-08-23 MED ORDER — LACTATED RINGERS IV SOLN: INTRAVENOUS | Status: DC

## 2021-08-23 MED ORDER — B COMPLEX PO TABS: 1.0000 | ORAL_TABLET | Freq: Every day | ORAL | Status: DC

## 2021-08-23 MED ORDER — HYDROMORPHONE HCL 1 MG/ML IJ SOLN: 0.5000 mg | INTRAMUSCULAR | Status: DC | PRN

## 2021-08-23 MED ORDER — FENTANYL CITRATE (PF) 250 MCG/5ML IJ SOLN
INTRAMUSCULAR | Status: DC | PRN
  Administered 2021-08-23 (×2): 50 ug via INTRAVENOUS

## 2021-08-23 MED ORDER — FENTANYL CITRATE (PF) 250 MCG/5ML IJ SOLN
INTRAMUSCULAR | Status: AC
  Filled 2021-08-23: qty 5

## 2021-08-23 MED ORDER — SPIRONOLACTONE 12.5 MG HALF TABLET
12.5000 mg | ORAL_TABLET | Freq: Every day | ORAL | Status: DC
  Administered 2021-08-23: 12.5 mg via ORAL
  Filled 2021-08-23 (×2): qty 1

## 2021-08-23 MED ORDER — SODIUM CHLORIDE 0.9% FLUSH: 3.0000 mL | Freq: Two times a day (BID) | INTRAVENOUS | Status: DC

## 2021-08-23 MED ORDER — BUPIVACAINE-EPINEPHRINE (PF) 0.25% -1:200000 IJ SOLN
INTRAMUSCULAR | Status: DC | PRN
  Administered 2021-08-23: 5 mL via PERINEURAL

## 2021-08-23 MED ORDER — SUGAMMADEX SODIUM 200 MG/2ML IV SOLN
INTRAVENOUS | Status: DC | PRN
  Administered 2021-08-23: 200 mg via INTRAVENOUS

## 2021-08-23 MED ORDER — DOCUSATE SODIUM 100 MG PO CAPS
100.0000 mg | ORAL_CAPSULE | Freq: Two times a day (BID) | ORAL | Status: DC
  Administered 2021-08-23: 100 mg via ORAL
  Filled 2021-08-23: qty 1

## 2021-08-23 MED ORDER — DEXAMETHASONE SODIUM PHOSPHATE 10 MG/ML IJ SOLN
INTRAMUSCULAR | Status: DC | PRN
  Administered 2021-08-23: 10 mg via INTRAVENOUS

## 2021-08-23 MED ORDER — PROPOFOL 10 MG/ML IV BOLUS
INTRAVENOUS | Status: DC | PRN
  Administered 2021-08-23: 100 mg via INTRAVENOUS

## 2021-08-23 MED ORDER — ARTIFICIAL TEARS OPHTHALMIC OINT
TOPICAL_OINTMENT | OPHTHALMIC | Status: AC
  Filled 2021-08-23: qty 3.5

## 2021-08-23 MED ORDER — DOCUSATE SODIUM 100 MG PO CAPS: 100.0000 mg | ORAL_CAPSULE | Freq: Two times a day (BID) | ORAL | Status: DC

## 2021-08-23 MED ORDER — DEXAMETHASONE SODIUM PHOSPHATE 10 MG/ML IJ SOLN
INTRAMUSCULAR | Status: AC
  Filled 2021-08-23: qty 1

## 2021-08-23 MED ORDER — LIDOCAINE-EPINEPHRINE 1 %-1:100000 IJ SOLN
INTRAMUSCULAR | Status: DC | PRN
  Administered 2021-08-23: 5 mL

## 2021-08-23 MED ORDER — SODIUM CHLORIDE 0.9% FLUSH: 3.0000 mL | INTRAVENOUS | Status: DC | PRN

## 2021-08-23 MED ORDER — PANTOPRAZOLE SODIUM 40 MG PO TBEC
40.0000 mg | DELAYED_RELEASE_TABLET | Freq: Every day | ORAL | Status: DC
  Administered 2021-08-23: 40 mg via ORAL
  Filled 2021-08-23: qty 1

## 2021-08-23 MED ORDER — EPHEDRINE SULFATE-NACL 50-0.9 MG/10ML-% IV SOSY
PREFILLED_SYRINGE | INTRAVENOUS | Status: DC | PRN
  Administered 2021-08-23: 10 mg via INTRAVENOUS

## 2021-08-23 MED ORDER — ONDANSETRON HCL 4 MG PO TABS: 4.0000 mg | ORAL_TABLET | Freq: Four times a day (QID) | ORAL | Status: DC | PRN

## 2021-08-23 MED ORDER — POLYETHYLENE GLYCOL 3350 17 G PO PACK: 17.0000 g | PACK | Freq: Every day | ORAL | Status: DC | PRN

## 2021-08-23 MED ORDER — B COMPLEX-C PO TABS
1.0000 | ORAL_TABLET | Freq: Every day | ORAL | Status: DC
  Filled 2021-08-23: qty 1

## 2021-08-23 MED ORDER — ALUM & MAG HYDROXIDE-SIMETH 200-200-20 MG/5ML PO SUSP: 30.0000 mL | Freq: Four times a day (QID) | ORAL | Status: DC | PRN

## 2021-08-23 MED ORDER — PANTOPRAZOLE SODIUM 40 MG IV SOLR: 40.0000 mg | Freq: Every day | INTRAVENOUS | Status: DC

## 2021-08-23 MED ORDER — CEFAZOLIN SODIUM-DEXTROSE 2-4 GM/100ML-% IV SOLN
2.0000 g | Freq: Three times a day (TID) | INTRAVENOUS | Status: AC
  Administered 2021-08-23 – 2021-08-24 (×2): 2 g via INTRAVENOUS
  Filled 2021-08-23 (×2): qty 100

## 2021-08-23 MED ORDER — CHLORHEXIDINE GLUCONATE 0.12 % MT SOLN
15.0000 mL | Freq: Once | OROMUCOSAL | Status: AC
  Administered 2021-08-23: 15 mL via OROMUCOSAL
  Filled 2021-08-23: qty 15

## 2021-08-23 MED ORDER — LEVOTHYROXINE SODIUM 75 MCG PO TABS
75.0000 ug | ORAL_TABLET | Freq: Every day | ORAL | Status: DC
  Administered 2021-08-24: 75 ug via ORAL
  Filled 2021-08-23: qty 1

## 2021-08-23 MED ORDER — ROCURONIUM BROMIDE 10 MG/ML (PF) SYRINGE
PREFILLED_SYRINGE | INTRAVENOUS | Status: DC | PRN
  Administered 2021-08-23: 40 mg via INTRAVENOUS

## 2021-08-23 MED ORDER — THROMBIN 5000 UNITS EX SOLR
CUTANEOUS | Status: AC
  Filled 2021-08-23: qty 5000

## 2021-08-23 MED ORDER — KCL IN DEXTROSE-NACL 20-5-0.45 MEQ/L-%-% IV SOLN: INTRAVENOUS | Status: DC

## 2021-08-23 MED ORDER — CEFAZOLIN SODIUM-DEXTROSE 2-4 GM/100ML-% IV SOLN
2.0000 g | INTRAVENOUS | Status: AC
  Administered 2021-08-23: 2 g via INTRAVENOUS
  Filled 2021-08-23: qty 100

## 2021-08-23 MED ORDER — LIDOCAINE-EPINEPHRINE 1 %-1:100000 IJ SOLN
INTRAMUSCULAR | Status: AC
  Filled 2021-08-23: qty 1

## 2021-08-23 MED ORDER — ESCITALOPRAM OXALATE 5 MG PO TABS
5.0000 mg | ORAL_TABLET | Freq: Every day | ORAL | Status: DC
  Filled 2021-08-23: qty 1

## 2021-08-23 MED ORDER — CHLORHEXIDINE GLUCONATE CLOTH 2 % EX PADS: 6.0000 | MEDICATED_PAD | Freq: Once | CUTANEOUS | Status: DC

## 2021-08-23 MED ORDER — ONDANSETRON HCL 4 MG/2ML IJ SOLN: 4.0000 mg | Freq: Four times a day (QID) | INTRAMUSCULAR | Status: DC | PRN

## 2021-08-23 MED ORDER — KETOROLAC TROMETHAMINE 15 MG/ML IJ SOLN
7.5000 mg | Freq: Four times a day (QID) | INTRAMUSCULAR | Status: DC
  Administered 2021-08-23 – 2021-08-24 (×3): 7.5 mg via INTRAVENOUS
  Filled 2021-08-23 (×3): qty 1

## 2021-08-23 MED ORDER — OXYCODONE HCL 5 MG PO TABS: 5.0000 mg | ORAL_TABLET | ORAL | Status: DC | PRN

## 2021-08-23 MED ORDER — ORAL CARE MOUTH RINSE: 15.0000 mL | Freq: Once | OROMUCOSAL | Status: AC

## 2021-08-23 MED ORDER — LIDOCAINE 2% (20 MG/ML) 5 ML SYRINGE
INTRAMUSCULAR | Status: DC | PRN
  Administered 2021-08-23: 60 mg via INTRAVENOUS

## 2021-08-23 MED ORDER — NITROGLYCERIN 0.4 MG SL SUBL: 0.4000 mg | SUBLINGUAL_TABLET | SUBLINGUAL | Status: DC | PRN

## 2021-08-23 MED ORDER — ADULT MULTIVITAMIN W/MINERALS CH: 1.0000 | ORAL_TABLET | Freq: Every day | ORAL | Status: DC

## 2021-08-23 MED ORDER — BACITRACIN ZINC 500 UNIT/GM EX OINT
TOPICAL_OINTMENT | CUTANEOUS | Status: AC
  Filled 2021-08-23: qty 28.35

## 2021-08-23 MED ORDER — FLEET ENEMA 7-19 GM/118ML RE ENEM: 1.0000 | ENEMA | Freq: Once | RECTAL | Status: DC | PRN

## 2021-08-23 MED ORDER — BACITRACIN ZINC 500 UNIT/GM EX OINT
TOPICAL_OINTMENT | CUTANEOUS | Status: DC | PRN
  Administered 2021-08-23: 1 via TOPICAL

## 2021-08-23 MED ORDER — ONDANSETRON HCL 4 MG/2ML IJ SOLN
INTRAMUSCULAR | Status: AC
  Filled 2021-08-23: qty 2

## 2021-08-23 MED ORDER — LISINOPRIL 2.5 MG PO TABS
2.5000 mg | ORAL_TABLET | Freq: Two times a day (BID) | ORAL | Status: DC
  Administered 2021-08-23: 2.5 mg via ORAL
  Filled 2021-08-23 (×2): qty 1

## 2021-08-23 MED ORDER — EPHEDRINE 5 MG/ML INJ
INTRAVENOUS | Status: AC
  Filled 2021-08-23: qty 5

## 2021-08-23 MED ORDER — ACETAMINOPHEN 650 MG RE SUPP: 650.0000 mg | RECTAL | Status: DC | PRN

## 2021-08-23 MED ORDER — ONDANSETRON HCL 4 MG/2ML IJ SOLN
INTRAMUSCULAR | Status: DC | PRN
  Administered 2021-08-23: 4 mg via INTRAVENOUS

## 2021-08-23 MED ORDER — 0.9 % SODIUM CHLORIDE (POUR BTL) OPTIME
TOPICAL | Status: DC | PRN
  Administered 2021-08-23: 1000 mL

## 2021-08-23 MED ORDER — ACETAMINOPHEN 325 MG PO TABS: 650.0000 mg | ORAL_TABLET | ORAL | Status: DC | PRN

## 2021-08-23 MED ORDER — SODIUM CHLORIDE 0.9 % IV SOLN: 250.0000 mL | INTRAVENOUS | Status: DC

## 2021-08-23 MED ORDER — THROMBIN 20000 UNITS EX SOLR
CUTANEOUS | Status: AC
  Filled 2021-08-23: qty 20000

## 2021-08-23 MED ORDER — FENTANYL CITRATE (PF) 100 MCG/2ML IJ SOLN: 25.0000 ug | INTRAMUSCULAR | Status: DC | PRN

## 2021-08-23 MED ORDER — ROCURONIUM BROMIDE 10 MG/ML (PF) SYRINGE
PREFILLED_SYRINGE | INTRAVENOUS | Status: AC
  Filled 2021-08-23: qty 10

## 2021-08-23 MED ORDER — BUPIVACAINE-EPINEPHRINE (PF) 0.25% -1:200000 IJ SOLN
INTRAMUSCULAR | Status: AC
  Filled 2021-08-23: qty 30

## 2021-08-23 MED ORDER — VANCOMYCIN HCL 1000 MG IV SOLR
INTRAVENOUS | Status: DC | PRN
  Administered 2021-08-23: 1000 mg

## 2021-08-23 MED ORDER — MENTHOL 3 MG MT LOZG: 1.0000 | LOZENGE | OROMUCOSAL | Status: DC | PRN

## 2021-08-23 MED ORDER — DONEPEZIL HCL 5 MG PO TABS
5.0000 mg | ORAL_TABLET | Freq: Every day | ORAL | Status: DC
  Administered 2021-08-23: 5 mg via ORAL
  Filled 2021-08-23: qty 1

## 2021-08-23 MED ORDER — FENOFIBRATE 160 MG PO TABS: 160.0000 mg | ORAL_TABLET | Freq: Every day | ORAL | Status: DC

## 2021-08-23 MED ORDER — BISACODYL 10 MG RE SUPP: 10.0000 mg | Freq: Every day | RECTAL | Status: DC | PRN

## 2021-08-23 MED ORDER — AMIODARONE HCL 100 MG PO TABS
50.0000 mg | ORAL_TABLET | Freq: Every day | ORAL | Status: DC
  Filled 2021-08-23: qty 1

## 2021-08-23 MED ORDER — PHENOL 1.4 % MT LIQD: 1.0000 | OROMUCOSAL | Status: DC | PRN

## 2021-08-23 MED ORDER — ASPIRIN EC 81 MG PO TBEC: 81.0000 mg | DELAYED_RELEASE_TABLET | Freq: Every day | ORAL | Status: DC

## 2021-08-23 MED ORDER — PROPOFOL 10 MG/ML IV BOLUS
INTRAVENOUS | Status: AC
  Filled 2021-08-23: qty 20

## 2021-08-23 MED ORDER — CARVEDILOL 6.25 MG PO TABS
6.2500 mg | ORAL_TABLET | Freq: Two times a day (BID) | ORAL | Status: DC
  Administered 2021-08-23 – 2021-08-24 (×2): 6.25 mg via ORAL
  Filled 2021-08-23 (×3): qty 1

## 2021-08-23 MED ORDER — HYDROCODONE-ACETAMINOPHEN 5-325 MG PO TABS: 2.0000 | ORAL_TABLET | ORAL | Status: DC | PRN

## 2021-08-23 MED ORDER — VANCOMYCIN HCL 1000 MG IV SOLR
INTRAVENOUS | Status: AC
  Filled 2021-08-23: qty 20

## 2021-08-23 MED ORDER — MEMANTINE HCL 10 MG PO TABS
10.0000 mg | ORAL_TABLET | Freq: Two times a day (BID) | ORAL | Status: DC
  Administered 2021-08-23: 10 mg via ORAL
  Filled 2021-08-23 (×2): qty 1

## 2021-08-23 MED ORDER — LIDOCAINE 2% (20 MG/ML) 5 ML SYRINGE
INTRAMUSCULAR | Status: AC
  Filled 2021-08-23: qty 5

## 2021-08-23 SURGICAL SUPPLY — 66 items
BAG COUNTER SPONGE SURGICOUNT (BAG) ×2 IMPLANT
BAG SPNG CNTER NS LX DISP (BAG) ×1
BASKET BONE COLLECTION (BASKET) IMPLANT
BIT DRILL WIRE PASS 1.3MM (BIT) IMPLANT
BNDG CMPR 75X41 PLY HI ABS (GAUZE/BANDAGES/DRESSINGS)
BNDG GAUZE ELAST 4 BULKY (GAUZE/BANDAGES/DRESSINGS) IMPLANT
BNDG STRETCH 4X75 STRL LF (GAUZE/BANDAGES/DRESSINGS) IMPLANT
BUR ACORN 6.0 PRECISION (BURR) IMPLANT
BUR SPIRAL ROUTER 2.3 (BUR) IMPLANT
CANISTER SUCT 3000ML PPV (MISCELLANEOUS) ×2 IMPLANT
CARTRIDGE OIL MAESTRO DRILL (MISCELLANEOUS) IMPLANT
CATH ROBINSON RED A/P 12FR (CATHETERS) IMPLANT
CLIP VESOCCLUDE MED 6/CT (CLIP) IMPLANT
DECANTER SPIKE VIAL GLASS SM (MISCELLANEOUS) ×2 IMPLANT
DIFFUSER DRILL AIR PNEUMATIC (MISCELLANEOUS) ×2 IMPLANT
DRAIN PENROSE 1/2X12 LTX STRL (WOUND CARE) IMPLANT
DRAPE NEUROLOGICAL W/INCISE (DRAPES) ×2 IMPLANT
DRAPE WARM FLUID 44X44 (DRAPES) IMPLANT
DRILL WIRE PASS 1.3MM (BIT)
DRSG OPSITE 4X5.5 SM (GAUZE/BANDAGES/DRESSINGS) ×2 IMPLANT
DRSG PAD ABDOMINAL 8X10 ST (GAUZE/BANDAGES/DRESSINGS) IMPLANT
DRSG TELFA 3X8 NADH (GAUZE/BANDAGES/DRESSINGS) ×2 IMPLANT
DURAPREP 6ML APPLICATOR 50/CS (WOUND CARE) ×2 IMPLANT
ELECT REM PT RETURN 9FT ADLT (ELECTROSURGICAL) ×2
ELECTRODE REM PT RTRN 9FT ADLT (ELECTROSURGICAL) ×1 IMPLANT
EVACUATOR SILICONE 100CC (DRAIN) IMPLANT
GAUZE 4X4 16PLY ~~LOC~~+RFID DBL (SPONGE) ×2 IMPLANT
GAUZE SPONGE 4X4 12PLY STRL (GAUZE/BANDAGES/DRESSINGS) IMPLANT
GLOVE EXAM NITRILE XL STR (GLOVE) IMPLANT
GLOVE SRG 8 PF TXTR STRL LF DI (GLOVE) ×2 IMPLANT
GLOVE SURG ENC MOIS LTX SZ8 (GLOVE) ×2 IMPLANT
GLOVE SURG LTX SZ8 (GLOVE) ×2 IMPLANT
GLOVE SURG UNDER POLY LF SZ7.5 (GLOVE) ×2 IMPLANT
GLOVE SURG UNDER POLY LF SZ8 (GLOVE) ×4
GLOVE SURG UNDER POLY LF SZ8.5 (GLOVE) ×2 IMPLANT
GOWN STRL REUS W/ TWL LRG LVL3 (GOWN DISPOSABLE) IMPLANT
GOWN STRL REUS W/ TWL XL LVL3 (GOWN DISPOSABLE) ×2 IMPLANT
GOWN STRL REUS W/TWL 2XL LVL3 (GOWN DISPOSABLE) IMPLANT
GOWN STRL REUS W/TWL LRG LVL3 (GOWN DISPOSABLE)
GOWN STRL REUS W/TWL XL LVL3 (GOWN DISPOSABLE) ×4
HEMOSTAT SURGICEL 2X14 (HEMOSTASIS) IMPLANT
KIT BASIN OR (CUSTOM PROCEDURE TRAY) ×2 IMPLANT
KIT TURNOVER KIT B (KITS) ×2 IMPLANT
NEEDLE HYPO 25X1 1.5 SAFETY (NEEDLE) ×2 IMPLANT
NS IRRIG 1000ML POUR BTL (IV SOLUTION) ×2 IMPLANT
OIL CARTRIDGE MAESTRO DRILL (MISCELLANEOUS)
PACK CRANIOTOMY CUSTOM (CUSTOM PROCEDURE TRAY) ×2 IMPLANT
PAD ARMBOARD 7.5X6 YLW CONV (MISCELLANEOUS) ×2 IMPLANT
PATTIES SURGICAL .5 X.5 (GAUZE/BANDAGES/DRESSINGS) IMPLANT
PATTIES SURGICAL .5 X3 (DISPOSABLE) IMPLANT
PATTIES SURGICAL 1X1 (DISPOSABLE) IMPLANT
PIN MAYFIELD SKULL DISP (PIN) IMPLANT
SPECIMEN JAR SMALL (MISCELLANEOUS) ×2 IMPLANT
SPONGE NEURO XRAY DETECT 1X3 (DISPOSABLE) IMPLANT
SPONGE SURGIFOAM ABS GEL 100 (HEMOSTASIS) IMPLANT
STAPLER SKIN PROX WIDE 3.9 (STAPLE) ×2 IMPLANT
SUT ETHILON 3 0 PS 1 (SUTURE) IMPLANT
SUT NURALON 4 0 TR CR/8 (SUTURE) IMPLANT
SUT VIC AB 2-0 CP2 18 (SUTURE) ×2 IMPLANT
SUT VIC AB 3-0 SH 8-18 (SUTURE) IMPLANT
SYR CONTROL 10ML LL (SYRINGE) ×2 IMPLANT
TOWEL GREEN STERILE (TOWEL DISPOSABLE) ×2 IMPLANT
TOWEL GREEN STERILE FF (TOWEL DISPOSABLE) ×2 IMPLANT
TRAY FOLEY MTR SLVR 16FR STAT (SET/KITS/TRAYS/PACK) IMPLANT
UNDERPAD 30X36 HEAVY ABSORB (UNDERPADS AND DIAPERS) IMPLANT
WATER STERILE IRR 1000ML POUR (IV SOLUTION) ×2 IMPLANT

## 2021-08-23 NOTE — Interval H&P Note (Signed)
History and Physical Interval Note:  08/23/2021 10:04 AM  [name]  has presented today for surgery, with the diagnosis of Skull defect.  The various methods of treatment have been discussed with the patient and family. After consideration of risks, benefits and other options for treatment, the patient has consented to  Procedure(s) with comments: Left cranial wound revision, removal of plate, possible removal of bone flap (Left) - RM 20 as a surgical intervention.  The patient's history has been reviewed, patient examined, no change in status, stable for surgery.  I have reviewed the patient's chart and labs.  Questions were answered to the patient's satisfaction.     Peggyann Shoals

## 2021-08-23 NOTE — Anesthesia Procedure Notes (Signed)
Procedure Name: Intubation Date/Time: 08/23/2021 10:20 AM Performed by: Lance Coon, CRNA Pre-anesthesia Checklist: Emergency Drugs available, Patient identified, Suction available, Patient being monitored and Timeout performed Patient Re-evaluated:Patient Re-evaluated prior to induction Oxygen Delivery Method: Circle system utilized Preoxygenation: Pre-oxygenation with 100% oxygen Induction Type: IV induction Ventilation: Mask ventilation without difficulty Laryngoscope Size: Miller and 3 Grade View: Grade I Tube type: Oral Tube size: 7.5 mm Number of attempts: 1 Airway Equipment and Method: Stylet Placement Confirmation: ETT inserted through vocal cords under direct vision, positive ETCO2 and breath sounds checked- equal and bilateral Secured at: 21 cm Tube secured with: Tape Dental Injury: Teeth and Oropharynx as per pre-operative assessment

## 2021-08-23 NOTE — Op Note (Signed)
08/23/2021  11:12 AM  PATIENT:  [name]  86 y.o. male  PRE-OPERATIVE DIAGNOSIS:  Skull defect  POST-OPERATIVE DIAGNOSIS:  Skull defect  PROCEDURE:  Procedure(s): Left cranial wound revision, removal of plate anbd screw (Left)  SURGEON:  Surgeon(s) and Role:    Erline Levine, MD - Primary  PHYSICIAN ASSISTANT: Glenford Peers, NP  ASSISTANTS: none   ANESTHESIA:   general  EBL:  10 mL   BLOOD ADMINISTERED:none  DRAINS: none   LOCAL MEDICATIONS USED:  MARCAINE    and LIDOCAINE   SPECIMEN:  Source of Specimen:  Wound culture with removed hardware  DISPOSITION OF SPECIMEN:  Microbiology  COUNTS:  YES  TOURNIQUET:  * No tourniquets in log *  DICTATION: Patient is 86 year old man who had previous bilateral craniotomies for SDH 13 years ago.  One of the bone plates eroded through his scalp recently and it was elected to bring him to surgery to revise his cranial wound.  Procedure:  Following smooth intubation, patient was placed in right semi-lateral position with blanket roll.  Head was placed on donut head holder and left frontal scalp was shaved and prepped and draped in usual sterile fashion.  Area of planned incision was infiltrated with lidocaine. The previous linear incision was made and carried through temporalis fascia and muscle to expose the cranial plate.  This was removed.  There was a small amount of proud flesh where the plate had eroded through the scalp and this was removed with curets and sent to microbiology for culture.  The bone flap was well integrated into the skull and did not appear infected.  It was irrigated extensively with vancomycin irrigation.  The fascia and galea were closed with 2-0 vicryl sutures and the skin was re approximated with staples.  A sterile occlusive dressing was placed.  Patient was extubated and taken to recovery in stable condition having tolerated his operation well.    PLAN OF CARE: Admit to inpatient   PATIENT DISPOSITION:   PACU - hemodynamically stable.   Delay start of Pharmacological VTE agent (>24hrs) due to surgical blood loss or risk of bleeding: yes

## 2021-08-23 NOTE — Progress Notes (Signed)
Called and spoke to Gae Dry with [employer] to notify that patient has arrived to Short Stay Room 44 at [facility] and has orders to have pacemaker checked prior to surgery. Ronalee Belts verbalized understanding and stated he will arrive shortly to see the patient.

## 2021-08-23 NOTE — H&P (Signed)
Patient ID:   [id number] Patient: [name]  Date of Birth: 1935/08/07 Visit Type: Office Visit   Date: 08/07/2021 11:45 AM Provider: Marchia Meiers. Vertell Limber MD   This 86 year old male presents for Wound.  HISTORY OF PRESENT ILLNESS: 1.  Wound  [name], 86 year old male returns for evaluation of protruding cranial plate.  He denies pain or other symptoms, but reports noticing metal protruding from his scalp 3 weeks ago.  He underwent bilateral cranio for subdural in 2006. He was reportedly seen at the wound care center and referred here.  History:  CHF, CKD stage 4, AFib, hypothyroidism, depression Surgical history:  ICD 2001, 2008, hernia repair 1995, CABG 1993, mandible fracture 20 years ago, bilateral cranial for subdural 2006 (Dr. Vertell Limber)  The patient has developed erosion of his scalp on the left and a small cranial plate is now protruding through his skin.  This does not appear to be actively infected and the right-sided burr hole cover is observed through  thin scan but this skin is intact.  A head CT was obtained which demonstrates no evidence of recurrence of subdural and no complications are apparent.  I performed a craniotomy for subdural in 2006       PAST MEDICAL/SURGICAL HISTORY:   (Detailed)      PAST MEDICAL HISTORY, SURGICAL HISTORY, FAMILY HISTORY, SOCIAL HISTORY AND REVIEW OF SYSTEMS I have reviewed the patient's past medical, surgical, family and social history as well as the comprehensive review of systems as included on the [facility] history form dated 08/07/2021, which I have signed.  Family History:  (Detailed)   Social History:  (Detailed) Tobacco use reviewed. Preferred language is [place].   Tobacco use status: Current non-smoker. Smoking status: Never smoker.  SMOKING STATUS Type Smoking Status Usage Per Day Years Used Total Pack Years  Never smoker         MEDICATIONS: (added, continued or  stopped this visit) Started Medication Directions Instruction Stopped  amiodarone 100 mg tablet     carvedilol 6.25 mg tablet     donepezil 10 mg tablet     escitalopram 5 mg tablet     fenofibrate 160 mg tablet     levothyroxine 75 mcg capsule     lisinopril 5 mg tablet     memantine 10 mg tablet     spironolactone 25 mg tablet       ALLERGIES: Ingredient Reaction Medication Name Comment NO KNOWN ALLERGIES    No known allergies. Reviewed, updated.   REVIEW OF SYSTEMS  See scanned patient registration form, dated 08/07/2021, signed and dated on 08/07/2021  Review of Systems Details System Neg/Pos Details Constitutional Negative Chills, Fatigue, Fever, Malaise, Night sweats, Weight gain and Weight loss. ENMT Negative Ear drainage, Hearing loss, Nasal drainage, Otalgia, Sinus pressure and Sore throat. Eyes Negative Eye discharge, Eye pain and Vision changes. Respiratory Negative Chronic cough, Cough, Dyspnea, Known TB exposure and Wheezing. Cardio Negative Chest pain, Claudication, Edema and Irregular heartbeat/palpitations. GI Negative Abdominal pain, Blood in stool, Change in stool pattern, Constipation, Decreased appetite, Diarrhea, Heartburn, Nausea and Vomiting. GU Negative Dribbling, Dysuria, Erectile dysfunction, Hematuria, Polyuria (Genitourinary), Slow stream, Urinary frequency, Urinary incontinence and Urinary retention. Endocrine Negative Cold intolerance, Heat intolerance, Polydipsia and Polyphagia. Neuro Negative Dizziness, Extremity weakness, Gait disturbance, Headache, Memory impairment, Numbness in extremity, Seizures and Tremors. Psych Negative Anxiety, Depression and Insomnia. Integumentary Negative Brittle hair, Brittle nails, Change in shape/size of mole(s), Hair loss, Hirsutism, Hives, Pruritus, Rash  and Skin lesion. MS Negative Back pain, Joint pain, Joint swelling, Muscle weakness and  Neck pain. Hema/Lymph Negative Easy bleeding, Easy bruising and Lymphadenopathy. Allergic/Immuno Negative Contact allergy, Environmental allergies, Food allergies and Seasonal allergies. Reproductive Negative Penile discharge and Sexual dysfunction.  PHYSICAL EXAM:  Vitals Date Temp F BP Pulse Ht In Wt Lb BMI BSA Pain Score 08/07/2021  101/67 85 71 155 21.62  9/10   PHYSICAL EXAM Details General Level of Distress: no acute distress Overall Appearance: normal  Head and Face  Right Left  Fundoscopic Exam:  normal normal    Cardiovascular Cardiac: regular rate and rhythm without murmur  Right Left  Carotid Pulses: normal normal  Respiratory Lungs: clear to auscultation  Neurological Orientation: normal Recent and Remote Memory: normal Attention Span and Concentration:   normal Language: normal Fund of Knowledge: normal  Right Left Sensation: normal normal Upper Extremity Coordination: normal normal  Lower Extremity Coordination: normal normal  Musculoskeletal Gait and Station: normal  Right Left Upper Extremity Muscle Strength: normal normal Lower Extremity Muscle Strength: normal normal Upper Extremity Muscle Tone:  normal normal Lower Extremity Muscle Tone: normal normal   Motor Strength Upper and lower extremity motor strength was tested in the clinically pertinent muscles.     Deep Tendon Reflexes  Right Left Biceps: normal normal Triceps: normal normal Brachioradialis: normal normal Patellar: normal normal Achilles: normal normal  Cranial Nerves II. Optic Nerve/Visual Fields: normal III. Oculomotor: normal IV. Trochlear: normal V. Trigeminal: normal VI. Abducens: normal VII. Facial: normal VIII. Acoustic/Vestibular: normal IX. Glossopharyngeal: normal X. Vagus: normal XI. Spinal Accessory: normal XII. Hypoglossal: normal  Motor and other Tests Lhermittes: negative Rhomberg: negative Pronator  drift: absent     Right Left Hoffman's: normal normal Clonus: normal normal Babinski: normal normal   Additional Findings:   bone flap protruding through left side of scalp    IMPRESSION:  The patient has had wound breakdown on the left side of his scalp.  His cranial plate is protruding through his skin and I have recommended that this be repaired.  PLAN: Proceed with left cranial wound revision and removal of plate.  He may require removal of bone flap as well if there appears to be osteomyelitis.   Assessment/Plan  # Detail Type Description  1. Assessment Skull defect (M95.2).     2. Assessment S/P craniotomy (Z98.890).       Pain Management Plan Pain Scale: 9/10. Method: Numeric Pain Intensity Scale. Location: head. Onset: 08/07/2021. Duration: varies. Quality: discomforting. Pain management follow-up plan of care: Patient will continue medication management..              Provider:  Marchia Meiers. Vertell Limber MD  08/10/2021 02:42 PM    Dictation edited by: Marchia Meiers. Vertell Limber    CC Providers: [facility] [address] [place],  [place]  [id number]-   Abhijot Straughter MD  [address] [place], [place] [address]               Electronically signed by Marchia Meiers. Vertell Limber MD on 08/10/2021 02:42 PM

## 2021-08-23 NOTE — Brief Op Note (Signed)
08/23/2021  11:12 AM  PATIENT:  [name]  86 y.o. male  PRE-OPERATIVE DIAGNOSIS:  Skull defect  POST-OPERATIVE DIAGNOSIS:  Skull defect  PROCEDURE:  Procedure(s): Left cranial wound revision, removal of plate anbd screw (Left)  SURGEON:  Surgeon(s) and Role:    Erline Levine, MD - Primary  PHYSICIAN ASSISTANT: Glenford Peers, NP  ASSISTANTS: none   ANESTHESIA:   general  EBL:  10 mL   BLOOD ADMINISTERED:none  DRAINS: none   LOCAL MEDICATIONS USED:  MARCAINE    and LIDOCAINE   SPECIMEN:  Source of Specimen:  Wound culture with removed hardware  DISPOSITION OF SPECIMEN:   Microbiology  COUNTS:  YES  TOURNIQUET:  * No tourniquets in log *  DICTATION: Patient is 86 year old man who had previous bilateral craniotomies for SDH 13 years ago.  One of the bone plates eroded through his scalp recently and it was elected to bring him to surgery to revise his cranial wound.  Procedure:  Following smooth intubation, patient was placed in right semi-lateral position with blanket roll.  Head was placed on donut head holder and left frontal scalp was shaved and prepped and draped in usual sterile fashion.  Area of planned incision was infiltrated with lidocaine. The previous linear incision was made and carried through temporalis fascia and muscle to expose the cranial plate.  This was removed.  There was a small amount of proud flesh where the plate had eroded through the scalp and this was removed with curets and sent to microbiology for culture.  The bone flap was well integrated into the skull and did not appear infected.  It was irrigated extensively with vancomycin irrigation.  The fascia and galea were closed with 2-0 vicryl sutures and the skin was re approximated with staples.  A sterile occlusive dressing was placed.  Patient was extubated and taken to recovery in stable condition having tolerated his operation well.    PLAN OF CARE: Admit to inpatient   PATIENT DISPOSITION:   PACU - hemodynamically stable.   Delay start of Pharmacological VTE agent (>24hrs) due to surgical blood loss or risk of bleeding: yes

## 2021-08-23 NOTE — Anesthesia Postprocedure Evaluation (Signed)
Anesthesia Post Note  Patient: [name]  Procedure(s) Performed: Left cranial wound revision, removal of plate anbd screw (Left)     Patient location during evaluation: PACU Anesthesia Type: General Level of consciousness: awake and sedated Pain management: pain level controlled Vital Signs Assessment: post-procedure vital signs reviewed and stable Respiratory status: spontaneous breathing Cardiovascular status: stable Postop Assessment: no apparent nausea or vomiting Anesthetic complications: no   No notable events documented.  Last Vitals:  Vitals:   08/23/21 1135 08/23/21 1150  BP: 117/64 118/63  Pulse: (!) 59 (!) 59  Resp: 18 12  Temp:  36.4 C  SpO2: 98% 97%    Last Pain:  Vitals:   08/23/21 1150  TempSrc:   PainSc: 0-No pain                 Huston Foley

## 2021-08-23 NOTE — Evaluation (Signed)
Physical Therapy Evaluation Patient Details Name: QUINTERRIUS ERRINGTON MRN: [id number] DOB: 1935-10-17 Today's Date: 08/23/2021   History of Present Illness  86 yo male s/p L cranial wound revision, removal of plate and screw on 0/25. PMH includes CHF, CKD stage 4, AFib, hypothyroidism, depression, ICD 2001 and 2008, CABG, bilat crani for SDH 2006.  Clinical Impression   Pt presents with impaired balance, increased time and effort to mobilize vs baseline, and decreased activity tolerance. Pt to benefit from acute PT to address deficits. Pt ambulated short hallway distance with use of RW and close guard for safety, pt's baseline is mobilizing without AD but pt too unsteady to walk without it today. PT to progress mobility as tolerated, and will continue to follow acutely.      Follow Up Recommendations Home health PT;Supervision for mobility/OOB    Equipment Recommendations  Rolling walker with 5" wheels    Recommendations for Other Services       Precautions / Restrictions Precautions Precautions: Fall Precaution Comments: L-sided skull incision Restrictions Weight Bearing Restrictions: No      Mobility  Bed Mobility Overal bed mobility: Needs Assistance Bed Mobility: Supine to Sit;Sit to Supine     Supine to sit: Supervision;HOB elevated Sit to supine: Supervision;HOB elevated   General bed mobility comments: increased time, use of bedrails.    Transfers Overall transfer level: Needs assistance Equipment used: None Transfers: Sit to/from Stand Sit to Stand: Supervision         General transfer comment: for safety, slow to rise.  Ambulation/Gait Ambulation/Gait assistance: Min guard Gait Distance (Feet): 80 Feet Assistive device: Rolling walker (2 wheeled) Gait Pattern/deviations: Step-through pattern;Decreased stride length;Trunk flexed Gait velocity: decr   General Gait Details: close guard for safety, verbal cuing for placement in RW, upright posture. Pt  started gait without AD, but reaching for environment and unsteady.  Stairs            Wheelchair Mobility    Modified Rankin (Stroke Patients Only)       Balance Overall balance assessment: Needs assistance Sitting-balance support: No upper extremity supported;Feet supported Sitting balance-Leahy Scale: Fair     Standing balance support: Bilateral upper extremity supported;During functional activity Standing balance-Leahy Scale: Poor Standing balance comment: reliant on external support dynamically                             Pertinent Vitals/Pain Pain Assessment: No/denies pain    Home Living Family/patient expects to be discharged to:: Private residence Living Arrangements: Spouse/significant other Available Help at Discharge: Family;Available 24 hours/day Type of Home: House Home Access: Stairs to enter Entrance Stairs-Rails: Left;Right;Can reach both Entrance Stairs-Number of Steps: a few Home Layout: Two level;Bed/bath upstairs Home Equipment: None      Prior Function Level of Independence: Independent               Hand Dominance   Dominant Hand: Right    Extremity/Trunk Assessment   Upper Extremity Assessment Upper Extremity Assessment: Defer to OT evaluation    Lower Extremity Assessment Lower Extremity Assessment: RLE deficits/detail RLE Deficits / Details: 4/5 throughout, symmetric to L. R foot deformity along medial arch from breaking foot decades ago, lacks medial arch    Cervical / Trunk Assessment Cervical / Trunk Assessment: Normal  Communication   Communication: No difficulties  Cognition Arousal/Alertness: Awake/alert Behavior During Therapy: WFL for tasks assessed/performed Overall Cognitive Status: Within Functional Limits for tasks assessed  General Comments: Pt appears to have some memory deficits, for instance pt could not recall if he had used a RW for gait  earlier or not when he had. unsure of pt baseline in this regard.      General Comments      Exercises     Assessment/Plan    PT Assessment Patient needs continued PT services  PT Problem List Decreased mobility;Decreased safety awareness;Decreased activity tolerance;Decreased balance;Decreased knowledge of use of DME;Pain;Decreased strength       PT Treatment Interventions DME instruction;Therapeutic activities;Gait training;Therapeutic exercise;Patient/family education;Balance training;Stair training;Functional mobility training;Neuromuscular re-education    PT Goals (Current goals can be found in the Care Plan section)  Acute Rehab PT Goals Patient Stated Goal: home tomorrow PT Goal Formulation: With patient Time For Goal Achievement: 08/30/21 Potential to Achieve Goals: Good    Frequency Min 5X/week   Barriers to discharge        Co-evaluation               AM-PAC PT "6 Clicks" Mobility  Outcome Measure Help needed turning from your back to your side while in a flat bed without using bedrails?: A Little Help needed moving from lying on your back to sitting on the side of a flat bed without using bedrails?: A Little Help needed moving to and from a bed to a chair (including a wheelchair)?: A Little Help needed standing up from a chair using your arms (e.g., wheelchair or bedside chair)?: A Little Help needed to walk in hospital room?: A Little Help needed climbing 3-5 steps with a railing? : A Little 6 Click Score: 18    End of Session   Activity Tolerance: Patient tolerated treatment well Patient left: in bed;with call bell/phone within reach;with family/visitor present;Other (comment) (encouraged pt to call and wait for assist prior to mobilizing given high fall risk, pt and daughter agree. 3C does not use alarm system for bed/chairs) Nurse Communication: Mobility status PT Visit Diagnosis: Other abnormalities of gait and mobility (R26.89);Muscle weakness  (generalized) (M62.81)    Time: 9292-4462 PT Time Calculation (min) (ACUTE ONLY): 21 min   Charges:   PT Evaluation $PT Eval Low Complexity: 1 Low          Charity Tessier S, PT DPT Acute Rehabilitation Services Pager [phone]  Office [phone]   Roxine Caddy E Ruffin Pyo 08/23/2021, 4:40 PM

## 2021-08-23 NOTE — Transfer of Care (Signed)
Immediate Anesthesia Transfer of Care Note  Patient: [name]  Procedure(s) Performed: Left cranial wound revision, removal of plate anbd screw (Left)  Patient Location: PACU  Anesthesia Type:General  Level of Consciousness: drowsy and patient cooperative  Airway & Oxygen Therapy: Patient Spontanous Breathing  Post-op Assessment: Report given to RN and Post -op Vital signs reviewed and stable  Post vital signs: Reviewed and stable  Last Vitals:  Vitals Value Taken Time  BP 129/70 08/23/21 1105  Temp    Pulse 62 08/23/21 1106  Resp 6 08/23/21 1105  SpO2 100 % 08/23/21 1106  Vitals shown include unvalidated device data.  Last Pain:  Vitals:   08/23/21 0821  TempSrc:   PainSc: 0-No pain      Patients Stated Pain Goal: 2 (38/88/75 7972)  Complications: No notable events documented.

## 2021-08-24 ENCOUNTER — Encounter (HOSPITAL_COMMUNITY): Payer: Self-pay | Admitting: Neurosurgery

## 2021-08-24 MED ORDER — HYDROCODONE-ACETAMINOPHEN 5-325 MG PO TABS: 1.0000 | ORAL_TABLET | Freq: Four times a day (QID) | ORAL | 0 refills | Status: AC | PRN

## 2021-08-24 NOTE — Plan of Care (Signed)
Adequately Ready for Discharge 

## 2021-08-24 NOTE — Progress Notes (Signed)
Neurosurgery Service Progress Note  Subjective: No acute events overnight, mild headache, no N/V, no complaints   Objective: Vitals:   08/23/21 2049 08/23/21 2325 08/24/21 0407 08/24/21 0749  BP: (!) 93/50 (!) 102/49 116/64 (!) 103/55  Pulse: 65 (!) 59 60 63  Resp: 18 18 18 16   Temp: 98.5 F (36.9 C) 98.2 F (36.8 C) 97.8 F (36.6 C) 97.8 F (36.6 C)  TempSrc: Oral Oral Oral Oral  SpO2: 93% 94% 96% 96%  Weight:      Height:        Physical Exam: AOx3, gaze conjugate w/o diplopia, FS, Strength 5/5 x4, SILTx4, incision w/ dressing c/di  Assessment & Plan: 86 y.o. man s/p removal of exposed burr hole cover, recovering well.  -discharge home today  Judith Part  08/24/21 8:45 AM

## 2021-08-24 NOTE — Progress Notes (Signed)
OT Cancellation Note  Patient Details Name: [name] MRN: [id number] DOB: 1935-03-26   Cancelled Treatment:    Reason Eval/Treat Not Completed: OT screened, no needs identified, will sign off.  Nilsa Nutting., OTR/L Acute Rehabilitation Services Pager [phone] Office [phone]   Lucille Passy M 08/24/2021, 8:48 AM

## 2021-08-24 NOTE — Progress Notes (Signed)
Physical Therapy Treatment Patient Details Name: [name] MRN: [id number] DOB: 12-May-1935 Today's Date: 08/24/2021    History of Present Illness 86 yo male s/p L cranial wound revision, removal of plate and screw on 1/91. PMH includes CHF, CKD stage 4, AFib, hypothyroidism, depression, ICD 2001 and 2008, CABG, bilat crani for SDH 2006.    PT Comments    Mobility is much improved from yesterday. I have encouraged the patient to gradually increase activity daily to tolerance.   I have discussed with pt and son to have assist at first when walking and use cane, especially outside of home.  Pt acknowledges he's not quite back to baseline yet.  Advised him to limit activity at first until back to baseline.      Follow Up Recommendations  No PT follow up;Supervision for mobility/OOB     Equipment Recommendations  None recommended by PT    Recommendations for Other Services       Precautions / Restrictions Precautions Precautions: Fall Precaution Comments: L-sided skull incision Restrictions Weight Bearing Restrictions: Yes    Mobility  Bed Mobility Overal bed mobility: Modified Independent                  Transfers Overall transfer level: Needs assistance Equipment used: None Transfers: Sit to/from Stand Sit to Stand: Supervision         General transfer comment: For safety  Ambulation/Gait Ambulation/Gait assistance: Supervision Gait Distance (Feet): 180 Feet Assistive device: None Gait Pattern/deviations: Ataxic (slightly ataxic) Gait velocity: decreased   General Gait Details: much steadier than yesterday per PT note.   Stairs Stairs: Yes Stairs assistance: Min assist Stair Management: One rail Right;Step to pattern;Forwards Number of Stairs: 10 General stair comments: one slight babalnce loss that PT helped correct when descending stairs   Wheelchair Mobility    Modified Rankin (Stroke Patients Only)       Balance Overall balance  assessment: Needs assistance Sitting-balance support: No upper extremity supported;Feet supported Sitting balance-Leahy Scale: Good     Standing balance support: No upper extremity supported Standing balance-Leahy Scale: Fair                 High Level Balance Comments: Pt put on pants while standing but used wall to help steady himself. Advised to sit while putting pants on at home            Cognition Arousal/Alertness: Awake/alert Behavior During Therapy: [facility] for tasks assessed/performed Overall Cognitive Status: Within Functional Limits for tasks assessed                                 General Comments: Appears to have some baseline memory issues.      Exercises      General Comments General comments (skin integrity, edema, etc.): Pts son present for treatment.  Pt ready to discharge home.      Pertinent Vitals/Pain Pain Assessment: No/denies pain    Home Living                      Prior Function            PT Goals (current goals can now be found in the care plan section) Progress towards PT goals: Progressing toward goals    Frequency    Min 5X/week      PT Plan Current plan remains appropriate    Co-evaluation  AM-PAC PT "6 Clicks" Mobility   Outcome Measure  Help needed turning from your back to your side while in a flat bed without using bedrails?: None Help needed moving from lying on your back to sitting on the side of a flat bed without using bedrails?: None Help needed moving to and from a bed to a chair (including a wheelchair)?: A Little Help needed standing up from a chair using your arms (e.g., wheelchair or bedside chair)?: A Little Help needed to walk in hospital room?: A Little Help needed climbing 3-5 steps with a railing? : A Little 6 Click Score: 20    End of Session Equipment Utilized During Treatment: Gait belt Activity Tolerance: Patient tolerated treatment well Patient  left: in bed;with call bell/phone within reach;with family/visitor present (Son present) Nurse Communication: Mobility status;Other (comment) (Pt/son state they are ready for DC) PT Visit Diagnosis: Other abnormalities of gait and mobility (R26.89);Muscle weakness (generalized) (M62.81)     Time: 2197-5883 PT Time Calculation (min) (ACUTE ONLY): 18 min  Charges:  $Gait Training: 8-22 mins                     Lavonia Dana, PT   Acute Rehabilitation Services  Pager [phone] Office [phone] 08/24/2021    Melvern Banker 08/24/2021, 9:04 AM

## 2021-08-24 NOTE — Discharge Summary (Signed)
Discharge Summary  Date of Admission: 08/23/2021  Date of Discharge: 08/24/21  Attending Physician: Erline Levine, MD  Hospital Course: Patient was admitted following an uncomplicated removal of an exposed cranial plate. He was recovered in PACU and transferred to [facility]. His hospital course was uncomplicated and the patient was discharged home on 08/24/21. He will follow up in clinic with Dr. Vertell Limber in 2 weeks.  Neurologic exam at discharge:  Strength 5/5 x4, SILTx4  Discharge diagnosis: Exposed cranial hardware  Judith Part, MD 08/24/21 8:48 AM

## 2021-08-24 NOTE — Progress Notes (Signed)
Patient alert and oriented, voiding adequately, MAE well with no difficulty. Incision area cdi with no s/s of infection. Patient discharged home per order. Patient and son stated understanding of discharge instructions given. Patient will take home medications at home per son. Patient has an appointment with Dr. Vertell Limber in 2 weeks.

## 2021-08-26 ENCOUNTER — Telehealth: Payer: Self-pay | Admitting: Internal Medicine

## 2021-08-26 ENCOUNTER — Encounter (HOSPITAL_COMMUNITY): Payer: Self-pay | Admitting: Neurosurgery

## 2021-08-26 NOTE — Telephone Encounter (Signed)
Patient's daughter says the patient had surgery Friday and had his device checked at the hospital. She says they said he has 10 months battery life left. She would like to know if he still needs to have his appointment 9/1.

## 2021-08-26 NOTE — Telephone Encounter (Signed)
Malachy Mood is not listed on the DPR, called patient # on record, spoke with wife [name] who is on [place].  Advised appt on Thursday 9/1 with Oda Kilts is still needed as patient is nearing ERI. [name] indicated she will relay information to [place] and plan to update DPR when they come on Thursday.

## 2021-08-28 LAB — AEROBIC/ANAEROBIC CULTURE W GRAM STAIN (SURGICAL/DEEP WOUND)

## 2021-08-28 NOTE — Progress Notes (Signed)
Electrophysiology Office Note Date: 08/29/2021  ID:  Khasir, [place] [birth year], MRN [id number]  PCP: Burnard Bunting, MD Primary Cardiologist: Mertie Moores, MD Electrophysiologist: Virl Axe, MD   CC: Routine ICD follow-up  [name] is a 86 y.o. male seen today for Virl Axe, MD for routine electrophysiology followup.  Since last being seen in our clinic the patient reports doing well overall.    He had left cranial wound revision 08/23/2021. He is doing well and I previously confirmed with his wife he was up to making his appointment today.  He is still fairly active and walks weather permitting.Denies lightheadedness despite soft pressure today.   Device History: Medtronic Dual Chamber ICD implanted 1999(Pt sone reports 1995), RV lead revision 2001, gen change 2014 for chronic systolic CHF  Past Medical History:  Diagnosis Date   Atrial arrhythmia/a fibrillation    not on coumadin 2/2 subdural hematoma   Automatic implantable cardiac defibrillator in situ    medtronic   Automatic implantable cardioverter-defibrillator in situ    CHF (congestive heart failure) (HCC)    Chronic systolic congestive heart failure ([place])    Complication of anesthesia    per daughter "took a little bit coming out of it last time"   Coronary artery disease    Dementia ([place])    Depression    Dysrhythmia    Encounter for long-term (current) use of other medications    amiodarone   H/O hiatal hernia    Hyperlipidemia    Hypertension    Hypothyroidism    Ischemic cardiomyopathy    status post CABG with patent vein grafts and an atretic LIMA to his ramus, January 2012   Myocardial infarction [facility])    1995, non-STEMI January 2012   Pacemaker    Pneumonia    Subdural hematoma ([place])    on coumadin   Thyroid disease    hypothyroid   Ventricular fibrillation ([place])    Ventricular tachycardia ([place])     VT [place]-January 2012- responded to Amiodarone   Past Surgical History:   Procedure Laterality Date   bilateral cranitomies for sub hematomas     BYPASS GRAFT  12/29/1993   5   CHOLECYSTECTOMY N/A 12/26/2017   Procedure: LAPAROSCOPIC CHOLECYSTECTOMY;  Surgeon: Ileana Roup, MD;  Location: [place];  Service: General;  Laterality: N/A;   CORONARY ARTERY BYPASS GRAFT  12/29/1993   5   CRANIOTOMY Left 08/23/2021   Procedure: Left cranial wound revision, removal of plate anbd screw;  Surgeon: Erline Levine, MD;  Location: [place];  Service: Neurosurgery;  Laterality: Left;   IMPLANTABLE CARDIOVERTER DEFIBRILLATOR GENERATOR CHANGE N/A 03/28/2013   Procedure: IMPLANTABLE CARDIOVERTER DEFIBRILLATOR GENERATOR CHANGE;  Surgeon: Deboraha Sprang, MD;  [facility] C[facility] LAB;  Service: Cardiovascular;  Laterality: N/A;   INGUINAL HERNIA REPAIR Left 05/15/2014   Procedure: HERNIA REPAIR INGUINAL ADULT;  Surgeon: Zenovia Jarred, MD;  Location: [place];  Service: General;  Laterality: Left;   INSERT / REPLACE / REMOVE PACEMAKER  12/29/2012    generator repl    INSERTION OF MESH Left 05/15/2014   Procedure: INSERTION OF MESH;  Surgeon: Zenovia Jarred, MD;  Location: [place];  Service: General;  Laterality: Left;   IR GUIDED [place]  03/25/2018   IR RADIOLOGIST EVAL & MGMT  04/07/2018   MANDIBLE SURGERY     TONSILLECTOMY      Current Outpatient Medications  Medication Sig Dispense Refill   amiodarone (PACERONE)  100 MG tablet Take 0.5 tablets (50 mg total) by mouth daily. 45 tablet 0   aspirin EC 81 MG tablet Take 81 mg by mouth daily.     b complex vitamins tablet Take 1 tablet by mouth daily.     carvedilol (COREG) 6.25 MG tablet Take 1 tablet (6.25 mg total) by mouth 2 (two) times daily with a meal. 180 tablet 0   Coenzyme Q10 (COQ10 PO) Take 100 mg by mouth daily.     Cyanocobalamin (VITAMIN B-12 IJ) Inject as directed every 30 (thirty) days.     donepezil (ARICEPT) 5 MG tablet Take 5 mg by mouth at bedtime.      escitalopram (LEXAPRO) 5 MG tablet  Take 5 mg by mouth daily.  6   fenofibrate 160 MG tablet Take 1 tablet (160 mg total) by mouth daily. 90 tablet 0   levothyroxine (SYNTHROID, LEVOTHROID) 75 MCG tablet Take 1 tablet (75 mcg total) by mouth daily before breakfast.  3   lisinopril (ZESTRIL) 5 MG tablet Take 1 tablet (5 mg total) by mouth daily. (Patient taking differently: Take 2.5 mg by mouth 2 (two) times daily.) 90 tablet 0   memantine (NAMENDA) 10 MG tablet Take 1 tablet (10 mg total) by mouth 2 (two) times daily. 180 tablet 3   Multiple Vitamin (MULTIVITAMIN WITH MINERALS) TABS tablet Take 1 tablet by mouth daily.     nitroGLYCERIN (NITROSTAT) 0.4 MG SL tablet Place 0.4 mg under the tongue every 5 (five) minutes as needed for chest pain.     spironolactone (ALDACTONE) 25 MG tablet Take 0.5 tablets (12.5 mg total) by mouth daily. 45 tablet 0   HYDROcodone-acetaminophen (NORCO/VICODIN) 5-325 MG tablet Take 1 tablet by mouth every 6 (six) hours as needed (pain). (Patient not taking: Reported on 08/29/2021) 30 tablet 0   traMADol (ULTRAM) 50 MG tablet Take 50 mg by mouth every 6 (six) hours as needed.     No current facility-administered medications for this visit.    Allergies:   Prednisone   Social History: Social History   Socioeconomic History   Marital status: Married    Spouse name: Not on file   Number of children: Not on file   Years of education: Not on file   Highest education level: Not on file  Occupational History   Not on file  Tobacco Use   Smoking status: Never   Smokeless tobacco: Former  Scientific laboratory technician Use: Never used  Substance and Sexual Activity   Alcohol use: No   Drug use: No   Sexual activity: Not on file  Other Topics Concern   Not on file  Social History Narrative   Pt lives in 2 story home with his wife   Has 2 adult children   High school education   Retired Tour manager    Right Handed   Drinks coffee in the morning      Social Determinants of Health   Financial  Resource Strain: Not on file  Food Insecurity: Not on file  Transportation Needs: Not on file  Physical Activity: Not on file  Stress: Not on file  Social Connections: Not on file  Intimate Partner Violence: Not on file    Family History: Family History  Problem Relation Age of Onset   Cerebral aneurysm Father    Hypertension Father     Review of Systems: All other systems reviewed and are otherwise negative except as noted above.   Physical Exam: Vitals:  08/29/21 1005  BP: (!) 80/60  Pulse: 65  SpO2: 96%  Weight: 161 lb 3.2 oz (73.1 kg)  Height: 5' 11.5" (1.816 m)     GEN- The patient is well appearing, alert and oriented x 3 today.   HEENT: normocephalic, atraumatic; sclera clear, conjunctiva pink; hearing intact; oropharynx clear; neck supple, no JVP Lymph- no cervical lymphadenopathy Lungs- Clear to ausculation bilaterally, normal work of breathing.  No wheezes, rales, rhonchi Heart- Regular rate and rhythm, no murmurs, rubs or gallops, PMI not laterally displaced GI- soft, non-tender, non-distended, bowel sounds present, no hepatosplenomegaly Extremities- no clubbing or cyanosis. No edema; DP/PT/radial pulses 2+ bilaterally MS- no significant deformity or atrophy Skin- warm and dry, no rash or lesion; ICD pocket well healed Psych- euthymic mood, full affect Neuro- strength and sensation are intact  ICD interrogation- reviewed in detail today,  See PACEART report  EKG:  EKG is not ordered today.   Recent Labs: 10/05/2020: ALT 11; TSH 4.220 08/19/2021: BUN 13; Creatinine, Ser 1.18; Hemoglobin 12.5; Platelets 225; Potassium 4.1; Sodium 140   Wt Readings from Last 3 Encounters:  08/29/21 161 lb 3.2 oz (73.1 kg)  08/23/21 158 lb (71.7 kg)  08/19/21 158 lb 6.4 oz (71.8 kg)     Other studies Reviewed: Additional studies/ records that were reviewed today include: Previous EP notes   Assessment and Plan:  1.  Chronic systolic dysfunction s/p Medtronic dual  chamber ICD  Update Echo 6 months or so prior to seeing Dr. Caryl Comes euvolemic today Stable on an appropriate medical regimen Normal ICD function See Pace Art report No changes today  2. H/o VT No recent therapies. They will discuss as a therapy if they want to continue with ICD at next gen change vs pacer.  3. PAF/AFL Burden by device low and confounded by atrial lead noise  4. CAD s/p CABG Denies ischemic symptoms  5. HTN Soft today. Would decrease coreg or switch to toprol if remains low at home.  Current medicines are reviewed at length with the patient today.    Disposition:   Follow up with Dr. Caryl Comes  6 months to discuss the intricacies of his gen change. He has had several access into that pocket. Will at the very least need pouch.   Jacalyn Lefevre, PA-C  08/29/2021 10:28 AM  [facility] [address] [place] [address] [phone] (office) [phone] (fax)

## 2021-08-29 ENCOUNTER — Ambulatory Visit (INDEPENDENT_AMBULATORY_CARE_PROVIDER_SITE_OTHER): Payer: Medicare Other | Admitting: Student

## 2021-08-29 ENCOUNTER — Other Ambulatory Visit: Payer: Self-pay

## 2021-08-29 ENCOUNTER — Encounter: Payer: Self-pay | Admitting: Student

## 2021-08-29 VITALS — BP 80/60 | HR 65 | Ht 71.5 in | Wt 161.2 lb

## 2021-08-29 DIAGNOSIS — I2510 Atherosclerotic heart disease of native coronary artery without angina pectoris: Secondary | ICD-10-CM | POA: Diagnosis not present

## 2021-08-29 DIAGNOSIS — I472 Ventricular tachycardia, unspecified: Secondary | ICD-10-CM

## 2021-08-29 DIAGNOSIS — I255 Ischemic cardiomyopathy: Secondary | ICD-10-CM | POA: Diagnosis not present

## 2021-08-29 LAB — CUP PACEART INCLINIC DEVICE CHECK
Battery Remaining Longevity: 9 mo
Battery Voltage: 2.85 V
Brady Statistic AP VP Percent: 8.94 %
Brady Statistic AP VS Percent: 76.86 %
Brady Statistic AS VP Percent: 3.55 %
Brady Statistic AS VS Percent: 10.65 %
Brady Statistic RA Percent Paced: 82.03 %
Brady Statistic RV Percent Paced: 12.39 %
Date Time Interrogation Session: 20220901103331
HighPow Impedance: 40 Ohm
HighPow Impedance: 61 Ohm
Implantable Lead Implant Date: 19990520
Implantable Lead Implant Date: 20011228
Implantable Lead Location: 753859
Implantable Lead Location: 753860
Implantable Lead Model: 6940
Implantable Lead Model: 6947
Implantable Pulse Generator Implant Date: 20140331
Lead Channel Impedance Value: 323 Ohm
Lead Channel Impedance Value: 380 Ohm
Lead Channel Impedance Value: 456 Ohm
Lead Channel Pacing Threshold Amplitude: 0.75 V
Lead Channel Pacing Threshold Amplitude: 0.875 V
Lead Channel Pacing Threshold Pulse Width: 0.4 ms
Lead Channel Pacing Threshold Pulse Width: 0.4 ms
Lead Channel Sensing Intrinsic Amplitude: 0.5 mV
Lead Channel Sensing Intrinsic Amplitude: 0.875 mV
Lead Channel Sensing Intrinsic Amplitude: 12.5 mV
Lead Channel Sensing Intrinsic Amplitude: 13.5 mV
Lead Channel Setting Pacing Amplitude: 2 V
Lead Channel Setting Pacing Amplitude: 2.5 V
Lead Channel Setting Pacing Pulse Width: 0.4 ms
Lead Channel Setting Sensing Sensitivity: 0.3 mV

## 2021-08-29 NOTE — Patient Instructions (Signed)
Medication Instructions:  Your physician recommends that you continue on your current medications as directed. Please refer to the Current Medication list given to you today.  *If you need a refill on your cardiac medications before your next appointment, please call your pharmacy*   Lab Work: None If you have labs (blood work) drawn today and your tests are completely normal, you will receive your results only by: [place] (if you have MyChart) OR A paper copy in the mail If you have any lab test that is abnormal or we need to change your treatment, we will call you to review the results.   Testing/Procedures: Your physician has requested that you have an echocardiogram. Echocardiography is a painless test that uses sound waves to create images of your heart. It provides your doctor with information about the size and shape of your heart and how well your heart's chambers and valves are working. This procedure takes approximately one hour. There are no restrictions for this procedure.   Follow-Up: At [facility], you and your health needs are our priority.  As part of our continuing mission to provide you with exceptional heart care, we have created designated Provider Care Teams.  These Care Teams include your primary Cardiologist (physician) and Advanced Practice Providers (APPs -  Physician Assistants and Nurse Practitioners) who all work together to provide you with the care you need, when you need it.   Your next appointment:   With Dr Caryl Comes after your Echocardiogram

## 2021-09-09 ENCOUNTER — Telehealth: Payer: Self-pay | Admitting: Cardiovascular Disease

## 2021-09-09 MED ORDER — LISINOPRIL 5 MG PO TABS: 5.0000 mg | ORAL_TABLET | Freq: Every day | ORAL | 3 refills | Status: AC

## 2021-09-09 MED ORDER — CARVEDILOL 6.25 MG PO TABS: 6.2500 mg | ORAL_TABLET | Freq: Two times a day (BID) | ORAL | 3 refills | Status: DC

## 2021-09-09 NOTE — Telephone Encounter (Signed)
Pt's medication was sent to pt's pharmacy as requested. Confirmation received.  °

## 2021-09-09 NOTE — Telephone Encounter (Signed)
*  STAT* If patient is at the pharmacy, call can be transferred to refill team.   1. Which medications need to be refilled? (please list name of each medication and dose if known)  carvedilol (COREG) 6.25 MG tablet; lisinopril (ZESTRIL) 5 MG tablet  2. Which pharmacy/location (including street and city if local pharmacy) is medication to be sent to?  [facility] - [place], [place] - [place] [address]  3. Do they need a 30 day or 90 day supply? [place]

## 2021-09-11 ENCOUNTER — Other Ambulatory Visit: Payer: Self-pay | Admitting: Cardiovascular Disease

## 2021-09-27 DIAGNOSIS — E785 Hyperlipidemia, unspecified: Secondary | ICD-10-CM | POA: Diagnosis not present

## 2021-09-27 DIAGNOSIS — I5022 Chronic systolic (congestive) heart failure: Secondary | ICD-10-CM | POA: Diagnosis not present

## 2021-09-27 DIAGNOSIS — N183 Chronic kidney disease, stage 3 unspecified: Secondary | ICD-10-CM | POA: Diagnosis not present

## 2021-09-27 DIAGNOSIS — I130 Hypertensive heart and chronic kidney disease with heart failure and stage 1 through stage 4 chronic kidney disease, or unspecified chronic kidney disease: Secondary | ICD-10-CM | POA: Diagnosis not present

## 2021-10-22 ENCOUNTER — Ambulatory Visit (INDEPENDENT_AMBULATORY_CARE_PROVIDER_SITE_OTHER): Payer: Medicare Other

## 2021-10-22 DIAGNOSIS — I472 Ventricular tachycardia, unspecified: Secondary | ICD-10-CM

## 2021-10-22 LAB — CUP PACEART REMOTE DEVICE CHECK
Battery Remaining Longevity: 8 mo
Battery Voltage: 2.84 V
Brady Statistic AP VP Percent: 8.23 %
Brady Statistic AP VS Percent: 81.11 %
Brady Statistic AS VP Percent: 3.6 %
Brady Statistic AS VS Percent: 7.06 %
Brady Statistic RA Percent Paced: 85.86 %
Brady Statistic RV Percent Paced: 11.8 %
Date Time Interrogation Session: 20221025012405
HighPow Impedance: 36 Ohm
HighPow Impedance: 58 Ohm
Implantable Lead Implant Date: 19990520
Implantable Lead Implant Date: 20011228
Implantable Lead Location: 753859
Implantable Lead Location: 753860
Implantable Lead Model: 6940
Implantable Lead Model: 6947
Implantable Pulse Generator Implant Date: 20140331
Lead Channel Impedance Value: 323 Ohm
Lead Channel Impedance Value: 380 Ohm
Lead Channel Impedance Value: 437 Ohm
Lead Channel Pacing Threshold Amplitude: 0.75 V
Lead Channel Pacing Threshold Amplitude: 0.75 V
Lead Channel Pacing Threshold Pulse Width: 0.4 ms
Lead Channel Pacing Threshold Pulse Width: 0.4 ms
Lead Channel Sensing Intrinsic Amplitude: 0.5 mV
Lead Channel Sensing Intrinsic Amplitude: 0.5 mV
Lead Channel Sensing Intrinsic Amplitude: 11.375 mV
Lead Channel Sensing Intrinsic Amplitude: 11.375 mV
Lead Channel Setting Pacing Amplitude: 2 V
Lead Channel Setting Pacing Amplitude: 2.5 V
Lead Channel Setting Pacing Pulse Width: 0.4 ms
Lead Channel Setting Sensing Sensitivity: 0.3 mV

## 2021-10-30 NOTE — Progress Notes (Signed)
Remote ICD transmission.   

## 2021-11-07 DIAGNOSIS — Z23 Encounter for immunization: Secondary | ICD-10-CM | POA: Diagnosis not present

## 2021-12-04 ENCOUNTER — Other Ambulatory Visit: Payer: Self-pay | Admitting: Cardiovascular Disease

## 2021-12-13 DIAGNOSIS — Z125 Encounter for screening for malignant neoplasm of prostate: Secondary | ICD-10-CM | POA: Diagnosis not present

## 2021-12-13 DIAGNOSIS — I10 Essential (primary) hypertension: Secondary | ICD-10-CM | POA: Diagnosis not present

## 2021-12-13 DIAGNOSIS — E785 Hyperlipidemia, unspecified: Secondary | ICD-10-CM | POA: Diagnosis not present

## 2021-12-13 DIAGNOSIS — E039 Hypothyroidism, unspecified: Secondary | ICD-10-CM | POA: Diagnosis not present

## 2021-12-13 DIAGNOSIS — E538 Deficiency of other specified B group vitamins: Secondary | ICD-10-CM | POA: Diagnosis not present

## 2021-12-18 DIAGNOSIS — E785 Hyperlipidemia, unspecified: Secondary | ICD-10-CM | POA: Diagnosis not present

## 2021-12-18 DIAGNOSIS — N401 Enlarged prostate with lower urinary tract symptoms: Secondary | ICD-10-CM | POA: Diagnosis not present

## 2021-12-18 DIAGNOSIS — I5022 Chronic systolic (congestive) heart failure: Secondary | ICD-10-CM | POA: Diagnosis not present

## 2021-12-18 DIAGNOSIS — Z1331 Encounter for screening for depression: Secondary | ICD-10-CM | POA: Diagnosis not present

## 2021-12-18 DIAGNOSIS — Z1339 Encounter for screening examination for other mental health and behavioral disorders: Secondary | ICD-10-CM | POA: Diagnosis not present

## 2021-12-18 DIAGNOSIS — Z0000 Encounter for general adult medical examination without abnormal findings: Secondary | ICD-10-CM | POA: Diagnosis not present

## 2021-12-18 DIAGNOSIS — R82998 Other abnormal findings in urine: Secondary | ICD-10-CM | POA: Diagnosis not present

## 2021-12-18 DIAGNOSIS — I4891 Unspecified atrial fibrillation: Secondary | ICD-10-CM | POA: Diagnosis not present

## 2021-12-18 DIAGNOSIS — I130 Hypertensive heart and chronic kidney disease with heart failure and stage 1 through stage 4 chronic kidney disease, or unspecified chronic kidney disease: Secondary | ICD-10-CM | POA: Diagnosis not present

## 2021-12-18 DIAGNOSIS — E039 Hypothyroidism, unspecified: Secondary | ICD-10-CM | POA: Diagnosis not present

## 2021-12-18 DIAGNOSIS — Z950 Presence of cardiac pacemaker: Secondary | ICD-10-CM | POA: Diagnosis not present

## 2021-12-18 DIAGNOSIS — I2510 Atherosclerotic heart disease of native coronary artery without angina pectoris: Secondary | ICD-10-CM | POA: Diagnosis not present

## 2021-12-27 DIAGNOSIS — I5022 Chronic systolic (congestive) heart failure: Secondary | ICD-10-CM | POA: Diagnosis not present

## 2021-12-27 DIAGNOSIS — N183 Chronic kidney disease, stage 3 unspecified: Secondary | ICD-10-CM | POA: Diagnosis not present

## 2021-12-27 DIAGNOSIS — I130 Hypertensive heart and chronic kidney disease with heart failure and stage 1 through stage 4 chronic kidney disease, or unspecified chronic kidney disease: Secondary | ICD-10-CM | POA: Diagnosis not present

## 2021-12-27 DIAGNOSIS — E785 Hyperlipidemia, unspecified: Secondary | ICD-10-CM | POA: Diagnosis not present

## 2022-01-13 DIAGNOSIS — Z1152 Encounter for screening for COVID-19: Secondary | ICD-10-CM | POA: Diagnosis not present

## 2022-01-13 DIAGNOSIS — R5383 Other fatigue: Secondary | ICD-10-CM | POA: Diagnosis not present

## 2022-01-13 DIAGNOSIS — J069 Acute upper respiratory infection, unspecified: Secondary | ICD-10-CM | POA: Diagnosis not present

## 2022-01-13 DIAGNOSIS — I4891 Unspecified atrial fibrillation: Secondary | ICD-10-CM | POA: Diagnosis not present

## 2022-01-21 ENCOUNTER — Ambulatory Visit (INDEPENDENT_AMBULATORY_CARE_PROVIDER_SITE_OTHER): Payer: Medicare Other

## 2022-01-21 ENCOUNTER — Telehealth: Payer: Self-pay

## 2022-01-21 DIAGNOSIS — I255 Ischemic cardiomyopathy: Secondary | ICD-10-CM | POA: Diagnosis not present

## 2022-01-21 LAB — CUP PACEART REMOTE DEVICE CHECK
Battery Remaining Longevity: 6 mo
Battery Voltage: 2.82 V
Brady Statistic AP VP Percent: 7 %
Brady Statistic AP VS Percent: 78.94 %
Brady Statistic AS VP Percent: 3.23 %
Brady Statistic AS VS Percent: 10.83 %
Brady Statistic RA Percent Paced: 82.37 %
Brady Statistic RV Percent Paced: 10 %
Date Time Interrogation Session: 20230124001606
HighPow Impedance: 38 Ohm
HighPow Impedance: 59 Ohm
Implantable Lead Implant Date: 19990520
Implantable Lead Implant Date: 20011228
Implantable Lead Location: 753859
Implantable Lead Location: 753860
Implantable Lead Model: 6940
Implantable Lead Model: 6947
Implantable Pulse Generator Implant Date: 20140331
Lead Channel Impedance Value: 323 Ohm
Lead Channel Impedance Value: 380 Ohm
Lead Channel Impedance Value: 437 Ohm
Lead Channel Pacing Threshold Amplitude: 0.75 V
Lead Channel Pacing Threshold Amplitude: 0.75 V
Lead Channel Pacing Threshold Pulse Width: 0.4 ms
Lead Channel Pacing Threshold Pulse Width: 0.4 ms
Lead Channel Sensing Intrinsic Amplitude: 0.5 mV
Lead Channel Sensing Intrinsic Amplitude: 0.5 mV
Lead Channel Sensing Intrinsic Amplitude: 11 mV
Lead Channel Sensing Intrinsic Amplitude: 11 mV
Lead Channel Setting Pacing Amplitude: 2 V
Lead Channel Setting Pacing Amplitude: 2.5 V
Lead Channel Setting Pacing Pulse Width: 0.4 ms
Lead Channel Setting Sensing Sensitivity: 0.3 mV

## 2022-01-21 NOTE — Telephone Encounter (Signed)
Scheduled remote transmission received, device is 6 months until ERI.  Set up monthly battery checks.  Spoke with wife advised of next check 02/21/22.  Patient is scheduled for OV with Dr. Caryl Comes on 03/06/22.

## 2022-01-23 NOTE — Progress Notes (Signed)
Monthly battery checks scheduled.

## 2022-01-31 NOTE — Progress Notes (Signed)
Remote ICD transmission.   

## 2022-02-04 DIAGNOSIS — H18453 Nodular corneal degeneration, bilateral: Secondary | ICD-10-CM | POA: Diagnosis not present

## 2022-02-21 ENCOUNTER — Encounter (HOSPITAL_BASED_OUTPATIENT_CLINIC_OR_DEPARTMENT_OTHER): Payer: Self-pay | Admitting: Emergency Medicine

## 2022-02-21 ENCOUNTER — Emergency Department (HOSPITAL_BASED_OUTPATIENT_CLINIC_OR_DEPARTMENT_OTHER): Payer: Medicare Other

## 2022-02-21 ENCOUNTER — Emergency Department (HOSPITAL_BASED_OUTPATIENT_CLINIC_OR_DEPARTMENT_OTHER)
Admission: EM | Admit: 2022-02-21 | Discharge: 2022-02-21 | Disposition: A | Discharge status: Home / Self Care | Payer: Medicare Other | Attending: Emergency Medicine | Admitting: Emergency Medicine

## 2022-02-21 ENCOUNTER — Other Ambulatory Visit: Payer: Self-pay

## 2022-02-21 ENCOUNTER — Ambulatory Visit (INDEPENDENT_AMBULATORY_CARE_PROVIDER_SITE_OTHER): Payer: Medicare Other

## 2022-02-21 DIAGNOSIS — J189 Pneumonia, unspecified organism: Secondary | ICD-10-CM | POA: Diagnosis not present

## 2022-02-21 DIAGNOSIS — J181 Lobar pneumonia, unspecified organism: Secondary | ICD-10-CM | POA: Insufficient documentation

## 2022-02-21 DIAGNOSIS — R0989 Other specified symptoms and signs involving the circulatory and respiratory systems: Secondary | ICD-10-CM | POA: Diagnosis not present

## 2022-02-21 DIAGNOSIS — R509 Fever, unspecified: Secondary | ICD-10-CM | POA: Diagnosis not present

## 2022-02-21 DIAGNOSIS — Z20822 Contact with and (suspected) exposure to covid-19: Secondary | ICD-10-CM | POA: Insufficient documentation

## 2022-02-21 DIAGNOSIS — R059 Cough, unspecified: Secondary | ICD-10-CM | POA: Diagnosis not present

## 2022-02-21 DIAGNOSIS — Z7982 Long term (current) use of aspirin: Secondary | ICD-10-CM | POA: Insufficient documentation

## 2022-02-21 DIAGNOSIS — Z79899 Other long term (current) drug therapy: Secondary | ICD-10-CM | POA: Insufficient documentation

## 2022-02-21 DIAGNOSIS — I255 Ischemic cardiomyopathy: Secondary | ICD-10-CM

## 2022-02-21 LAB — RESP PANEL BY RT-PCR (FLU A&B, COVID) ARPGX2
Influenza A by PCR: NEGATIVE
Influenza B by PCR: NEGATIVE
SARS Coronavirus 2 by RT PCR: NEGATIVE

## 2022-02-21 MED ORDER — AZITHROMYCIN 250 MG PO TABS
500.0000 mg | ORAL_TABLET | Freq: Once | ORAL | Status: AC
  Administered 2022-02-21: 500 mg via ORAL
  Filled 2022-02-21: qty 2

## 2022-02-21 MED ORDER — ACETAMINOPHEN 325 MG PO TABS
650.0000 mg | ORAL_TABLET | Freq: Once | ORAL | Status: AC
  Administered 2022-02-21: 650 mg via ORAL
  Filled 2022-02-21: qty 2

## 2022-02-21 MED ORDER — AZITHROMYCIN 250 MG PO TABS: 250.0000 mg | ORAL_TABLET | Freq: Every day | ORAL | 0 refills | Status: AC

## 2022-02-21 NOTE — ED Provider Notes (Signed)
[facility] EMERGENCY DEPARTMENT Provider Note   CSN: [id number] Arrival date & time: 02/21/22  2505     History  Chief Complaint  Patient presents with   Cough   Fever    [name] is a 87 y.o. male.   Cough Cough characteristics:  Productive Sputum characteristics:  Yellow and white Severity:  Mild Onset quality:  Gradual Timing:  Constant Chronicity:  New Smoker: no   Relieved by:  Nothing Worsened by:  Nothing Ineffective treatments:  None tried Associated symptoms: fever   Fever Associated symptoms: cough       Home Medications Prior to Admission medications   Medication Sig Start Date End Date Taking? Authorizing Provider  azithromycin (ZITHROMAX) 250 MG tablet Take 1 tablet (250 mg total) by mouth daily. Take first 2 tablets together, then 1 every day until finished. 02/21/22  Yes Celester Morgan, Corene Cornea, MD  amiodarone (PACERONE) 100 MG tablet TAKE 1/2 TABLET BY MOUTH EVERY MORNING 12/04/21   Nahser, Wonda Cheng, MD  aspirin EC 81 MG tablet Take 81 mg by mouth daily.    [provider]  b complex vitamins tablet Take 1 tablet by mouth daily.    [provider]  carvedilol (COREG) 6.25 MG tablet Take 1 tablet (6.25 mg total) by mouth 2 (two) times daily with a meal. 09/09/21   Nahser, Wonda Cheng, MD  Coenzyme Q10 (COQ10 PO) Take 100 mg by mouth daily.    [provider]  Cyanocobalamin (VITAMIN B-12 IJ) Inject as directed every 30 (thirty) days.    [provider]  donepezil (ARICEPT) 5 MG tablet Take 5 mg by mouth at bedtime.     [provider]  escitalopram (LEXAPRO) 5 MG tablet Take 5 mg by mouth daily. 07/18/18   [provider]  fenofibrate 160 MG tablet TAKE ONE TABLET BY MOUTH EVERY MORNING 12/04/21   Nahser, Wonda Cheng, MD  HYDROcodone-acetaminophen (NORCO/VICODIN) 5-325 MG tablet Take 1 tablet by mouth every 6 (six) hours as needed (pain). Patient not taking: Reported on 08/29/2021 08/24/21   Judith Part, MD  levothyroxine (SYNTHROID, LEVOTHROID) 75 MCG tablet Take 1 tablet (75 mcg total) by mouth daily before breakfast. 12/30/17   Aline August, MD  lisinopril (ZESTRIL) 5 MG tablet Take 1 tablet (5 mg total) by mouth daily. 09/09/21   Nahser, Wonda Cheng, MD  memantine (NAMENDA) 10 MG tablet Take 1 tablet (10 mg total) by mouth 2 (two) times daily. 06/24/21   Cameron Sprang, MD  Multiple Vitamin (MULTIVITAMIN WITH MINERALS) TABS tablet Take 1 tablet by mouth daily.    [provider]  nitroGLYCERIN (NITROSTAT) 0.4 MG SL tablet Place 0.4 mg under the tongue every 5 (five) minutes as needed for chest pain. 09/07/19   [provider]  spironolactone (ALDACTONE) 25 MG tablet TAKE 1/2 TABLET BY MOUTH EVERY MORNING 09/11/21   Nahser, Wonda Cheng, MD  traMADol (ULTRAM) 50 MG tablet Take 50 mg by mouth every 6 (six) hours as needed. 08/23/21   [provider]      Allergies    Prednisone    Review of Systems   Review of Systems  Constitutional:  Positive for fever.  Respiratory:  Positive for cough.    Physical Exam Updated Vital Signs BP (!) 115/59 (BP Location: Right Arm)    Pulse 77    Temp 100.3 F (37.9 C) (Oral)    Resp 20    Ht 5\' 10"  (1.778 m)  Wt 77.1 kg    SpO2 94%    BMI 24.39 kg/m  Physical Exam Vitals and nursing note reviewed.  Constitutional:      Appearance: He is well-developed.  HENT:     Head: Normocephalic and atraumatic.     Mouth/Throat:     Mouth: Mucous membranes are moist.  Eyes:     Conjunctiva/sclera: Conjunctivae normal.     Pupils: Pupils are equal, round, and reactive to light.  Cardiovascular:     Rate and Rhythm: Normal rate.  Pulmonary:     Effort: Pulmonary effort is normal. No respiratory distress.     Breath sounds: Rales (L>R lower lobe) present.  Abdominal:     General: Abdomen is flat. There is no distension.  Musculoskeletal:        General: Normal range of motion.     Cervical back: Normal range of motion.   Skin:    General: Skin is warm and dry.  Neurological:     General: No focal deficit present.     Mental Status: He is alert.    ED Results / Procedures / Treatments   Labs (all labs ordered are listed, but only abnormal results are displayed) Labs Reviewed  RESP PANEL BY RT-PCR (FLU A&B, COVID) [id number]    EKG None  Radiology DG Chest 2 View  Result Date: 02/21/2022 CLINICAL DATA:  Fever, cough, abnormal breath sounds. EXAM: CHEST - 2 VIEW COMPARISON:  Chest CT 04/07/2018. FINDINGS: The heart is slightly enlarged. There are old CABG changes, sternotomy sutures and left chest dual lead pacing system. No vascular congestion is seen. Calcifications in the anterior left ventricular wall and aortic arch are again shown. The lungs are mildly emphysematous. Scarring, tenting and volume loss in the right base is again shown. No focal pneumonia is seen. Osteopenia. Chronic healed fracture of the midshaft right clavicle is again noted and bilateral chronic rotator cuff arthropathy with osteopenia. IMPRESSION: Multiple chronic changes, but no evidence of acute chest disease. Aortic atherosclerosis. Electronically Signed   By: Telford Nab M.D.   On: 02/21/2022 04:20    Procedures Procedures    Medications Ordered in ED Medications  azithromycin (ZITHROMAX) tablet 500 mg (has no administration in time range)  acetaminophen (TYLENOL) tablet 650 mg (650 mg Oral Given 02/21/22 0353)    ED Course/ Medical Decision Making/ A&P                           Medical Decision Making Amount and/or Complexity of Data Reviewed Radiology: ordered.  Risk OTC drugs. Prescription drug management.   Suspect early community acquired pneumonia. Covid/flu negative. Otherwise feels and appears well. No hypoxia, tachypnea or other indications for admission or further workup. Will treat with azithro. Pcp follow up in 3-4 days for recheck, here if worsening.    Final Clinical Impression(s) / ED  Diagnoses Final diagnoses:  Community acquired pneumonia of left lower lobe of lung    Rx / DC Orders ED Discharge Orders          Ordered    azithromycin (ZITHROMAX) 250 MG tablet  Daily        02/21/22 0506              Treysen Sudbeck, Corene Cornea, MD 02/21/22 7893

## 2022-02-21 NOTE — ED Triage Notes (Signed)
Per pt son pt has had a cough, seen by PCP and chest XR was negative.cough has changed and developed fever today.

## 2022-02-22 LAB — CUP PACEART REMOTE DEVICE CHECK
Battery Remaining Longevity: 5 mo
Battery Voltage: 2.82 V
Brady Statistic AP VP Percent: 5.58 %
Brady Statistic AP VS Percent: 73.71 %
Brady Statistic AS VP Percent: 2.39 %
Brady Statistic AS VS Percent: 18.33 %
Brady Statistic RA Percent Paced: 76.18 %
Brady Statistic RV Percent Paced: 7.82 %
Date Time Interrogation Session: 20230225003807
HighPow Impedance: 39 Ohm
HighPow Impedance: 60 Ohm
Implantable Lead Implant Date: 19990520
Implantable Lead Implant Date: 20011228
Implantable Lead Location: 753859
Implantable Lead Location: 753860
Implantable Lead Model: 6940
Implantable Lead Model: 6947
Implantable Pulse Generator Implant Date: 20140331
Lead Channel Impedance Value: 342 Ohm
Lead Channel Impedance Value: 380 Ohm
Lead Channel Impedance Value: 437 Ohm
Lead Channel Pacing Threshold Amplitude: 0.875 V
Lead Channel Pacing Threshold Amplitude: 0.875 V
Lead Channel Pacing Threshold Pulse Width: 0.4 ms
Lead Channel Pacing Threshold Pulse Width: 0.4 ms
Lead Channel Sensing Intrinsic Amplitude: 0.75 mV
Lead Channel Sensing Intrinsic Amplitude: 0.75 mV
Lead Channel Sensing Intrinsic Amplitude: 11.5 mV
Lead Channel Sensing Intrinsic Amplitude: 11.5 mV
Lead Channel Setting Pacing Amplitude: 2 V
Lead Channel Setting Pacing Amplitude: 2.5 V
Lead Channel Setting Pacing Pulse Width: 0.4 ms
Lead Channel Setting Sensing Sensitivity: 0.3 mV

## 2022-02-25 DIAGNOSIS — J189 Pneumonia, unspecified organism: Secondary | ICD-10-CM | POA: Diagnosis not present

## 2022-02-25 DIAGNOSIS — I4891 Unspecified atrial fibrillation: Secondary | ICD-10-CM | POA: Diagnosis not present

## 2022-02-25 DIAGNOSIS — I10 Essential (primary) hypertension: Secondary | ICD-10-CM | POA: Diagnosis not present

## 2022-02-25 NOTE — Addendum Note (Signed)
Addended by: Cheri Kearns A on: 02/25/2022 02:12 PM   Modules accepted: Level of Service

## 2022-02-25 NOTE — Progress Notes (Signed)
Remote ICD transmission.   

## 2022-02-26 ENCOUNTER — Ambulatory Visit (HOSPITAL_COMMUNITY): Payer: Medicare Other | Attending: Cardiology

## 2022-02-26 ENCOUNTER — Other Ambulatory Visit: Payer: Self-pay

## 2022-02-26 DIAGNOSIS — I255 Ischemic cardiomyopathy: Secondary | ICD-10-CM | POA: Diagnosis not present

## 2022-02-26 LAB — ECHOCARDIOGRAM COMPLETE
Area-P 1/2: 5.06 cm2
S' Lateral: 5.6 cm

## 2022-02-26 MED ORDER — PERFLUTREN LIPID MICROSPHERE
1.0000 mL | INTRAVENOUS | Status: AC | PRN
  Administered 2022-02-26: 1 mL via INTRAVENOUS

## 2022-03-06 ENCOUNTER — Other Ambulatory Visit: Payer: Self-pay

## 2022-03-06 ENCOUNTER — Ambulatory Visit (INDEPENDENT_AMBULATORY_CARE_PROVIDER_SITE_OTHER): Payer: Medicare Other | Admitting: Internal Medicine

## 2022-03-06 ENCOUNTER — Encounter: Payer: Self-pay | Admitting: Internal Medicine

## 2022-03-06 VITALS — BP 116/80 | HR 68 | Ht 70.0 in | Wt 159.0 lb

## 2022-03-06 DIAGNOSIS — Z95810 Presence of automatic (implantable) cardiac defibrillator: Secondary | ICD-10-CM | POA: Diagnosis not present

## 2022-03-06 DIAGNOSIS — I255 Ischemic cardiomyopathy: Secondary | ICD-10-CM

## 2022-03-06 DIAGNOSIS — I5022 Chronic systolic (congestive) heart failure: Secondary | ICD-10-CM | POA: Diagnosis not present

## 2022-03-06 DIAGNOSIS — I472 Ventricular tachycardia, unspecified: Secondary | ICD-10-CM | POA: Diagnosis not present

## 2022-03-06 DIAGNOSIS — I480 Paroxysmal atrial fibrillation: Secondary | ICD-10-CM

## 2022-03-06 DIAGNOSIS — E032 Hypothyroidism due to medicaments and other exogenous substances: Secondary | ICD-10-CM

## 2022-03-06 NOTE — Patient Instructions (Signed)
Medication Instructions:  ?Your physician recommends that you continue on your current medications as directed. Please refer to the Current Medication list given to you today. ? ?*If you need a refill on your cardiac medications before your next appointment, please call your pharmacy* ? ? ?Lab Work: ?TSH and Liver panel today ? ?If you have labs (blood work) drawn today and your tests are completely normal, you will receive your results only by: ?MyChart Message (if you have MyChart) OR ?A paper copy in the mail ?If you have any lab test that is abnormal or we need to change your treatment, we will call you to review the results. ? ? ?Testing/Procedures: ?None ordered. ? ? ? ?Follow-Up: ?At [facility], you and your health needs are our priority.  As part of our continuing mission to provide you with exceptional heart care, we have created designated Provider Care Teams.  These Care Teams include your primary Cardiologist (physician) and Advanced Practice Providers (APPs -  Physician Assistants and Nurse Practitioners) who all work together to provide you with the care you need, when you need it. ? ?We recommend signing up for the patient portal called "MyChart".  Sign up information is provided on this After Visit Summary.  MyChart is used to connect with patients for Virtual Visits (Telemedicine).  Patients are able to view lab/test results, encounter notes, upcoming appointments, etc.  Non-urgent messages can be sent to your provider as well.   ?To learn more about what you can do with MyChart, go to [email or web address].   ? ?Your next appointment:   ?12 months with Dr Caryl Comes ?

## 2022-03-06 NOTE — Progress Notes (Signed)
? ? ? ? ?Patient Care Team: ?Burnard Bunting, MD as PCP - General (Internal Medicine) ?Nahser, Wonda Cheng, MD as PCP - Cardiology (Cardiology) ?Deboraha Sprang, MD as PCP - Electrophysiology (Cardiology) ? ? ?HPI ? ?[name] is a 87 y.o. male s/p ICD dual chamber for VT and cardiac arrestin the setting of ischemic cardiomyopathy with prior myocardial infarction, prior bypass surgery in 1995.  History of ventricular tachycardia storm >>> amiodarone high doses-- poorly tolerated because of nausea and jitteriness low-dose amiodarone ?  ?He has  recent catheterization (January 2012) whiich demonstrated patent vein grafts with an atretic LIMA to his ramus. Ejection fraction was 15-20%.  ? ?DATE TEST EF   ?12/06 Echo   15-20 %   ?12/18 Echo   25-30 %   ?3/23 Echo  20-25%   ?     ? ? ? ?Hospitalized 1/19 for lap cholecystectomy complicated by atrial fibrillation and rapid rate ICD shock. ? ?No complaints of chest pain or shortness of breath.  He is able to do what he wants.  No edema nocturnal dyspnea orthopnea I guess he needs amiodarone ?  ? ?Date Cr K TSH LFTs Cr LDL  ?3/19   2.265 15 1.24   ?8/22 1.18 4.1 4.22  1.18   ?        ?  ? ?Device History: ?  ?  Date Replacement  Status   ?RA Lead 1999    ?RV Lead 1995 2001   ?LV Lead     ?Generator 1995 1999/2008/2014   ?  ? ? ? ?Past Medical History:  ?Diagnosis Date  ? Atrial arrhythmia/a fibrillation   ? not on coumadin 2/2 subdural hematoma  ? Automatic implantable cardiac defibrillator in situ   ? medtronic  ? Automatic implantable cardioverter-defibrillator in situ   ? CHF (congestive heart failure) ([place])   ? Chronic systolic congestive heart failure ([place])   ? Complication of anesthesia   ? per daughter "took a little bit coming out of it last time"  ? Coronary artery disease   ? Dementia ([place])   ? Depression   ? Dysrhythmia   ? Encounter for long-term (current) use of other medications   ? amiodarone  ? H/O hiatal hernia   ? Hyperlipidemia   ? Hypertension   ?  Hypothyroidism   ? Ischemic cardiomyopathy   ? status post CABG with patent vein grafts and an atretic LIMA to his ramus, January 2012  ? Myocardial infarction [facility])   ? 1995, non-STEMI January 2012  ? Pacemaker   ? Pneumonia   ? Subdural hematoma   ? on coumadin  ? Thyroid disease   ? hypothyroid  ? Ventricular fibrillation ([place])   ? Ventricular tachycardia   ?  VT Storm-January 2012- responded to Amiodarone  ? ? ?Past Surgical History:  ?Procedure Laterality Date  ? bilateral cranitomies for sub hematomas    ? BYPASS GRAFT  12/29/1993  ? 5  ? CHOLECYSTECTOMY N/A 12/26/2017  ? Procedure: LAPAROSCOPIC CHOLECYSTECTOMY;  Surgeon: Ileana Roup, MD;  Location: [facility];  Service: General;  Laterality: N/A;  ? CORONARY ARTERY BYPASS GRAFT  12/29/1993  ? 5  ? CRANIOTOMY Left 08/23/2021  ? Procedure: Left cranial wound revision, removal of plate anbd screw;  Surgeon: Erline Levine, MD;  Location: [place];  Service: Neurosurgery;  Laterality: Left;  ? IMPLANTABLE CARDIOVERTER DEFIBRILLATOR GENERATOR CHANGE N/A 03/28/2013  ? Procedure: IMPLANTABLE CARDIOVERTER DEFIBRILLATOR GENERATOR CHANGE;  Surgeon: Deboraha Sprang,  MD;  Location: [place] [facility] CATH LAB;  Service: Cardiovascular;  Laterality: N/A;  ? INGUINAL HERNIA REPAIR Left 05/15/2014  ? Procedure: HERNIA REPAIR INGUINAL ADULT;  Surgeon: Zenovia Jarred, MD;  Location: [facility];  Service: General;  Laterality: Left;  ? INSERT / REPLACE / REMOVE PACEMAKER  12/29/2012  ?  generator repl   ? INSERTION OF MESH Left 05/15/2014  ? Procedure: INSERTION OF MESH;  Surgeon: Zenovia Jarred, MD;  Location: [facility];  Service: General;  Laterality: Left;  ? IR GUIDED [place]  03/25/2018  ? IR RADIOLOGIST EVAL & MGMT  04/07/2018  ? MANDIBLE SURGERY    ? TONSILLECTOMY    ? ? ?Current Outpatient Medications  ?Medication Sig Dispense Refill  ? amiodarone (PACERONE) 100 MG tablet TAKE 1/2 TABLET BY MOUTH EVERY MORNING 45 tablet 2  ? aspirin EC 81 MG tablet Take 81 mg by mouth  daily.    ? b complex vitamins tablet Take 1 tablet by mouth daily.    ? carvedilol (COREG) 6.25 MG tablet Take 1 tablet (6.25 mg total) by mouth 2 (two) times daily with a meal. 180 tablet 3  ? Coenzyme Q10 (COQ10 PO) Take 100 mg by mouth daily.    ? Cyanocobalamin (VITAMIN B-12 IJ) Inject as directed every 30 (thirty) days.    ? donepezil (ARICEPT) 5 MG tablet Take 5 mg by mouth at bedtime.     ? escitalopram (LEXAPRO) 5 MG tablet Take 5 mg by mouth daily.  6  ? fenofibrate 160 MG tablet TAKE ONE TABLET BY MOUTH EVERY MORNING 90 tablet 2  ? levothyroxine (SYNTHROID, LEVOTHROID) 75 MCG tablet Take 1 tablet (75 mcg total) by mouth daily before breakfast.  3  ? lisinopril (ZESTRIL) 5 MG tablet Take 1 tablet (5 mg total) by mouth daily. 90 tablet 3  ? memantine (NAMENDA) 10 MG tablet Take 1 tablet (10 mg total) by mouth 2 (two) times daily. 180 tablet 3  ? Multiple Vitamin (MULTIVITAMIN WITH MINERALS) TABS tablet Take 1 tablet by mouth daily.    ? nitroGLYCERIN (NITROSTAT) 0.4 MG SL tablet Place 0.4 mg under the tongue every 5 (five) minutes as needed for chest pain.    ? spironolactone (ALDACTONE) 25 MG tablet TAKE 1/2 TABLET BY MOUTH EVERY MORNING 45 tablet 3  ? traMADol (ULTRAM) 50 MG tablet Take 50 mg by mouth every 6 (six) hours as needed.    ? azithromycin (ZITHROMAX) 250 MG tablet Take 1 tablet (250 mg total) by mouth daily. Take first 2 tablets together, then 1 every day until finished. (Patient not taking: Reported on 03/06/2022) 6 tablet 0  ? HYDROcodone-acetaminophen (NORCO/VICODIN) 5-325 MG tablet Take 1 tablet by mouth every 6 (six) hours as needed (pain). (Patient not taking: Reported on 03/06/2022) 30 tablet 0  ? ?No current facility-administered medications for this visit.  ? ? ?Allergies  ?Allergen Reactions  ? Prednisone Other (See Comments)  ?  Other reaction(s): Heart Racing does not like med  ? ? ?Review of Systems negative except from HPI and PMH ? ?Physical Exam ?BP 116/80   Pulse 68   Ht '5\' 10"'$   (1.778 m)   Wt 159 lb (72.1 kg)   SpO2 97%   BMI 22.81 kg/m?  ?Well developed and nourished in no acute distress ?HENT normal ?Neck supple with JVP-flat ?Clear ?Device pocket well healed; without hematoma or erythema.  There is no tethering  ?Regular rate and rhythm, no murmurs or gallops ?Abd-soft with  active BS ?No Clubbing cyanosis edema ?Skin-warm and dry ?A & Oriented  Grossly normal sensory and motor function ? ?ECG demonstrates atrial pacing at 68 ?Intervals 31/18/47 with right bundle branch block left axis deviation ?  ? ?ECG personally reviewed a paced at 64 ?Intervals 24/17/45 ?Axis left -76 ?Right bundle branch block left anterior fascicular block   ? ?Assessment and  Plan ? ?Ischemic cardiomyopathy ? ?Congestive heart failure-chronic-systolic ? ?Ventricular tachycardia ? ?Atrial lead failure ? ?High risk medication surveillance-amiodarone ? ?Hypothyroidism-treated ? ?Defibrillator-Medtronic ? ?No intercurrent ventricular tachycardia.  We will continue him on amiodarone 100 mg a day.  Also carvedilol 6.25 twice daily. ? ?With his cardiomyopathy, low blood pressure, we will continue him on the carvedilol at the current dose spironolactone 12.5 mg a day and lisinopril 5 mg. ? ?He is euvolemic.  We will continue his diuretics with just a spironolactone. ? ?Discussed the fact that he is coming up on ERI.  The challenges include decision making regarding his atrial lead.  There is significant oversensing mode switching and intermittent ventricular pacing.  I think given the fact that this would be his fourth vein access, and the fact that he is without symptoms not withstanding this longstanding issue, that we would not undertake atrial lead revision. ? ?His device is subpectoral, we would need a PlasmaBlade and would anticipate the use of the antimicrobial pouch to reduce what would likely be a 5-10% infection risk. ?  ? ?

## 2022-03-07 LAB — HEPATIC FUNCTION PANEL
ALT: 12 IU/L (ref 0–44)
AST: 27 IU/L (ref 0–40)
Albumin: 3.6 g/dL (ref 3.6–4.6)
Alkaline Phosphatase: 34 IU/L — ABNORMAL LOW (ref 44–121)
Bilirubin Total: 0.5 mg/dL (ref 0.0–1.2)
Bilirubin, Direct: 0.27 mg/dL (ref 0.00–0.40)
Total Protein: 6.9 g/dL (ref 6.0–8.5)

## 2022-03-07 LAB — TSH: TSH: 2.67 u[IU]/mL (ref 0.450–4.500)

## 2022-03-13 DIAGNOSIS — J189 Pneumonia, unspecified organism: Secondary | ICD-10-CM | POA: Diagnosis not present

## 2022-03-26 ENCOUNTER — Ambulatory Visit (INDEPENDENT_AMBULATORY_CARE_PROVIDER_SITE_OTHER): Payer: Medicare Other

## 2022-03-26 DIAGNOSIS — I472 Ventricular tachycardia, unspecified: Secondary | ICD-10-CM

## 2022-03-26 LAB — CUP PACEART REMOTE DEVICE CHECK
Battery Remaining Longevity: 4 mo
Battery Voltage: 2.81 V
Brady Statistic AP VP Percent: 7.07 %
Brady Statistic AP VS Percent: 76.99 %
Brady Statistic AS VP Percent: 3.17 %
Brady Statistic AS VS Percent: 12.78 %
Brady Statistic RA Percent Paced: 81.28 %
Brady Statistic RV Percent Paced: 10.08 %
Date Time Interrogation Session: 20230329012303
HighPow Impedance: 40 Ohm
HighPow Impedance: 64 Ohm
Implantable Lead Implant Date: 19990520
Implantable Lead Implant Date: 20011228
Implantable Lead Location: 753859
Implantable Lead Location: 753860
Implantable Lead Model: 6940
Implantable Lead Model: 6947
Implantable Pulse Generator Implant Date: 20140331
Lead Channel Impedance Value: 342 Ohm
Lead Channel Impedance Value: 380 Ohm
Lead Channel Impedance Value: 437 Ohm
Lead Channel Pacing Threshold Amplitude: 0.75 V
Lead Channel Pacing Threshold Amplitude: 0.75 V
Lead Channel Pacing Threshold Pulse Width: 0.4 ms
Lead Channel Pacing Threshold Pulse Width: 0.4 ms
Lead Channel Sensing Intrinsic Amplitude: 0.875 mV
Lead Channel Sensing Intrinsic Amplitude: 0.875 mV
Lead Channel Sensing Intrinsic Amplitude: 13.875 mV
Lead Channel Sensing Intrinsic Amplitude: 13.875 mV
Lead Channel Setting Pacing Amplitude: 2 V
Lead Channel Setting Pacing Amplitude: 2.5 V
Lead Channel Setting Pacing Pulse Width: 0.4 ms
Lead Channel Setting Sensing Sensitivity: 0.3 mV

## 2022-03-28 DIAGNOSIS — I130 Hypertensive heart and chronic kidney disease with heart failure and stage 1 through stage 4 chronic kidney disease, or unspecified chronic kidney disease: Secondary | ICD-10-CM | POA: Diagnosis not present

## 2022-03-28 DIAGNOSIS — N183 Chronic kidney disease, stage 3 unspecified: Secondary | ICD-10-CM | POA: Diagnosis not present

## 2022-03-28 DIAGNOSIS — I5022 Chronic systolic (congestive) heart failure: Secondary | ICD-10-CM | POA: Diagnosis not present

## 2022-03-28 DIAGNOSIS — E785 Hyperlipidemia, unspecified: Secondary | ICD-10-CM | POA: Diagnosis not present

## 2022-04-08 DIAGNOSIS — I4891 Unspecified atrial fibrillation: Secondary | ICD-10-CM | POA: Diagnosis not present

## 2022-04-08 DIAGNOSIS — R410 Disorientation, unspecified: Secondary | ICD-10-CM | POA: Diagnosis not present

## 2022-04-08 DIAGNOSIS — F03B18 Unspecified dementia, moderate, with other behavioral disturbance: Secondary | ICD-10-CM | POA: Diagnosis not present

## 2022-04-08 DIAGNOSIS — I10 Essential (primary) hypertension: Secondary | ICD-10-CM | POA: Diagnosis not present

## 2022-04-08 NOTE — Progress Notes (Signed)
Remote ICD transmission.   

## 2022-04-22 ENCOUNTER — Ambulatory Visit (INDEPENDENT_AMBULATORY_CARE_PROVIDER_SITE_OTHER): Payer: Medicare Other

## 2022-04-22 DIAGNOSIS — I472 Ventricular tachycardia, unspecified: Secondary | ICD-10-CM

## 2022-04-23 LAB — CUP PACEART REMOTE DEVICE CHECK
Battery Remaining Longevity: 3 mo
Battery Voltage: 2.79 V
Brady Statistic AP VP Percent: 5.85 %
Brady Statistic AP VS Percent: 80.1 %
Brady Statistic AS VP Percent: 2.56 %
Brady Statistic AS VS Percent: 11.48 %
Brady Statistic RA Percent Paced: 82.42 %
Brady Statistic RV Percent Paced: 8.26 %
Date Time Interrogation Session: 20230425001604
HighPow Impedance: 42 Ohm
HighPow Impedance: 64 Ohm
Implantable Lead Implant Date: 19990520
Implantable Lead Implant Date: 20011228
Implantable Lead Location: 753859
Implantable Lead Location: 753860
Implantable Lead Model: 6940
Implantable Lead Model: 6947
Implantable Pulse Generator Implant Date: 20140331
Lead Channel Impedance Value: 342 Ohm
Lead Channel Impedance Value: 399 Ohm
Lead Channel Impedance Value: 456 Ohm
Lead Channel Pacing Threshold Amplitude: 0.75 V
Lead Channel Pacing Threshold Amplitude: 0.75 V
Lead Channel Pacing Threshold Pulse Width: 0.4 ms
Lead Channel Pacing Threshold Pulse Width: 0.4 ms
Lead Channel Sensing Intrinsic Amplitude: 1 mV
Lead Channel Sensing Intrinsic Amplitude: 1 mV
Lead Channel Sensing Intrinsic Amplitude: 12 mV
Lead Channel Sensing Intrinsic Amplitude: 12 mV
Lead Channel Setting Pacing Amplitude: 2 V
Lead Channel Setting Pacing Amplitude: 2.5 V
Lead Channel Setting Pacing Pulse Width: 0.4 ms
Lead Channel Setting Sensing Sensitivity: 0.3 mV

## 2022-05-07 DIAGNOSIS — R634 Abnormal weight loss: Secondary | ICD-10-CM | POA: Diagnosis not present

## 2022-05-07 DIAGNOSIS — I4891 Unspecified atrial fibrillation: Secondary | ICD-10-CM | POA: Diagnosis not present

## 2022-05-07 DIAGNOSIS — E785 Hyperlipidemia, unspecified: Secondary | ICD-10-CM | POA: Diagnosis not present

## 2022-05-07 DIAGNOSIS — I5022 Chronic systolic (congestive) heart failure: Secondary | ICD-10-CM | POA: Diagnosis not present

## 2022-05-07 DIAGNOSIS — J189 Pneumonia, unspecified organism: Secondary | ICD-10-CM | POA: Diagnosis not present

## 2022-05-07 DIAGNOSIS — R410 Disorientation, unspecified: Secondary | ICD-10-CM | POA: Diagnosis not present

## 2022-05-07 DIAGNOSIS — I10 Essential (primary) hypertension: Secondary | ICD-10-CM | POA: Diagnosis not present

## 2022-05-07 DIAGNOSIS — F03B18 Unspecified dementia, moderate, with other behavioral disturbance: Secondary | ICD-10-CM | POA: Diagnosis not present

## 2022-05-07 DIAGNOSIS — R531 Weakness: Secondary | ICD-10-CM | POA: Diagnosis not present

## 2022-05-07 DIAGNOSIS — R7981 Abnormal blood-gas level: Secondary | ICD-10-CM | POA: Diagnosis not present

## 2022-05-08 NOTE — Progress Notes (Signed)
Remote ICD transmission.   

## 2022-05-14 DIAGNOSIS — R531 Weakness: Secondary | ICD-10-CM | POA: Diagnosis not present

## 2022-05-14 DIAGNOSIS — R634 Abnormal weight loss: Secondary | ICD-10-CM | POA: Diagnosis not present

## 2022-05-14 DIAGNOSIS — J189 Pneumonia, unspecified organism: Secondary | ICD-10-CM | POA: Diagnosis not present

## 2022-05-14 DIAGNOSIS — I10 Essential (primary) hypertension: Secondary | ICD-10-CM | POA: Diagnosis not present

## 2022-05-14 DIAGNOSIS — F03B18 Unspecified dementia, moderate, with other behavioral disturbance: Secondary | ICD-10-CM | POA: Diagnosis not present

## 2022-05-14 DIAGNOSIS — I4891 Unspecified atrial fibrillation: Secondary | ICD-10-CM | POA: Diagnosis not present

## 2022-05-14 DIAGNOSIS — R2689 Other abnormalities of gait and mobility: Secondary | ICD-10-CM | POA: Diagnosis not present

## 2022-05-14 DIAGNOSIS — I959 Hypotension, unspecified: Secondary | ICD-10-CM | POA: Diagnosis not present

## 2022-05-20 DIAGNOSIS — I252 Old myocardial infarction: Secondary | ICD-10-CM | POA: Diagnosis not present

## 2022-05-20 DIAGNOSIS — M1990 Unspecified osteoarthritis, unspecified site: Secondary | ICD-10-CM | POA: Diagnosis not present

## 2022-05-20 DIAGNOSIS — I2510 Atherosclerotic heart disease of native coronary artery without angina pectoris: Secondary | ICD-10-CM | POA: Diagnosis not present

## 2022-05-20 DIAGNOSIS — E039 Hypothyroidism, unspecified: Secondary | ICD-10-CM | POA: Diagnosis not present

## 2022-05-20 DIAGNOSIS — Z8616 Personal history of COVID-19: Secondary | ICD-10-CM | POA: Diagnosis not present

## 2022-05-20 DIAGNOSIS — I10 Essential (primary) hypertension: Secondary | ICD-10-CM | POA: Diagnosis not present

## 2022-05-20 DIAGNOSIS — R634 Abnormal weight loss: Secondary | ICD-10-CM | POA: Diagnosis not present

## 2022-05-20 DIAGNOSIS — Z6820 Body mass index (BMI) 20.0-20.9, adult: Secondary | ICD-10-CM | POA: Diagnosis not present

## 2022-05-20 DIAGNOSIS — F02818 Dementia in other diseases classified elsewhere, unspecified severity, with other behavioral disturbance: Secondary | ICD-10-CM | POA: Diagnosis not present

## 2022-05-20 DIAGNOSIS — I4891 Unspecified atrial fibrillation: Secondary | ICD-10-CM | POA: Diagnosis not present

## 2022-05-20 DIAGNOSIS — Z9049 Acquired absence of other specified parts of digestive tract: Secondary | ICD-10-CM | POA: Diagnosis not present

## 2022-05-20 DIAGNOSIS — Z95810 Presence of automatic (implantable) cardiac defibrillator: Secondary | ICD-10-CM | POA: Diagnosis not present

## 2022-05-20 DIAGNOSIS — F03B18 Unspecified dementia, moderate, with other behavioral disturbance: Secondary | ICD-10-CM | POA: Diagnosis not present

## 2022-05-20 DIAGNOSIS — J188 Other pneumonia, unspecified organism: Secondary | ICD-10-CM | POA: Diagnosis not present

## 2022-05-20 DIAGNOSIS — J189 Pneumonia, unspecified organism: Secondary | ICD-10-CM | POA: Diagnosis not present

## 2022-05-20 DIAGNOSIS — Z7982 Long term (current) use of aspirin: Secondary | ICD-10-CM | POA: Diagnosis not present

## 2022-05-20 DIAGNOSIS — I959 Hypotension, unspecified: Secondary | ICD-10-CM | POA: Diagnosis not present

## 2022-05-21 DIAGNOSIS — I959 Hypotension, unspecified: Secondary | ICD-10-CM | POA: Diagnosis not present

## 2022-05-21 DIAGNOSIS — E039 Hypothyroidism, unspecified: Secondary | ICD-10-CM | POA: Diagnosis not present

## 2022-05-21 DIAGNOSIS — F02818 Dementia in other diseases classified elsewhere, unspecified severity, with other behavioral disturbance: Secondary | ICD-10-CM | POA: Diagnosis not present

## 2022-05-21 DIAGNOSIS — J188 Other pneumonia, unspecified organism: Secondary | ICD-10-CM | POA: Diagnosis not present

## 2022-05-21 DIAGNOSIS — I2510 Atherosclerotic heart disease of native coronary artery without angina pectoris: Secondary | ICD-10-CM | POA: Diagnosis not present

## 2022-05-21 DIAGNOSIS — I10 Essential (primary) hypertension: Secondary | ICD-10-CM | POA: Diagnosis not present

## 2022-05-22 DIAGNOSIS — I10 Essential (primary) hypertension: Secondary | ICD-10-CM | POA: Diagnosis not present

## 2022-05-22 DIAGNOSIS — E039 Hypothyroidism, unspecified: Secondary | ICD-10-CM | POA: Diagnosis not present

## 2022-05-22 DIAGNOSIS — I2510 Atherosclerotic heart disease of native coronary artery without angina pectoris: Secondary | ICD-10-CM | POA: Diagnosis not present

## 2022-05-22 DIAGNOSIS — F02818 Dementia in other diseases classified elsewhere, unspecified severity, with other behavioral disturbance: Secondary | ICD-10-CM | POA: Diagnosis not present

## 2022-05-22 DIAGNOSIS — J188 Other pneumonia, unspecified organism: Secondary | ICD-10-CM | POA: Diagnosis not present

## 2022-05-22 DIAGNOSIS — I959 Hypotension, unspecified: Secondary | ICD-10-CM | POA: Diagnosis not present

## 2022-05-27 DIAGNOSIS — E039 Hypothyroidism, unspecified: Secondary | ICD-10-CM | POA: Diagnosis not present

## 2022-05-27 DIAGNOSIS — I959 Hypotension, unspecified: Secondary | ICD-10-CM | POA: Diagnosis not present

## 2022-05-27 DIAGNOSIS — F02818 Dementia in other diseases classified elsewhere, unspecified severity, with other behavioral disturbance: Secondary | ICD-10-CM | POA: Diagnosis not present

## 2022-05-27 DIAGNOSIS — I10 Essential (primary) hypertension: Secondary | ICD-10-CM | POA: Diagnosis not present

## 2022-05-27 DIAGNOSIS — J188 Other pneumonia, unspecified organism: Secondary | ICD-10-CM | POA: Diagnosis not present

## 2022-05-27 DIAGNOSIS — I2510 Atherosclerotic heart disease of native coronary artery without angina pectoris: Secondary | ICD-10-CM | POA: Diagnosis not present

## 2022-05-28 DIAGNOSIS — E039 Hypothyroidism, unspecified: Secondary | ICD-10-CM | POA: Diagnosis not present

## 2022-05-28 DIAGNOSIS — I130 Hypertensive heart and chronic kidney disease with heart failure and stage 1 through stage 4 chronic kidney disease, or unspecified chronic kidney disease: Secondary | ICD-10-CM | POA: Diagnosis not present

## 2022-05-28 DIAGNOSIS — F02818 Dementia in other diseases classified elsewhere, unspecified severity, with other behavioral disturbance: Secondary | ICD-10-CM | POA: Diagnosis not present

## 2022-05-28 DIAGNOSIS — I959 Hypotension, unspecified: Secondary | ICD-10-CM | POA: Diagnosis not present

## 2022-05-28 DIAGNOSIS — N183 Chronic kidney disease, stage 3 unspecified: Secondary | ICD-10-CM | POA: Diagnosis not present

## 2022-05-28 DIAGNOSIS — E785 Hyperlipidemia, unspecified: Secondary | ICD-10-CM | POA: Diagnosis not present

## 2022-05-28 DIAGNOSIS — I10 Essential (primary) hypertension: Secondary | ICD-10-CM | POA: Diagnosis not present

## 2022-05-28 DIAGNOSIS — I5022 Chronic systolic (congestive) heart failure: Secondary | ICD-10-CM | POA: Diagnosis not present

## 2022-05-28 DIAGNOSIS — J188 Other pneumonia, unspecified organism: Secondary | ICD-10-CM | POA: Diagnosis not present

## 2022-05-28 DIAGNOSIS — I2510 Atherosclerotic heart disease of native coronary artery without angina pectoris: Secondary | ICD-10-CM | POA: Diagnosis not present

## 2022-05-29 ENCOUNTER — Other Ambulatory Visit: Payer: Self-pay | Admitting: Neurology

## 2022-05-29 DIAGNOSIS — I959 Hypotension, unspecified: Secondary | ICD-10-CM | POA: Diagnosis not present

## 2022-05-29 DIAGNOSIS — F02818 Dementia in other diseases classified elsewhere, unspecified severity, with other behavioral disturbance: Secondary | ICD-10-CM | POA: Diagnosis not present

## 2022-05-29 DIAGNOSIS — I10 Essential (primary) hypertension: Secondary | ICD-10-CM | POA: Diagnosis not present

## 2022-05-29 DIAGNOSIS — E039 Hypothyroidism, unspecified: Secondary | ICD-10-CM | POA: Diagnosis not present

## 2022-05-29 DIAGNOSIS — I2510 Atherosclerotic heart disease of native coronary artery without angina pectoris: Secondary | ICD-10-CM | POA: Diagnosis not present

## 2022-05-29 DIAGNOSIS — J188 Other pneumonia, unspecified organism: Secondary | ICD-10-CM | POA: Diagnosis not present

## 2022-05-30 DIAGNOSIS — J188 Other pneumonia, unspecified organism: Secondary | ICD-10-CM | POA: Diagnosis not present

## 2022-05-30 DIAGNOSIS — E039 Hypothyroidism, unspecified: Secondary | ICD-10-CM | POA: Diagnosis not present

## 2022-05-30 DIAGNOSIS — I10 Essential (primary) hypertension: Secondary | ICD-10-CM | POA: Diagnosis not present

## 2022-05-30 DIAGNOSIS — I2510 Atherosclerotic heart disease of native coronary artery without angina pectoris: Secondary | ICD-10-CM | POA: Diagnosis not present

## 2022-05-30 DIAGNOSIS — F02818 Dementia in other diseases classified elsewhere, unspecified severity, with other behavioral disturbance: Secondary | ICD-10-CM | POA: Diagnosis not present

## 2022-05-30 DIAGNOSIS — I959 Hypotension, unspecified: Secondary | ICD-10-CM | POA: Diagnosis not present

## 2022-06-02 ENCOUNTER — Ambulatory Visit (INDEPENDENT_AMBULATORY_CARE_PROVIDER_SITE_OTHER): Payer: Medicare Other

## 2022-06-02 DIAGNOSIS — I472 Ventricular tachycardia, unspecified: Secondary | ICD-10-CM

## 2022-06-02 DIAGNOSIS — E039 Hypothyroidism, unspecified: Secondary | ICD-10-CM | POA: Diagnosis not present

## 2022-06-02 DIAGNOSIS — J188 Other pneumonia, unspecified organism: Secondary | ICD-10-CM | POA: Diagnosis not present

## 2022-06-02 DIAGNOSIS — I10 Essential (primary) hypertension: Secondary | ICD-10-CM | POA: Diagnosis not present

## 2022-06-02 DIAGNOSIS — I2510 Atherosclerotic heart disease of native coronary artery without angina pectoris: Secondary | ICD-10-CM | POA: Diagnosis not present

## 2022-06-02 DIAGNOSIS — F02818 Dementia in other diseases classified elsewhere, unspecified severity, with other behavioral disturbance: Secondary | ICD-10-CM | POA: Diagnosis not present

## 2022-06-02 DIAGNOSIS — I959 Hypotension, unspecified: Secondary | ICD-10-CM | POA: Diagnosis not present

## 2022-06-03 DIAGNOSIS — I2510 Atherosclerotic heart disease of native coronary artery without angina pectoris: Secondary | ICD-10-CM | POA: Diagnosis not present

## 2022-06-03 DIAGNOSIS — E039 Hypothyroidism, unspecified: Secondary | ICD-10-CM | POA: Diagnosis not present

## 2022-06-03 DIAGNOSIS — F02818 Dementia in other diseases classified elsewhere, unspecified severity, with other behavioral disturbance: Secondary | ICD-10-CM | POA: Diagnosis not present

## 2022-06-03 DIAGNOSIS — I10 Essential (primary) hypertension: Secondary | ICD-10-CM | POA: Diagnosis not present

## 2022-06-03 DIAGNOSIS — J188 Other pneumonia, unspecified organism: Secondary | ICD-10-CM | POA: Diagnosis not present

## 2022-06-03 DIAGNOSIS — I959 Hypotension, unspecified: Secondary | ICD-10-CM | POA: Diagnosis not present

## 2022-06-03 LAB — CUP PACEART REMOTE DEVICE CHECK
Battery Remaining Longevity: 2 mo
Battery Voltage: 2.79 V
Brady Statistic AP VP Percent: 3.11 %
Brady Statistic AP VS Percent: 64.12 %
Brady Statistic AS VP Percent: 2.39 %
Brady Statistic AS VS Percent: 30.38 %
Brady Statistic RA Percent Paced: 64.3 %
Brady Statistic RV Percent Paced: 5.32 %
Date Time Interrogation Session: 20230605001705
HighPow Impedance: 45 Ohm
HighPow Impedance: 63 Ohm
Implantable Lead Implant Date: 19990520
Implantable Lead Implant Date: 20011228
Implantable Lead Location: 753859
Implantable Lead Location: 753860
Implantable Lead Model: 6940
Implantable Lead Model: 6947
Implantable Pulse Generator Implant Date: 20140331
Lead Channel Impedance Value: 342 Ohm
Lead Channel Impedance Value: 399 Ohm
Lead Channel Impedance Value: 456 Ohm
Lead Channel Pacing Threshold Amplitude: 0.625 V
Lead Channel Pacing Threshold Amplitude: 1 V
Lead Channel Pacing Threshold Pulse Width: 0.4 ms
Lead Channel Pacing Threshold Pulse Width: 0.4 ms
Lead Channel Sensing Intrinsic Amplitude: 0.75 mV
Lead Channel Sensing Intrinsic Amplitude: 0.75 mV
Lead Channel Sensing Intrinsic Amplitude: 6.875 mV
Lead Channel Sensing Intrinsic Amplitude: 6.875 mV
Lead Channel Setting Pacing Amplitude: 2 V
Lead Channel Setting Pacing Amplitude: 2.5 V
Lead Channel Setting Pacing Pulse Width: 0.4 ms
Lead Channel Setting Sensing Sensitivity: 0.3 mV

## 2022-06-04 DIAGNOSIS — I4891 Unspecified atrial fibrillation: Secondary | ICD-10-CM | POA: Diagnosis not present

## 2022-06-04 DIAGNOSIS — F0390 Unspecified dementia without behavioral disturbance: Secondary | ICD-10-CM | POA: Diagnosis not present

## 2022-06-04 DIAGNOSIS — I2510 Atherosclerotic heart disease of native coronary artery without angina pectoris: Secondary | ICD-10-CM | POA: Diagnosis not present

## 2022-06-04 DIAGNOSIS — I255 Ischemic cardiomyopathy: Secondary | ICD-10-CM | POA: Diagnosis not present

## 2022-06-04 DIAGNOSIS — E039 Hypothyroidism, unspecified: Secondary | ICD-10-CM | POA: Diagnosis not present

## 2022-06-04 DIAGNOSIS — I472 Ventricular tachycardia, unspecified: Secondary | ICD-10-CM | POA: Diagnosis not present

## 2022-06-04 DIAGNOSIS — K4090 Unilateral inguinal hernia, without obstruction or gangrene, not specified as recurrent: Secondary | ICD-10-CM | POA: Diagnosis not present

## 2022-06-04 DIAGNOSIS — I5022 Chronic systolic (congestive) heart failure: Secondary | ICD-10-CM | POA: Diagnosis not present

## 2022-06-04 DIAGNOSIS — E785 Hyperlipidemia, unspecified: Secondary | ICD-10-CM | POA: Diagnosis not present

## 2022-06-05 ENCOUNTER — Other Ambulatory Visit: Payer: Self-pay | Admitting: Neurology

## 2022-06-09 DIAGNOSIS — I4891 Unspecified atrial fibrillation: Secondary | ICD-10-CM | POA: Diagnosis not present

## 2022-06-09 DIAGNOSIS — E039 Hypothyroidism, unspecified: Secondary | ICD-10-CM | POA: Diagnosis not present

## 2022-06-09 DIAGNOSIS — I472 Ventricular tachycardia, unspecified: Secondary | ICD-10-CM | POA: Diagnosis not present

## 2022-06-09 DIAGNOSIS — I5022 Chronic systolic (congestive) heart failure: Secondary | ICD-10-CM | POA: Diagnosis not present

## 2022-06-09 DIAGNOSIS — I255 Ischemic cardiomyopathy: Secondary | ICD-10-CM | POA: Diagnosis not present

## 2022-06-09 DIAGNOSIS — I2510 Atherosclerotic heart disease of native coronary artery without angina pectoris: Secondary | ICD-10-CM | POA: Diagnosis not present

## 2022-06-10 DIAGNOSIS — I472 Ventricular tachycardia, unspecified: Secondary | ICD-10-CM | POA: Diagnosis not present

## 2022-06-10 DIAGNOSIS — E039 Hypothyroidism, unspecified: Secondary | ICD-10-CM | POA: Diagnosis not present

## 2022-06-10 DIAGNOSIS — I255 Ischemic cardiomyopathy: Secondary | ICD-10-CM | POA: Diagnosis not present

## 2022-06-10 DIAGNOSIS — I2510 Atherosclerotic heart disease of native coronary artery without angina pectoris: Secondary | ICD-10-CM | POA: Diagnosis not present

## 2022-06-10 DIAGNOSIS — I5022 Chronic systolic (congestive) heart failure: Secondary | ICD-10-CM | POA: Diagnosis not present

## 2022-06-10 DIAGNOSIS — I4891 Unspecified atrial fibrillation: Secondary | ICD-10-CM | POA: Diagnosis not present

## 2022-06-11 DIAGNOSIS — I472 Ventricular tachycardia, unspecified: Secondary | ICD-10-CM | POA: Diagnosis not present

## 2022-06-11 DIAGNOSIS — I5022 Chronic systolic (congestive) heart failure: Secondary | ICD-10-CM | POA: Diagnosis not present

## 2022-06-11 DIAGNOSIS — E039 Hypothyroidism, unspecified: Secondary | ICD-10-CM | POA: Diagnosis not present

## 2022-06-11 DIAGNOSIS — I4891 Unspecified atrial fibrillation: Secondary | ICD-10-CM | POA: Diagnosis not present

## 2022-06-11 DIAGNOSIS — I255 Ischemic cardiomyopathy: Secondary | ICD-10-CM | POA: Diagnosis not present

## 2022-06-11 DIAGNOSIS — I2510 Atherosclerotic heart disease of native coronary artery without angina pectoris: Secondary | ICD-10-CM | POA: Diagnosis not present

## 2022-06-12 DIAGNOSIS — I2510 Atherosclerotic heart disease of native coronary artery without angina pectoris: Secondary | ICD-10-CM | POA: Diagnosis not present

## 2022-06-12 DIAGNOSIS — E039 Hypothyroidism, unspecified: Secondary | ICD-10-CM | POA: Diagnosis not present

## 2022-06-12 DIAGNOSIS — I4891 Unspecified atrial fibrillation: Secondary | ICD-10-CM | POA: Diagnosis not present

## 2022-06-12 DIAGNOSIS — I5022 Chronic systolic (congestive) heart failure: Secondary | ICD-10-CM | POA: Diagnosis not present

## 2022-06-12 DIAGNOSIS — I255 Ischemic cardiomyopathy: Secondary | ICD-10-CM | POA: Diagnosis not present

## 2022-06-12 DIAGNOSIS — I472 Ventricular tachycardia, unspecified: Secondary | ICD-10-CM | POA: Diagnosis not present

## 2022-06-13 DIAGNOSIS — I5022 Chronic systolic (congestive) heart failure: Secondary | ICD-10-CM | POA: Diagnosis not present

## 2022-06-13 DIAGNOSIS — I472 Ventricular tachycardia, unspecified: Secondary | ICD-10-CM | POA: Diagnosis not present

## 2022-06-13 DIAGNOSIS — E039 Hypothyroidism, unspecified: Secondary | ICD-10-CM | POA: Diagnosis not present

## 2022-06-13 DIAGNOSIS — I4891 Unspecified atrial fibrillation: Secondary | ICD-10-CM | POA: Diagnosis not present

## 2022-06-13 DIAGNOSIS — I255 Ischemic cardiomyopathy: Secondary | ICD-10-CM | POA: Diagnosis not present

## 2022-06-13 DIAGNOSIS — I2510 Atherosclerotic heart disease of native coronary artery without angina pectoris: Secondary | ICD-10-CM | POA: Diagnosis not present

## 2022-06-16 DIAGNOSIS — I255 Ischemic cardiomyopathy: Secondary | ICD-10-CM | POA: Diagnosis not present

## 2022-06-16 DIAGNOSIS — I5022 Chronic systolic (congestive) heart failure: Secondary | ICD-10-CM | POA: Diagnosis not present

## 2022-06-16 DIAGNOSIS — E039 Hypothyroidism, unspecified: Secondary | ICD-10-CM | POA: Diagnosis not present

## 2022-06-16 DIAGNOSIS — I472 Ventricular tachycardia, unspecified: Secondary | ICD-10-CM | POA: Diagnosis not present

## 2022-06-16 DIAGNOSIS — I4891 Unspecified atrial fibrillation: Secondary | ICD-10-CM | POA: Diagnosis not present

## 2022-06-16 DIAGNOSIS — I2510 Atherosclerotic heart disease of native coronary artery without angina pectoris: Secondary | ICD-10-CM | POA: Diagnosis not present

## 2022-06-17 DIAGNOSIS — I255 Ischemic cardiomyopathy: Secondary | ICD-10-CM | POA: Diagnosis not present

## 2022-06-17 DIAGNOSIS — I4891 Unspecified atrial fibrillation: Secondary | ICD-10-CM | POA: Diagnosis not present

## 2022-06-17 DIAGNOSIS — E039 Hypothyroidism, unspecified: Secondary | ICD-10-CM | POA: Diagnosis not present

## 2022-06-17 DIAGNOSIS — I2510 Atherosclerotic heart disease of native coronary artery without angina pectoris: Secondary | ICD-10-CM | POA: Diagnosis not present

## 2022-06-17 DIAGNOSIS — I472 Ventricular tachycardia, unspecified: Secondary | ICD-10-CM | POA: Diagnosis not present

## 2022-06-17 DIAGNOSIS — I5022 Chronic systolic (congestive) heart failure: Secondary | ICD-10-CM | POA: Diagnosis not present

## 2022-06-17 NOTE — Progress Notes (Signed)
Remote ICD transmission.   

## 2022-06-17 NOTE — Addendum Note (Signed)
Addended by: Douglass Rivers D on: 06/17/2022 01:57 PM   Modules accepted: Level of Service

## 2022-06-19 DIAGNOSIS — I2510 Atherosclerotic heart disease of native coronary artery without angina pectoris: Secondary | ICD-10-CM | POA: Diagnosis not present

## 2022-06-19 DIAGNOSIS — I4891 Unspecified atrial fibrillation: Secondary | ICD-10-CM | POA: Diagnosis not present

## 2022-06-19 DIAGNOSIS — I255 Ischemic cardiomyopathy: Secondary | ICD-10-CM | POA: Diagnosis not present

## 2022-06-19 DIAGNOSIS — E039 Hypothyroidism, unspecified: Secondary | ICD-10-CM | POA: Diagnosis not present

## 2022-06-19 DIAGNOSIS — I472 Ventricular tachycardia, unspecified: Secondary | ICD-10-CM | POA: Diagnosis not present

## 2022-06-19 DIAGNOSIS — I5022 Chronic systolic (congestive) heart failure: Secondary | ICD-10-CM | POA: Diagnosis not present

## 2022-06-20 DIAGNOSIS — I5022 Chronic systolic (congestive) heart failure: Secondary | ICD-10-CM | POA: Diagnosis not present

## 2022-06-20 DIAGNOSIS — I472 Ventricular tachycardia, unspecified: Secondary | ICD-10-CM | POA: Diagnosis not present

## 2022-06-20 DIAGNOSIS — E039 Hypothyroidism, unspecified: Secondary | ICD-10-CM | POA: Diagnosis not present

## 2022-06-20 DIAGNOSIS — I4891 Unspecified atrial fibrillation: Secondary | ICD-10-CM | POA: Diagnosis not present

## 2022-06-20 DIAGNOSIS — I255 Ischemic cardiomyopathy: Secondary | ICD-10-CM | POA: Diagnosis not present

## 2022-06-20 DIAGNOSIS — I2510 Atherosclerotic heart disease of native coronary artery without angina pectoris: Secondary | ICD-10-CM | POA: Diagnosis not present

## 2022-06-23 DIAGNOSIS — I255 Ischemic cardiomyopathy: Secondary | ICD-10-CM | POA: Diagnosis not present

## 2022-06-23 DIAGNOSIS — I472 Ventricular tachycardia, unspecified: Secondary | ICD-10-CM | POA: Diagnosis not present

## 2022-06-23 DIAGNOSIS — I5022 Chronic systolic (congestive) heart failure: Secondary | ICD-10-CM | POA: Diagnosis not present

## 2022-06-23 DIAGNOSIS — E039 Hypothyroidism, unspecified: Secondary | ICD-10-CM | POA: Diagnosis not present

## 2022-06-23 DIAGNOSIS — I4891 Unspecified atrial fibrillation: Secondary | ICD-10-CM | POA: Diagnosis not present

## 2022-06-23 DIAGNOSIS — I2510 Atherosclerotic heart disease of native coronary artery without angina pectoris: Secondary | ICD-10-CM | POA: Diagnosis not present

## 2022-06-24 DIAGNOSIS — I255 Ischemic cardiomyopathy: Secondary | ICD-10-CM | POA: Diagnosis not present

## 2022-06-24 DIAGNOSIS — I4891 Unspecified atrial fibrillation: Secondary | ICD-10-CM | POA: Diagnosis not present

## 2022-06-24 DIAGNOSIS — I2510 Atherosclerotic heart disease of native coronary artery without angina pectoris: Secondary | ICD-10-CM | POA: Diagnosis not present

## 2022-06-24 DIAGNOSIS — I5022 Chronic systolic (congestive) heart failure: Secondary | ICD-10-CM | POA: Diagnosis not present

## 2022-06-24 DIAGNOSIS — I472 Ventricular tachycardia, unspecified: Secondary | ICD-10-CM | POA: Diagnosis not present

## 2022-06-24 DIAGNOSIS — E039 Hypothyroidism, unspecified: Secondary | ICD-10-CM | POA: Diagnosis not present

## 2022-06-25 DIAGNOSIS — I255 Ischemic cardiomyopathy: Secondary | ICD-10-CM | POA: Diagnosis not present

## 2022-06-25 DIAGNOSIS — E039 Hypothyroidism, unspecified: Secondary | ICD-10-CM | POA: Diagnosis not present

## 2022-06-25 DIAGNOSIS — I472 Ventricular tachycardia, unspecified: Secondary | ICD-10-CM | POA: Diagnosis not present

## 2022-06-25 DIAGNOSIS — I5022 Chronic systolic (congestive) heart failure: Secondary | ICD-10-CM | POA: Diagnosis not present

## 2022-06-25 DIAGNOSIS — I4891 Unspecified atrial fibrillation: Secondary | ICD-10-CM | POA: Diagnosis not present

## 2022-06-25 DIAGNOSIS — I2510 Atherosclerotic heart disease of native coronary artery without angina pectoris: Secondary | ICD-10-CM | POA: Diagnosis not present

## 2022-06-26 DIAGNOSIS — I255 Ischemic cardiomyopathy: Secondary | ICD-10-CM | POA: Diagnosis not present

## 2022-06-26 DIAGNOSIS — I4891 Unspecified atrial fibrillation: Secondary | ICD-10-CM | POA: Diagnosis not present

## 2022-06-26 DIAGNOSIS — I5022 Chronic systolic (congestive) heart failure: Secondary | ICD-10-CM | POA: Diagnosis not present

## 2022-06-26 DIAGNOSIS — E039 Hypothyroidism, unspecified: Secondary | ICD-10-CM | POA: Diagnosis not present

## 2022-06-26 DIAGNOSIS — I2510 Atherosclerotic heart disease of native coronary artery without angina pectoris: Secondary | ICD-10-CM | POA: Diagnosis not present

## 2022-06-26 DIAGNOSIS — I472 Ventricular tachycardia, unspecified: Secondary | ICD-10-CM | POA: Diagnosis not present

## 2022-06-27 DIAGNOSIS — E785 Hyperlipidemia, unspecified: Secondary | ICD-10-CM | POA: Diagnosis not present

## 2022-06-27 DIAGNOSIS — I130 Hypertensive heart and chronic kidney disease with heart failure and stage 1 through stage 4 chronic kidney disease, or unspecified chronic kidney disease: Secondary | ICD-10-CM | POA: Diagnosis not present

## 2022-06-27 DIAGNOSIS — I5022 Chronic systolic (congestive) heart failure: Secondary | ICD-10-CM | POA: Diagnosis not present

## 2022-06-27 DIAGNOSIS — N183 Chronic kidney disease, stage 3 unspecified: Secondary | ICD-10-CM | POA: Diagnosis not present

## 2022-06-28 DIAGNOSIS — F0390 Unspecified dementia without behavioral disturbance: Secondary | ICD-10-CM | POA: Diagnosis not present

## 2022-06-28 DIAGNOSIS — I2510 Atherosclerotic heart disease of native coronary artery without angina pectoris: Secondary | ICD-10-CM | POA: Diagnosis not present

## 2022-06-28 DIAGNOSIS — I255 Ischemic cardiomyopathy: Secondary | ICD-10-CM | POA: Diagnosis not present

## 2022-06-28 DIAGNOSIS — E039 Hypothyroidism, unspecified: Secondary | ICD-10-CM | POA: Diagnosis not present

## 2022-06-28 DIAGNOSIS — K4090 Unilateral inguinal hernia, without obstruction or gangrene, not specified as recurrent: Secondary | ICD-10-CM | POA: Diagnosis not present

## 2022-06-28 DIAGNOSIS — I5022 Chronic systolic (congestive) heart failure: Secondary | ICD-10-CM | POA: Diagnosis not present

## 2022-06-28 DIAGNOSIS — I4891 Unspecified atrial fibrillation: Secondary | ICD-10-CM | POA: Diagnosis not present

## 2022-06-28 DIAGNOSIS — I472 Ventricular tachycardia, unspecified: Secondary | ICD-10-CM | POA: Diagnosis not present

## 2022-06-28 DIAGNOSIS — E785 Hyperlipidemia, unspecified: Secondary | ICD-10-CM | POA: Diagnosis not present

## 2022-06-30 DIAGNOSIS — E039 Hypothyroidism, unspecified: Secondary | ICD-10-CM | POA: Diagnosis not present

## 2022-06-30 DIAGNOSIS — I2510 Atherosclerotic heart disease of native coronary artery without angina pectoris: Secondary | ICD-10-CM | POA: Diagnosis not present

## 2022-06-30 DIAGNOSIS — I255 Ischemic cardiomyopathy: Secondary | ICD-10-CM | POA: Diagnosis not present

## 2022-06-30 DIAGNOSIS — I5022 Chronic systolic (congestive) heart failure: Secondary | ICD-10-CM | POA: Diagnosis not present

## 2022-06-30 DIAGNOSIS — I472 Ventricular tachycardia, unspecified: Secondary | ICD-10-CM | POA: Diagnosis not present

## 2022-06-30 DIAGNOSIS — I4891 Unspecified atrial fibrillation: Secondary | ICD-10-CM | POA: Diagnosis not present

## 2022-07-02 DIAGNOSIS — I2510 Atherosclerotic heart disease of native coronary artery without angina pectoris: Secondary | ICD-10-CM | POA: Diagnosis not present

## 2022-07-02 DIAGNOSIS — I5022 Chronic systolic (congestive) heart failure: Secondary | ICD-10-CM | POA: Diagnosis not present

## 2022-07-02 DIAGNOSIS — I472 Ventricular tachycardia, unspecified: Secondary | ICD-10-CM | POA: Diagnosis not present

## 2022-07-02 DIAGNOSIS — E039 Hypothyroidism, unspecified: Secondary | ICD-10-CM | POA: Diagnosis not present

## 2022-07-02 DIAGNOSIS — I255 Ischemic cardiomyopathy: Secondary | ICD-10-CM | POA: Diagnosis not present

## 2022-07-02 DIAGNOSIS — I4891 Unspecified atrial fibrillation: Secondary | ICD-10-CM | POA: Diagnosis not present

## 2022-07-03 DIAGNOSIS — E039 Hypothyroidism, unspecified: Secondary | ICD-10-CM | POA: Diagnosis not present

## 2022-07-03 DIAGNOSIS — I5022 Chronic systolic (congestive) heart failure: Secondary | ICD-10-CM | POA: Diagnosis not present

## 2022-07-03 DIAGNOSIS — I4891 Unspecified atrial fibrillation: Secondary | ICD-10-CM | POA: Diagnosis not present

## 2022-07-03 DIAGNOSIS — I2510 Atherosclerotic heart disease of native coronary artery without angina pectoris: Secondary | ICD-10-CM | POA: Diagnosis not present

## 2022-07-03 DIAGNOSIS — I255 Ischemic cardiomyopathy: Secondary | ICD-10-CM | POA: Diagnosis not present

## 2022-07-03 DIAGNOSIS — I472 Ventricular tachycardia, unspecified: Secondary | ICD-10-CM | POA: Diagnosis not present

## 2022-07-07 ENCOUNTER — Ambulatory Visit (INDEPENDENT_AMBULATORY_CARE_PROVIDER_SITE_OTHER): Payer: Medicare Other

## 2022-07-07 DIAGNOSIS — I5022 Chronic systolic (congestive) heart failure: Secondary | ICD-10-CM | POA: Diagnosis not present

## 2022-07-07 DIAGNOSIS — E039 Hypothyroidism, unspecified: Secondary | ICD-10-CM | POA: Diagnosis not present

## 2022-07-07 DIAGNOSIS — I472 Ventricular tachycardia, unspecified: Secondary | ICD-10-CM | POA: Diagnosis not present

## 2022-07-07 DIAGNOSIS — I4891 Unspecified atrial fibrillation: Secondary | ICD-10-CM | POA: Diagnosis not present

## 2022-07-07 DIAGNOSIS — I2510 Atherosclerotic heart disease of native coronary artery without angina pectoris: Secondary | ICD-10-CM | POA: Diagnosis not present

## 2022-07-07 DIAGNOSIS — I255 Ischemic cardiomyopathy: Secondary | ICD-10-CM | POA: Diagnosis not present

## 2022-07-08 ENCOUNTER — Other Ambulatory Visit: Payer: Self-pay | Admitting: Neurology

## 2022-07-08 DIAGNOSIS — E039 Hypothyroidism, unspecified: Secondary | ICD-10-CM | POA: Diagnosis not present

## 2022-07-08 DIAGNOSIS — I2510 Atherosclerotic heart disease of native coronary artery without angina pectoris: Secondary | ICD-10-CM | POA: Diagnosis not present

## 2022-07-08 DIAGNOSIS — I5022 Chronic systolic (congestive) heart failure: Secondary | ICD-10-CM | POA: Diagnosis not present

## 2022-07-08 DIAGNOSIS — I472 Ventricular tachycardia, unspecified: Secondary | ICD-10-CM | POA: Diagnosis not present

## 2022-07-08 DIAGNOSIS — I4891 Unspecified atrial fibrillation: Secondary | ICD-10-CM | POA: Diagnosis not present

## 2022-07-08 DIAGNOSIS — I255 Ischemic cardiomyopathy: Secondary | ICD-10-CM | POA: Diagnosis not present

## 2022-07-08 LAB — CUP PACEART REMOTE DEVICE CHECK
Battery Remaining Longevity: 2 mo
Battery Voltage: 2.77 V
Brady Statistic AP VP Percent: 0.33 %
Brady Statistic AP VS Percent: 30.03 %
Brady Statistic AS VP Percent: 0.57 %
Brady Statistic AS VS Percent: 69.07 %
Brady Statistic RA Percent Paced: 30.04 %
Brady Statistic RV Percent Paced: 0.85 %
Date Time Interrogation Session: 20230710033524
HighPow Impedance: 45 Ohm
HighPow Impedance: 64 Ohm
Implantable Lead Implant Date: 19990520
Implantable Lead Implant Date: 20011228
Implantable Lead Location: 753859
Implantable Lead Location: 753860
Implantable Lead Model: 6940
Implantable Lead Model: 6947
Implantable Pulse Generator Implant Date: 20140331
Lead Channel Impedance Value: 342 Ohm
Lead Channel Impedance Value: 437 Ohm
Lead Channel Impedance Value: 494 Ohm
Lead Channel Pacing Threshold Amplitude: 1 V
Lead Channel Pacing Threshold Amplitude: 1.125 V
Lead Channel Pacing Threshold Pulse Width: 0.4 ms
Lead Channel Pacing Threshold Pulse Width: 0.4 ms
Lead Channel Sensing Intrinsic Amplitude: 1.125 mV
Lead Channel Sensing Intrinsic Amplitude: 1.125 mV
Lead Channel Sensing Intrinsic Amplitude: 4.125 mV
Lead Channel Sensing Intrinsic Amplitude: 4.125 mV
Lead Channel Setting Pacing Amplitude: 2 V
Lead Channel Setting Pacing Amplitude: 2.75 V
Lead Channel Setting Pacing Pulse Width: 0.4 ms
Lead Channel Setting Sensing Sensitivity: 0.3 mV

## 2022-07-09 ENCOUNTER — Other Ambulatory Visit: Payer: Self-pay | Admitting: Neurology

## 2022-07-09 DIAGNOSIS — I2510 Atherosclerotic heart disease of native coronary artery without angina pectoris: Secondary | ICD-10-CM | POA: Diagnosis not present

## 2022-07-09 DIAGNOSIS — I472 Ventricular tachycardia, unspecified: Secondary | ICD-10-CM | POA: Diagnosis not present

## 2022-07-09 DIAGNOSIS — I4891 Unspecified atrial fibrillation: Secondary | ICD-10-CM | POA: Diagnosis not present

## 2022-07-09 DIAGNOSIS — E039 Hypothyroidism, unspecified: Secondary | ICD-10-CM | POA: Diagnosis not present

## 2022-07-09 DIAGNOSIS — I255 Ischemic cardiomyopathy: Secondary | ICD-10-CM | POA: Diagnosis not present

## 2022-07-09 DIAGNOSIS — I5022 Chronic systolic (congestive) heart failure: Secondary | ICD-10-CM | POA: Diagnosis not present

## 2022-07-10 DIAGNOSIS — I5022 Chronic systolic (congestive) heart failure: Secondary | ICD-10-CM | POA: Diagnosis not present

## 2022-07-10 DIAGNOSIS — I4891 Unspecified atrial fibrillation: Secondary | ICD-10-CM | POA: Diagnosis not present

## 2022-07-10 DIAGNOSIS — I255 Ischemic cardiomyopathy: Secondary | ICD-10-CM | POA: Diagnosis not present

## 2022-07-10 DIAGNOSIS — I2510 Atherosclerotic heart disease of native coronary artery without angina pectoris: Secondary | ICD-10-CM | POA: Diagnosis not present

## 2022-07-10 DIAGNOSIS — E039 Hypothyroidism, unspecified: Secondary | ICD-10-CM | POA: Diagnosis not present

## 2022-07-10 DIAGNOSIS — I472 Ventricular tachycardia, unspecified: Secondary | ICD-10-CM | POA: Diagnosis not present

## 2022-07-11 DIAGNOSIS — E039 Hypothyroidism, unspecified: Secondary | ICD-10-CM | POA: Diagnosis not present

## 2022-07-11 DIAGNOSIS — I472 Ventricular tachycardia, unspecified: Secondary | ICD-10-CM | POA: Diagnosis not present

## 2022-07-11 DIAGNOSIS — I2510 Atherosclerotic heart disease of native coronary artery without angina pectoris: Secondary | ICD-10-CM | POA: Diagnosis not present

## 2022-07-11 DIAGNOSIS — I4891 Unspecified atrial fibrillation: Secondary | ICD-10-CM | POA: Diagnosis not present

## 2022-07-11 DIAGNOSIS — I5022 Chronic systolic (congestive) heart failure: Secondary | ICD-10-CM | POA: Diagnosis not present

## 2022-07-11 DIAGNOSIS — I255 Ischemic cardiomyopathy: Secondary | ICD-10-CM | POA: Diagnosis not present

## 2022-07-15 DIAGNOSIS — I4891 Unspecified atrial fibrillation: Secondary | ICD-10-CM | POA: Diagnosis not present

## 2022-07-15 DIAGNOSIS — E039 Hypothyroidism, unspecified: Secondary | ICD-10-CM | POA: Diagnosis not present

## 2022-07-15 DIAGNOSIS — I2510 Atherosclerotic heart disease of native coronary artery without angina pectoris: Secondary | ICD-10-CM | POA: Diagnosis not present

## 2022-07-15 DIAGNOSIS — I5022 Chronic systolic (congestive) heart failure: Secondary | ICD-10-CM | POA: Diagnosis not present

## 2022-07-15 DIAGNOSIS — I255 Ischemic cardiomyopathy: Secondary | ICD-10-CM | POA: Diagnosis not present

## 2022-07-15 DIAGNOSIS — I472 Ventricular tachycardia, unspecified: Secondary | ICD-10-CM | POA: Diagnosis not present

## 2022-07-16 DIAGNOSIS — I2510 Atherosclerotic heart disease of native coronary artery without angina pectoris: Secondary | ICD-10-CM | POA: Diagnosis not present

## 2022-07-16 DIAGNOSIS — I4891 Unspecified atrial fibrillation: Secondary | ICD-10-CM | POA: Diagnosis not present

## 2022-07-16 DIAGNOSIS — I472 Ventricular tachycardia, unspecified: Secondary | ICD-10-CM | POA: Diagnosis not present

## 2022-07-16 DIAGNOSIS — I255 Ischemic cardiomyopathy: Secondary | ICD-10-CM | POA: Diagnosis not present

## 2022-07-16 DIAGNOSIS — E039 Hypothyroidism, unspecified: Secondary | ICD-10-CM | POA: Diagnosis not present

## 2022-07-16 DIAGNOSIS — I5022 Chronic systolic (congestive) heart failure: Secondary | ICD-10-CM | POA: Diagnosis not present

## 2022-07-17 DIAGNOSIS — I255 Ischemic cardiomyopathy: Secondary | ICD-10-CM | POA: Diagnosis not present

## 2022-07-17 DIAGNOSIS — I4891 Unspecified atrial fibrillation: Secondary | ICD-10-CM | POA: Diagnosis not present

## 2022-07-17 DIAGNOSIS — I2510 Atherosclerotic heart disease of native coronary artery without angina pectoris: Secondary | ICD-10-CM | POA: Diagnosis not present

## 2022-07-17 DIAGNOSIS — I5022 Chronic systolic (congestive) heart failure: Secondary | ICD-10-CM | POA: Diagnosis not present

## 2022-07-17 DIAGNOSIS — E039 Hypothyroidism, unspecified: Secondary | ICD-10-CM | POA: Diagnosis not present

## 2022-07-17 DIAGNOSIS — I472 Ventricular tachycardia, unspecified: Secondary | ICD-10-CM | POA: Diagnosis not present

## 2022-07-18 DIAGNOSIS — E039 Hypothyroidism, unspecified: Secondary | ICD-10-CM | POA: Diagnosis not present

## 2022-07-18 DIAGNOSIS — I255 Ischemic cardiomyopathy: Secondary | ICD-10-CM | POA: Diagnosis not present

## 2022-07-18 DIAGNOSIS — I472 Ventricular tachycardia, unspecified: Secondary | ICD-10-CM | POA: Diagnosis not present

## 2022-07-18 DIAGNOSIS — I5022 Chronic systolic (congestive) heart failure: Secondary | ICD-10-CM | POA: Diagnosis not present

## 2022-07-18 DIAGNOSIS — I2510 Atherosclerotic heart disease of native coronary artery without angina pectoris: Secondary | ICD-10-CM | POA: Diagnosis not present

## 2022-07-18 DIAGNOSIS — I4891 Unspecified atrial fibrillation: Secondary | ICD-10-CM | POA: Diagnosis not present

## 2022-07-22 DIAGNOSIS — E039 Hypothyroidism, unspecified: Secondary | ICD-10-CM | POA: Diagnosis not present

## 2022-07-22 DIAGNOSIS — I2510 Atherosclerotic heart disease of native coronary artery without angina pectoris: Secondary | ICD-10-CM | POA: Diagnosis not present

## 2022-07-22 DIAGNOSIS — I472 Ventricular tachycardia, unspecified: Secondary | ICD-10-CM | POA: Diagnosis not present

## 2022-07-22 DIAGNOSIS — I5022 Chronic systolic (congestive) heart failure: Secondary | ICD-10-CM | POA: Diagnosis not present

## 2022-07-22 DIAGNOSIS — I255 Ischemic cardiomyopathy: Secondary | ICD-10-CM | POA: Diagnosis not present

## 2022-07-22 DIAGNOSIS — I4891 Unspecified atrial fibrillation: Secondary | ICD-10-CM | POA: Diagnosis not present

## 2022-07-23 DIAGNOSIS — E039 Hypothyroidism, unspecified: Secondary | ICD-10-CM | POA: Diagnosis not present

## 2022-07-23 DIAGNOSIS — I4891 Unspecified atrial fibrillation: Secondary | ICD-10-CM | POA: Diagnosis not present

## 2022-07-23 DIAGNOSIS — I472 Ventricular tachycardia, unspecified: Secondary | ICD-10-CM | POA: Diagnosis not present

## 2022-07-23 DIAGNOSIS — I255 Ischemic cardiomyopathy: Secondary | ICD-10-CM | POA: Diagnosis not present

## 2022-07-23 DIAGNOSIS — I5022 Chronic systolic (congestive) heart failure: Secondary | ICD-10-CM | POA: Diagnosis not present

## 2022-07-23 DIAGNOSIS — I2510 Atherosclerotic heart disease of native coronary artery without angina pectoris: Secondary | ICD-10-CM | POA: Diagnosis not present

## 2022-07-24 DIAGNOSIS — I255 Ischemic cardiomyopathy: Secondary | ICD-10-CM | POA: Diagnosis not present

## 2022-07-24 DIAGNOSIS — E039 Hypothyroidism, unspecified: Secondary | ICD-10-CM | POA: Diagnosis not present

## 2022-07-24 DIAGNOSIS — I4891 Unspecified atrial fibrillation: Secondary | ICD-10-CM | POA: Diagnosis not present

## 2022-07-24 DIAGNOSIS — I472 Ventricular tachycardia, unspecified: Secondary | ICD-10-CM | POA: Diagnosis not present

## 2022-07-24 DIAGNOSIS — I2510 Atherosclerotic heart disease of native coronary artery without angina pectoris: Secondary | ICD-10-CM | POA: Diagnosis not present

## 2022-07-24 DIAGNOSIS — I5022 Chronic systolic (congestive) heart failure: Secondary | ICD-10-CM | POA: Diagnosis not present

## 2022-07-28 DIAGNOSIS — I4891 Unspecified atrial fibrillation: Secondary | ICD-10-CM | POA: Diagnosis not present

## 2022-07-28 DIAGNOSIS — I472 Ventricular tachycardia, unspecified: Secondary | ICD-10-CM | POA: Diagnosis not present

## 2022-07-28 DIAGNOSIS — I2510 Atherosclerotic heart disease of native coronary artery without angina pectoris: Secondary | ICD-10-CM | POA: Diagnosis not present

## 2022-07-28 DIAGNOSIS — I255 Ischemic cardiomyopathy: Secondary | ICD-10-CM | POA: Diagnosis not present

## 2022-07-28 DIAGNOSIS — E039 Hypothyroidism, unspecified: Secondary | ICD-10-CM | POA: Diagnosis not present

## 2022-07-28 DIAGNOSIS — I5022 Chronic systolic (congestive) heart failure: Secondary | ICD-10-CM | POA: Diagnosis not present

## 2022-07-29 DIAGNOSIS — K4090 Unilateral inguinal hernia, without obstruction or gangrene, not specified as recurrent: Secondary | ICD-10-CM | POA: Diagnosis not present

## 2022-07-29 DIAGNOSIS — E785 Hyperlipidemia, unspecified: Secondary | ICD-10-CM | POA: Diagnosis not present

## 2022-07-29 DIAGNOSIS — I2510 Atherosclerotic heart disease of native coronary artery without angina pectoris: Secondary | ICD-10-CM | POA: Diagnosis not present

## 2022-07-29 DIAGNOSIS — I255 Ischemic cardiomyopathy: Secondary | ICD-10-CM | POA: Diagnosis not present

## 2022-07-29 DIAGNOSIS — I5022 Chronic systolic (congestive) heart failure: Secondary | ICD-10-CM | POA: Diagnosis not present

## 2022-07-29 DIAGNOSIS — I4891 Unspecified atrial fibrillation: Secondary | ICD-10-CM | POA: Diagnosis not present

## 2022-07-29 DIAGNOSIS — E039 Hypothyroidism, unspecified: Secondary | ICD-10-CM | POA: Diagnosis not present

## 2022-07-29 DIAGNOSIS — F0390 Unspecified dementia without behavioral disturbance: Secondary | ICD-10-CM | POA: Diagnosis not present

## 2022-07-29 DIAGNOSIS — I472 Ventricular tachycardia, unspecified: Secondary | ICD-10-CM | POA: Diagnosis not present

## 2022-07-29 NOTE — Addendum Note (Signed)
Addended by: Douglass Rivers D on: 07/29/2022 11:28 AM   Modules accepted: Level of Service

## 2022-07-29 NOTE — Progress Notes (Signed)
Remote ICD transmission.   

## 2022-07-30 DIAGNOSIS — I472 Ventricular tachycardia, unspecified: Secondary | ICD-10-CM | POA: Diagnosis not present

## 2022-07-30 DIAGNOSIS — E039 Hypothyroidism, unspecified: Secondary | ICD-10-CM | POA: Diagnosis not present

## 2022-07-30 DIAGNOSIS — I255 Ischemic cardiomyopathy: Secondary | ICD-10-CM | POA: Diagnosis not present

## 2022-07-30 DIAGNOSIS — I2510 Atherosclerotic heart disease of native coronary artery without angina pectoris: Secondary | ICD-10-CM | POA: Diagnosis not present

## 2022-07-30 DIAGNOSIS — I5022 Chronic systolic (congestive) heart failure: Secondary | ICD-10-CM | POA: Diagnosis not present

## 2022-07-30 DIAGNOSIS — I4891 Unspecified atrial fibrillation: Secondary | ICD-10-CM | POA: Diagnosis not present

## 2022-08-01 DIAGNOSIS — I5022 Chronic systolic (congestive) heart failure: Secondary | ICD-10-CM | POA: Diagnosis not present

## 2022-08-01 DIAGNOSIS — E039 Hypothyroidism, unspecified: Secondary | ICD-10-CM | POA: Diagnosis not present

## 2022-08-01 DIAGNOSIS — I2510 Atherosclerotic heart disease of native coronary artery without angina pectoris: Secondary | ICD-10-CM | POA: Diagnosis not present

## 2022-08-01 DIAGNOSIS — I255 Ischemic cardiomyopathy: Secondary | ICD-10-CM | POA: Diagnosis not present

## 2022-08-01 DIAGNOSIS — I472 Ventricular tachycardia, unspecified: Secondary | ICD-10-CM | POA: Diagnosis not present

## 2022-08-01 DIAGNOSIS — I4891 Unspecified atrial fibrillation: Secondary | ICD-10-CM | POA: Diagnosis not present

## 2022-08-03 DIAGNOSIS — I255 Ischemic cardiomyopathy: Secondary | ICD-10-CM | POA: Diagnosis not present

## 2022-08-03 DIAGNOSIS — I2510 Atherosclerotic heart disease of native coronary artery without angina pectoris: Secondary | ICD-10-CM | POA: Diagnosis not present

## 2022-08-03 DIAGNOSIS — I472 Ventricular tachycardia, unspecified: Secondary | ICD-10-CM | POA: Diagnosis not present

## 2022-08-03 DIAGNOSIS — I5022 Chronic systolic (congestive) heart failure: Secondary | ICD-10-CM | POA: Diagnosis not present

## 2022-08-03 DIAGNOSIS — E039 Hypothyroidism, unspecified: Secondary | ICD-10-CM | POA: Diagnosis not present

## 2022-08-03 DIAGNOSIS — I4891 Unspecified atrial fibrillation: Secondary | ICD-10-CM | POA: Diagnosis not present

## 2022-08-04 DIAGNOSIS — E039 Hypothyroidism, unspecified: Secondary | ICD-10-CM | POA: Diagnosis not present

## 2022-08-04 DIAGNOSIS — I472 Ventricular tachycardia, unspecified: Secondary | ICD-10-CM | POA: Diagnosis not present

## 2022-08-04 DIAGNOSIS — I5022 Chronic systolic (congestive) heart failure: Secondary | ICD-10-CM | POA: Diagnosis not present

## 2022-08-04 DIAGNOSIS — I4891 Unspecified atrial fibrillation: Secondary | ICD-10-CM | POA: Diagnosis not present

## 2022-08-04 DIAGNOSIS — I255 Ischemic cardiomyopathy: Secondary | ICD-10-CM | POA: Diagnosis not present

## 2022-08-04 DIAGNOSIS — I2510 Atherosclerotic heart disease of native coronary artery without angina pectoris: Secondary | ICD-10-CM | POA: Diagnosis not present

## 2022-08-06 DIAGNOSIS — I2510 Atherosclerotic heart disease of native coronary artery without angina pectoris: Secondary | ICD-10-CM | POA: Diagnosis not present

## 2022-08-06 DIAGNOSIS — I472 Ventricular tachycardia, unspecified: Secondary | ICD-10-CM | POA: Diagnosis not present

## 2022-08-06 DIAGNOSIS — I5022 Chronic systolic (congestive) heart failure: Secondary | ICD-10-CM | POA: Diagnosis not present

## 2022-08-06 DIAGNOSIS — I255 Ischemic cardiomyopathy: Secondary | ICD-10-CM | POA: Diagnosis not present

## 2022-08-06 DIAGNOSIS — E039 Hypothyroidism, unspecified: Secondary | ICD-10-CM | POA: Diagnosis not present

## 2022-08-06 DIAGNOSIS — I4891 Unspecified atrial fibrillation: Secondary | ICD-10-CM | POA: Diagnosis not present

## 2022-08-07 DIAGNOSIS — I5022 Chronic systolic (congestive) heart failure: Secondary | ICD-10-CM | POA: Diagnosis not present

## 2022-08-07 DIAGNOSIS — E039 Hypothyroidism, unspecified: Secondary | ICD-10-CM | POA: Diagnosis not present

## 2022-08-07 DIAGNOSIS — I255 Ischemic cardiomyopathy: Secondary | ICD-10-CM | POA: Diagnosis not present

## 2022-08-07 DIAGNOSIS — I2510 Atherosclerotic heart disease of native coronary artery without angina pectoris: Secondary | ICD-10-CM | POA: Diagnosis not present

## 2022-08-07 DIAGNOSIS — I472 Ventricular tachycardia, unspecified: Secondary | ICD-10-CM | POA: Diagnosis not present

## 2022-08-07 DIAGNOSIS — I4891 Unspecified atrial fibrillation: Secondary | ICD-10-CM | POA: Diagnosis not present

## 2022-08-08 ENCOUNTER — Ambulatory Visit (INDEPENDENT_AMBULATORY_CARE_PROVIDER_SITE_OTHER): Payer: Medicare Other

## 2022-08-08 DIAGNOSIS — I5022 Chronic systolic (congestive) heart failure: Secondary | ICD-10-CM | POA: Diagnosis not present

## 2022-08-08 DIAGNOSIS — I4891 Unspecified atrial fibrillation: Secondary | ICD-10-CM | POA: Diagnosis not present

## 2022-08-08 DIAGNOSIS — I472 Ventricular tachycardia, unspecified: Secondary | ICD-10-CM | POA: Diagnosis not present

## 2022-08-08 DIAGNOSIS — I255 Ischemic cardiomyopathy: Secondary | ICD-10-CM | POA: Diagnosis not present

## 2022-08-08 DIAGNOSIS — E039 Hypothyroidism, unspecified: Secondary | ICD-10-CM | POA: Diagnosis not present

## 2022-08-08 DIAGNOSIS — I2510 Atherosclerotic heart disease of native coronary artery without angina pectoris: Secondary | ICD-10-CM | POA: Diagnosis not present

## 2022-08-08 LAB — CUP PACEART REMOTE DEVICE CHECK
Battery Remaining Longevity: 1 mo
Battery Voltage: 2.75 V
Brady Statistic AP VP Percent: 0.68 %
Brady Statistic AP VS Percent: 74.01 %
Brady Statistic AS VP Percent: 0.39 %
Brady Statistic AS VS Percent: 24.91 %
Brady Statistic RA Percent Paced: 73.39 %
Brady Statistic RV Percent Paced: 1.03 %
Date Time Interrogation Session: 20230811043726
HighPow Impedance: 33 Ohm
HighPow Impedance: 55 Ohm
Implantable Lead Implant Date: 19990520
Implantable Lead Implant Date: 20011228
Implantable Lead Location: 753859
Implantable Lead Location: 753860
Implantable Lead Model: 6940
Implantable Lead Model: 6947
Implantable Pulse Generator Implant Date: 20140331
Lead Channel Impedance Value: 285 Ohm
Lead Channel Impedance Value: 380 Ohm
Lead Channel Impedance Value: 437 Ohm
Lead Channel Pacing Threshold Amplitude: 0.875 V
Lead Channel Pacing Threshold Amplitude: 1.25 V
Lead Channel Pacing Threshold Pulse Width: 0.4 ms
Lead Channel Pacing Threshold Pulse Width: 0.4 ms
Lead Channel Sensing Intrinsic Amplitude: 1 mV
Lead Channel Sensing Intrinsic Amplitude: 1 mV
Lead Channel Sensing Intrinsic Amplitude: 6.625 mV
Lead Channel Sensing Intrinsic Amplitude: 6.625 mV
Lead Channel Setting Pacing Amplitude: 2 V
Lead Channel Setting Pacing Amplitude: 2.75 V
Lead Channel Setting Pacing Pulse Width: 0.4 ms
Lead Channel Setting Sensing Sensitivity: 0.3 mV

## 2022-08-11 DIAGNOSIS — I2510 Atherosclerotic heart disease of native coronary artery without angina pectoris: Secondary | ICD-10-CM | POA: Diagnosis not present

## 2022-08-11 DIAGNOSIS — I5022 Chronic systolic (congestive) heart failure: Secondary | ICD-10-CM | POA: Diagnosis not present

## 2022-08-11 DIAGNOSIS — I255 Ischemic cardiomyopathy: Secondary | ICD-10-CM | POA: Diagnosis not present

## 2022-08-11 DIAGNOSIS — I472 Ventricular tachycardia, unspecified: Secondary | ICD-10-CM | POA: Diagnosis not present

## 2022-08-11 DIAGNOSIS — I4891 Unspecified atrial fibrillation: Secondary | ICD-10-CM | POA: Diagnosis not present

## 2022-08-11 DIAGNOSIS — E039 Hypothyroidism, unspecified: Secondary | ICD-10-CM | POA: Diagnosis not present

## 2022-08-13 DIAGNOSIS — I5022 Chronic systolic (congestive) heart failure: Secondary | ICD-10-CM | POA: Diagnosis not present

## 2022-08-13 DIAGNOSIS — I4891 Unspecified atrial fibrillation: Secondary | ICD-10-CM | POA: Diagnosis not present

## 2022-08-13 DIAGNOSIS — E039 Hypothyroidism, unspecified: Secondary | ICD-10-CM | POA: Diagnosis not present

## 2022-08-13 DIAGNOSIS — I472 Ventricular tachycardia, unspecified: Secondary | ICD-10-CM | POA: Diagnosis not present

## 2022-08-13 DIAGNOSIS — I2510 Atherosclerotic heart disease of native coronary artery without angina pectoris: Secondary | ICD-10-CM | POA: Diagnosis not present

## 2022-08-13 DIAGNOSIS — I255 Ischemic cardiomyopathy: Secondary | ICD-10-CM | POA: Diagnosis not present

## 2022-08-14 DIAGNOSIS — I472 Ventricular tachycardia, unspecified: Secondary | ICD-10-CM | POA: Diagnosis not present

## 2022-08-14 DIAGNOSIS — I5022 Chronic systolic (congestive) heart failure: Secondary | ICD-10-CM | POA: Diagnosis not present

## 2022-08-14 DIAGNOSIS — I255 Ischemic cardiomyopathy: Secondary | ICD-10-CM | POA: Diagnosis not present

## 2022-08-14 DIAGNOSIS — I4891 Unspecified atrial fibrillation: Secondary | ICD-10-CM | POA: Diagnosis not present

## 2022-08-14 DIAGNOSIS — E039 Hypothyroidism, unspecified: Secondary | ICD-10-CM | POA: Diagnosis not present

## 2022-08-14 DIAGNOSIS — I2510 Atherosclerotic heart disease of native coronary artery without angina pectoris: Secondary | ICD-10-CM | POA: Diagnosis not present

## 2022-08-15 DIAGNOSIS — I255 Ischemic cardiomyopathy: Secondary | ICD-10-CM | POA: Diagnosis not present

## 2022-08-15 DIAGNOSIS — I2510 Atherosclerotic heart disease of native coronary artery without angina pectoris: Secondary | ICD-10-CM | POA: Diagnosis not present

## 2022-08-15 DIAGNOSIS — I472 Ventricular tachycardia, unspecified: Secondary | ICD-10-CM | POA: Diagnosis not present

## 2022-08-15 DIAGNOSIS — E039 Hypothyroidism, unspecified: Secondary | ICD-10-CM | POA: Diagnosis not present

## 2022-08-15 DIAGNOSIS — I5022 Chronic systolic (congestive) heart failure: Secondary | ICD-10-CM | POA: Diagnosis not present

## 2022-08-15 DIAGNOSIS — I4891 Unspecified atrial fibrillation: Secondary | ICD-10-CM | POA: Diagnosis not present

## 2022-08-20 DIAGNOSIS — I2510 Atherosclerotic heart disease of native coronary artery without angina pectoris: Secondary | ICD-10-CM | POA: Diagnosis not present

## 2022-08-20 DIAGNOSIS — E039 Hypothyroidism, unspecified: Secondary | ICD-10-CM | POA: Diagnosis not present

## 2022-08-20 DIAGNOSIS — I5022 Chronic systolic (congestive) heart failure: Secondary | ICD-10-CM | POA: Diagnosis not present

## 2022-08-20 DIAGNOSIS — I255 Ischemic cardiomyopathy: Secondary | ICD-10-CM | POA: Diagnosis not present

## 2022-08-20 DIAGNOSIS — I472 Ventricular tachycardia, unspecified: Secondary | ICD-10-CM | POA: Diagnosis not present

## 2022-08-20 DIAGNOSIS — I4891 Unspecified atrial fibrillation: Secondary | ICD-10-CM | POA: Diagnosis not present

## 2022-08-22 DIAGNOSIS — E039 Hypothyroidism, unspecified: Secondary | ICD-10-CM | POA: Diagnosis not present

## 2022-08-22 DIAGNOSIS — I5022 Chronic systolic (congestive) heart failure: Secondary | ICD-10-CM | POA: Diagnosis not present

## 2022-08-22 DIAGNOSIS — I2510 Atherosclerotic heart disease of native coronary artery without angina pectoris: Secondary | ICD-10-CM | POA: Diagnosis not present

## 2022-08-22 DIAGNOSIS — I4891 Unspecified atrial fibrillation: Secondary | ICD-10-CM | POA: Diagnosis not present

## 2022-08-22 DIAGNOSIS — I472 Ventricular tachycardia, unspecified: Secondary | ICD-10-CM | POA: Diagnosis not present

## 2022-08-22 DIAGNOSIS — I255 Ischemic cardiomyopathy: Secondary | ICD-10-CM | POA: Diagnosis not present

## 2022-08-25 DIAGNOSIS — I255 Ischemic cardiomyopathy: Secondary | ICD-10-CM | POA: Diagnosis not present

## 2022-08-25 DIAGNOSIS — I2510 Atherosclerotic heart disease of native coronary artery without angina pectoris: Secondary | ICD-10-CM | POA: Diagnosis not present

## 2022-08-25 DIAGNOSIS — I4891 Unspecified atrial fibrillation: Secondary | ICD-10-CM | POA: Diagnosis not present

## 2022-08-25 DIAGNOSIS — I5022 Chronic systolic (congestive) heart failure: Secondary | ICD-10-CM | POA: Diagnosis not present

## 2022-08-25 DIAGNOSIS — I472 Ventricular tachycardia, unspecified: Secondary | ICD-10-CM | POA: Diagnosis not present

## 2022-08-25 DIAGNOSIS — E039 Hypothyroidism, unspecified: Secondary | ICD-10-CM | POA: Diagnosis not present

## 2022-08-27 ENCOUNTER — Other Ambulatory Visit: Payer: Self-pay | Admitting: Neurology

## 2022-08-27 DIAGNOSIS — I4891 Unspecified atrial fibrillation: Secondary | ICD-10-CM | POA: Diagnosis not present

## 2022-08-27 DIAGNOSIS — I255 Ischemic cardiomyopathy: Secondary | ICD-10-CM | POA: Diagnosis not present

## 2022-08-27 DIAGNOSIS — I5022 Chronic systolic (congestive) heart failure: Secondary | ICD-10-CM | POA: Diagnosis not present

## 2022-08-27 DIAGNOSIS — E039 Hypothyroidism, unspecified: Secondary | ICD-10-CM | POA: Diagnosis not present

## 2022-08-27 DIAGNOSIS — I472 Ventricular tachycardia, unspecified: Secondary | ICD-10-CM | POA: Diagnosis not present

## 2022-08-27 DIAGNOSIS — I2510 Atherosclerotic heart disease of native coronary artery without angina pectoris: Secondary | ICD-10-CM | POA: Diagnosis not present

## 2022-08-28 DIAGNOSIS — I472 Ventricular tachycardia, unspecified: Secondary | ICD-10-CM | POA: Diagnosis not present

## 2022-08-28 DIAGNOSIS — E039 Hypothyroidism, unspecified: Secondary | ICD-10-CM | POA: Diagnosis not present

## 2022-08-28 DIAGNOSIS — I4891 Unspecified atrial fibrillation: Secondary | ICD-10-CM | POA: Diagnosis not present

## 2022-08-28 DIAGNOSIS — I2510 Atherosclerotic heart disease of native coronary artery without angina pectoris: Secondary | ICD-10-CM | POA: Diagnosis not present

## 2022-08-28 DIAGNOSIS — I5022 Chronic systolic (congestive) heart failure: Secondary | ICD-10-CM | POA: Diagnosis not present

## 2022-08-28 DIAGNOSIS — I255 Ischemic cardiomyopathy: Secondary | ICD-10-CM | POA: Diagnosis not present

## 2022-08-29 ENCOUNTER — Other Ambulatory Visit: Payer: Self-pay | Admitting: Cardiovascular Disease

## 2022-08-29 DIAGNOSIS — F0390 Unspecified dementia without behavioral disturbance: Secondary | ICD-10-CM | POA: Diagnosis not present

## 2022-08-29 DIAGNOSIS — I255 Ischemic cardiomyopathy: Secondary | ICD-10-CM | POA: Diagnosis not present

## 2022-08-29 DIAGNOSIS — I5022 Chronic systolic (congestive) heart failure: Secondary | ICD-10-CM | POA: Diagnosis not present

## 2022-08-29 DIAGNOSIS — K4090 Unilateral inguinal hernia, without obstruction or gangrene, not specified as recurrent: Secondary | ICD-10-CM | POA: Diagnosis not present

## 2022-08-29 DIAGNOSIS — I2510 Atherosclerotic heart disease of native coronary artery without angina pectoris: Secondary | ICD-10-CM | POA: Diagnosis not present

## 2022-08-29 DIAGNOSIS — E039 Hypothyroidism, unspecified: Secondary | ICD-10-CM | POA: Diagnosis not present

## 2022-08-29 DIAGNOSIS — E785 Hyperlipidemia, unspecified: Secondary | ICD-10-CM | POA: Diagnosis not present

## 2022-08-29 DIAGNOSIS — I472 Ventricular tachycardia, unspecified: Secondary | ICD-10-CM | POA: Diagnosis not present

## 2022-08-29 DIAGNOSIS — I4891 Unspecified atrial fibrillation: Secondary | ICD-10-CM | POA: Diagnosis not present

## 2022-09-02 DIAGNOSIS — I4891 Unspecified atrial fibrillation: Secondary | ICD-10-CM | POA: Diagnosis not present

## 2022-09-02 DIAGNOSIS — E039 Hypothyroidism, unspecified: Secondary | ICD-10-CM | POA: Diagnosis not present

## 2022-09-02 DIAGNOSIS — I472 Ventricular tachycardia, unspecified: Secondary | ICD-10-CM | POA: Diagnosis not present

## 2022-09-02 DIAGNOSIS — I5022 Chronic systolic (congestive) heart failure: Secondary | ICD-10-CM | POA: Diagnosis not present

## 2022-09-02 DIAGNOSIS — I255 Ischemic cardiomyopathy: Secondary | ICD-10-CM | POA: Diagnosis not present

## 2022-09-02 DIAGNOSIS — I2510 Atherosclerotic heart disease of native coronary artery without angina pectoris: Secondary | ICD-10-CM | POA: Diagnosis not present

## 2022-09-02 NOTE — Progress Notes (Signed)
Remote ICD transmission.   

## 2022-09-04 DIAGNOSIS — I255 Ischemic cardiomyopathy: Secondary | ICD-10-CM | POA: Diagnosis not present

## 2022-09-04 DIAGNOSIS — E039 Hypothyroidism, unspecified: Secondary | ICD-10-CM | POA: Diagnosis not present

## 2022-09-04 DIAGNOSIS — I4891 Unspecified atrial fibrillation: Secondary | ICD-10-CM | POA: Diagnosis not present

## 2022-09-04 DIAGNOSIS — I2510 Atherosclerotic heart disease of native coronary artery without angina pectoris: Secondary | ICD-10-CM | POA: Diagnosis not present

## 2022-09-04 DIAGNOSIS — I5022 Chronic systolic (congestive) heart failure: Secondary | ICD-10-CM | POA: Diagnosis not present

## 2022-09-04 DIAGNOSIS — I472 Ventricular tachycardia, unspecified: Secondary | ICD-10-CM | POA: Diagnosis not present

## 2022-09-08 ENCOUNTER — Ambulatory Visit (INDEPENDENT_AMBULATORY_CARE_PROVIDER_SITE_OTHER): Payer: Medicare Other

## 2022-09-08 DIAGNOSIS — I5022 Chronic systolic (congestive) heart failure: Secondary | ICD-10-CM | POA: Diagnosis not present

## 2022-09-08 DIAGNOSIS — E039 Hypothyroidism, unspecified: Secondary | ICD-10-CM | POA: Diagnosis not present

## 2022-09-08 DIAGNOSIS — I4891 Unspecified atrial fibrillation: Secondary | ICD-10-CM | POA: Diagnosis not present

## 2022-09-08 DIAGNOSIS — I472 Ventricular tachycardia, unspecified: Secondary | ICD-10-CM | POA: Diagnosis not present

## 2022-09-08 DIAGNOSIS — I2510 Atherosclerotic heart disease of native coronary artery without angina pectoris: Secondary | ICD-10-CM | POA: Diagnosis not present

## 2022-09-08 DIAGNOSIS — I255 Ischemic cardiomyopathy: Secondary | ICD-10-CM | POA: Diagnosis not present

## 2022-09-10 DIAGNOSIS — I255 Ischemic cardiomyopathy: Secondary | ICD-10-CM | POA: Diagnosis not present

## 2022-09-10 DIAGNOSIS — E039 Hypothyroidism, unspecified: Secondary | ICD-10-CM | POA: Diagnosis not present

## 2022-09-10 DIAGNOSIS — I5022 Chronic systolic (congestive) heart failure: Secondary | ICD-10-CM | POA: Diagnosis not present

## 2022-09-10 DIAGNOSIS — I2510 Atherosclerotic heart disease of native coronary artery without angina pectoris: Secondary | ICD-10-CM | POA: Diagnosis not present

## 2022-09-10 DIAGNOSIS — I472 Ventricular tachycardia, unspecified: Secondary | ICD-10-CM | POA: Diagnosis not present

## 2022-09-10 DIAGNOSIS — I4891 Unspecified atrial fibrillation: Secondary | ICD-10-CM | POA: Diagnosis not present

## 2022-09-10 LAB — CUP PACEART REMOTE DEVICE CHECK
Battery Remaining Longevity: 1 mo
Battery Voltage: 2.74 V
Brady Statistic AP VP Percent: 0.8 %
Brady Statistic AP VS Percent: 64.31 %
Brady Statistic AS VP Percent: 0.56 %
Brady Statistic AS VS Percent: 34.32 %
Brady Statistic RA Percent Paced: 62.78 %
Brady Statistic RV Percent Paced: 1.3 %
Date Time Interrogation Session: 20230911033524
HighPow Impedance: 36 Ohm
HighPow Impedance: 57 Ohm
Implantable Lead Implant Date: 19990520
Implantable Lead Implant Date: 20011228
Implantable Lead Location: 753859
Implantable Lead Location: 753860
Implantable Lead Model: 6940
Implantable Lead Model: 6947
Implantable Pulse Generator Implant Date: 20140331
Lead Channel Impedance Value: 323 Ohm
Lead Channel Impedance Value: 380 Ohm
Lead Channel Impedance Value: 437 Ohm
Lead Channel Pacing Threshold Amplitude: 0.875 V
Lead Channel Pacing Threshold Amplitude: 1 V
Lead Channel Pacing Threshold Pulse Width: 0.4 ms
Lead Channel Pacing Threshold Pulse Width: 0.4 ms
Lead Channel Sensing Intrinsic Amplitude: 0.75 mV
Lead Channel Sensing Intrinsic Amplitude: 0.75 mV
Lead Channel Sensing Intrinsic Amplitude: 10.5 mV
Lead Channel Sensing Intrinsic Amplitude: 10.5 mV
Lead Channel Setting Pacing Amplitude: 2 V
Lead Channel Setting Pacing Amplitude: 2.5 V
Lead Channel Setting Pacing Pulse Width: 0.4 ms
Lead Channel Setting Sensing Sensitivity: 0.3 mV

## 2022-09-11 DIAGNOSIS — I4891 Unspecified atrial fibrillation: Secondary | ICD-10-CM | POA: Diagnosis not present

## 2022-09-11 DIAGNOSIS — I2510 Atherosclerotic heart disease of native coronary artery without angina pectoris: Secondary | ICD-10-CM | POA: Diagnosis not present

## 2022-09-11 DIAGNOSIS — I472 Ventricular tachycardia, unspecified: Secondary | ICD-10-CM | POA: Diagnosis not present

## 2022-09-11 DIAGNOSIS — I255 Ischemic cardiomyopathy: Secondary | ICD-10-CM | POA: Diagnosis not present

## 2022-09-11 DIAGNOSIS — E039 Hypothyroidism, unspecified: Secondary | ICD-10-CM | POA: Diagnosis not present

## 2022-09-11 DIAGNOSIS — I5022 Chronic systolic (congestive) heart failure: Secondary | ICD-10-CM | POA: Diagnosis not present

## 2022-09-15 DIAGNOSIS — I4891 Unspecified atrial fibrillation: Secondary | ICD-10-CM | POA: Diagnosis not present

## 2022-09-15 DIAGNOSIS — I472 Ventricular tachycardia, unspecified: Secondary | ICD-10-CM | POA: Diagnosis not present

## 2022-09-15 DIAGNOSIS — I2510 Atherosclerotic heart disease of native coronary artery without angina pectoris: Secondary | ICD-10-CM | POA: Diagnosis not present

## 2022-09-15 DIAGNOSIS — I255 Ischemic cardiomyopathy: Secondary | ICD-10-CM | POA: Diagnosis not present

## 2022-09-15 DIAGNOSIS — E039 Hypothyroidism, unspecified: Secondary | ICD-10-CM | POA: Diagnosis not present

## 2022-09-15 DIAGNOSIS — I5022 Chronic systolic (congestive) heart failure: Secondary | ICD-10-CM | POA: Diagnosis not present

## 2022-09-16 DIAGNOSIS — I2510 Atherosclerotic heart disease of native coronary artery without angina pectoris: Secondary | ICD-10-CM | POA: Diagnosis not present

## 2022-09-16 DIAGNOSIS — I5022 Chronic systolic (congestive) heart failure: Secondary | ICD-10-CM | POA: Diagnosis not present

## 2022-09-16 DIAGNOSIS — I4891 Unspecified atrial fibrillation: Secondary | ICD-10-CM | POA: Diagnosis not present

## 2022-09-16 DIAGNOSIS — I255 Ischemic cardiomyopathy: Secondary | ICD-10-CM | POA: Diagnosis not present

## 2022-09-16 DIAGNOSIS — E039 Hypothyroidism, unspecified: Secondary | ICD-10-CM | POA: Diagnosis not present

## 2022-09-16 DIAGNOSIS — I472 Ventricular tachycardia, unspecified: Secondary | ICD-10-CM | POA: Diagnosis not present

## 2022-09-18 DIAGNOSIS — I5022 Chronic systolic (congestive) heart failure: Secondary | ICD-10-CM | POA: Diagnosis not present

## 2022-09-18 DIAGNOSIS — I2510 Atherosclerotic heart disease of native coronary artery without angina pectoris: Secondary | ICD-10-CM | POA: Diagnosis not present

## 2022-09-18 DIAGNOSIS — I4891 Unspecified atrial fibrillation: Secondary | ICD-10-CM | POA: Diagnosis not present

## 2022-09-18 DIAGNOSIS — I472 Ventricular tachycardia, unspecified: Secondary | ICD-10-CM | POA: Diagnosis not present

## 2022-09-18 DIAGNOSIS — E039 Hypothyroidism, unspecified: Secondary | ICD-10-CM | POA: Diagnosis not present

## 2022-09-18 DIAGNOSIS — I255 Ischemic cardiomyopathy: Secondary | ICD-10-CM | POA: Diagnosis not present

## 2022-09-19 DIAGNOSIS — I472 Ventricular tachycardia, unspecified: Secondary | ICD-10-CM | POA: Diagnosis not present

## 2022-09-19 DIAGNOSIS — I2510 Atherosclerotic heart disease of native coronary artery without angina pectoris: Secondary | ICD-10-CM | POA: Diagnosis not present

## 2022-09-19 DIAGNOSIS — I5022 Chronic systolic (congestive) heart failure: Secondary | ICD-10-CM | POA: Diagnosis not present

## 2022-09-19 DIAGNOSIS — I255 Ischemic cardiomyopathy: Secondary | ICD-10-CM | POA: Diagnosis not present

## 2022-09-19 DIAGNOSIS — E039 Hypothyroidism, unspecified: Secondary | ICD-10-CM | POA: Diagnosis not present

## 2022-09-19 DIAGNOSIS — I4891 Unspecified atrial fibrillation: Secondary | ICD-10-CM | POA: Diagnosis not present

## 2022-09-20 ENCOUNTER — Other Ambulatory Visit: Payer: Self-pay | Admitting: Neurology

## 2022-09-20 ENCOUNTER — Other Ambulatory Visit: Payer: Self-pay | Admitting: Cardiovascular Disease

## 2022-09-24 DIAGNOSIS — I5022 Chronic systolic (congestive) heart failure: Secondary | ICD-10-CM | POA: Diagnosis not present

## 2022-09-24 DIAGNOSIS — I472 Ventricular tachycardia, unspecified: Secondary | ICD-10-CM | POA: Diagnosis not present

## 2022-09-24 DIAGNOSIS — I4891 Unspecified atrial fibrillation: Secondary | ICD-10-CM | POA: Diagnosis not present

## 2022-09-24 DIAGNOSIS — I2510 Atherosclerotic heart disease of native coronary artery without angina pectoris: Secondary | ICD-10-CM | POA: Diagnosis not present

## 2022-09-24 DIAGNOSIS — E039 Hypothyroidism, unspecified: Secondary | ICD-10-CM | POA: Diagnosis not present

## 2022-09-24 DIAGNOSIS — I255 Ischemic cardiomyopathy: Secondary | ICD-10-CM | POA: Diagnosis not present

## 2022-09-25 DIAGNOSIS — E039 Hypothyroidism, unspecified: Secondary | ICD-10-CM | POA: Diagnosis not present

## 2022-09-25 DIAGNOSIS — I4891 Unspecified atrial fibrillation: Secondary | ICD-10-CM | POA: Diagnosis not present

## 2022-09-25 DIAGNOSIS — I472 Ventricular tachycardia, unspecified: Secondary | ICD-10-CM | POA: Diagnosis not present

## 2022-09-25 DIAGNOSIS — I255 Ischemic cardiomyopathy: Secondary | ICD-10-CM | POA: Diagnosis not present

## 2022-09-25 DIAGNOSIS — I5022 Chronic systolic (congestive) heart failure: Secondary | ICD-10-CM | POA: Diagnosis not present

## 2022-09-25 DIAGNOSIS — I2510 Atherosclerotic heart disease of native coronary artery without angina pectoris: Secondary | ICD-10-CM | POA: Diagnosis not present

## 2022-09-25 NOTE — Progress Notes (Signed)
Remote ICD transmission.   

## 2022-09-28 DIAGNOSIS — E785 Hyperlipidemia, unspecified: Secondary | ICD-10-CM | POA: Diagnosis not present

## 2022-09-28 DIAGNOSIS — I2510 Atherosclerotic heart disease of native coronary artery without angina pectoris: Secondary | ICD-10-CM | POA: Diagnosis not present

## 2022-09-28 DIAGNOSIS — I5022 Chronic systolic (congestive) heart failure: Secondary | ICD-10-CM | POA: Diagnosis not present

## 2022-09-28 DIAGNOSIS — F0390 Unspecified dementia without behavioral disturbance: Secondary | ICD-10-CM | POA: Diagnosis not present

## 2022-09-28 DIAGNOSIS — I472 Ventricular tachycardia, unspecified: Secondary | ICD-10-CM | POA: Diagnosis not present

## 2022-09-28 DIAGNOSIS — I255 Ischemic cardiomyopathy: Secondary | ICD-10-CM | POA: Diagnosis not present

## 2022-09-28 DIAGNOSIS — K4090 Unilateral inguinal hernia, without obstruction or gangrene, not specified as recurrent: Secondary | ICD-10-CM | POA: Diagnosis not present

## 2022-09-28 DIAGNOSIS — E039 Hypothyroidism, unspecified: Secondary | ICD-10-CM | POA: Diagnosis not present

## 2022-09-28 DIAGNOSIS — I4891 Unspecified atrial fibrillation: Secondary | ICD-10-CM | POA: Diagnosis not present

## 2022-09-29 ENCOUNTER — Telehealth: Payer: Self-pay

## 2022-09-29 DIAGNOSIS — I5022 Chronic systolic (congestive) heart failure: Secondary | ICD-10-CM | POA: Diagnosis not present

## 2022-09-29 DIAGNOSIS — E039 Hypothyroidism, unspecified: Secondary | ICD-10-CM | POA: Diagnosis not present

## 2022-09-29 DIAGNOSIS — I2510 Atherosclerotic heart disease of native coronary artery without angina pectoris: Secondary | ICD-10-CM | POA: Diagnosis not present

## 2022-09-29 DIAGNOSIS — I472 Ventricular tachycardia, unspecified: Secondary | ICD-10-CM | POA: Diagnosis not present

## 2022-09-29 DIAGNOSIS — I255 Ischemic cardiomyopathy: Secondary | ICD-10-CM | POA: Diagnosis not present

## 2022-09-29 DIAGNOSIS — I4891 Unspecified atrial fibrillation: Secondary | ICD-10-CM | POA: Diagnosis not present

## 2022-09-29 NOTE — Telephone Encounter (Signed)
Device alert for RRT 10/1 17 AT/AF for known atrial lead noise Optivol crossed threshold 7/21 and is ongoing. Route to triage.  Spoke to patients wife about device reaching RRT 09/28/22 as well as Optivol. Wife reports patient is bedridden and not able to ambulate to get into office for apt. Denies any symptoms such as fluid retention that would correlate with elevated optivol.   Advised I will forward to Dr. Olin Pia nurse to see if televisit would be an option. Wife is agreeable to plan.

## 2022-09-30 DIAGNOSIS — I472 Ventricular tachycardia, unspecified: Secondary | ICD-10-CM | POA: Diagnosis not present

## 2022-09-30 DIAGNOSIS — I255 Ischemic cardiomyopathy: Secondary | ICD-10-CM | POA: Diagnosis not present

## 2022-09-30 DIAGNOSIS — I4891 Unspecified atrial fibrillation: Secondary | ICD-10-CM | POA: Diagnosis not present

## 2022-09-30 DIAGNOSIS — I2510 Atherosclerotic heart disease of native coronary artery without angina pectoris: Secondary | ICD-10-CM | POA: Diagnosis not present

## 2022-09-30 DIAGNOSIS — I5022 Chronic systolic (congestive) heart failure: Secondary | ICD-10-CM | POA: Diagnosis not present

## 2022-09-30 DIAGNOSIS — E039 Hypothyroidism, unspecified: Secondary | ICD-10-CM | POA: Diagnosis not present

## 2022-10-02 DIAGNOSIS — I5022 Chronic systolic (congestive) heart failure: Secondary | ICD-10-CM | POA: Diagnosis not present

## 2022-10-02 DIAGNOSIS — I472 Ventricular tachycardia, unspecified: Secondary | ICD-10-CM | POA: Diagnosis not present

## 2022-10-02 DIAGNOSIS — E039 Hypothyroidism, unspecified: Secondary | ICD-10-CM | POA: Diagnosis not present

## 2022-10-02 DIAGNOSIS — I255 Ischemic cardiomyopathy: Secondary | ICD-10-CM | POA: Diagnosis not present

## 2022-10-02 DIAGNOSIS — I2510 Atherosclerotic heart disease of native coronary artery without angina pectoris: Secondary | ICD-10-CM | POA: Diagnosis not present

## 2022-10-02 DIAGNOSIS — I4891 Unspecified atrial fibrillation: Secondary | ICD-10-CM | POA: Diagnosis not present

## 2022-10-03 NOTE — Telephone Encounter (Signed)
Spoke with pt's wife, DPR and advised due to pt being bed bound and under care of hospice telephone appointment has been scheduled to discuss generator change.  Appointment 10/12 at 920am.  Pt's wife verbalizes understanding and agrees with current plan.

## 2022-10-06 DIAGNOSIS — I472 Ventricular tachycardia, unspecified: Secondary | ICD-10-CM | POA: Diagnosis not present

## 2022-10-06 DIAGNOSIS — I2510 Atherosclerotic heart disease of native coronary artery without angina pectoris: Secondary | ICD-10-CM | POA: Diagnosis not present

## 2022-10-06 DIAGNOSIS — I5022 Chronic systolic (congestive) heart failure: Secondary | ICD-10-CM | POA: Diagnosis not present

## 2022-10-06 DIAGNOSIS — E039 Hypothyroidism, unspecified: Secondary | ICD-10-CM | POA: Diagnosis not present

## 2022-10-06 DIAGNOSIS — I255 Ischemic cardiomyopathy: Secondary | ICD-10-CM | POA: Diagnosis not present

## 2022-10-06 DIAGNOSIS — I4891 Unspecified atrial fibrillation: Secondary | ICD-10-CM | POA: Diagnosis not present

## 2022-10-07 ENCOUNTER — Encounter: Payer: Self-pay | Admitting: *Deleted

## 2022-10-07 ENCOUNTER — Other Ambulatory Visit: Payer: Self-pay | Admitting: Cardiovascular Disease

## 2022-10-07 DIAGNOSIS — I5022 Chronic systolic (congestive) heart failure: Secondary | ICD-10-CM | POA: Diagnosis not present

## 2022-10-07 DIAGNOSIS — E039 Hypothyroidism, unspecified: Secondary | ICD-10-CM | POA: Diagnosis not present

## 2022-10-07 DIAGNOSIS — I472 Ventricular tachycardia, unspecified: Secondary | ICD-10-CM | POA: Diagnosis not present

## 2022-10-07 DIAGNOSIS — I2510 Atherosclerotic heart disease of native coronary artery without angina pectoris: Secondary | ICD-10-CM | POA: Diagnosis not present

## 2022-10-07 DIAGNOSIS — I255 Ischemic cardiomyopathy: Secondary | ICD-10-CM | POA: Diagnosis not present

## 2022-10-07 DIAGNOSIS — I4891 Unspecified atrial fibrillation: Secondary | ICD-10-CM | POA: Diagnosis not present

## 2022-10-08 ENCOUNTER — Other Ambulatory Visit: Payer: Self-pay | Admitting: Cardiovascular Disease

## 2022-10-08 ENCOUNTER — Telehealth: Payer: Self-pay | Admitting: Internal Medicine

## 2022-10-08 NOTE — Telephone Encounter (Signed)
Called pt wife back and left message inform her that pt needed to make overdue appt with Dr. Acie Fredrickson before anymore refills and that a refill was sent on 09/22/22, asking pt to set up overdue appt with Dr. Acie Fredrickson, but pt has not done so, pt was informed of a visual appt as well. Ask wife to give our office a call back.

## 2022-10-08 NOTE — Telephone Encounter (Signed)
*  STAT* If patient is at the pharmacy, call can be transferred to refill team.   1. Which medications need to be refilled? (please list name of each medication and dose if known)   spironolactone (ALDACTONE) 25 MG tablet amiodarone (PACERONE) 100 MG tablet  2. Which pharmacy/location (including street and city if local pharmacy) is medication to be sent to?  [facility] - [place], [place] - [place] [address]  3. Do they need a 30 day or 90 day supply?  90 days  Wife stated patient only has enough medication until tomorrow.  Wife stated patient is bed-ridden in hospice care.

## 2022-10-09 ENCOUNTER — Ambulatory Visit: Payer: Medicare Other | Attending: Internal Medicine | Admitting: Internal Medicine

## 2022-10-09 ENCOUNTER — Telehealth: Payer: Self-pay

## 2022-10-09 ENCOUNTER — Encounter: Payer: Self-pay | Admitting: Internal Medicine

## 2022-10-09 ENCOUNTER — Telehealth: Payer: Self-pay | Admitting: Cardiovascular Disease

## 2022-10-09 VITALS — BP 102/54 | HR 60 | Ht 71.0 in

## 2022-10-09 DIAGNOSIS — I255 Ischemic cardiomyopathy: Secondary | ICD-10-CM | POA: Diagnosis not present

## 2022-10-09 DIAGNOSIS — I480 Paroxysmal atrial fibrillation: Secondary | ICD-10-CM | POA: Diagnosis not present

## 2022-10-09 DIAGNOSIS — Z95810 Presence of automatic (implantable) cardiac defibrillator: Secondary | ICD-10-CM | POA: Diagnosis not present

## 2022-10-09 DIAGNOSIS — I5022 Chronic systolic (congestive) heart failure: Secondary | ICD-10-CM | POA: Diagnosis not present

## 2022-10-09 DIAGNOSIS — E039 Hypothyroidism, unspecified: Secondary | ICD-10-CM | POA: Diagnosis not present

## 2022-10-09 DIAGNOSIS — I472 Ventricular tachycardia, unspecified: Secondary | ICD-10-CM

## 2022-10-09 DIAGNOSIS — I2510 Atherosclerotic heart disease of native coronary artery without angina pectoris: Secondary | ICD-10-CM | POA: Diagnosis not present

## 2022-10-09 DIAGNOSIS — I4891 Unspecified atrial fibrillation: Secondary | ICD-10-CM | POA: Diagnosis not present

## 2022-10-09 LAB — CUP PACEART REMOTE DEVICE CHECK
Battery Remaining Longevity: 1 mo
Battery Voltage: 2.68 V
Brady Statistic AP VP Percent: 1.94 %
Brady Statistic AP VS Percent: 79.01 %
Brady Statistic AS VP Percent: 1.3 %
Brady Statistic AS VS Percent: 17.75 %
Brady Statistic RA Percent Paced: 79.14 %
Brady Statistic RV Percent Paced: 3.18 %
Date Time Interrogation Session: 20231012043724
HighPow Impedance: 39 Ohm
HighPow Impedance: 60 Ohm
Implantable Lead Implant Date: 19990520
Implantable Lead Implant Date: 20011228
Implantable Lead Location: 753859
Implantable Lead Location: 753860
Implantable Lead Model: 6940
Implantable Lead Model: 6947
Implantable Pulse Generator Implant Date: 20140331
Lead Channel Impedance Value: 323 Ohm
Lead Channel Impedance Value: 380 Ohm
Lead Channel Impedance Value: 456 Ohm
Lead Channel Pacing Threshold Amplitude: 0.75 V
Lead Channel Pacing Threshold Amplitude: 1 V
Lead Channel Pacing Threshold Pulse Width: 0.4 ms
Lead Channel Pacing Threshold Pulse Width: 0.4 ms
Lead Channel Sensing Intrinsic Amplitude: 0.875 mV
Lead Channel Sensing Intrinsic Amplitude: 0.875 mV
Lead Channel Sensing Intrinsic Amplitude: 9.25 mV
Lead Channel Sensing Intrinsic Amplitude: 9.25 mV
Lead Channel Setting Pacing Amplitude: 2 V
Lead Channel Setting Pacing Amplitude: 2.5 V
Lead Channel Setting Pacing Pulse Width: 0.4 ms
Lead Channel Setting Sensing Sensitivity: 0.3 mV

## 2022-10-09 MED ORDER — SPIRONOLACTONE 25 MG PO TABS: 12.5000 mg | ORAL_TABLET | Freq: Every morning | ORAL | 1 refills | Status: AC

## 2022-10-09 MED ORDER — AMIODARONE HCL 100 MG PO TABS: 50.0000 mg | ORAL_TABLET | Freq: Every morning | ORAL | 1 refills | Status: AC

## 2022-10-09 NOTE — Progress Notes (Signed)
Electrophysiology TeleHealth Note      Date:  10/09/2022   ID:  [name], [name] December 05, 1935, MRN [id number]  Location: patient's home  Provider location: [address], [place]  Evaluation Performed: Follow-up visit  PCP:  Burnard Bunting, MD  Cardiologist:    Electrophysiologist:  SK   Chief Complaint:     History of Present Illness:    [name] is a 87 y.o. male who presents via audio/video conferencing for a telehealth visit today.  Since last being seen in our clinic for ventricular tachycardia in the setting of ischemic cardiomyopathy prior bypass surgery recurrent VT and VT storm with ongoing amiodarone therapy status post ICD implantation with multiple generator replacements most recently 2014 with ongoing left ventricular dysfunction and his device has now reached RRT.  He uses his atrial pacemaker about 80% of the time and this mostly at his lower rate limit.  The family reports that he has now been transferred to hospice care.  Is their desire and certainly in line with hospice care that his ICD be turned off.   Date Cr K TSH LFTs Cr LDL  3/19     2.265 15 1.24    8/22 1.18 4.1 4.22   1.18                        The patient denies symptoms of fevers, chills, cough, or new SOB worrisome for COVID 19.    Past Medical History:  Diagnosis Date   Atrial arrhythmia/a fibrillation    not on coumadin 2/2 subdural hematoma   Automatic implantable cardiac defibrillator in situ    medtronic   Automatic implantable cardioverter-defibrillator in situ    CHF (congestive heart failure) (HCC)    Chronic systolic congestive heart failure ([place])    Complication of anesthesia    per daughter "took a little bit coming out of it last time"   Coronary artery disease    Dementia ([place])    Depression    Dysrhythmia    Encounter for long-term (current) use of other medications    amiodarone   H/O hiatal hernia    Hyperlipidemia    Hypertension     Hypothyroidism    Ischemic cardiomyopathy    status post CABG with patent vein grafts and an atretic LIMA to his ramus, January 2012   Myocardial infarction [facility])    1995, non-STEMI January 2012   Pacemaker    Pneumonia    Subdural hematoma ([place])    on coumadin   Thyroid disease    hypothyroid   Ventricular fibrillation ([place])    Ventricular tachycardia ([place])     VT Storm-January 2012- responded to Amiodarone    Past Surgical History:  Procedure Laterality Date   bilateral cranitomies for sub hematomas     BYPASS GRAFT  12/29/1993   5   CHOLECYSTECTOMY N/A 12/26/2017   Procedure: LAPAROSCOPIC CHOLECYSTECTOMY;  Surgeon: Ileana Roup, MD;  Location: [facility];  Service: General;  Laterality: N/A;   CORONARY ARTERY BYPASS GRAFT  12/29/1993   5   CRANIOTOMY Left 08/23/2021   Procedure: Left cranial wound revision, removal of plate anbd screw;  Surgeon: Erline Levine, MD;  Location: [facility];  Service: Neurosurgery;  Laterality: Left;   IMPLANTABLE CARDIOVERTER DEFIBRILLATOR GENERATOR CHANGE N/A 03/28/2013   Procedure: IMPLANTABLE CARDIOVERTER DEFIBRILLATOR GENERATOR CHANGE;  Surgeon: Deboraha Sprang, MD;  Location: [facility] CATH LAB;  Service: Cardiovascular;  Laterality: N/A;   INGUINAL HERNIA REPAIR Left 05/15/2014   Procedure: HERNIA REPAIR INGUINAL ADULT;  Surgeon: Zenovia Jarred, MD;  Location: [facility];  Service: General;  Laterality: Left;   INSERT / REPLACE / REMOVE PACEMAKER  12/29/2012    generator repl    INSERTION OF MESH Left 05/15/2014   Procedure: INSERTION OF MESH;  Surgeon: Zenovia Jarred, MD;  Location: [facility];  Service: General;  Laterality: Left;   IR GUIDED DRAIN W CATHETER PLACEMENT  03/25/2018   IR RADIOLOGIST EVAL & MGMT  04/07/2018   MANDIBLE SURGERY     TONSILLECTOMY      Current Outpatient Medications  Medication Sig Dispense Refill   amiodarone (PACERONE) 100 MG tablet TAKE 1/2 TABLET BY MOUTH EVERY MORNING 7 tablet 0   aspirin EC 81 MG tablet Take 81 mg by  mouth daily.     carvedilol (COREG) 3.125 MG tablet Take 3.125 mg by mouth 2 (two) times daily.     ciprofloxacin (CIPRO) 500 MG tablet Take 500 mg by mouth 2 (two) times daily.     donepezil (ARICEPT) 10 MG tablet Take 10 mg by mouth daily.     escitalopram (LEXAPRO) 10 MG tablet Take 10 mg by mouth at bedtime.     fenofibrate 160 MG tablet TAKE ONE TABLET BY MOUTH EVERY MORNING 15 tablet 0   haloperidol (HALDOL) 1 MG tablet Take 1 mg by mouth as needed for anxiety.     levothyroxine (SYNTHROID, LEVOTHROID) 75 MCG tablet Take 1 tablet (75 mcg total) by mouth daily before breakfast.  3   memantine (NAMENDA) 10 MG tablet TAKE ONE TABLET BY MOUTH TWICE DAILY 60 tablet 0   nitroGLYCERIN (NITROSTAT) 0.4 MG SL tablet Place 0.4 mg under the tongue every 5 (five) minutes as needed for chest pain.     spironolactone (ALDACTONE) 25 MG tablet TAKE 1/2 TABLET BY MOUTH EVERY MORNING 7 tablet 0   azithromycin (ZITHROMAX) 250 MG tablet Take 1 tablet (250 mg total) by mouth daily. Take first 2 tablets together, then 1 every day until finished. (Patient not taking: Reported on 03/06/2022) 6 tablet 0   b complex vitamins tablet Take 1 tablet by mouth daily. (Patient not taking: Reported on 10/09/2022)     Coenzyme Q10 (COQ10 PO) Take 100 mg by mouth daily. (Patient not taking: Reported on 10/09/2022)     Cyanocobalamin (VITAMIN B-12 IJ) Inject as directed every 30 (thirty) days. (Patient not taking: Reported on 10/09/2022)     HYDROcodone-acetaminophen (NORCO/VICODIN) 5-325 MG tablet Take 1 tablet by mouth every 6 (six) hours as needed (pain). (Patient not taking: Reported on 03/06/2022) 30 tablet 0   lisinopril (ZESTRIL) 5 MG tablet Take 1 tablet (5 mg total) by mouth daily. (Patient not taking: Reported on 10/09/2022) 90 tablet 3   Multiple Vitamin (MULTIVITAMIN WITH MINERALS) TABS tablet Take 1 tablet by mouth daily. (Patient not taking: Reported on 10/09/2022)     traMADol (ULTRAM) 50 MG tablet Take 50 mg by mouth  every 6 (six) hours as needed. (Patient not taking: Reported on 10/09/2022)     No current facility-administered medications for this visit.    Allergies:   Prednisone   Social History:  The patient  reports that he has never smoked. He has quit using smokeless tobacco. He reports that he does not drink alcohol and does not use drugs.   Family History:  The patient's   family history includes Cerebral aneurysm in his father; Hypertension in  his father.   ROS:  Please see the history of present illness.   All other systems are personally reviewed and negative.    Exam:    Vital Signs:  BP (!) 102/54   Pulse 60   Ht '5\' 11"'$  (1.803 m)   SpO2 99%   BMI 22.18 kg/m       Labs/Other Tests and Data Reviewed:    Recent Labs: 03/06/2022: ALT 12; TSH 2.670   Wt Readings from Last 3 Encounters:  03/06/22 159 lb (72.1 kg)  02/21/22 170 lb (77.1 kg)  08/29/21 161 lb 3.2 oz (73.1 kg)         ASSESSMENT & PLAN:    Ischemic cardiomyopathy   Congestive heart failure-chronic-systolic   Ventricular tachycardia   Atrial lead failure   High risk medication surveillance-amiodarone   Hypothyroidism-treated   Defibrillator-Medtronic   Patient status is now DNR.  The plan will be to e inactivate the defibrillator and turn off the alarms.  I will reach out to [employer]  At this juncture 9 hours later, the ICD has been turned off     Follow-up: None  Current medicines are reviewed at length with the patient today.   The patient  concerns regarding his medicines.  The following changes were made today: Medications were refilled  Labs/ tests ordered today include:  No orders of the defined types were placed in this encounter.     Today, I have spent 11 minutes with the patient with telehealth technology discussing the above.  Signed, Virl Axe, MD  10/09/2022 9:52 AM     [address] [place] [address] [phone]  (office) [phone] (fax)

## 2022-10-09 NOTE — Telephone Encounter (Signed)
Son stated the patient is in hospice care and wanted to report the patient's medication got refilled so he is all set.

## 2022-10-09 NOTE — Telephone Encounter (Signed)
Pts daughter Malachy Mood me on Hospice... I will forward to Clarise Cruz to review Dr Elmarie Shiley schedule ... she is asking for refills on the Spironolactone and Amiodarone since someone has declined the refills until he is seen... pt had a virtual visit with Dr Caryl Comes this morning.   Refills sent to his Pharmacy.

## 2022-10-09 NOTE — Telephone Encounter (Signed)
  Patient Consent for Virtual Visit        SHAKIM Mora has provided verbal consent on 10/09/2022 for a virtual visit (video or telephone).   CONSENT FOR VIRTUAL VISIT FOR:  [name]  By participating in this virtual visit I agree to the following:  I hereby voluntarily request, consent and authorize [facility] and its employed or contracted physicians, physician assistants, nurse practitioners or other licensed health care professionals (the Practitioner), to provide me with telemedicine health care services (the "Services") as deemed necessary by the treating Practitioner. I acknowledge and consent to receive the Services by the Practitioner via telemedicine. I understand that the telemedicine visit will involve communicating with the Practitioner through live audiovisual communication technology and the disclosure of certain medical information by electronic transmission. I acknowledge that I have been given the opportunity to request an in-person assessment or other available alternative prior to the telemedicine visit and am voluntarily participating in the telemedicine visit.  I understand that I have the right to withhold or withdraw my consent to the use of telemedicine in the course of my care at any time, without affecting my right to future care or treatment, and that the Practitioner or I may terminate the telemedicine visit at any time. I understand that I have the right to inspect all information obtained and/or recorded in the course of the telemedicine visit and may receive copies of available information for a reasonable fee.  I understand that some of the potential risks of receiving the Services via telemedicine include:  Delay or interruption in medical evaluation due to technological equipment failure or disruption; Information transmitted may not be sufficient (e.g. poor resolution of images) to allow for appropriate medical decision making by the  Practitioner; and/or  In rare instances, security protocols could fail, causing a breach of personal health information.  Furthermore, I acknowledge that it is my responsibility to provide information about my medical history, conditions and care that is complete and accurate to the best of my ability. I acknowledge that Practitioner's advice, recommendations, and/or decision may be based on factors not within their control, such as incomplete or inaccurate data provided by me or distortions of diagnostic images or specimens that may result from electronic transmissions. I understand that the practice of medicine is not an exact science and that Practitioner makes no warranties or guarantees regarding treatment outcomes. I acknowledge that a copy of this consent can be made available to me via my patient portal ([facility]), or I can request a printed copy by calling the office of [facility].    I understand that my insurance will be billed for this visit.   I have read or had this consent read to me. I understand the contents of this consent, which adequately explains the benefits and risks of the Services being provided via telemedicine.  I have been provided ample opportunity to ask questions regarding this consent and the Services and have had my questions answered to my satisfaction. I give my informed consent for the services to be provided through the use of telemedicine in my medical care

## 2022-10-09 NOTE — Telephone Encounter (Signed)
Pt's daughter is requesting call back to see about getting a virtual appt set up for Dr. Acie Fredrickson for patient, since patient is unable to leave house.

## 2022-10-13 DIAGNOSIS — I255 Ischemic cardiomyopathy: Secondary | ICD-10-CM | POA: Diagnosis not present

## 2022-10-13 DIAGNOSIS — I472 Ventricular tachycardia, unspecified: Secondary | ICD-10-CM | POA: Diagnosis not present

## 2022-10-13 DIAGNOSIS — E039 Hypothyroidism, unspecified: Secondary | ICD-10-CM | POA: Diagnosis not present

## 2022-10-13 DIAGNOSIS — I2510 Atherosclerotic heart disease of native coronary artery without angina pectoris: Secondary | ICD-10-CM | POA: Diagnosis not present

## 2022-10-13 DIAGNOSIS — I5022 Chronic systolic (congestive) heart failure: Secondary | ICD-10-CM | POA: Diagnosis not present

## 2022-10-13 DIAGNOSIS — I4891 Unspecified atrial fibrillation: Secondary | ICD-10-CM | POA: Diagnosis not present

## 2022-10-15 NOTE — Patient Instructions (Signed)
Medication Instructions:  - Your physician recommends that you continue on your current medications as directed. Please refer to the Current Medication list given to you today.  *If you need a refill on your cardiac medications before your next appointment, please call your pharmacy*   Lab Work: - none ordered  If you have labs (blood work) drawn today and your tests are completely normal, you will receive your results only by: MyChart Message (if you have MyChart) OR A paper copy in the mail If you have any lab test that is abnormal or we need to change your treatment, we will call you to review the results.   Testing/Procedures: - none ordered   Follow-Up: At [facility], you and your health needs are our priority.  As part of our continuing mission to provide you with exceptional heart care, we have created designated Provider Care Teams.  These Care Teams include your primary Cardiologist (physician) and Advanced Practice Providers (APPs -  Physician Assistants and Nurse Practitioners) who all work together to provide you with the care you need, when you need it.  We recommend signing up for the patient portal called "MyChart".  Sign up information is provided on this After Visit Summary.  MyChart is used to connect with patients for Virtual Visits (Telemedicine).  Patients are able to view lab/test results, encounter notes, upcoming appointments, etc.  Non-urgent messages can be sent to your provider as well.   To learn more about what you can do with MyChart, go to [email or web address].    Your next appointment:   As needed   The format for your next appointment:   N/a  Provider:   Steven Klein, MD    Other Instructions N/a  Important Information About Sugar       

## 2022-10-16 DIAGNOSIS — I255 Ischemic cardiomyopathy: Secondary | ICD-10-CM | POA: Diagnosis not present

## 2022-10-16 DIAGNOSIS — I5022 Chronic systolic (congestive) heart failure: Secondary | ICD-10-CM | POA: Diagnosis not present

## 2022-10-16 DIAGNOSIS — I4891 Unspecified atrial fibrillation: Secondary | ICD-10-CM | POA: Diagnosis not present

## 2022-10-16 DIAGNOSIS — I472 Ventricular tachycardia, unspecified: Secondary | ICD-10-CM | POA: Diagnosis not present

## 2022-10-16 DIAGNOSIS — I2510 Atherosclerotic heart disease of native coronary artery without angina pectoris: Secondary | ICD-10-CM | POA: Diagnosis not present

## 2022-10-16 DIAGNOSIS — E039 Hypothyroidism, unspecified: Secondary | ICD-10-CM | POA: Diagnosis not present

## 2022-10-17 ENCOUNTER — Other Ambulatory Visit: Payer: Self-pay | Admitting: Cardiovascular Disease

## 2022-10-17 ENCOUNTER — Other Ambulatory Visit: Payer: Self-pay | Admitting: Neurology

## 2022-10-20 ENCOUNTER — Telehealth: Payer: Self-pay | Admitting: Anesthesiology

## 2022-10-20 DIAGNOSIS — I255 Ischemic cardiomyopathy: Secondary | ICD-10-CM | POA: Diagnosis not present

## 2022-10-20 DIAGNOSIS — I472 Ventricular tachycardia, unspecified: Secondary | ICD-10-CM | POA: Diagnosis not present

## 2022-10-20 DIAGNOSIS — I2510 Atherosclerotic heart disease of native coronary artery without angina pectoris: Secondary | ICD-10-CM | POA: Diagnosis not present

## 2022-10-20 DIAGNOSIS — I5022 Chronic systolic (congestive) heart failure: Secondary | ICD-10-CM | POA: Diagnosis not present

## 2022-10-20 DIAGNOSIS — E039 Hypothyroidism, unspecified: Secondary | ICD-10-CM | POA: Diagnosis not present

## 2022-10-20 DIAGNOSIS — I4891 Unspecified atrial fibrillation: Secondary | ICD-10-CM | POA: Diagnosis not present

## 2022-10-20 NOTE — Telephone Encounter (Signed)
Patient's daughter [name] called regarding patient's medication Memantine 10 mg he needs a refill. Patient's daughter requests call back would like to give some update regarding her fathers health.

## 2022-10-21 DIAGNOSIS — I5022 Chronic systolic (congestive) heart failure: Secondary | ICD-10-CM | POA: Diagnosis not present

## 2022-10-21 DIAGNOSIS — I472 Ventricular tachycardia, unspecified: Secondary | ICD-10-CM | POA: Diagnosis not present

## 2022-10-21 DIAGNOSIS — I255 Ischemic cardiomyopathy: Secondary | ICD-10-CM | POA: Diagnosis not present

## 2022-10-21 DIAGNOSIS — I4891 Unspecified atrial fibrillation: Secondary | ICD-10-CM | POA: Diagnosis not present

## 2022-10-21 DIAGNOSIS — E039 Hypothyroidism, unspecified: Secondary | ICD-10-CM | POA: Diagnosis not present

## 2022-10-21 DIAGNOSIS — R300 Dysuria: Secondary | ICD-10-CM | POA: Diagnosis not present

## 2022-10-21 DIAGNOSIS — I2510 Atherosclerotic heart disease of native coronary artery without angina pectoris: Secondary | ICD-10-CM | POA: Diagnosis not present

## 2022-10-23 DIAGNOSIS — I2510 Atherosclerotic heart disease of native coronary artery without angina pectoris: Secondary | ICD-10-CM | POA: Diagnosis not present

## 2022-10-23 DIAGNOSIS — I4891 Unspecified atrial fibrillation: Secondary | ICD-10-CM | POA: Diagnosis not present

## 2022-10-23 DIAGNOSIS — I472 Ventricular tachycardia, unspecified: Secondary | ICD-10-CM | POA: Diagnosis not present

## 2022-10-23 DIAGNOSIS — I5022 Chronic systolic (congestive) heart failure: Secondary | ICD-10-CM | POA: Diagnosis not present

## 2022-10-23 DIAGNOSIS — I255 Ischemic cardiomyopathy: Secondary | ICD-10-CM | POA: Diagnosis not present

## 2022-10-23 DIAGNOSIS — E039 Hypothyroidism, unspecified: Secondary | ICD-10-CM | POA: Diagnosis not present

## 2022-10-24 DIAGNOSIS — I4891 Unspecified atrial fibrillation: Secondary | ICD-10-CM | POA: Diagnosis not present

## 2022-10-24 DIAGNOSIS — I5022 Chronic systolic (congestive) heart failure: Secondary | ICD-10-CM | POA: Diagnosis not present

## 2022-10-24 DIAGNOSIS — I255 Ischemic cardiomyopathy: Secondary | ICD-10-CM | POA: Diagnosis not present

## 2022-10-24 DIAGNOSIS — I472 Ventricular tachycardia, unspecified: Secondary | ICD-10-CM | POA: Diagnosis not present

## 2022-10-24 DIAGNOSIS — I2510 Atherosclerotic heart disease of native coronary artery without angina pectoris: Secondary | ICD-10-CM | POA: Diagnosis not present

## 2022-10-24 DIAGNOSIS — E039 Hypothyroidism, unspecified: Secondary | ICD-10-CM | POA: Diagnosis not present

## 2022-10-27 DIAGNOSIS — I472 Ventricular tachycardia, unspecified: Secondary | ICD-10-CM | POA: Diagnosis not present

## 2022-10-27 DIAGNOSIS — I5022 Chronic systolic (congestive) heart failure: Secondary | ICD-10-CM | POA: Diagnosis not present

## 2022-10-27 DIAGNOSIS — I4891 Unspecified atrial fibrillation: Secondary | ICD-10-CM | POA: Diagnosis not present

## 2022-10-27 DIAGNOSIS — E039 Hypothyroidism, unspecified: Secondary | ICD-10-CM | POA: Diagnosis not present

## 2022-10-27 DIAGNOSIS — I2510 Atherosclerotic heart disease of native coronary artery without angina pectoris: Secondary | ICD-10-CM | POA: Diagnosis not present

## 2022-10-27 DIAGNOSIS — I255 Ischemic cardiomyopathy: Secondary | ICD-10-CM | POA: Diagnosis not present

## 2022-10-28 DIAGNOSIS — I2510 Atherosclerotic heart disease of native coronary artery without angina pectoris: Secondary | ICD-10-CM | POA: Diagnosis not present

## 2022-10-28 DIAGNOSIS — E039 Hypothyroidism, unspecified: Secondary | ICD-10-CM | POA: Diagnosis not present

## 2022-10-28 DIAGNOSIS — I472 Ventricular tachycardia, unspecified: Secondary | ICD-10-CM | POA: Diagnosis not present

## 2022-10-28 DIAGNOSIS — I255 Ischemic cardiomyopathy: Secondary | ICD-10-CM | POA: Diagnosis not present

## 2022-10-28 DIAGNOSIS — I5022 Chronic systolic (congestive) heart failure: Secondary | ICD-10-CM | POA: Diagnosis not present

## 2022-10-28 DIAGNOSIS — I4891 Unspecified atrial fibrillation: Secondary | ICD-10-CM | POA: Diagnosis not present

## 2022-10-29 DIAGNOSIS — F0390 Unspecified dementia without behavioral disturbance: Secondary | ICD-10-CM | POA: Diagnosis not present

## 2022-10-29 DIAGNOSIS — K4090 Unilateral inguinal hernia, without obstruction or gangrene, not specified as recurrent: Secondary | ICD-10-CM | POA: Diagnosis not present

## 2022-10-29 DIAGNOSIS — I2510 Atherosclerotic heart disease of native coronary artery without angina pectoris: Secondary | ICD-10-CM | POA: Diagnosis not present

## 2022-10-29 DIAGNOSIS — E785 Hyperlipidemia, unspecified: Secondary | ICD-10-CM | POA: Diagnosis not present

## 2022-10-29 DIAGNOSIS — I4891 Unspecified atrial fibrillation: Secondary | ICD-10-CM | POA: Diagnosis not present

## 2022-10-29 DIAGNOSIS — I5022 Chronic systolic (congestive) heart failure: Secondary | ICD-10-CM | POA: Diagnosis not present

## 2022-10-29 DIAGNOSIS — E039 Hypothyroidism, unspecified: Secondary | ICD-10-CM | POA: Diagnosis not present

## 2022-10-29 DIAGNOSIS — I472 Ventricular tachycardia, unspecified: Secondary | ICD-10-CM | POA: Diagnosis not present

## 2022-10-29 DIAGNOSIS — I255 Ischemic cardiomyopathy: Secondary | ICD-10-CM | POA: Diagnosis not present

## 2022-10-30 DIAGNOSIS — E039 Hypothyroidism, unspecified: Secondary | ICD-10-CM | POA: Diagnosis not present

## 2022-10-30 DIAGNOSIS — I472 Ventricular tachycardia, unspecified: Secondary | ICD-10-CM | POA: Diagnosis not present

## 2022-10-30 DIAGNOSIS — I2510 Atherosclerotic heart disease of native coronary artery without angina pectoris: Secondary | ICD-10-CM | POA: Diagnosis not present

## 2022-10-30 DIAGNOSIS — I4891 Unspecified atrial fibrillation: Secondary | ICD-10-CM | POA: Diagnosis not present

## 2022-10-30 DIAGNOSIS — I255 Ischemic cardiomyopathy: Secondary | ICD-10-CM | POA: Diagnosis not present

## 2022-10-30 DIAGNOSIS — I5022 Chronic systolic (congestive) heart failure: Secondary | ICD-10-CM | POA: Diagnosis not present

## 2022-11-03 DIAGNOSIS — I255 Ischemic cardiomyopathy: Secondary | ICD-10-CM | POA: Diagnosis not present

## 2022-11-03 DIAGNOSIS — I4891 Unspecified atrial fibrillation: Secondary | ICD-10-CM | POA: Diagnosis not present

## 2022-11-03 DIAGNOSIS — I472 Ventricular tachycardia, unspecified: Secondary | ICD-10-CM | POA: Diagnosis not present

## 2022-11-03 DIAGNOSIS — E039 Hypothyroidism, unspecified: Secondary | ICD-10-CM | POA: Diagnosis not present

## 2022-11-03 DIAGNOSIS — I5022 Chronic systolic (congestive) heart failure: Secondary | ICD-10-CM | POA: Diagnosis not present

## 2022-11-03 DIAGNOSIS — I2510 Atherosclerotic heart disease of native coronary artery without angina pectoris: Secondary | ICD-10-CM | POA: Diagnosis not present

## 2022-11-04 DIAGNOSIS — E039 Hypothyroidism, unspecified: Secondary | ICD-10-CM | POA: Diagnosis not present

## 2022-11-04 DIAGNOSIS — I255 Ischemic cardiomyopathy: Secondary | ICD-10-CM | POA: Diagnosis not present

## 2022-11-04 DIAGNOSIS — I5022 Chronic systolic (congestive) heart failure: Secondary | ICD-10-CM | POA: Diagnosis not present

## 2022-11-04 DIAGNOSIS — I472 Ventricular tachycardia, unspecified: Secondary | ICD-10-CM | POA: Diagnosis not present

## 2022-11-04 DIAGNOSIS — I2510 Atherosclerotic heart disease of native coronary artery without angina pectoris: Secondary | ICD-10-CM | POA: Diagnosis not present

## 2022-11-04 DIAGNOSIS — I4891 Unspecified atrial fibrillation: Secondary | ICD-10-CM | POA: Diagnosis not present

## 2022-11-06 DIAGNOSIS — E039 Hypothyroidism, unspecified: Secondary | ICD-10-CM | POA: Diagnosis not present

## 2022-11-06 DIAGNOSIS — I4891 Unspecified atrial fibrillation: Secondary | ICD-10-CM | POA: Diagnosis not present

## 2022-11-06 DIAGNOSIS — I255 Ischemic cardiomyopathy: Secondary | ICD-10-CM | POA: Diagnosis not present

## 2022-11-06 DIAGNOSIS — I5022 Chronic systolic (congestive) heart failure: Secondary | ICD-10-CM | POA: Diagnosis not present

## 2022-11-06 DIAGNOSIS — I472 Ventricular tachycardia, unspecified: Secondary | ICD-10-CM | POA: Diagnosis not present

## 2022-11-06 DIAGNOSIS — I2510 Atherosclerotic heart disease of native coronary artery without angina pectoris: Secondary | ICD-10-CM | POA: Diagnosis not present

## 2022-11-10 DIAGNOSIS — I5022 Chronic systolic (congestive) heart failure: Secondary | ICD-10-CM | POA: Diagnosis not present

## 2022-11-10 DIAGNOSIS — I472 Ventricular tachycardia, unspecified: Secondary | ICD-10-CM | POA: Diagnosis not present

## 2022-11-10 DIAGNOSIS — E039 Hypothyroidism, unspecified: Secondary | ICD-10-CM | POA: Diagnosis not present

## 2022-11-10 DIAGNOSIS — I4891 Unspecified atrial fibrillation: Secondary | ICD-10-CM | POA: Diagnosis not present

## 2022-11-10 DIAGNOSIS — I2510 Atherosclerotic heart disease of native coronary artery without angina pectoris: Secondary | ICD-10-CM | POA: Diagnosis not present

## 2022-11-10 DIAGNOSIS — I255 Ischemic cardiomyopathy: Secondary | ICD-10-CM | POA: Diagnosis not present

## 2022-11-11 DIAGNOSIS — I4891 Unspecified atrial fibrillation: Secondary | ICD-10-CM | POA: Diagnosis not present

## 2022-11-11 DIAGNOSIS — E039 Hypothyroidism, unspecified: Secondary | ICD-10-CM | POA: Diagnosis not present

## 2022-11-11 DIAGNOSIS — I255 Ischemic cardiomyopathy: Secondary | ICD-10-CM | POA: Diagnosis not present

## 2022-11-11 DIAGNOSIS — I5022 Chronic systolic (congestive) heart failure: Secondary | ICD-10-CM | POA: Diagnosis not present

## 2022-11-11 DIAGNOSIS — I2510 Atherosclerotic heart disease of native coronary artery without angina pectoris: Secondary | ICD-10-CM | POA: Diagnosis not present

## 2022-11-11 DIAGNOSIS — I472 Ventricular tachycardia, unspecified: Secondary | ICD-10-CM | POA: Diagnosis not present

## 2022-11-13 DIAGNOSIS — I472 Ventricular tachycardia, unspecified: Secondary | ICD-10-CM | POA: Diagnosis not present

## 2022-11-13 DIAGNOSIS — I5022 Chronic systolic (congestive) heart failure: Secondary | ICD-10-CM | POA: Diagnosis not present

## 2022-11-13 DIAGNOSIS — I255 Ischemic cardiomyopathy: Secondary | ICD-10-CM | POA: Diagnosis not present

## 2022-11-13 DIAGNOSIS — E039 Hypothyroidism, unspecified: Secondary | ICD-10-CM | POA: Diagnosis not present

## 2022-11-13 DIAGNOSIS — I4891 Unspecified atrial fibrillation: Secondary | ICD-10-CM | POA: Diagnosis not present

## 2022-11-13 DIAGNOSIS — I2510 Atherosclerotic heart disease of native coronary artery without angina pectoris: Secondary | ICD-10-CM | POA: Diagnosis not present

## 2022-11-17 DIAGNOSIS — I255 Ischemic cardiomyopathy: Secondary | ICD-10-CM | POA: Diagnosis not present

## 2022-11-17 DIAGNOSIS — I2510 Atherosclerotic heart disease of native coronary artery without angina pectoris: Secondary | ICD-10-CM | POA: Diagnosis not present

## 2022-11-17 DIAGNOSIS — I472 Ventricular tachycardia, unspecified: Secondary | ICD-10-CM | POA: Diagnosis not present

## 2022-11-17 DIAGNOSIS — E039 Hypothyroidism, unspecified: Secondary | ICD-10-CM | POA: Diagnosis not present

## 2022-11-17 DIAGNOSIS — I4891 Unspecified atrial fibrillation: Secondary | ICD-10-CM | POA: Diagnosis not present

## 2022-11-17 DIAGNOSIS — I5022 Chronic systolic (congestive) heart failure: Secondary | ICD-10-CM | POA: Diagnosis not present

## 2022-11-21 DIAGNOSIS — E039 Hypothyroidism, unspecified: Secondary | ICD-10-CM | POA: Diagnosis not present

## 2022-11-21 DIAGNOSIS — I4891 Unspecified atrial fibrillation: Secondary | ICD-10-CM | POA: Diagnosis not present

## 2022-11-21 DIAGNOSIS — I2510 Atherosclerotic heart disease of native coronary artery without angina pectoris: Secondary | ICD-10-CM | POA: Diagnosis not present

## 2022-11-21 DIAGNOSIS — I5022 Chronic systolic (congestive) heart failure: Secondary | ICD-10-CM | POA: Diagnosis not present

## 2022-11-21 DIAGNOSIS — I472 Ventricular tachycardia, unspecified: Secondary | ICD-10-CM | POA: Diagnosis not present

## 2022-11-21 DIAGNOSIS — I255 Ischemic cardiomyopathy: Secondary | ICD-10-CM | POA: Diagnosis not present

## 2022-11-24 DIAGNOSIS — I5022 Chronic systolic (congestive) heart failure: Secondary | ICD-10-CM | POA: Diagnosis not present

## 2022-11-24 DIAGNOSIS — E039 Hypothyroidism, unspecified: Secondary | ICD-10-CM | POA: Diagnosis not present

## 2022-11-24 DIAGNOSIS — I472 Ventricular tachycardia, unspecified: Secondary | ICD-10-CM | POA: Diagnosis not present

## 2022-11-24 DIAGNOSIS — I4891 Unspecified atrial fibrillation: Secondary | ICD-10-CM | POA: Diagnosis not present

## 2022-11-24 DIAGNOSIS — I2510 Atherosclerotic heart disease of native coronary artery without angina pectoris: Secondary | ICD-10-CM | POA: Diagnosis not present

## 2022-11-24 DIAGNOSIS — I255 Ischemic cardiomyopathy: Secondary | ICD-10-CM | POA: Diagnosis not present

## 2022-11-25 DIAGNOSIS — I5022 Chronic systolic (congestive) heart failure: Secondary | ICD-10-CM | POA: Diagnosis not present

## 2022-11-25 DIAGNOSIS — I2510 Atherosclerotic heart disease of native coronary artery without angina pectoris: Secondary | ICD-10-CM | POA: Diagnosis not present

## 2022-11-25 DIAGNOSIS — E039 Hypothyroidism, unspecified: Secondary | ICD-10-CM | POA: Diagnosis not present

## 2022-11-25 DIAGNOSIS — I4891 Unspecified atrial fibrillation: Secondary | ICD-10-CM | POA: Diagnosis not present

## 2022-11-25 DIAGNOSIS — I255 Ischemic cardiomyopathy: Secondary | ICD-10-CM | POA: Diagnosis not present

## 2022-11-25 DIAGNOSIS — I472 Ventricular tachycardia, unspecified: Secondary | ICD-10-CM | POA: Diagnosis not present

## 2022-11-27 DIAGNOSIS — I472 Ventricular tachycardia, unspecified: Secondary | ICD-10-CM | POA: Diagnosis not present

## 2022-11-27 DIAGNOSIS — I5022 Chronic systolic (congestive) heart failure: Secondary | ICD-10-CM | POA: Diagnosis not present

## 2022-11-27 DIAGNOSIS — I2510 Atherosclerotic heart disease of native coronary artery without angina pectoris: Secondary | ICD-10-CM | POA: Diagnosis not present

## 2022-11-27 DIAGNOSIS — I4891 Unspecified atrial fibrillation: Secondary | ICD-10-CM | POA: Diagnosis not present

## 2022-11-27 DIAGNOSIS — I255 Ischemic cardiomyopathy: Secondary | ICD-10-CM | POA: Diagnosis not present

## 2022-11-27 DIAGNOSIS — E039 Hypothyroidism, unspecified: Secondary | ICD-10-CM | POA: Diagnosis not present

## 2022-11-28 DIAGNOSIS — I255 Ischemic cardiomyopathy: Secondary | ICD-10-CM | POA: Diagnosis not present

## 2022-11-28 DIAGNOSIS — I5022 Chronic systolic (congestive) heart failure: Secondary | ICD-10-CM | POA: Diagnosis not present

## 2022-11-28 DIAGNOSIS — E039 Hypothyroidism, unspecified: Secondary | ICD-10-CM | POA: Diagnosis not present

## 2022-11-28 DIAGNOSIS — I2510 Atherosclerotic heart disease of native coronary artery without angina pectoris: Secondary | ICD-10-CM | POA: Diagnosis not present

## 2022-11-28 DIAGNOSIS — K4090 Unilateral inguinal hernia, without obstruction or gangrene, not specified as recurrent: Secondary | ICD-10-CM | POA: Diagnosis not present

## 2022-11-28 DIAGNOSIS — F0390 Unspecified dementia without behavioral disturbance: Secondary | ICD-10-CM | POA: Diagnosis not present

## 2022-11-28 DIAGNOSIS — I4891 Unspecified atrial fibrillation: Secondary | ICD-10-CM | POA: Diagnosis not present

## 2022-11-28 DIAGNOSIS — I472 Ventricular tachycardia, unspecified: Secondary | ICD-10-CM | POA: Diagnosis not present

## 2022-11-28 DIAGNOSIS — E785 Hyperlipidemia, unspecified: Secondary | ICD-10-CM | POA: Diagnosis not present

## 2022-12-01 DIAGNOSIS — E039 Hypothyroidism, unspecified: Secondary | ICD-10-CM | POA: Diagnosis not present

## 2022-12-01 DIAGNOSIS — I5022 Chronic systolic (congestive) heart failure: Secondary | ICD-10-CM | POA: Diagnosis not present

## 2022-12-01 DIAGNOSIS — I255 Ischemic cardiomyopathy: Secondary | ICD-10-CM | POA: Diagnosis not present

## 2022-12-01 DIAGNOSIS — I472 Ventricular tachycardia, unspecified: Secondary | ICD-10-CM | POA: Diagnosis not present

## 2022-12-01 DIAGNOSIS — I2510 Atherosclerotic heart disease of native coronary artery without angina pectoris: Secondary | ICD-10-CM | POA: Diagnosis not present

## 2022-12-01 DIAGNOSIS — I4891 Unspecified atrial fibrillation: Secondary | ICD-10-CM | POA: Diagnosis not present

## 2022-12-02 DIAGNOSIS — Z23 Encounter for immunization: Secondary | ICD-10-CM | POA: Diagnosis not present

## 2022-12-03 DIAGNOSIS — I472 Ventricular tachycardia, unspecified: Secondary | ICD-10-CM | POA: Diagnosis not present

## 2022-12-03 DIAGNOSIS — I2510 Atherosclerotic heart disease of native coronary artery without angina pectoris: Secondary | ICD-10-CM | POA: Diagnosis not present

## 2022-12-03 DIAGNOSIS — E039 Hypothyroidism, unspecified: Secondary | ICD-10-CM | POA: Diagnosis not present

## 2022-12-03 DIAGNOSIS — I255 Ischemic cardiomyopathy: Secondary | ICD-10-CM | POA: Diagnosis not present

## 2022-12-03 DIAGNOSIS — I4891 Unspecified atrial fibrillation: Secondary | ICD-10-CM | POA: Diagnosis not present

## 2022-12-03 DIAGNOSIS — I5022 Chronic systolic (congestive) heart failure: Secondary | ICD-10-CM | POA: Diagnosis not present

## 2022-12-04 DIAGNOSIS — I4891 Unspecified atrial fibrillation: Secondary | ICD-10-CM | POA: Diagnosis not present

## 2022-12-04 DIAGNOSIS — E039 Hypothyroidism, unspecified: Secondary | ICD-10-CM | POA: Diagnosis not present

## 2022-12-04 DIAGNOSIS — I2510 Atherosclerotic heart disease of native coronary artery without angina pectoris: Secondary | ICD-10-CM | POA: Diagnosis not present

## 2022-12-04 DIAGNOSIS — I255 Ischemic cardiomyopathy: Secondary | ICD-10-CM | POA: Diagnosis not present

## 2022-12-04 DIAGNOSIS — I472 Ventricular tachycardia, unspecified: Secondary | ICD-10-CM | POA: Diagnosis not present

## 2022-12-04 DIAGNOSIS — I5022 Chronic systolic (congestive) heart failure: Secondary | ICD-10-CM | POA: Diagnosis not present

## 2022-12-08 DIAGNOSIS — E039 Hypothyroidism, unspecified: Secondary | ICD-10-CM | POA: Diagnosis not present

## 2022-12-08 DIAGNOSIS — I4891 Unspecified atrial fibrillation: Secondary | ICD-10-CM | POA: Diagnosis not present

## 2022-12-08 DIAGNOSIS — I472 Ventricular tachycardia, unspecified: Secondary | ICD-10-CM | POA: Diagnosis not present

## 2022-12-08 DIAGNOSIS — I5022 Chronic systolic (congestive) heart failure: Secondary | ICD-10-CM | POA: Diagnosis not present

## 2022-12-08 DIAGNOSIS — I2510 Atherosclerotic heart disease of native coronary artery without angina pectoris: Secondary | ICD-10-CM | POA: Diagnosis not present

## 2022-12-08 DIAGNOSIS — I255 Ischemic cardiomyopathy: Secondary | ICD-10-CM | POA: Diagnosis not present

## 2022-12-10 DIAGNOSIS — I472 Ventricular tachycardia, unspecified: Secondary | ICD-10-CM | POA: Diagnosis not present

## 2022-12-10 DIAGNOSIS — I5022 Chronic systolic (congestive) heart failure: Secondary | ICD-10-CM | POA: Diagnosis not present

## 2022-12-10 DIAGNOSIS — I2510 Atherosclerotic heart disease of native coronary artery without angina pectoris: Secondary | ICD-10-CM | POA: Diagnosis not present

## 2022-12-10 DIAGNOSIS — I255 Ischemic cardiomyopathy: Secondary | ICD-10-CM | POA: Diagnosis not present

## 2022-12-10 DIAGNOSIS — E039 Hypothyroidism, unspecified: Secondary | ICD-10-CM | POA: Diagnosis not present

## 2022-12-10 DIAGNOSIS — I4891 Unspecified atrial fibrillation: Secondary | ICD-10-CM | POA: Diagnosis not present

## 2022-12-11 DIAGNOSIS — I472 Ventricular tachycardia, unspecified: Secondary | ICD-10-CM | POA: Diagnosis not present

## 2022-12-11 DIAGNOSIS — I5022 Chronic systolic (congestive) heart failure: Secondary | ICD-10-CM | POA: Diagnosis not present

## 2022-12-11 DIAGNOSIS — E039 Hypothyroidism, unspecified: Secondary | ICD-10-CM | POA: Diagnosis not present

## 2022-12-11 DIAGNOSIS — I255 Ischemic cardiomyopathy: Secondary | ICD-10-CM | POA: Diagnosis not present

## 2022-12-11 DIAGNOSIS — I2510 Atherosclerotic heart disease of native coronary artery without angina pectoris: Secondary | ICD-10-CM | POA: Diagnosis not present

## 2022-12-11 DIAGNOSIS — I4891 Unspecified atrial fibrillation: Secondary | ICD-10-CM | POA: Diagnosis not present

## 2022-12-15 DIAGNOSIS — I5022 Chronic systolic (congestive) heart failure: Secondary | ICD-10-CM | POA: Diagnosis not present

## 2022-12-15 DIAGNOSIS — I2510 Atherosclerotic heart disease of native coronary artery without angina pectoris: Secondary | ICD-10-CM | POA: Diagnosis not present

## 2022-12-15 DIAGNOSIS — E039 Hypothyroidism, unspecified: Secondary | ICD-10-CM | POA: Diagnosis not present

## 2022-12-15 DIAGNOSIS — I255 Ischemic cardiomyopathy: Secondary | ICD-10-CM | POA: Diagnosis not present

## 2022-12-15 DIAGNOSIS — I4891 Unspecified atrial fibrillation: Secondary | ICD-10-CM | POA: Diagnosis not present

## 2022-12-15 DIAGNOSIS — I472 Ventricular tachycardia, unspecified: Secondary | ICD-10-CM | POA: Diagnosis not present

## 2022-12-18 DIAGNOSIS — I5022 Chronic systolic (congestive) heart failure: Secondary | ICD-10-CM | POA: Diagnosis not present

## 2022-12-18 DIAGNOSIS — I4891 Unspecified atrial fibrillation: Secondary | ICD-10-CM | POA: Diagnosis not present

## 2022-12-18 DIAGNOSIS — I255 Ischemic cardiomyopathy: Secondary | ICD-10-CM | POA: Diagnosis not present

## 2022-12-18 DIAGNOSIS — I2510 Atherosclerotic heart disease of native coronary artery without angina pectoris: Secondary | ICD-10-CM | POA: Diagnosis not present

## 2022-12-18 DIAGNOSIS — E039 Hypothyroidism, unspecified: Secondary | ICD-10-CM | POA: Diagnosis not present

## 2022-12-18 DIAGNOSIS — I472 Ventricular tachycardia, unspecified: Secondary | ICD-10-CM | POA: Diagnosis not present

## 2022-12-19 DIAGNOSIS — I5022 Chronic systolic (congestive) heart failure: Secondary | ICD-10-CM | POA: Diagnosis not present

## 2022-12-19 DIAGNOSIS — E039 Hypothyroidism, unspecified: Secondary | ICD-10-CM | POA: Diagnosis not present

## 2022-12-19 DIAGNOSIS — I255 Ischemic cardiomyopathy: Secondary | ICD-10-CM | POA: Diagnosis not present

## 2022-12-19 DIAGNOSIS — I2510 Atherosclerotic heart disease of native coronary artery without angina pectoris: Secondary | ICD-10-CM | POA: Diagnosis not present

## 2022-12-19 DIAGNOSIS — I4891 Unspecified atrial fibrillation: Secondary | ICD-10-CM | POA: Diagnosis not present

## 2022-12-19 DIAGNOSIS — I472 Ventricular tachycardia, unspecified: Secondary | ICD-10-CM | POA: Diagnosis not present

## 2022-12-26 DIAGNOSIS — I2510 Atherosclerotic heart disease of native coronary artery without angina pectoris: Secondary | ICD-10-CM | POA: Diagnosis not present

## 2022-12-26 DIAGNOSIS — E039 Hypothyroidism, unspecified: Secondary | ICD-10-CM | POA: Diagnosis not present

## 2022-12-26 DIAGNOSIS — I255 Ischemic cardiomyopathy: Secondary | ICD-10-CM | POA: Diagnosis not present

## 2022-12-26 DIAGNOSIS — I4891 Unspecified atrial fibrillation: Secondary | ICD-10-CM | POA: Diagnosis not present

## 2022-12-26 DIAGNOSIS — I472 Ventricular tachycardia, unspecified: Secondary | ICD-10-CM | POA: Diagnosis not present

## 2022-12-26 DIAGNOSIS — I5022 Chronic systolic (congestive) heart failure: Secondary | ICD-10-CM | POA: Diagnosis not present

## 2022-12-29 DIAGNOSIS — F0390 Unspecified dementia without behavioral disturbance: Secondary | ICD-10-CM | POA: Diagnosis not present

## 2022-12-29 DIAGNOSIS — E039 Hypothyroidism, unspecified: Secondary | ICD-10-CM | POA: Diagnosis not present

## 2022-12-29 DIAGNOSIS — E785 Hyperlipidemia, unspecified: Secondary | ICD-10-CM | POA: Diagnosis not present

## 2022-12-29 DIAGNOSIS — I4891 Unspecified atrial fibrillation: Secondary | ICD-10-CM | POA: Diagnosis not present

## 2022-12-29 DIAGNOSIS — I5022 Chronic systolic (congestive) heart failure: Secondary | ICD-10-CM | POA: Diagnosis not present

## 2022-12-29 DIAGNOSIS — I255 Ischemic cardiomyopathy: Secondary | ICD-10-CM | POA: Diagnosis not present

## 2022-12-29 DIAGNOSIS — K4090 Unilateral inguinal hernia, without obstruction or gangrene, not specified as recurrent: Secondary | ICD-10-CM | POA: Diagnosis not present

## 2022-12-29 DIAGNOSIS — I472 Ventricular tachycardia, unspecified: Secondary | ICD-10-CM | POA: Diagnosis not present

## 2022-12-29 DIAGNOSIS — I2510 Atherosclerotic heart disease of native coronary artery without angina pectoris: Secondary | ICD-10-CM | POA: Diagnosis not present

## 2023-01-01 DIAGNOSIS — I472 Ventricular tachycardia, unspecified: Secondary | ICD-10-CM | POA: Diagnosis not present

## 2023-01-01 DIAGNOSIS — I2510 Atherosclerotic heart disease of native coronary artery without angina pectoris: Secondary | ICD-10-CM | POA: Diagnosis not present

## 2023-01-01 DIAGNOSIS — I5022 Chronic systolic (congestive) heart failure: Secondary | ICD-10-CM | POA: Diagnosis not present

## 2023-01-01 DIAGNOSIS — I4891 Unspecified atrial fibrillation: Secondary | ICD-10-CM | POA: Diagnosis not present

## 2023-01-01 DIAGNOSIS — E039 Hypothyroidism, unspecified: Secondary | ICD-10-CM | POA: Diagnosis not present

## 2023-01-01 DIAGNOSIS — I255 Ischemic cardiomyopathy: Secondary | ICD-10-CM | POA: Diagnosis not present

## 2023-01-02 DIAGNOSIS — I5022 Chronic systolic (congestive) heart failure: Secondary | ICD-10-CM | POA: Diagnosis not present

## 2023-01-02 DIAGNOSIS — E039 Hypothyroidism, unspecified: Secondary | ICD-10-CM | POA: Diagnosis not present

## 2023-01-02 DIAGNOSIS — I255 Ischemic cardiomyopathy: Secondary | ICD-10-CM | POA: Diagnosis not present

## 2023-01-02 DIAGNOSIS — I4891 Unspecified atrial fibrillation: Secondary | ICD-10-CM | POA: Diagnosis not present

## 2023-01-02 DIAGNOSIS — I472 Ventricular tachycardia, unspecified: Secondary | ICD-10-CM | POA: Diagnosis not present

## 2023-01-02 DIAGNOSIS — I2510 Atherosclerotic heart disease of native coronary artery without angina pectoris: Secondary | ICD-10-CM | POA: Diagnosis not present

## 2023-01-05 DIAGNOSIS — I5022 Chronic systolic (congestive) heart failure: Secondary | ICD-10-CM | POA: Diagnosis not present

## 2023-01-05 DIAGNOSIS — E039 Hypothyroidism, unspecified: Secondary | ICD-10-CM | POA: Diagnosis not present

## 2023-01-05 DIAGNOSIS — I2510 Atherosclerotic heart disease of native coronary artery without angina pectoris: Secondary | ICD-10-CM | POA: Diagnosis not present

## 2023-01-05 DIAGNOSIS — I472 Ventricular tachycardia, unspecified: Secondary | ICD-10-CM | POA: Diagnosis not present

## 2023-01-05 DIAGNOSIS — I255 Ischemic cardiomyopathy: Secondary | ICD-10-CM | POA: Diagnosis not present

## 2023-01-05 DIAGNOSIS — I4891 Unspecified atrial fibrillation: Secondary | ICD-10-CM | POA: Diagnosis not present

## 2023-01-07 DIAGNOSIS — I5022 Chronic systolic (congestive) heart failure: Secondary | ICD-10-CM | POA: Diagnosis not present

## 2023-01-07 DIAGNOSIS — E039 Hypothyroidism, unspecified: Secondary | ICD-10-CM | POA: Diagnosis not present

## 2023-01-07 DIAGNOSIS — I2510 Atherosclerotic heart disease of native coronary artery without angina pectoris: Secondary | ICD-10-CM | POA: Diagnosis not present

## 2023-01-07 DIAGNOSIS — I472 Ventricular tachycardia, unspecified: Secondary | ICD-10-CM | POA: Diagnosis not present

## 2023-01-07 DIAGNOSIS — I4891 Unspecified atrial fibrillation: Secondary | ICD-10-CM | POA: Diagnosis not present

## 2023-01-07 DIAGNOSIS — I255 Ischemic cardiomyopathy: Secondary | ICD-10-CM | POA: Diagnosis not present

## 2023-01-08 DIAGNOSIS — E039 Hypothyroidism, unspecified: Secondary | ICD-10-CM | POA: Diagnosis not present

## 2023-01-08 DIAGNOSIS — I4891 Unspecified atrial fibrillation: Secondary | ICD-10-CM | POA: Diagnosis not present

## 2023-01-08 DIAGNOSIS — I5022 Chronic systolic (congestive) heart failure: Secondary | ICD-10-CM | POA: Diagnosis not present

## 2023-01-08 DIAGNOSIS — I2510 Atherosclerotic heart disease of native coronary artery without angina pectoris: Secondary | ICD-10-CM | POA: Diagnosis not present

## 2023-01-08 DIAGNOSIS — I472 Ventricular tachycardia, unspecified: Secondary | ICD-10-CM | POA: Diagnosis not present

## 2023-01-08 DIAGNOSIS — I255 Ischemic cardiomyopathy: Secondary | ICD-10-CM | POA: Diagnosis not present

## 2023-01-12 DIAGNOSIS — I472 Ventricular tachycardia, unspecified: Secondary | ICD-10-CM | POA: Diagnosis not present

## 2023-01-12 DIAGNOSIS — I4891 Unspecified atrial fibrillation: Secondary | ICD-10-CM | POA: Diagnosis not present

## 2023-01-12 DIAGNOSIS — I5022 Chronic systolic (congestive) heart failure: Secondary | ICD-10-CM | POA: Diagnosis not present

## 2023-01-12 DIAGNOSIS — I255 Ischemic cardiomyopathy: Secondary | ICD-10-CM | POA: Diagnosis not present

## 2023-01-12 DIAGNOSIS — I2510 Atherosclerotic heart disease of native coronary artery without angina pectoris: Secondary | ICD-10-CM | POA: Diagnosis not present

## 2023-01-12 DIAGNOSIS — E039 Hypothyroidism, unspecified: Secondary | ICD-10-CM | POA: Diagnosis not present

## 2023-01-13 DIAGNOSIS — I5022 Chronic systolic (congestive) heart failure: Secondary | ICD-10-CM | POA: Diagnosis not present

## 2023-01-13 DIAGNOSIS — I4891 Unspecified atrial fibrillation: Secondary | ICD-10-CM | POA: Diagnosis not present

## 2023-01-13 DIAGNOSIS — I2510 Atherosclerotic heart disease of native coronary artery without angina pectoris: Secondary | ICD-10-CM | POA: Diagnosis not present

## 2023-01-13 DIAGNOSIS — I255 Ischemic cardiomyopathy: Secondary | ICD-10-CM | POA: Diagnosis not present

## 2023-01-13 DIAGNOSIS — I472 Ventricular tachycardia, unspecified: Secondary | ICD-10-CM | POA: Diagnosis not present

## 2023-01-13 DIAGNOSIS — E039 Hypothyroidism, unspecified: Secondary | ICD-10-CM | POA: Diagnosis not present

## 2023-01-15 DIAGNOSIS — I255 Ischemic cardiomyopathy: Secondary | ICD-10-CM | POA: Diagnosis not present

## 2023-01-15 DIAGNOSIS — I5022 Chronic systolic (congestive) heart failure: Secondary | ICD-10-CM | POA: Diagnosis not present

## 2023-01-15 DIAGNOSIS — E039 Hypothyroidism, unspecified: Secondary | ICD-10-CM | POA: Diagnosis not present

## 2023-01-15 DIAGNOSIS — I2510 Atherosclerotic heart disease of native coronary artery without angina pectoris: Secondary | ICD-10-CM | POA: Diagnosis not present

## 2023-01-15 DIAGNOSIS — I4891 Unspecified atrial fibrillation: Secondary | ICD-10-CM | POA: Diagnosis not present

## 2023-01-15 DIAGNOSIS — I472 Ventricular tachycardia, unspecified: Secondary | ICD-10-CM | POA: Diagnosis not present

## 2023-01-19 DIAGNOSIS — E039 Hypothyroidism, unspecified: Secondary | ICD-10-CM | POA: Diagnosis not present

## 2023-01-19 DIAGNOSIS — I2510 Atherosclerotic heart disease of native coronary artery without angina pectoris: Secondary | ICD-10-CM | POA: Diagnosis not present

## 2023-01-19 DIAGNOSIS — I472 Ventricular tachycardia, unspecified: Secondary | ICD-10-CM | POA: Diagnosis not present

## 2023-01-19 DIAGNOSIS — I4891 Unspecified atrial fibrillation: Secondary | ICD-10-CM | POA: Diagnosis not present

## 2023-01-19 DIAGNOSIS — I255 Ischemic cardiomyopathy: Secondary | ICD-10-CM | POA: Diagnosis not present

## 2023-01-19 DIAGNOSIS — I5022 Chronic systolic (congestive) heart failure: Secondary | ICD-10-CM | POA: Diagnosis not present

## 2023-01-21 DIAGNOSIS — I5022 Chronic systolic (congestive) heart failure: Secondary | ICD-10-CM | POA: Diagnosis not present

## 2023-01-21 DIAGNOSIS — I2510 Atherosclerotic heart disease of native coronary artery without angina pectoris: Secondary | ICD-10-CM | POA: Diagnosis not present

## 2023-01-21 DIAGNOSIS — I4891 Unspecified atrial fibrillation: Secondary | ICD-10-CM | POA: Diagnosis not present

## 2023-01-21 DIAGNOSIS — I472 Ventricular tachycardia, unspecified: Secondary | ICD-10-CM | POA: Diagnosis not present

## 2023-01-21 DIAGNOSIS — I255 Ischemic cardiomyopathy: Secondary | ICD-10-CM | POA: Diagnosis not present

## 2023-01-21 DIAGNOSIS — E039 Hypothyroidism, unspecified: Secondary | ICD-10-CM | POA: Diagnosis not present

## 2023-01-22 DIAGNOSIS — E039 Hypothyroidism, unspecified: Secondary | ICD-10-CM | POA: Diagnosis not present

## 2023-01-22 DIAGNOSIS — I4891 Unspecified atrial fibrillation: Secondary | ICD-10-CM | POA: Diagnosis not present

## 2023-01-22 DIAGNOSIS — I5022 Chronic systolic (congestive) heart failure: Secondary | ICD-10-CM | POA: Diagnosis not present

## 2023-01-22 DIAGNOSIS — I255 Ischemic cardiomyopathy: Secondary | ICD-10-CM | POA: Diagnosis not present

## 2023-01-22 DIAGNOSIS — I472 Ventricular tachycardia, unspecified: Secondary | ICD-10-CM | POA: Diagnosis not present

## 2023-01-22 DIAGNOSIS — I2510 Atherosclerotic heart disease of native coronary artery without angina pectoris: Secondary | ICD-10-CM | POA: Diagnosis not present

## 2023-01-26 DIAGNOSIS — I4891 Unspecified atrial fibrillation: Secondary | ICD-10-CM | POA: Diagnosis not present

## 2023-01-26 DIAGNOSIS — E039 Hypothyroidism, unspecified: Secondary | ICD-10-CM | POA: Diagnosis not present

## 2023-01-26 DIAGNOSIS — I5022 Chronic systolic (congestive) heart failure: Secondary | ICD-10-CM | POA: Diagnosis not present

## 2023-01-26 DIAGNOSIS — I2510 Atherosclerotic heart disease of native coronary artery without angina pectoris: Secondary | ICD-10-CM | POA: Diagnosis not present

## 2023-01-26 DIAGNOSIS — I472 Ventricular tachycardia, unspecified: Secondary | ICD-10-CM | POA: Diagnosis not present

## 2023-01-26 DIAGNOSIS — I255 Ischemic cardiomyopathy: Secondary | ICD-10-CM | POA: Diagnosis not present

## 2023-01-27 DIAGNOSIS — I4891 Unspecified atrial fibrillation: Secondary | ICD-10-CM | POA: Diagnosis not present

## 2023-01-27 DIAGNOSIS — E039 Hypothyroidism, unspecified: Secondary | ICD-10-CM | POA: Diagnosis not present

## 2023-01-27 DIAGNOSIS — I255 Ischemic cardiomyopathy: Secondary | ICD-10-CM | POA: Diagnosis not present

## 2023-01-27 DIAGNOSIS — I5022 Chronic systolic (congestive) heart failure: Secondary | ICD-10-CM | POA: Diagnosis not present

## 2023-01-27 DIAGNOSIS — I472 Ventricular tachycardia, unspecified: Secondary | ICD-10-CM | POA: Diagnosis not present

## 2023-01-27 DIAGNOSIS — I2510 Atherosclerotic heart disease of native coronary artery without angina pectoris: Secondary | ICD-10-CM | POA: Diagnosis not present

## 2023-01-29 DIAGNOSIS — I2510 Atherosclerotic heart disease of native coronary artery without angina pectoris: Secondary | ICD-10-CM | POA: Diagnosis not present

## 2023-01-29 DIAGNOSIS — E039 Hypothyroidism, unspecified: Secondary | ICD-10-CM | POA: Diagnosis not present

## 2023-01-29 DIAGNOSIS — E785 Hyperlipidemia, unspecified: Secondary | ICD-10-CM | POA: Diagnosis not present

## 2023-01-29 DIAGNOSIS — F0390 Unspecified dementia without behavioral disturbance: Secondary | ICD-10-CM | POA: Diagnosis not present

## 2023-01-29 DIAGNOSIS — I5022 Chronic systolic (congestive) heart failure: Secondary | ICD-10-CM | POA: Diagnosis not present

## 2023-01-29 DIAGNOSIS — I472 Ventricular tachycardia, unspecified: Secondary | ICD-10-CM | POA: Diagnosis not present

## 2023-01-29 DIAGNOSIS — I4891 Unspecified atrial fibrillation: Secondary | ICD-10-CM | POA: Diagnosis not present

## 2023-01-29 DIAGNOSIS — I255 Ischemic cardiomyopathy: Secondary | ICD-10-CM | POA: Diagnosis not present

## 2023-01-29 DIAGNOSIS — K4090 Unilateral inguinal hernia, without obstruction or gangrene, not specified as recurrent: Secondary | ICD-10-CM | POA: Diagnosis not present

## 2023-02-01 DIAGNOSIS — E039 Hypothyroidism, unspecified: Secondary | ICD-10-CM | POA: Diagnosis not present

## 2023-02-01 DIAGNOSIS — I472 Ventricular tachycardia, unspecified: Secondary | ICD-10-CM | POA: Diagnosis not present

## 2023-02-01 DIAGNOSIS — I5022 Chronic systolic (congestive) heart failure: Secondary | ICD-10-CM | POA: Diagnosis not present

## 2023-02-01 DIAGNOSIS — I255 Ischemic cardiomyopathy: Secondary | ICD-10-CM | POA: Diagnosis not present

## 2023-02-01 DIAGNOSIS — I4891 Unspecified atrial fibrillation: Secondary | ICD-10-CM | POA: Diagnosis not present

## 2023-02-01 DIAGNOSIS — I2510 Atherosclerotic heart disease of native coronary artery without angina pectoris: Secondary | ICD-10-CM | POA: Diagnosis not present

## 2023-02-02 DIAGNOSIS — I472 Ventricular tachycardia, unspecified: Secondary | ICD-10-CM | POA: Diagnosis not present

## 2023-02-02 DIAGNOSIS — E039 Hypothyroidism, unspecified: Secondary | ICD-10-CM | POA: Diagnosis not present

## 2023-02-02 DIAGNOSIS — I255 Ischemic cardiomyopathy: Secondary | ICD-10-CM | POA: Diagnosis not present

## 2023-02-02 DIAGNOSIS — I4891 Unspecified atrial fibrillation: Secondary | ICD-10-CM | POA: Diagnosis not present

## 2023-02-02 DIAGNOSIS — I5022 Chronic systolic (congestive) heart failure: Secondary | ICD-10-CM | POA: Diagnosis not present

## 2023-02-02 DIAGNOSIS — I2510 Atherosclerotic heart disease of native coronary artery without angina pectoris: Secondary | ICD-10-CM | POA: Diagnosis not present

## 2023-02-04 DIAGNOSIS — I472 Ventricular tachycardia, unspecified: Secondary | ICD-10-CM | POA: Diagnosis not present

## 2023-02-04 DIAGNOSIS — I5022 Chronic systolic (congestive) heart failure: Secondary | ICD-10-CM | POA: Diagnosis not present

## 2023-02-04 DIAGNOSIS — I4891 Unspecified atrial fibrillation: Secondary | ICD-10-CM | POA: Diagnosis not present

## 2023-02-04 DIAGNOSIS — E039 Hypothyroidism, unspecified: Secondary | ICD-10-CM | POA: Diagnosis not present

## 2023-02-04 DIAGNOSIS — I2510 Atherosclerotic heart disease of native coronary artery without angina pectoris: Secondary | ICD-10-CM | POA: Diagnosis not present

## 2023-02-04 DIAGNOSIS — I255 Ischemic cardiomyopathy: Secondary | ICD-10-CM | POA: Diagnosis not present

## 2023-02-05 DIAGNOSIS — I255 Ischemic cardiomyopathy: Secondary | ICD-10-CM | POA: Diagnosis not present

## 2023-02-05 DIAGNOSIS — I5022 Chronic systolic (congestive) heart failure: Secondary | ICD-10-CM | POA: Diagnosis not present

## 2023-02-05 DIAGNOSIS — I2510 Atherosclerotic heart disease of native coronary artery without angina pectoris: Secondary | ICD-10-CM | POA: Diagnosis not present

## 2023-02-05 DIAGNOSIS — I4891 Unspecified atrial fibrillation: Secondary | ICD-10-CM | POA: Diagnosis not present

## 2023-02-05 DIAGNOSIS — E039 Hypothyroidism, unspecified: Secondary | ICD-10-CM | POA: Diagnosis not present

## 2023-02-05 DIAGNOSIS — I472 Ventricular tachycardia, unspecified: Secondary | ICD-10-CM | POA: Diagnosis not present

## 2023-02-09 DIAGNOSIS — I255 Ischemic cardiomyopathy: Secondary | ICD-10-CM | POA: Diagnosis not present

## 2023-02-09 DIAGNOSIS — I472 Ventricular tachycardia, unspecified: Secondary | ICD-10-CM | POA: Diagnosis not present

## 2023-02-09 DIAGNOSIS — E039 Hypothyroidism, unspecified: Secondary | ICD-10-CM | POA: Diagnosis not present

## 2023-02-09 DIAGNOSIS — I4891 Unspecified atrial fibrillation: Secondary | ICD-10-CM | POA: Diagnosis not present

## 2023-02-09 DIAGNOSIS — I2510 Atherosclerotic heart disease of native coronary artery without angina pectoris: Secondary | ICD-10-CM | POA: Diagnosis not present

## 2023-02-09 DIAGNOSIS — I5022 Chronic systolic (congestive) heart failure: Secondary | ICD-10-CM | POA: Diagnosis not present

## 2023-02-10 DIAGNOSIS — I4891 Unspecified atrial fibrillation: Secondary | ICD-10-CM | POA: Diagnosis not present

## 2023-02-10 DIAGNOSIS — I255 Ischemic cardiomyopathy: Secondary | ICD-10-CM | POA: Diagnosis not present

## 2023-02-10 DIAGNOSIS — I472 Ventricular tachycardia, unspecified: Secondary | ICD-10-CM | POA: Diagnosis not present

## 2023-02-10 DIAGNOSIS — E039 Hypothyroidism, unspecified: Secondary | ICD-10-CM | POA: Diagnosis not present

## 2023-02-10 DIAGNOSIS — I5022 Chronic systolic (congestive) heart failure: Secondary | ICD-10-CM | POA: Diagnosis not present

## 2023-02-10 DIAGNOSIS — I2510 Atherosclerotic heart disease of native coronary artery without angina pectoris: Secondary | ICD-10-CM | POA: Diagnosis not present

## 2023-02-12 DIAGNOSIS — I255 Ischemic cardiomyopathy: Secondary | ICD-10-CM | POA: Diagnosis not present

## 2023-02-12 DIAGNOSIS — E039 Hypothyroidism, unspecified: Secondary | ICD-10-CM | POA: Diagnosis not present

## 2023-02-12 DIAGNOSIS — I472 Ventricular tachycardia, unspecified: Secondary | ICD-10-CM | POA: Diagnosis not present

## 2023-02-12 DIAGNOSIS — I2510 Atherosclerotic heart disease of native coronary artery without angina pectoris: Secondary | ICD-10-CM | POA: Diagnosis not present

## 2023-02-12 DIAGNOSIS — I5022 Chronic systolic (congestive) heart failure: Secondary | ICD-10-CM | POA: Diagnosis not present

## 2023-02-12 DIAGNOSIS — I4891 Unspecified atrial fibrillation: Secondary | ICD-10-CM | POA: Diagnosis not present

## 2023-02-16 DIAGNOSIS — I255 Ischemic cardiomyopathy: Secondary | ICD-10-CM | POA: Diagnosis not present

## 2023-02-16 DIAGNOSIS — I4891 Unspecified atrial fibrillation: Secondary | ICD-10-CM | POA: Diagnosis not present

## 2023-02-16 DIAGNOSIS — I2510 Atherosclerotic heart disease of native coronary artery without angina pectoris: Secondary | ICD-10-CM | POA: Diagnosis not present

## 2023-02-16 DIAGNOSIS — I5022 Chronic systolic (congestive) heart failure: Secondary | ICD-10-CM | POA: Diagnosis not present

## 2023-02-16 DIAGNOSIS — I472 Ventricular tachycardia, unspecified: Secondary | ICD-10-CM | POA: Diagnosis not present

## 2023-02-16 DIAGNOSIS — E039 Hypothyroidism, unspecified: Secondary | ICD-10-CM | POA: Diagnosis not present

## 2023-02-18 DIAGNOSIS — I4891 Unspecified atrial fibrillation: Secondary | ICD-10-CM | POA: Diagnosis not present

## 2023-02-18 DIAGNOSIS — I255 Ischemic cardiomyopathy: Secondary | ICD-10-CM | POA: Diagnosis not present

## 2023-02-18 DIAGNOSIS — I472 Ventricular tachycardia, unspecified: Secondary | ICD-10-CM | POA: Diagnosis not present

## 2023-02-18 DIAGNOSIS — E039 Hypothyroidism, unspecified: Secondary | ICD-10-CM | POA: Diagnosis not present

## 2023-02-18 DIAGNOSIS — I5022 Chronic systolic (congestive) heart failure: Secondary | ICD-10-CM | POA: Diagnosis not present

## 2023-02-18 DIAGNOSIS — I2510 Atherosclerotic heart disease of native coronary artery without angina pectoris: Secondary | ICD-10-CM | POA: Diagnosis not present

## 2023-02-19 DIAGNOSIS — I4891 Unspecified atrial fibrillation: Secondary | ICD-10-CM | POA: Diagnosis not present

## 2023-02-19 DIAGNOSIS — I255 Ischemic cardiomyopathy: Secondary | ICD-10-CM | POA: Diagnosis not present

## 2023-02-19 DIAGNOSIS — I2510 Atherosclerotic heart disease of native coronary artery without angina pectoris: Secondary | ICD-10-CM | POA: Diagnosis not present

## 2023-02-19 DIAGNOSIS — E039 Hypothyroidism, unspecified: Secondary | ICD-10-CM | POA: Diagnosis not present

## 2023-02-19 DIAGNOSIS — I5022 Chronic systolic (congestive) heart failure: Secondary | ICD-10-CM | POA: Diagnosis not present

## 2023-02-19 DIAGNOSIS — I472 Ventricular tachycardia, unspecified: Secondary | ICD-10-CM | POA: Diagnosis not present

## 2023-02-23 DIAGNOSIS — I255 Ischemic cardiomyopathy: Secondary | ICD-10-CM | POA: Diagnosis not present

## 2023-02-23 DIAGNOSIS — I4891 Unspecified atrial fibrillation: Secondary | ICD-10-CM | POA: Diagnosis not present

## 2023-02-23 DIAGNOSIS — I5022 Chronic systolic (congestive) heart failure: Secondary | ICD-10-CM | POA: Diagnosis not present

## 2023-02-23 DIAGNOSIS — E039 Hypothyroidism, unspecified: Secondary | ICD-10-CM | POA: Diagnosis not present

## 2023-02-23 DIAGNOSIS — I2510 Atherosclerotic heart disease of native coronary artery without angina pectoris: Secondary | ICD-10-CM | POA: Diagnosis not present

## 2023-02-23 DIAGNOSIS — I472 Ventricular tachycardia, unspecified: Secondary | ICD-10-CM | POA: Diagnosis not present

## 2023-02-26 DIAGNOSIS — I4891 Unspecified atrial fibrillation: Secondary | ICD-10-CM | POA: Diagnosis not present

## 2023-02-26 DIAGNOSIS — I5022 Chronic systolic (congestive) heart failure: Secondary | ICD-10-CM | POA: Diagnosis not present

## 2023-02-26 DIAGNOSIS — E039 Hypothyroidism, unspecified: Secondary | ICD-10-CM | POA: Diagnosis not present

## 2023-02-26 DIAGNOSIS — I2510 Atherosclerotic heart disease of native coronary artery without angina pectoris: Secondary | ICD-10-CM | POA: Diagnosis not present

## 2023-02-26 DIAGNOSIS — I255 Ischemic cardiomyopathy: Secondary | ICD-10-CM | POA: Diagnosis not present

## 2023-02-26 DIAGNOSIS — I472 Ventricular tachycardia, unspecified: Secondary | ICD-10-CM | POA: Diagnosis not present

## 2023-02-27 DIAGNOSIS — I2510 Atherosclerotic heart disease of native coronary artery without angina pectoris: Secondary | ICD-10-CM | POA: Diagnosis not present

## 2023-02-27 DIAGNOSIS — F0390 Unspecified dementia without behavioral disturbance: Secondary | ICD-10-CM | POA: Diagnosis not present

## 2023-02-27 DIAGNOSIS — I255 Ischemic cardiomyopathy: Secondary | ICD-10-CM | POA: Diagnosis not present

## 2023-02-27 DIAGNOSIS — E039 Hypothyroidism, unspecified: Secondary | ICD-10-CM | POA: Diagnosis not present

## 2023-02-27 DIAGNOSIS — I4891 Unspecified atrial fibrillation: Secondary | ICD-10-CM | POA: Diagnosis not present

## 2023-02-27 DIAGNOSIS — E785 Hyperlipidemia, unspecified: Secondary | ICD-10-CM | POA: Diagnosis not present

## 2023-02-27 DIAGNOSIS — I472 Ventricular tachycardia, unspecified: Secondary | ICD-10-CM | POA: Diagnosis not present

## 2023-02-27 DIAGNOSIS — K4090 Unilateral inguinal hernia, without obstruction or gangrene, not specified as recurrent: Secondary | ICD-10-CM | POA: Diagnosis not present

## 2023-02-27 DIAGNOSIS — I5022 Chronic systolic (congestive) heart failure: Secondary | ICD-10-CM | POA: Diagnosis not present

## 2023-03-02 DIAGNOSIS — I2510 Atherosclerotic heart disease of native coronary artery without angina pectoris: Secondary | ICD-10-CM | POA: Diagnosis not present

## 2023-03-02 DIAGNOSIS — I472 Ventricular tachycardia, unspecified: Secondary | ICD-10-CM | POA: Diagnosis not present

## 2023-03-02 DIAGNOSIS — I5022 Chronic systolic (congestive) heart failure: Secondary | ICD-10-CM | POA: Diagnosis not present

## 2023-03-02 DIAGNOSIS — E039 Hypothyroidism, unspecified: Secondary | ICD-10-CM | POA: Diagnosis not present

## 2023-03-02 DIAGNOSIS — I4891 Unspecified atrial fibrillation: Secondary | ICD-10-CM | POA: Diagnosis not present

## 2023-03-02 DIAGNOSIS — I255 Ischemic cardiomyopathy: Secondary | ICD-10-CM | POA: Diagnosis not present

## 2023-03-03 DIAGNOSIS — I255 Ischemic cardiomyopathy: Secondary | ICD-10-CM | POA: Diagnosis not present

## 2023-03-03 DIAGNOSIS — I4891 Unspecified atrial fibrillation: Secondary | ICD-10-CM | POA: Diagnosis not present

## 2023-03-03 DIAGNOSIS — E039 Hypothyroidism, unspecified: Secondary | ICD-10-CM | POA: Diagnosis not present

## 2023-03-03 DIAGNOSIS — I5022 Chronic systolic (congestive) heart failure: Secondary | ICD-10-CM | POA: Diagnosis not present

## 2023-03-03 DIAGNOSIS — I2510 Atherosclerotic heart disease of native coronary artery without angina pectoris: Secondary | ICD-10-CM | POA: Diagnosis not present

## 2023-03-03 DIAGNOSIS — I472 Ventricular tachycardia, unspecified: Secondary | ICD-10-CM | POA: Diagnosis not present

## 2023-03-05 DIAGNOSIS — I255 Ischemic cardiomyopathy: Secondary | ICD-10-CM | POA: Diagnosis not present

## 2023-03-05 DIAGNOSIS — E039 Hypothyroidism, unspecified: Secondary | ICD-10-CM | POA: Diagnosis not present

## 2023-03-05 DIAGNOSIS — I2510 Atherosclerotic heart disease of native coronary artery without angina pectoris: Secondary | ICD-10-CM | POA: Diagnosis not present

## 2023-03-05 DIAGNOSIS — I4891 Unspecified atrial fibrillation: Secondary | ICD-10-CM | POA: Diagnosis not present

## 2023-03-05 DIAGNOSIS — I5022 Chronic systolic (congestive) heart failure: Secondary | ICD-10-CM | POA: Diagnosis not present

## 2023-03-05 DIAGNOSIS — I472 Ventricular tachycardia, unspecified: Secondary | ICD-10-CM | POA: Diagnosis not present

## 2023-03-09 DIAGNOSIS — I2510 Atherosclerotic heart disease of native coronary artery without angina pectoris: Secondary | ICD-10-CM | POA: Diagnosis not present

## 2023-03-09 DIAGNOSIS — I5022 Chronic systolic (congestive) heart failure: Secondary | ICD-10-CM | POA: Diagnosis not present

## 2023-03-09 DIAGNOSIS — E039 Hypothyroidism, unspecified: Secondary | ICD-10-CM | POA: Diagnosis not present

## 2023-03-09 DIAGNOSIS — I255 Ischemic cardiomyopathy: Secondary | ICD-10-CM | POA: Diagnosis not present

## 2023-03-09 DIAGNOSIS — I472 Ventricular tachycardia, unspecified: Secondary | ICD-10-CM | POA: Diagnosis not present

## 2023-03-09 DIAGNOSIS — I4891 Unspecified atrial fibrillation: Secondary | ICD-10-CM | POA: Diagnosis not present

## 2023-03-12 DIAGNOSIS — I255 Ischemic cardiomyopathy: Secondary | ICD-10-CM | POA: Diagnosis not present

## 2023-03-12 DIAGNOSIS — I472 Ventricular tachycardia, unspecified: Secondary | ICD-10-CM | POA: Diagnosis not present

## 2023-03-12 DIAGNOSIS — E039 Hypothyroidism, unspecified: Secondary | ICD-10-CM | POA: Diagnosis not present

## 2023-03-12 DIAGNOSIS — I5022 Chronic systolic (congestive) heart failure: Secondary | ICD-10-CM | POA: Diagnosis not present

## 2023-03-12 DIAGNOSIS — I4891 Unspecified atrial fibrillation: Secondary | ICD-10-CM | POA: Diagnosis not present

## 2023-03-12 DIAGNOSIS — I2510 Atherosclerotic heart disease of native coronary artery without angina pectoris: Secondary | ICD-10-CM | POA: Diagnosis not present

## 2023-03-13 DIAGNOSIS — I4891 Unspecified atrial fibrillation: Secondary | ICD-10-CM | POA: Diagnosis not present

## 2023-03-13 DIAGNOSIS — I472 Ventricular tachycardia, unspecified: Secondary | ICD-10-CM | POA: Diagnosis not present

## 2023-03-13 DIAGNOSIS — I2510 Atherosclerotic heart disease of native coronary artery without angina pectoris: Secondary | ICD-10-CM | POA: Diagnosis not present

## 2023-03-13 DIAGNOSIS — E039 Hypothyroidism, unspecified: Secondary | ICD-10-CM | POA: Diagnosis not present

## 2023-03-13 DIAGNOSIS — I5022 Chronic systolic (congestive) heart failure: Secondary | ICD-10-CM | POA: Diagnosis not present

## 2023-03-13 DIAGNOSIS — I255 Ischemic cardiomyopathy: Secondary | ICD-10-CM | POA: Diagnosis not present

## 2023-03-16 DIAGNOSIS — I4891 Unspecified atrial fibrillation: Secondary | ICD-10-CM | POA: Diagnosis not present

## 2023-03-16 DIAGNOSIS — E039 Hypothyroidism, unspecified: Secondary | ICD-10-CM | POA: Diagnosis not present

## 2023-03-16 DIAGNOSIS — I255 Ischemic cardiomyopathy: Secondary | ICD-10-CM | POA: Diagnosis not present

## 2023-03-16 DIAGNOSIS — I2510 Atherosclerotic heart disease of native coronary artery without angina pectoris: Secondary | ICD-10-CM | POA: Diagnosis not present

## 2023-03-16 DIAGNOSIS — I5022 Chronic systolic (congestive) heart failure: Secondary | ICD-10-CM | POA: Diagnosis not present

## 2023-03-16 DIAGNOSIS — I472 Ventricular tachycardia, unspecified: Secondary | ICD-10-CM | POA: Diagnosis not present

## 2023-03-18 DIAGNOSIS — I472 Ventricular tachycardia, unspecified: Secondary | ICD-10-CM | POA: Diagnosis not present

## 2023-03-18 DIAGNOSIS — I2510 Atherosclerotic heart disease of native coronary artery without angina pectoris: Secondary | ICD-10-CM | POA: Diagnosis not present

## 2023-03-18 DIAGNOSIS — E039 Hypothyroidism, unspecified: Secondary | ICD-10-CM | POA: Diagnosis not present

## 2023-03-18 DIAGNOSIS — I5022 Chronic systolic (congestive) heart failure: Secondary | ICD-10-CM | POA: Diagnosis not present

## 2023-03-18 DIAGNOSIS — I4891 Unspecified atrial fibrillation: Secondary | ICD-10-CM | POA: Diagnosis not present

## 2023-03-18 DIAGNOSIS — I255 Ischemic cardiomyopathy: Secondary | ICD-10-CM | POA: Diagnosis not present

## 2023-03-19 DIAGNOSIS — I4891 Unspecified atrial fibrillation: Secondary | ICD-10-CM | POA: Diagnosis not present

## 2023-03-19 DIAGNOSIS — I255 Ischemic cardiomyopathy: Secondary | ICD-10-CM | POA: Diagnosis not present

## 2023-03-19 DIAGNOSIS — I2510 Atherosclerotic heart disease of native coronary artery without angina pectoris: Secondary | ICD-10-CM | POA: Diagnosis not present

## 2023-03-19 DIAGNOSIS — I472 Ventricular tachycardia, unspecified: Secondary | ICD-10-CM | POA: Diagnosis not present

## 2023-03-19 DIAGNOSIS — I5022 Chronic systolic (congestive) heart failure: Secondary | ICD-10-CM | POA: Diagnosis not present

## 2023-03-19 DIAGNOSIS — E039 Hypothyroidism, unspecified: Secondary | ICD-10-CM | POA: Diagnosis not present

## 2023-03-20 DIAGNOSIS — E039 Hypothyroidism, unspecified: Secondary | ICD-10-CM | POA: Diagnosis not present

## 2023-03-20 DIAGNOSIS — I255 Ischemic cardiomyopathy: Secondary | ICD-10-CM | POA: Diagnosis not present

## 2023-03-20 DIAGNOSIS — I4891 Unspecified atrial fibrillation: Secondary | ICD-10-CM | POA: Diagnosis not present

## 2023-03-20 DIAGNOSIS — I5022 Chronic systolic (congestive) heart failure: Secondary | ICD-10-CM | POA: Diagnosis not present

## 2023-03-20 DIAGNOSIS — I2510 Atherosclerotic heart disease of native coronary artery without angina pectoris: Secondary | ICD-10-CM | POA: Diagnosis not present

## 2023-03-20 DIAGNOSIS — I472 Ventricular tachycardia, unspecified: Secondary | ICD-10-CM | POA: Diagnosis not present

## 2023-03-21 ENCOUNTER — Other Ambulatory Visit: Payer: Self-pay | Admitting: Cardiovascular Disease

## 2023-03-23 DIAGNOSIS — I4891 Unspecified atrial fibrillation: Secondary | ICD-10-CM | POA: Diagnosis not present

## 2023-03-23 DIAGNOSIS — I255 Ischemic cardiomyopathy: Secondary | ICD-10-CM | POA: Diagnosis not present

## 2023-03-23 DIAGNOSIS — I472 Ventricular tachycardia, unspecified: Secondary | ICD-10-CM | POA: Diagnosis not present

## 2023-03-23 DIAGNOSIS — I5022 Chronic systolic (congestive) heart failure: Secondary | ICD-10-CM | POA: Diagnosis not present

## 2023-03-23 DIAGNOSIS — E039 Hypothyroidism, unspecified: Secondary | ICD-10-CM | POA: Diagnosis not present

## 2023-03-23 DIAGNOSIS — I2510 Atherosclerotic heart disease of native coronary artery without angina pectoris: Secondary | ICD-10-CM | POA: Diagnosis not present

## 2023-03-24 DIAGNOSIS — I2510 Atherosclerotic heart disease of native coronary artery without angina pectoris: Secondary | ICD-10-CM | POA: Diagnosis not present

## 2023-03-24 DIAGNOSIS — I4891 Unspecified atrial fibrillation: Secondary | ICD-10-CM | POA: Diagnosis not present

## 2023-03-24 DIAGNOSIS — I472 Ventricular tachycardia, unspecified: Secondary | ICD-10-CM | POA: Diagnosis not present

## 2023-03-24 DIAGNOSIS — I255 Ischemic cardiomyopathy: Secondary | ICD-10-CM | POA: Diagnosis not present

## 2023-03-24 DIAGNOSIS — I5022 Chronic systolic (congestive) heart failure: Secondary | ICD-10-CM | POA: Diagnosis not present

## 2023-03-24 DIAGNOSIS — E039 Hypothyroidism, unspecified: Secondary | ICD-10-CM | POA: Diagnosis not present

## 2023-03-26 DIAGNOSIS — I255 Ischemic cardiomyopathy: Secondary | ICD-10-CM | POA: Diagnosis not present

## 2023-03-26 DIAGNOSIS — I2510 Atherosclerotic heart disease of native coronary artery without angina pectoris: Secondary | ICD-10-CM | POA: Diagnosis not present

## 2023-03-26 DIAGNOSIS — E039 Hypothyroidism, unspecified: Secondary | ICD-10-CM | POA: Diagnosis not present

## 2023-03-26 DIAGNOSIS — I4891 Unspecified atrial fibrillation: Secondary | ICD-10-CM | POA: Diagnosis not present

## 2023-03-26 DIAGNOSIS — I472 Ventricular tachycardia, unspecified: Secondary | ICD-10-CM | POA: Diagnosis not present

## 2023-03-26 DIAGNOSIS — I5022 Chronic systolic (congestive) heart failure: Secondary | ICD-10-CM | POA: Diagnosis not present

## 2023-03-30 DIAGNOSIS — I472 Ventricular tachycardia, unspecified: Secondary | ICD-10-CM | POA: Diagnosis not present

## 2023-03-30 DIAGNOSIS — F0390 Unspecified dementia without behavioral disturbance: Secondary | ICD-10-CM | POA: Diagnosis not present

## 2023-03-30 DIAGNOSIS — I2510 Atherosclerotic heart disease of native coronary artery without angina pectoris: Secondary | ICD-10-CM | POA: Diagnosis not present

## 2023-03-30 DIAGNOSIS — E785 Hyperlipidemia, unspecified: Secondary | ICD-10-CM | POA: Diagnosis not present

## 2023-03-30 DIAGNOSIS — I255 Ischemic cardiomyopathy: Secondary | ICD-10-CM | POA: Diagnosis not present

## 2023-03-30 DIAGNOSIS — I5022 Chronic systolic (congestive) heart failure: Secondary | ICD-10-CM | POA: Diagnosis not present

## 2023-03-30 DIAGNOSIS — K4090 Unilateral inguinal hernia, without obstruction or gangrene, not specified as recurrent: Secondary | ICD-10-CM | POA: Diagnosis not present

## 2023-03-30 DIAGNOSIS — I4891 Unspecified atrial fibrillation: Secondary | ICD-10-CM | POA: Diagnosis not present

## 2023-03-30 DIAGNOSIS — E039 Hypothyroidism, unspecified: Secondary | ICD-10-CM | POA: Diagnosis not present

## 2023-04-02 DIAGNOSIS — I4891 Unspecified atrial fibrillation: Secondary | ICD-10-CM | POA: Diagnosis not present

## 2023-04-02 DIAGNOSIS — I255 Ischemic cardiomyopathy: Secondary | ICD-10-CM | POA: Diagnosis not present

## 2023-04-02 DIAGNOSIS — I472 Ventricular tachycardia, unspecified: Secondary | ICD-10-CM | POA: Diagnosis not present

## 2023-04-02 DIAGNOSIS — I5022 Chronic systolic (congestive) heart failure: Secondary | ICD-10-CM | POA: Diagnosis not present

## 2023-04-02 DIAGNOSIS — I2510 Atherosclerotic heart disease of native coronary artery without angina pectoris: Secondary | ICD-10-CM | POA: Diagnosis not present

## 2023-04-02 DIAGNOSIS — E039 Hypothyroidism, unspecified: Secondary | ICD-10-CM | POA: Diagnosis not present

## 2023-04-03 DIAGNOSIS — I255 Ischemic cardiomyopathy: Secondary | ICD-10-CM | POA: Diagnosis not present

## 2023-04-03 DIAGNOSIS — I2510 Atherosclerotic heart disease of native coronary artery without angina pectoris: Secondary | ICD-10-CM | POA: Diagnosis not present

## 2023-04-03 DIAGNOSIS — I472 Ventricular tachycardia, unspecified: Secondary | ICD-10-CM | POA: Diagnosis not present

## 2023-04-03 DIAGNOSIS — I4891 Unspecified atrial fibrillation: Secondary | ICD-10-CM | POA: Diagnosis not present

## 2023-04-03 DIAGNOSIS — I5022 Chronic systolic (congestive) heart failure: Secondary | ICD-10-CM | POA: Diagnosis not present

## 2023-04-03 DIAGNOSIS — E039 Hypothyroidism, unspecified: Secondary | ICD-10-CM | POA: Diagnosis not present

## 2023-04-06 DIAGNOSIS — I472 Ventricular tachycardia, unspecified: Secondary | ICD-10-CM | POA: Diagnosis not present

## 2023-04-06 DIAGNOSIS — E039 Hypothyroidism, unspecified: Secondary | ICD-10-CM | POA: Diagnosis not present

## 2023-04-06 DIAGNOSIS — I255 Ischemic cardiomyopathy: Secondary | ICD-10-CM | POA: Diagnosis not present

## 2023-04-06 DIAGNOSIS — I5022 Chronic systolic (congestive) heart failure: Secondary | ICD-10-CM | POA: Diagnosis not present

## 2023-04-06 DIAGNOSIS — I4891 Unspecified atrial fibrillation: Secondary | ICD-10-CM | POA: Diagnosis not present

## 2023-04-06 DIAGNOSIS — I2510 Atherosclerotic heart disease of native coronary artery without angina pectoris: Secondary | ICD-10-CM | POA: Diagnosis not present

## 2023-04-07 DIAGNOSIS — I4891 Unspecified atrial fibrillation: Secondary | ICD-10-CM | POA: Diagnosis not present

## 2023-04-07 DIAGNOSIS — I2510 Atherosclerotic heart disease of native coronary artery without angina pectoris: Secondary | ICD-10-CM | POA: Diagnosis not present

## 2023-04-07 DIAGNOSIS — I5022 Chronic systolic (congestive) heart failure: Secondary | ICD-10-CM | POA: Diagnosis not present

## 2023-04-07 DIAGNOSIS — I472 Ventricular tachycardia, unspecified: Secondary | ICD-10-CM | POA: Diagnosis not present

## 2023-04-07 DIAGNOSIS — I255 Ischemic cardiomyopathy: Secondary | ICD-10-CM | POA: Diagnosis not present

## 2023-04-07 DIAGNOSIS — E039 Hypothyroidism, unspecified: Secondary | ICD-10-CM | POA: Diagnosis not present

## 2023-04-08 DIAGNOSIS — I472 Ventricular tachycardia, unspecified: Secondary | ICD-10-CM | POA: Diagnosis not present

## 2023-04-08 DIAGNOSIS — I255 Ischemic cardiomyopathy: Secondary | ICD-10-CM | POA: Diagnosis not present

## 2023-04-08 DIAGNOSIS — I2510 Atherosclerotic heart disease of native coronary artery without angina pectoris: Secondary | ICD-10-CM | POA: Diagnosis not present

## 2023-04-08 DIAGNOSIS — I4891 Unspecified atrial fibrillation: Secondary | ICD-10-CM | POA: Diagnosis not present

## 2023-04-08 DIAGNOSIS — I5022 Chronic systolic (congestive) heart failure: Secondary | ICD-10-CM | POA: Diagnosis not present

## 2023-04-08 DIAGNOSIS — E039 Hypothyroidism, unspecified: Secondary | ICD-10-CM | POA: Diagnosis not present

## 2023-04-10 DIAGNOSIS — I2510 Atherosclerotic heart disease of native coronary artery without angina pectoris: Secondary | ICD-10-CM | POA: Diagnosis not present

## 2023-04-10 DIAGNOSIS — I255 Ischemic cardiomyopathy: Secondary | ICD-10-CM | POA: Diagnosis not present

## 2023-04-10 DIAGNOSIS — E039 Hypothyroidism, unspecified: Secondary | ICD-10-CM | POA: Diagnosis not present

## 2023-04-10 DIAGNOSIS — I4891 Unspecified atrial fibrillation: Secondary | ICD-10-CM | POA: Diagnosis not present

## 2023-04-10 DIAGNOSIS — I472 Ventricular tachycardia, unspecified: Secondary | ICD-10-CM | POA: Diagnosis not present

## 2023-04-10 DIAGNOSIS — I5022 Chronic systolic (congestive) heart failure: Secondary | ICD-10-CM | POA: Diagnosis not present

## 2023-04-13 DIAGNOSIS — I5022 Chronic systolic (congestive) heart failure: Secondary | ICD-10-CM | POA: Diagnosis not present

## 2023-04-13 DIAGNOSIS — I2510 Atherosclerotic heart disease of native coronary artery without angina pectoris: Secondary | ICD-10-CM | POA: Diagnosis not present

## 2023-04-13 DIAGNOSIS — I255 Ischemic cardiomyopathy: Secondary | ICD-10-CM | POA: Diagnosis not present

## 2023-04-13 DIAGNOSIS — I472 Ventricular tachycardia, unspecified: Secondary | ICD-10-CM | POA: Diagnosis not present

## 2023-04-13 DIAGNOSIS — E039 Hypothyroidism, unspecified: Secondary | ICD-10-CM | POA: Diagnosis not present

## 2023-04-13 DIAGNOSIS — I4891 Unspecified atrial fibrillation: Secondary | ICD-10-CM | POA: Diagnosis not present

## 2023-04-14 DIAGNOSIS — I472 Ventricular tachycardia, unspecified: Secondary | ICD-10-CM | POA: Diagnosis not present

## 2023-04-14 DIAGNOSIS — I255 Ischemic cardiomyopathy: Secondary | ICD-10-CM | POA: Diagnosis not present

## 2023-04-14 DIAGNOSIS — E039 Hypothyroidism, unspecified: Secondary | ICD-10-CM | POA: Diagnosis not present

## 2023-04-14 DIAGNOSIS — I4891 Unspecified atrial fibrillation: Secondary | ICD-10-CM | POA: Diagnosis not present

## 2023-04-14 DIAGNOSIS — I5022 Chronic systolic (congestive) heart failure: Secondary | ICD-10-CM | POA: Diagnosis not present

## 2023-04-14 DIAGNOSIS — I2510 Atherosclerotic heart disease of native coronary artery without angina pectoris: Secondary | ICD-10-CM | POA: Diagnosis not present

## 2023-04-16 DIAGNOSIS — E039 Hypothyroidism, unspecified: Secondary | ICD-10-CM | POA: Diagnosis not present

## 2023-04-16 DIAGNOSIS — I5022 Chronic systolic (congestive) heart failure: Secondary | ICD-10-CM | POA: Diagnosis not present

## 2023-04-16 DIAGNOSIS — I472 Ventricular tachycardia, unspecified: Secondary | ICD-10-CM | POA: Diagnosis not present

## 2023-04-16 DIAGNOSIS — I255 Ischemic cardiomyopathy: Secondary | ICD-10-CM | POA: Diagnosis not present

## 2023-04-16 DIAGNOSIS — I2510 Atherosclerotic heart disease of native coronary artery without angina pectoris: Secondary | ICD-10-CM | POA: Diagnosis not present

## 2023-04-16 DIAGNOSIS — I4891 Unspecified atrial fibrillation: Secondary | ICD-10-CM | POA: Diagnosis not present

## 2023-04-17 DIAGNOSIS — I4891 Unspecified atrial fibrillation: Secondary | ICD-10-CM | POA: Diagnosis not present

## 2023-04-17 DIAGNOSIS — I5022 Chronic systolic (congestive) heart failure: Secondary | ICD-10-CM | POA: Diagnosis not present

## 2023-04-17 DIAGNOSIS — I472 Ventricular tachycardia, unspecified: Secondary | ICD-10-CM | POA: Diagnosis not present

## 2023-04-17 DIAGNOSIS — I2510 Atherosclerotic heart disease of native coronary artery without angina pectoris: Secondary | ICD-10-CM | POA: Diagnosis not present

## 2023-04-17 DIAGNOSIS — E039 Hypothyroidism, unspecified: Secondary | ICD-10-CM | POA: Diagnosis not present

## 2023-04-17 DIAGNOSIS — I255 Ischemic cardiomyopathy: Secondary | ICD-10-CM | POA: Diagnosis not present

## 2023-04-20 DIAGNOSIS — I255 Ischemic cardiomyopathy: Secondary | ICD-10-CM | POA: Diagnosis not present

## 2023-04-20 DIAGNOSIS — I4891 Unspecified atrial fibrillation: Secondary | ICD-10-CM | POA: Diagnosis not present

## 2023-04-20 DIAGNOSIS — I5022 Chronic systolic (congestive) heart failure: Secondary | ICD-10-CM | POA: Diagnosis not present

## 2023-04-20 DIAGNOSIS — I472 Ventricular tachycardia, unspecified: Secondary | ICD-10-CM | POA: Diagnosis not present

## 2023-04-20 DIAGNOSIS — I2510 Atherosclerotic heart disease of native coronary artery without angina pectoris: Secondary | ICD-10-CM | POA: Diagnosis not present

## 2023-04-20 DIAGNOSIS — E039 Hypothyroidism, unspecified: Secondary | ICD-10-CM | POA: Diagnosis not present

## 2023-04-22 DIAGNOSIS — I5022 Chronic systolic (congestive) heart failure: Secondary | ICD-10-CM | POA: Diagnosis not present

## 2023-04-22 DIAGNOSIS — I2510 Atherosclerotic heart disease of native coronary artery without angina pectoris: Secondary | ICD-10-CM | POA: Diagnosis not present

## 2023-04-22 DIAGNOSIS — I472 Ventricular tachycardia, unspecified: Secondary | ICD-10-CM | POA: Diagnosis not present

## 2023-04-22 DIAGNOSIS — I255 Ischemic cardiomyopathy: Secondary | ICD-10-CM | POA: Diagnosis not present

## 2023-04-22 DIAGNOSIS — I4891 Unspecified atrial fibrillation: Secondary | ICD-10-CM | POA: Diagnosis not present

## 2023-04-22 DIAGNOSIS — E039 Hypothyroidism, unspecified: Secondary | ICD-10-CM | POA: Diagnosis not present

## 2023-04-23 DIAGNOSIS — I472 Ventricular tachycardia, unspecified: Secondary | ICD-10-CM | POA: Diagnosis not present

## 2023-04-23 DIAGNOSIS — I255 Ischemic cardiomyopathy: Secondary | ICD-10-CM | POA: Diagnosis not present

## 2023-04-23 DIAGNOSIS — I4891 Unspecified atrial fibrillation: Secondary | ICD-10-CM | POA: Diagnosis not present

## 2023-04-23 DIAGNOSIS — I5022 Chronic systolic (congestive) heart failure: Secondary | ICD-10-CM | POA: Diagnosis not present

## 2023-04-23 DIAGNOSIS — E039 Hypothyroidism, unspecified: Secondary | ICD-10-CM | POA: Diagnosis not present

## 2023-04-23 DIAGNOSIS — I2510 Atherosclerotic heart disease of native coronary artery without angina pectoris: Secondary | ICD-10-CM | POA: Diagnosis not present

## 2023-04-24 DIAGNOSIS — I5022 Chronic systolic (congestive) heart failure: Secondary | ICD-10-CM | POA: Diagnosis not present

## 2023-04-24 DIAGNOSIS — I4891 Unspecified atrial fibrillation: Secondary | ICD-10-CM | POA: Diagnosis not present

## 2023-04-24 DIAGNOSIS — E039 Hypothyroidism, unspecified: Secondary | ICD-10-CM | POA: Diagnosis not present

## 2023-04-24 DIAGNOSIS — I255 Ischemic cardiomyopathy: Secondary | ICD-10-CM | POA: Diagnosis not present

## 2023-04-24 DIAGNOSIS — I2510 Atherosclerotic heart disease of native coronary artery without angina pectoris: Secondary | ICD-10-CM | POA: Diagnosis not present

## 2023-04-24 DIAGNOSIS — I472 Ventricular tachycardia, unspecified: Secondary | ICD-10-CM | POA: Diagnosis not present

## 2023-04-28 DIAGNOSIS — I255 Ischemic cardiomyopathy: Secondary | ICD-10-CM | POA: Diagnosis not present

## 2023-04-28 DIAGNOSIS — I4891 Unspecified atrial fibrillation: Secondary | ICD-10-CM | POA: Diagnosis not present

## 2023-04-28 DIAGNOSIS — E039 Hypothyroidism, unspecified: Secondary | ICD-10-CM | POA: Diagnosis not present

## 2023-04-28 DIAGNOSIS — I5022 Chronic systolic (congestive) heart failure: Secondary | ICD-10-CM | POA: Diagnosis not present

## 2023-04-28 DIAGNOSIS — I2510 Atherosclerotic heart disease of native coronary artery without angina pectoris: Secondary | ICD-10-CM | POA: Diagnosis not present

## 2023-04-28 DIAGNOSIS — I472 Ventricular tachycardia, unspecified: Secondary | ICD-10-CM | POA: Diagnosis not present

## 2023-04-29 DIAGNOSIS — I2510 Atherosclerotic heart disease of native coronary artery without angina pectoris: Secondary | ICD-10-CM | POA: Diagnosis not present

## 2023-04-29 DIAGNOSIS — I5022 Chronic systolic (congestive) heart failure: Secondary | ICD-10-CM | POA: Diagnosis not present

## 2023-04-29 DIAGNOSIS — I4891 Unspecified atrial fibrillation: Secondary | ICD-10-CM | POA: Diagnosis not present

## 2023-04-29 DIAGNOSIS — I472 Ventricular tachycardia, unspecified: Secondary | ICD-10-CM | POA: Diagnosis not present

## 2023-04-29 DIAGNOSIS — E785 Hyperlipidemia, unspecified: Secondary | ICD-10-CM | POA: Diagnosis not present

## 2023-04-29 DIAGNOSIS — I255 Ischemic cardiomyopathy: Secondary | ICD-10-CM | POA: Diagnosis not present

## 2023-04-29 DIAGNOSIS — K4090 Unilateral inguinal hernia, without obstruction or gangrene, not specified as recurrent: Secondary | ICD-10-CM | POA: Diagnosis not present

## 2023-04-29 DIAGNOSIS — E039 Hypothyroidism, unspecified: Secondary | ICD-10-CM | POA: Diagnosis not present

## 2023-04-29 DIAGNOSIS — F0390 Unspecified dementia without behavioral disturbance: Secondary | ICD-10-CM | POA: Diagnosis not present

## 2023-04-30 DIAGNOSIS — I255 Ischemic cardiomyopathy: Secondary | ICD-10-CM | POA: Diagnosis not present

## 2023-04-30 DIAGNOSIS — I5022 Chronic systolic (congestive) heart failure: Secondary | ICD-10-CM | POA: Diagnosis not present

## 2023-04-30 DIAGNOSIS — E039 Hypothyroidism, unspecified: Secondary | ICD-10-CM | POA: Diagnosis not present

## 2023-04-30 DIAGNOSIS — I2510 Atherosclerotic heart disease of native coronary artery without angina pectoris: Secondary | ICD-10-CM | POA: Diagnosis not present

## 2023-04-30 DIAGNOSIS — I4891 Unspecified atrial fibrillation: Secondary | ICD-10-CM | POA: Diagnosis not present

## 2023-04-30 DIAGNOSIS — I472 Ventricular tachycardia, unspecified: Secondary | ICD-10-CM | POA: Diagnosis not present

## 2023-05-01 DIAGNOSIS — I255 Ischemic cardiomyopathy: Secondary | ICD-10-CM | POA: Diagnosis not present

## 2023-05-01 DIAGNOSIS — I4891 Unspecified atrial fibrillation: Secondary | ICD-10-CM | POA: Diagnosis not present

## 2023-05-01 DIAGNOSIS — I2510 Atherosclerotic heart disease of native coronary artery without angina pectoris: Secondary | ICD-10-CM | POA: Diagnosis not present

## 2023-05-01 DIAGNOSIS — E039 Hypothyroidism, unspecified: Secondary | ICD-10-CM | POA: Diagnosis not present

## 2023-05-01 DIAGNOSIS — I5022 Chronic systolic (congestive) heart failure: Secondary | ICD-10-CM | POA: Diagnosis not present

## 2023-05-01 DIAGNOSIS — I472 Ventricular tachycardia, unspecified: Secondary | ICD-10-CM | POA: Diagnosis not present

## 2023-05-02 DIAGNOSIS — E039 Hypothyroidism, unspecified: Secondary | ICD-10-CM | POA: Diagnosis not present

## 2023-05-02 DIAGNOSIS — I4891 Unspecified atrial fibrillation: Secondary | ICD-10-CM | POA: Diagnosis not present

## 2023-05-02 DIAGNOSIS — I5022 Chronic systolic (congestive) heart failure: Secondary | ICD-10-CM | POA: Diagnosis not present

## 2023-05-02 DIAGNOSIS — I2510 Atherosclerotic heart disease of native coronary artery without angina pectoris: Secondary | ICD-10-CM | POA: Diagnosis not present

## 2023-05-02 DIAGNOSIS — I472 Ventricular tachycardia, unspecified: Secondary | ICD-10-CM | POA: Diagnosis not present

## 2023-05-02 DIAGNOSIS — I255 Ischemic cardiomyopathy: Secondary | ICD-10-CM | POA: Diagnosis not present

## 2023-05-04 DIAGNOSIS — E039 Hypothyroidism, unspecified: Secondary | ICD-10-CM | POA: Diagnosis not present

## 2023-05-04 DIAGNOSIS — I4891 Unspecified atrial fibrillation: Secondary | ICD-10-CM | POA: Diagnosis not present

## 2023-05-04 DIAGNOSIS — I255 Ischemic cardiomyopathy: Secondary | ICD-10-CM | POA: Diagnosis not present

## 2023-05-04 DIAGNOSIS — I472 Ventricular tachycardia, unspecified: Secondary | ICD-10-CM | POA: Diagnosis not present

## 2023-05-04 DIAGNOSIS — I5022 Chronic systolic (congestive) heart failure: Secondary | ICD-10-CM | POA: Diagnosis not present

## 2023-05-04 DIAGNOSIS — I2510 Atherosclerotic heart disease of native coronary artery without angina pectoris: Secondary | ICD-10-CM | POA: Diagnosis not present

## 2023-05-06 DIAGNOSIS — E039 Hypothyroidism, unspecified: Secondary | ICD-10-CM | POA: Diagnosis not present

## 2023-05-06 DIAGNOSIS — I2510 Atherosclerotic heart disease of native coronary artery without angina pectoris: Secondary | ICD-10-CM | POA: Diagnosis not present

## 2023-05-06 DIAGNOSIS — I4891 Unspecified atrial fibrillation: Secondary | ICD-10-CM | POA: Diagnosis not present

## 2023-05-06 DIAGNOSIS — I5022 Chronic systolic (congestive) heart failure: Secondary | ICD-10-CM | POA: Diagnosis not present

## 2023-05-06 DIAGNOSIS — I255 Ischemic cardiomyopathy: Secondary | ICD-10-CM | POA: Diagnosis not present

## 2023-05-06 DIAGNOSIS — I472 Ventricular tachycardia, unspecified: Secondary | ICD-10-CM | POA: Diagnosis not present

## 2023-05-08 DIAGNOSIS — I5022 Chronic systolic (congestive) heart failure: Secondary | ICD-10-CM | POA: Diagnosis not present

## 2023-05-08 DIAGNOSIS — I472 Ventricular tachycardia, unspecified: Secondary | ICD-10-CM | POA: Diagnosis not present

## 2023-05-08 DIAGNOSIS — E039 Hypothyroidism, unspecified: Secondary | ICD-10-CM | POA: Diagnosis not present

## 2023-05-08 DIAGNOSIS — I2510 Atherosclerotic heart disease of native coronary artery without angina pectoris: Secondary | ICD-10-CM | POA: Diagnosis not present

## 2023-05-08 DIAGNOSIS — I255 Ischemic cardiomyopathy: Secondary | ICD-10-CM | POA: Diagnosis not present

## 2023-05-08 DIAGNOSIS — I4891 Unspecified atrial fibrillation: Secondary | ICD-10-CM | POA: Diagnosis not present

## 2023-05-11 DIAGNOSIS — I2510 Atherosclerotic heart disease of native coronary artery without angina pectoris: Secondary | ICD-10-CM | POA: Diagnosis not present

## 2023-05-11 DIAGNOSIS — I472 Ventricular tachycardia, unspecified: Secondary | ICD-10-CM | POA: Diagnosis not present

## 2023-05-11 DIAGNOSIS — I255 Ischemic cardiomyopathy: Secondary | ICD-10-CM | POA: Diagnosis not present

## 2023-05-11 DIAGNOSIS — E039 Hypothyroidism, unspecified: Secondary | ICD-10-CM | POA: Diagnosis not present

## 2023-05-11 DIAGNOSIS — I5022 Chronic systolic (congestive) heart failure: Secondary | ICD-10-CM | POA: Diagnosis not present

## 2023-05-11 DIAGNOSIS — I4891 Unspecified atrial fibrillation: Secondary | ICD-10-CM | POA: Diagnosis not present

## 2023-05-13 DIAGNOSIS — I4891 Unspecified atrial fibrillation: Secondary | ICD-10-CM | POA: Diagnosis not present

## 2023-05-13 DIAGNOSIS — I255 Ischemic cardiomyopathy: Secondary | ICD-10-CM | POA: Diagnosis not present

## 2023-05-13 DIAGNOSIS — I5022 Chronic systolic (congestive) heart failure: Secondary | ICD-10-CM | POA: Diagnosis not present

## 2023-05-13 DIAGNOSIS — I472 Ventricular tachycardia, unspecified: Secondary | ICD-10-CM | POA: Diagnosis not present

## 2023-05-13 DIAGNOSIS — E039 Hypothyroidism, unspecified: Secondary | ICD-10-CM | POA: Diagnosis not present

## 2023-05-13 DIAGNOSIS — I2510 Atherosclerotic heart disease of native coronary artery without angina pectoris: Secondary | ICD-10-CM | POA: Diagnosis not present

## 2023-05-15 DIAGNOSIS — I4891 Unspecified atrial fibrillation: Secondary | ICD-10-CM | POA: Diagnosis not present

## 2023-05-15 DIAGNOSIS — E039 Hypothyroidism, unspecified: Secondary | ICD-10-CM | POA: Diagnosis not present

## 2023-05-15 DIAGNOSIS — I5022 Chronic systolic (congestive) heart failure: Secondary | ICD-10-CM | POA: Diagnosis not present

## 2023-05-15 DIAGNOSIS — I472 Ventricular tachycardia, unspecified: Secondary | ICD-10-CM | POA: Diagnosis not present

## 2023-05-15 DIAGNOSIS — I2510 Atherosclerotic heart disease of native coronary artery without angina pectoris: Secondary | ICD-10-CM | POA: Diagnosis not present

## 2023-05-15 DIAGNOSIS — I255 Ischemic cardiomyopathy: Secondary | ICD-10-CM | POA: Diagnosis not present

## 2023-05-19 DIAGNOSIS — I5022 Chronic systolic (congestive) heart failure: Secondary | ICD-10-CM | POA: Diagnosis not present

## 2023-05-19 DIAGNOSIS — E039 Hypothyroidism, unspecified: Secondary | ICD-10-CM | POA: Diagnosis not present

## 2023-05-19 DIAGNOSIS — I472 Ventricular tachycardia, unspecified: Secondary | ICD-10-CM | POA: Diagnosis not present

## 2023-05-19 DIAGNOSIS — I2510 Atherosclerotic heart disease of native coronary artery without angina pectoris: Secondary | ICD-10-CM | POA: Diagnosis not present

## 2023-05-19 DIAGNOSIS — I4891 Unspecified atrial fibrillation: Secondary | ICD-10-CM | POA: Diagnosis not present

## 2023-05-19 DIAGNOSIS — I255 Ischemic cardiomyopathy: Secondary | ICD-10-CM | POA: Diagnosis not present

## 2023-05-20 DIAGNOSIS — I472 Ventricular tachycardia, unspecified: Secondary | ICD-10-CM | POA: Diagnosis not present

## 2023-05-20 DIAGNOSIS — I2510 Atherosclerotic heart disease of native coronary artery without angina pectoris: Secondary | ICD-10-CM | POA: Diagnosis not present

## 2023-05-20 DIAGNOSIS — I5022 Chronic systolic (congestive) heart failure: Secondary | ICD-10-CM | POA: Diagnosis not present

## 2023-05-20 DIAGNOSIS — I255 Ischemic cardiomyopathy: Secondary | ICD-10-CM | POA: Diagnosis not present

## 2023-05-20 DIAGNOSIS — E039 Hypothyroidism, unspecified: Secondary | ICD-10-CM | POA: Diagnosis not present

## 2023-05-20 DIAGNOSIS — I4891 Unspecified atrial fibrillation: Secondary | ICD-10-CM | POA: Diagnosis not present

## 2023-05-22 DIAGNOSIS — I255 Ischemic cardiomyopathy: Secondary | ICD-10-CM | POA: Diagnosis not present

## 2023-05-22 DIAGNOSIS — I472 Ventricular tachycardia, unspecified: Secondary | ICD-10-CM | POA: Diagnosis not present

## 2023-05-22 DIAGNOSIS — I2510 Atherosclerotic heart disease of native coronary artery without angina pectoris: Secondary | ICD-10-CM | POA: Diagnosis not present

## 2023-05-22 DIAGNOSIS — E039 Hypothyroidism, unspecified: Secondary | ICD-10-CM | POA: Diagnosis not present

## 2023-05-22 DIAGNOSIS — I5022 Chronic systolic (congestive) heart failure: Secondary | ICD-10-CM | POA: Diagnosis not present

## 2023-05-22 DIAGNOSIS — I4891 Unspecified atrial fibrillation: Secondary | ICD-10-CM | POA: Diagnosis not present

## 2023-05-27 DIAGNOSIS — I2510 Atherosclerotic heart disease of native coronary artery without angina pectoris: Secondary | ICD-10-CM | POA: Diagnosis not present

## 2023-05-27 DIAGNOSIS — I5022 Chronic systolic (congestive) heart failure: Secondary | ICD-10-CM | POA: Diagnosis not present

## 2023-05-27 DIAGNOSIS — I255 Ischemic cardiomyopathy: Secondary | ICD-10-CM | POA: Diagnosis not present

## 2023-05-27 DIAGNOSIS — E039 Hypothyroidism, unspecified: Secondary | ICD-10-CM | POA: Diagnosis not present

## 2023-05-27 DIAGNOSIS — I4891 Unspecified atrial fibrillation: Secondary | ICD-10-CM | POA: Diagnosis not present

## 2023-05-27 DIAGNOSIS — I472 Ventricular tachycardia, unspecified: Secondary | ICD-10-CM | POA: Diagnosis not present

## 2023-05-28 DIAGNOSIS — I4891 Unspecified atrial fibrillation: Secondary | ICD-10-CM | POA: Diagnosis not present

## 2023-05-28 DIAGNOSIS — I255 Ischemic cardiomyopathy: Secondary | ICD-10-CM | POA: Diagnosis not present

## 2023-05-28 DIAGNOSIS — I5022 Chronic systolic (congestive) heart failure: Secondary | ICD-10-CM | POA: Diagnosis not present

## 2023-05-28 DIAGNOSIS — E039 Hypothyroidism, unspecified: Secondary | ICD-10-CM | POA: Diagnosis not present

## 2023-05-28 DIAGNOSIS — I472 Ventricular tachycardia, unspecified: Secondary | ICD-10-CM | POA: Diagnosis not present

## 2023-05-28 DIAGNOSIS — I2510 Atherosclerotic heart disease of native coronary artery without angina pectoris: Secondary | ICD-10-CM | POA: Diagnosis not present

## 2023-05-29 DIAGNOSIS — I5022 Chronic systolic (congestive) heart failure: Secondary | ICD-10-CM | POA: Diagnosis not present

## 2023-05-29 DIAGNOSIS — I2510 Atherosclerotic heart disease of native coronary artery without angina pectoris: Secondary | ICD-10-CM | POA: Diagnosis not present

## 2023-05-29 DIAGNOSIS — E039 Hypothyroidism, unspecified: Secondary | ICD-10-CM | POA: Diagnosis not present

## 2023-05-29 DIAGNOSIS — I472 Ventricular tachycardia, unspecified: Secondary | ICD-10-CM | POA: Diagnosis not present

## 2023-05-29 DIAGNOSIS — I4891 Unspecified atrial fibrillation: Secondary | ICD-10-CM | POA: Diagnosis not present

## 2023-05-29 DIAGNOSIS — I255 Ischemic cardiomyopathy: Secondary | ICD-10-CM | POA: Diagnosis not present

## 2023-05-30 DIAGNOSIS — I255 Ischemic cardiomyopathy: Secondary | ICD-10-CM | POA: Diagnosis not present

## 2023-05-30 DIAGNOSIS — E039 Hypothyroidism, unspecified: Secondary | ICD-10-CM | POA: Diagnosis not present

## 2023-05-30 DIAGNOSIS — F0390 Unspecified dementia without behavioral disturbance: Secondary | ICD-10-CM | POA: Diagnosis not present

## 2023-05-30 DIAGNOSIS — I5022 Chronic systolic (congestive) heart failure: Secondary | ICD-10-CM | POA: Diagnosis not present

## 2023-05-30 DIAGNOSIS — K4090 Unilateral inguinal hernia, without obstruction or gangrene, not specified as recurrent: Secondary | ICD-10-CM | POA: Diagnosis not present

## 2023-05-30 DIAGNOSIS — I4891 Unspecified atrial fibrillation: Secondary | ICD-10-CM | POA: Diagnosis not present

## 2023-05-30 DIAGNOSIS — I472 Ventricular tachycardia, unspecified: Secondary | ICD-10-CM | POA: Diagnosis not present

## 2023-05-30 DIAGNOSIS — E785 Hyperlipidemia, unspecified: Secondary | ICD-10-CM | POA: Diagnosis not present

## 2023-05-30 DIAGNOSIS — I2510 Atherosclerotic heart disease of native coronary artery without angina pectoris: Secondary | ICD-10-CM | POA: Diagnosis not present

## 2023-06-01 DIAGNOSIS — I2510 Atherosclerotic heart disease of native coronary artery without angina pectoris: Secondary | ICD-10-CM | POA: Diagnosis not present

## 2023-06-01 DIAGNOSIS — E039 Hypothyroidism, unspecified: Secondary | ICD-10-CM | POA: Diagnosis not present

## 2023-06-01 DIAGNOSIS — I5022 Chronic systolic (congestive) heart failure: Secondary | ICD-10-CM | POA: Diagnosis not present

## 2023-06-01 DIAGNOSIS — I255 Ischemic cardiomyopathy: Secondary | ICD-10-CM | POA: Diagnosis not present

## 2023-06-01 DIAGNOSIS — I472 Ventricular tachycardia, unspecified: Secondary | ICD-10-CM | POA: Diagnosis not present

## 2023-06-01 DIAGNOSIS — I4891 Unspecified atrial fibrillation: Secondary | ICD-10-CM | POA: Diagnosis not present

## 2023-06-02 DIAGNOSIS — I255 Ischemic cardiomyopathy: Secondary | ICD-10-CM | POA: Diagnosis not present

## 2023-06-02 DIAGNOSIS — I4891 Unspecified atrial fibrillation: Secondary | ICD-10-CM | POA: Diagnosis not present

## 2023-06-02 DIAGNOSIS — I2510 Atherosclerotic heart disease of native coronary artery without angina pectoris: Secondary | ICD-10-CM | POA: Diagnosis not present

## 2023-06-02 DIAGNOSIS — I472 Ventricular tachycardia, unspecified: Secondary | ICD-10-CM | POA: Diagnosis not present

## 2023-06-02 DIAGNOSIS — I5022 Chronic systolic (congestive) heart failure: Secondary | ICD-10-CM | POA: Diagnosis not present

## 2023-06-02 DIAGNOSIS — E039 Hypothyroidism, unspecified: Secondary | ICD-10-CM | POA: Diagnosis not present

## 2023-06-03 DIAGNOSIS — I4891 Unspecified atrial fibrillation: Secondary | ICD-10-CM | POA: Diagnosis not present

## 2023-06-03 DIAGNOSIS — I255 Ischemic cardiomyopathy: Secondary | ICD-10-CM | POA: Diagnosis not present

## 2023-06-03 DIAGNOSIS — I472 Ventricular tachycardia, unspecified: Secondary | ICD-10-CM | POA: Diagnosis not present

## 2023-06-03 DIAGNOSIS — E039 Hypothyroidism, unspecified: Secondary | ICD-10-CM | POA: Diagnosis not present

## 2023-06-03 DIAGNOSIS — I5022 Chronic systolic (congestive) heart failure: Secondary | ICD-10-CM | POA: Diagnosis not present

## 2023-06-03 DIAGNOSIS — I2510 Atherosclerotic heart disease of native coronary artery without angina pectoris: Secondary | ICD-10-CM | POA: Diagnosis not present

## 2023-06-05 DIAGNOSIS — I255 Ischemic cardiomyopathy: Secondary | ICD-10-CM | POA: Diagnosis not present

## 2023-06-05 DIAGNOSIS — E039 Hypothyroidism, unspecified: Secondary | ICD-10-CM | POA: Diagnosis not present

## 2023-06-05 DIAGNOSIS — I2510 Atherosclerotic heart disease of native coronary artery without angina pectoris: Secondary | ICD-10-CM | POA: Diagnosis not present

## 2023-06-05 DIAGNOSIS — I5022 Chronic systolic (congestive) heart failure: Secondary | ICD-10-CM | POA: Diagnosis not present

## 2023-06-05 DIAGNOSIS — I4891 Unspecified atrial fibrillation: Secondary | ICD-10-CM | POA: Diagnosis not present

## 2023-06-05 DIAGNOSIS — I472 Ventricular tachycardia, unspecified: Secondary | ICD-10-CM | POA: Diagnosis not present

## 2023-06-08 DIAGNOSIS — E039 Hypothyroidism, unspecified: Secondary | ICD-10-CM | POA: Diagnosis not present

## 2023-06-08 DIAGNOSIS — I472 Ventricular tachycardia, unspecified: Secondary | ICD-10-CM | POA: Diagnosis not present

## 2023-06-08 DIAGNOSIS — I5022 Chronic systolic (congestive) heart failure: Secondary | ICD-10-CM | POA: Diagnosis not present

## 2023-06-08 DIAGNOSIS — I255 Ischemic cardiomyopathy: Secondary | ICD-10-CM | POA: Diagnosis not present

## 2023-06-08 DIAGNOSIS — I4891 Unspecified atrial fibrillation: Secondary | ICD-10-CM | POA: Diagnosis not present

## 2023-06-08 DIAGNOSIS — I2510 Atherosclerotic heart disease of native coronary artery without angina pectoris: Secondary | ICD-10-CM | POA: Diagnosis not present

## 2023-06-10 DIAGNOSIS — I255 Ischemic cardiomyopathy: Secondary | ICD-10-CM | POA: Diagnosis not present

## 2023-06-10 DIAGNOSIS — I4891 Unspecified atrial fibrillation: Secondary | ICD-10-CM | POA: Diagnosis not present

## 2023-06-10 DIAGNOSIS — I472 Ventricular tachycardia, unspecified: Secondary | ICD-10-CM | POA: Diagnosis not present

## 2023-06-10 DIAGNOSIS — E039 Hypothyroidism, unspecified: Secondary | ICD-10-CM | POA: Diagnosis not present

## 2023-06-10 DIAGNOSIS — I2510 Atherosclerotic heart disease of native coronary artery without angina pectoris: Secondary | ICD-10-CM | POA: Diagnosis not present

## 2023-06-10 DIAGNOSIS — I5022 Chronic systolic (congestive) heart failure: Secondary | ICD-10-CM | POA: Diagnosis not present

## 2023-06-11 DIAGNOSIS — E039 Hypothyroidism, unspecified: Secondary | ICD-10-CM | POA: Diagnosis not present

## 2023-06-11 DIAGNOSIS — I472 Ventricular tachycardia, unspecified: Secondary | ICD-10-CM | POA: Diagnosis not present

## 2023-06-11 DIAGNOSIS — I4891 Unspecified atrial fibrillation: Secondary | ICD-10-CM | POA: Diagnosis not present

## 2023-06-11 DIAGNOSIS — I2510 Atherosclerotic heart disease of native coronary artery without angina pectoris: Secondary | ICD-10-CM | POA: Diagnosis not present

## 2023-06-11 DIAGNOSIS — I255 Ischemic cardiomyopathy: Secondary | ICD-10-CM | POA: Diagnosis not present

## 2023-06-11 DIAGNOSIS — I5022 Chronic systolic (congestive) heart failure: Secondary | ICD-10-CM | POA: Diagnosis not present

## 2023-06-12 DIAGNOSIS — I255 Ischemic cardiomyopathy: Secondary | ICD-10-CM | POA: Diagnosis not present

## 2023-06-12 DIAGNOSIS — I472 Ventricular tachycardia, unspecified: Secondary | ICD-10-CM | POA: Diagnosis not present

## 2023-06-12 DIAGNOSIS — E039 Hypothyroidism, unspecified: Secondary | ICD-10-CM | POA: Diagnosis not present

## 2023-06-12 DIAGNOSIS — I2510 Atherosclerotic heart disease of native coronary artery without angina pectoris: Secondary | ICD-10-CM | POA: Diagnosis not present

## 2023-06-12 DIAGNOSIS — I5022 Chronic systolic (congestive) heart failure: Secondary | ICD-10-CM | POA: Diagnosis not present

## 2023-06-12 DIAGNOSIS — I4891 Unspecified atrial fibrillation: Secondary | ICD-10-CM | POA: Diagnosis not present

## 2023-06-17 DIAGNOSIS — I472 Ventricular tachycardia, unspecified: Secondary | ICD-10-CM | POA: Diagnosis not present

## 2023-06-17 DIAGNOSIS — I5022 Chronic systolic (congestive) heart failure: Secondary | ICD-10-CM | POA: Diagnosis not present

## 2023-06-17 DIAGNOSIS — I255 Ischemic cardiomyopathy: Secondary | ICD-10-CM | POA: Diagnosis not present

## 2023-06-17 DIAGNOSIS — E039 Hypothyroidism, unspecified: Secondary | ICD-10-CM | POA: Diagnosis not present

## 2023-06-17 DIAGNOSIS — I4891 Unspecified atrial fibrillation: Secondary | ICD-10-CM | POA: Diagnosis not present

## 2023-06-17 DIAGNOSIS — I2510 Atherosclerotic heart disease of native coronary artery without angina pectoris: Secondary | ICD-10-CM | POA: Diagnosis not present

## 2023-06-19 DIAGNOSIS — I255 Ischemic cardiomyopathy: Secondary | ICD-10-CM | POA: Diagnosis not present

## 2023-06-19 DIAGNOSIS — I2510 Atherosclerotic heart disease of native coronary artery without angina pectoris: Secondary | ICD-10-CM | POA: Diagnosis not present

## 2023-06-19 DIAGNOSIS — I472 Ventricular tachycardia, unspecified: Secondary | ICD-10-CM | POA: Diagnosis not present

## 2023-06-19 DIAGNOSIS — I4891 Unspecified atrial fibrillation: Secondary | ICD-10-CM | POA: Diagnosis not present

## 2023-06-19 DIAGNOSIS — E039 Hypothyroidism, unspecified: Secondary | ICD-10-CM | POA: Diagnosis not present

## 2023-06-19 DIAGNOSIS — I5022 Chronic systolic (congestive) heart failure: Secondary | ICD-10-CM | POA: Diagnosis not present

## 2023-06-23 DIAGNOSIS — I255 Ischemic cardiomyopathy: Secondary | ICD-10-CM | POA: Diagnosis not present

## 2023-06-23 DIAGNOSIS — E039 Hypothyroidism, unspecified: Secondary | ICD-10-CM | POA: Diagnosis not present

## 2023-06-23 DIAGNOSIS — I5022 Chronic systolic (congestive) heart failure: Secondary | ICD-10-CM | POA: Diagnosis not present

## 2023-06-23 DIAGNOSIS — I472 Ventricular tachycardia, unspecified: Secondary | ICD-10-CM | POA: Diagnosis not present

## 2023-06-23 DIAGNOSIS — I2510 Atherosclerotic heart disease of native coronary artery without angina pectoris: Secondary | ICD-10-CM | POA: Diagnosis not present

## 2023-06-23 DIAGNOSIS — I4891 Unspecified atrial fibrillation: Secondary | ICD-10-CM | POA: Diagnosis not present

## 2023-06-24 DIAGNOSIS — I4891 Unspecified atrial fibrillation: Secondary | ICD-10-CM | POA: Diagnosis not present

## 2023-06-24 DIAGNOSIS — I472 Ventricular tachycardia, unspecified: Secondary | ICD-10-CM | POA: Diagnosis not present

## 2023-06-24 DIAGNOSIS — I255 Ischemic cardiomyopathy: Secondary | ICD-10-CM | POA: Diagnosis not present

## 2023-06-24 DIAGNOSIS — I2510 Atherosclerotic heart disease of native coronary artery without angina pectoris: Secondary | ICD-10-CM | POA: Diagnosis not present

## 2023-06-24 DIAGNOSIS — I5022 Chronic systolic (congestive) heart failure: Secondary | ICD-10-CM | POA: Diagnosis not present

## 2023-06-24 DIAGNOSIS — E039 Hypothyroidism, unspecified: Secondary | ICD-10-CM | POA: Diagnosis not present

## 2023-06-26 DIAGNOSIS — I5022 Chronic systolic (congestive) heart failure: Secondary | ICD-10-CM | POA: Diagnosis not present

## 2023-06-26 DIAGNOSIS — I4891 Unspecified atrial fibrillation: Secondary | ICD-10-CM | POA: Diagnosis not present

## 2023-06-26 DIAGNOSIS — I2510 Atherosclerotic heart disease of native coronary artery without angina pectoris: Secondary | ICD-10-CM | POA: Diagnosis not present

## 2023-06-26 DIAGNOSIS — E039 Hypothyroidism, unspecified: Secondary | ICD-10-CM | POA: Diagnosis not present

## 2023-06-26 DIAGNOSIS — I255 Ischemic cardiomyopathy: Secondary | ICD-10-CM | POA: Diagnosis not present

## 2023-06-26 DIAGNOSIS — I472 Ventricular tachycardia, unspecified: Secondary | ICD-10-CM | POA: Diagnosis not present

## 2023-06-29 DIAGNOSIS — K4090 Unilateral inguinal hernia, without obstruction or gangrene, not specified as recurrent: Secondary | ICD-10-CM | POA: Diagnosis not present

## 2023-06-29 DIAGNOSIS — I2510 Atherosclerotic heart disease of native coronary artery without angina pectoris: Secondary | ICD-10-CM | POA: Diagnosis not present

## 2023-06-29 DIAGNOSIS — I255 Ischemic cardiomyopathy: Secondary | ICD-10-CM | POA: Diagnosis not present

## 2023-06-29 DIAGNOSIS — F0390 Unspecified dementia without behavioral disturbance: Secondary | ICD-10-CM | POA: Diagnosis not present

## 2023-06-29 DIAGNOSIS — I472 Ventricular tachycardia, unspecified: Secondary | ICD-10-CM | POA: Diagnosis not present

## 2023-06-29 DIAGNOSIS — E039 Hypothyroidism, unspecified: Secondary | ICD-10-CM | POA: Diagnosis not present

## 2023-06-29 DIAGNOSIS — I4891 Unspecified atrial fibrillation: Secondary | ICD-10-CM | POA: Diagnosis not present

## 2023-06-29 DIAGNOSIS — E785 Hyperlipidemia, unspecified: Secondary | ICD-10-CM | POA: Diagnosis not present

## 2023-06-29 DIAGNOSIS — I5022 Chronic systolic (congestive) heart failure: Secondary | ICD-10-CM | POA: Diagnosis not present

## 2023-06-30 DIAGNOSIS — I255 Ischemic cardiomyopathy: Secondary | ICD-10-CM | POA: Diagnosis not present

## 2023-06-30 DIAGNOSIS — I4891 Unspecified atrial fibrillation: Secondary | ICD-10-CM | POA: Diagnosis not present

## 2023-06-30 DIAGNOSIS — I472 Ventricular tachycardia, unspecified: Secondary | ICD-10-CM | POA: Diagnosis not present

## 2023-06-30 DIAGNOSIS — I5022 Chronic systolic (congestive) heart failure: Secondary | ICD-10-CM | POA: Diagnosis not present

## 2023-06-30 DIAGNOSIS — I2510 Atherosclerotic heart disease of native coronary artery without angina pectoris: Secondary | ICD-10-CM | POA: Diagnosis not present

## 2023-06-30 DIAGNOSIS — E039 Hypothyroidism, unspecified: Secondary | ICD-10-CM | POA: Diagnosis not present

## 2023-07-01 DIAGNOSIS — E039 Hypothyroidism, unspecified: Secondary | ICD-10-CM | POA: Diagnosis not present

## 2023-07-01 DIAGNOSIS — I5022 Chronic systolic (congestive) heart failure: Secondary | ICD-10-CM | POA: Diagnosis not present

## 2023-07-01 DIAGNOSIS — I2510 Atherosclerotic heart disease of native coronary artery without angina pectoris: Secondary | ICD-10-CM | POA: Diagnosis not present

## 2023-07-01 DIAGNOSIS — I4891 Unspecified atrial fibrillation: Secondary | ICD-10-CM | POA: Diagnosis not present

## 2023-07-01 DIAGNOSIS — I255 Ischemic cardiomyopathy: Secondary | ICD-10-CM | POA: Diagnosis not present

## 2023-07-01 DIAGNOSIS — I472 Ventricular tachycardia, unspecified: Secondary | ICD-10-CM | POA: Diagnosis not present

## 2023-07-07 DIAGNOSIS — E039 Hypothyroidism, unspecified: Secondary | ICD-10-CM | POA: Diagnosis not present

## 2023-07-07 DIAGNOSIS — I5022 Chronic systolic (congestive) heart failure: Secondary | ICD-10-CM | POA: Diagnosis not present

## 2023-07-07 DIAGNOSIS — I4891 Unspecified atrial fibrillation: Secondary | ICD-10-CM | POA: Diagnosis not present

## 2023-07-07 DIAGNOSIS — I255 Ischemic cardiomyopathy: Secondary | ICD-10-CM | POA: Diagnosis not present

## 2023-07-07 DIAGNOSIS — I2510 Atherosclerotic heart disease of native coronary artery without angina pectoris: Secondary | ICD-10-CM | POA: Diagnosis not present

## 2023-07-07 DIAGNOSIS — I472 Ventricular tachycardia, unspecified: Secondary | ICD-10-CM | POA: Diagnosis not present

## 2023-07-08 DIAGNOSIS — I5022 Chronic systolic (congestive) heart failure: Secondary | ICD-10-CM | POA: Diagnosis not present

## 2023-07-08 DIAGNOSIS — I255 Ischemic cardiomyopathy: Secondary | ICD-10-CM | POA: Diagnosis not present

## 2023-07-08 DIAGNOSIS — I4891 Unspecified atrial fibrillation: Secondary | ICD-10-CM | POA: Diagnosis not present

## 2023-07-08 DIAGNOSIS — E039 Hypothyroidism, unspecified: Secondary | ICD-10-CM | POA: Diagnosis not present

## 2023-07-08 DIAGNOSIS — I2510 Atherosclerotic heart disease of native coronary artery without angina pectoris: Secondary | ICD-10-CM | POA: Diagnosis not present

## 2023-07-08 DIAGNOSIS — I472 Ventricular tachycardia, unspecified: Secondary | ICD-10-CM | POA: Diagnosis not present

## 2023-07-10 DIAGNOSIS — I4891 Unspecified atrial fibrillation: Secondary | ICD-10-CM | POA: Diagnosis not present

## 2023-07-10 DIAGNOSIS — I2510 Atherosclerotic heart disease of native coronary artery without angina pectoris: Secondary | ICD-10-CM | POA: Diagnosis not present

## 2023-07-10 DIAGNOSIS — E039 Hypothyroidism, unspecified: Secondary | ICD-10-CM | POA: Diagnosis not present

## 2023-07-10 DIAGNOSIS — I472 Ventricular tachycardia, unspecified: Secondary | ICD-10-CM | POA: Diagnosis not present

## 2023-07-10 DIAGNOSIS — I255 Ischemic cardiomyopathy: Secondary | ICD-10-CM | POA: Diagnosis not present

## 2023-07-10 DIAGNOSIS — I5022 Chronic systolic (congestive) heart failure: Secondary | ICD-10-CM | POA: Diagnosis not present

## 2023-07-12 DIAGNOSIS — I5022 Chronic systolic (congestive) heart failure: Secondary | ICD-10-CM | POA: Diagnosis not present

## 2023-07-12 DIAGNOSIS — I255 Ischemic cardiomyopathy: Secondary | ICD-10-CM | POA: Diagnosis not present

## 2023-07-12 DIAGNOSIS — I472 Ventricular tachycardia, unspecified: Secondary | ICD-10-CM | POA: Diagnosis not present

## 2023-07-12 DIAGNOSIS — I4891 Unspecified atrial fibrillation: Secondary | ICD-10-CM | POA: Diagnosis not present

## 2023-07-12 DIAGNOSIS — E039 Hypothyroidism, unspecified: Secondary | ICD-10-CM | POA: Diagnosis not present

## 2023-07-12 DIAGNOSIS — I2510 Atherosclerotic heart disease of native coronary artery without angina pectoris: Secondary | ICD-10-CM | POA: Diagnosis not present

## 2023-07-15 DIAGNOSIS — I2510 Atherosclerotic heart disease of native coronary artery without angina pectoris: Secondary | ICD-10-CM | POA: Diagnosis not present

## 2023-07-15 DIAGNOSIS — I255 Ischemic cardiomyopathy: Secondary | ICD-10-CM | POA: Diagnosis not present

## 2023-07-15 DIAGNOSIS — E039 Hypothyroidism, unspecified: Secondary | ICD-10-CM | POA: Diagnosis not present

## 2023-07-15 DIAGNOSIS — I472 Ventricular tachycardia, unspecified: Secondary | ICD-10-CM | POA: Diagnosis not present

## 2023-07-15 DIAGNOSIS — I5022 Chronic systolic (congestive) heart failure: Secondary | ICD-10-CM | POA: Diagnosis not present

## 2023-07-15 DIAGNOSIS — I4891 Unspecified atrial fibrillation: Secondary | ICD-10-CM | POA: Diagnosis not present

## 2023-07-17 DIAGNOSIS — I5022 Chronic systolic (congestive) heart failure: Secondary | ICD-10-CM | POA: Diagnosis not present

## 2023-07-17 DIAGNOSIS — I472 Ventricular tachycardia, unspecified: Secondary | ICD-10-CM | POA: Diagnosis not present

## 2023-07-17 DIAGNOSIS — E039 Hypothyroidism, unspecified: Secondary | ICD-10-CM | POA: Diagnosis not present

## 2023-07-17 DIAGNOSIS — I4891 Unspecified atrial fibrillation: Secondary | ICD-10-CM | POA: Diagnosis not present

## 2023-07-17 DIAGNOSIS — I255 Ischemic cardiomyopathy: Secondary | ICD-10-CM | POA: Diagnosis not present

## 2023-07-17 DIAGNOSIS — I2510 Atherosclerotic heart disease of native coronary artery without angina pectoris: Secondary | ICD-10-CM | POA: Diagnosis not present

## 2023-07-20 DIAGNOSIS — I472 Ventricular tachycardia, unspecified: Secondary | ICD-10-CM | POA: Diagnosis not present

## 2023-07-20 DIAGNOSIS — I4891 Unspecified atrial fibrillation: Secondary | ICD-10-CM | POA: Diagnosis not present

## 2023-07-20 DIAGNOSIS — I255 Ischemic cardiomyopathy: Secondary | ICD-10-CM | POA: Diagnosis not present

## 2023-07-20 DIAGNOSIS — E039 Hypothyroidism, unspecified: Secondary | ICD-10-CM | POA: Diagnosis not present

## 2023-07-20 DIAGNOSIS — I5022 Chronic systolic (congestive) heart failure: Secondary | ICD-10-CM | POA: Diagnosis not present

## 2023-07-20 DIAGNOSIS — I2510 Atherosclerotic heart disease of native coronary artery without angina pectoris: Secondary | ICD-10-CM | POA: Diagnosis not present

## 2023-07-22 DIAGNOSIS — I5022 Chronic systolic (congestive) heart failure: Secondary | ICD-10-CM | POA: Diagnosis not present

## 2023-07-22 DIAGNOSIS — I2510 Atherosclerotic heart disease of native coronary artery without angina pectoris: Secondary | ICD-10-CM | POA: Diagnosis not present

## 2023-07-22 DIAGNOSIS — I255 Ischemic cardiomyopathy: Secondary | ICD-10-CM | POA: Diagnosis not present

## 2023-07-22 DIAGNOSIS — I4891 Unspecified atrial fibrillation: Secondary | ICD-10-CM | POA: Diagnosis not present

## 2023-07-22 DIAGNOSIS — E039 Hypothyroidism, unspecified: Secondary | ICD-10-CM | POA: Diagnosis not present

## 2023-07-22 DIAGNOSIS — I472 Ventricular tachycardia, unspecified: Secondary | ICD-10-CM | POA: Diagnosis not present

## 2023-07-28 DIAGNOSIS — I5022 Chronic systolic (congestive) heart failure: Secondary | ICD-10-CM | POA: Diagnosis not present

## 2023-07-28 DIAGNOSIS — E039 Hypothyroidism, unspecified: Secondary | ICD-10-CM | POA: Diagnosis not present

## 2023-07-28 DIAGNOSIS — I2510 Atherosclerotic heart disease of native coronary artery without angina pectoris: Secondary | ICD-10-CM | POA: Diagnosis not present

## 2023-07-28 DIAGNOSIS — I4891 Unspecified atrial fibrillation: Secondary | ICD-10-CM | POA: Diagnosis not present

## 2023-07-28 DIAGNOSIS — I472 Ventricular tachycardia, unspecified: Secondary | ICD-10-CM | POA: Diagnosis not present

## 2023-07-28 DIAGNOSIS — I255 Ischemic cardiomyopathy: Secondary | ICD-10-CM | POA: Diagnosis not present

## 2023-07-29 DIAGNOSIS — I2510 Atherosclerotic heart disease of native coronary artery without angina pectoris: Secondary | ICD-10-CM | POA: Diagnosis not present

## 2023-07-29 DIAGNOSIS — I472 Ventricular tachycardia, unspecified: Secondary | ICD-10-CM | POA: Diagnosis not present

## 2023-07-29 DIAGNOSIS — E039 Hypothyroidism, unspecified: Secondary | ICD-10-CM | POA: Diagnosis not present

## 2023-07-29 DIAGNOSIS — I5022 Chronic systolic (congestive) heart failure: Secondary | ICD-10-CM | POA: Diagnosis not present

## 2023-07-29 DIAGNOSIS — I4891 Unspecified atrial fibrillation: Secondary | ICD-10-CM | POA: Diagnosis not present

## 2023-07-29 DIAGNOSIS — I255 Ischemic cardiomyopathy: Secondary | ICD-10-CM | POA: Diagnosis not present

## 2023-07-30 DIAGNOSIS — I4891 Unspecified atrial fibrillation: Secondary | ICD-10-CM | POA: Diagnosis not present

## 2023-07-30 DIAGNOSIS — I472 Ventricular tachycardia, unspecified: Secondary | ICD-10-CM | POA: Diagnosis not present

## 2023-07-30 DIAGNOSIS — I255 Ischemic cardiomyopathy: Secondary | ICD-10-CM | POA: Diagnosis not present

## 2023-07-30 DIAGNOSIS — F0390 Unspecified dementia without behavioral disturbance: Secondary | ICD-10-CM | POA: Diagnosis not present

## 2023-07-30 DIAGNOSIS — I5022 Chronic systolic (congestive) heart failure: Secondary | ICD-10-CM | POA: Diagnosis not present

## 2023-07-30 DIAGNOSIS — E785 Hyperlipidemia, unspecified: Secondary | ICD-10-CM | POA: Diagnosis not present

## 2023-07-30 DIAGNOSIS — I2510 Atherosclerotic heart disease of native coronary artery without angina pectoris: Secondary | ICD-10-CM | POA: Diagnosis not present

## 2023-07-30 DIAGNOSIS — E039 Hypothyroidism, unspecified: Secondary | ICD-10-CM | POA: Diagnosis not present

## 2023-07-30 DIAGNOSIS — K4090 Unilateral inguinal hernia, without obstruction or gangrene, not specified as recurrent: Secondary | ICD-10-CM | POA: Diagnosis not present

## 2023-07-31 DIAGNOSIS — E039 Hypothyroidism, unspecified: Secondary | ICD-10-CM | POA: Diagnosis not present

## 2023-07-31 DIAGNOSIS — I5022 Chronic systolic (congestive) heart failure: Secondary | ICD-10-CM | POA: Diagnosis not present

## 2023-07-31 DIAGNOSIS — I2510 Atherosclerotic heart disease of native coronary artery without angina pectoris: Secondary | ICD-10-CM | POA: Diagnosis not present

## 2023-07-31 DIAGNOSIS — I472 Ventricular tachycardia, unspecified: Secondary | ICD-10-CM | POA: Diagnosis not present

## 2023-07-31 DIAGNOSIS — I255 Ischemic cardiomyopathy: Secondary | ICD-10-CM | POA: Diagnosis not present

## 2023-07-31 DIAGNOSIS — I4891 Unspecified atrial fibrillation: Secondary | ICD-10-CM | POA: Diagnosis not present

## 2023-08-03 DIAGNOSIS — I5022 Chronic systolic (congestive) heart failure: Secondary | ICD-10-CM | POA: Diagnosis not present

## 2023-08-03 DIAGNOSIS — I2510 Atherosclerotic heart disease of native coronary artery without angina pectoris: Secondary | ICD-10-CM | POA: Diagnosis not present

## 2023-08-03 DIAGNOSIS — I472 Ventricular tachycardia, unspecified: Secondary | ICD-10-CM | POA: Diagnosis not present

## 2023-08-03 DIAGNOSIS — E039 Hypothyroidism, unspecified: Secondary | ICD-10-CM | POA: Diagnosis not present

## 2023-08-03 DIAGNOSIS — I255 Ischemic cardiomyopathy: Secondary | ICD-10-CM | POA: Diagnosis not present

## 2023-08-03 DIAGNOSIS — I4891 Unspecified atrial fibrillation: Secondary | ICD-10-CM | POA: Diagnosis not present

## 2023-08-05 DIAGNOSIS — I5022 Chronic systolic (congestive) heart failure: Secondary | ICD-10-CM | POA: Diagnosis not present

## 2023-08-05 DIAGNOSIS — I2510 Atherosclerotic heart disease of native coronary artery without angina pectoris: Secondary | ICD-10-CM | POA: Diagnosis not present

## 2023-08-05 DIAGNOSIS — E039 Hypothyroidism, unspecified: Secondary | ICD-10-CM | POA: Diagnosis not present

## 2023-08-05 DIAGNOSIS — I255 Ischemic cardiomyopathy: Secondary | ICD-10-CM | POA: Diagnosis not present

## 2023-08-05 DIAGNOSIS — I472 Ventricular tachycardia, unspecified: Secondary | ICD-10-CM | POA: Diagnosis not present

## 2023-08-05 DIAGNOSIS — I4891 Unspecified atrial fibrillation: Secondary | ICD-10-CM | POA: Diagnosis not present

## 2023-08-07 DIAGNOSIS — I4891 Unspecified atrial fibrillation: Secondary | ICD-10-CM | POA: Diagnosis not present

## 2023-08-07 DIAGNOSIS — I255 Ischemic cardiomyopathy: Secondary | ICD-10-CM | POA: Diagnosis not present

## 2023-08-07 DIAGNOSIS — E039 Hypothyroidism, unspecified: Secondary | ICD-10-CM | POA: Diagnosis not present

## 2023-08-07 DIAGNOSIS — I5022 Chronic systolic (congestive) heart failure: Secondary | ICD-10-CM | POA: Diagnosis not present

## 2023-08-07 DIAGNOSIS — I472 Ventricular tachycardia, unspecified: Secondary | ICD-10-CM | POA: Diagnosis not present

## 2023-08-07 DIAGNOSIS — I2510 Atherosclerotic heart disease of native coronary artery without angina pectoris: Secondary | ICD-10-CM | POA: Diagnosis not present

## 2023-08-10 DIAGNOSIS — I2510 Atherosclerotic heart disease of native coronary artery without angina pectoris: Secondary | ICD-10-CM | POA: Diagnosis not present

## 2023-08-10 DIAGNOSIS — I255 Ischemic cardiomyopathy: Secondary | ICD-10-CM | POA: Diagnosis not present

## 2023-08-10 DIAGNOSIS — I4891 Unspecified atrial fibrillation: Secondary | ICD-10-CM | POA: Diagnosis not present

## 2023-08-10 DIAGNOSIS — I5022 Chronic systolic (congestive) heart failure: Secondary | ICD-10-CM | POA: Diagnosis not present

## 2023-08-10 DIAGNOSIS — E039 Hypothyroidism, unspecified: Secondary | ICD-10-CM | POA: Diagnosis not present

## 2023-08-10 DIAGNOSIS — I472 Ventricular tachycardia, unspecified: Secondary | ICD-10-CM | POA: Diagnosis not present

## 2023-08-12 DIAGNOSIS — I255 Ischemic cardiomyopathy: Secondary | ICD-10-CM | POA: Diagnosis not present

## 2023-08-12 DIAGNOSIS — I5022 Chronic systolic (congestive) heart failure: Secondary | ICD-10-CM | POA: Diagnosis not present

## 2023-08-12 DIAGNOSIS — I2510 Atherosclerotic heart disease of native coronary artery without angina pectoris: Secondary | ICD-10-CM | POA: Diagnosis not present

## 2023-08-12 DIAGNOSIS — I472 Ventricular tachycardia, unspecified: Secondary | ICD-10-CM | POA: Diagnosis not present

## 2023-08-12 DIAGNOSIS — E039 Hypothyroidism, unspecified: Secondary | ICD-10-CM | POA: Diagnosis not present

## 2023-08-12 DIAGNOSIS — I4891 Unspecified atrial fibrillation: Secondary | ICD-10-CM | POA: Diagnosis not present

## 2023-08-14 DIAGNOSIS — I472 Ventricular tachycardia, unspecified: Secondary | ICD-10-CM | POA: Diagnosis not present

## 2023-08-14 DIAGNOSIS — I4891 Unspecified atrial fibrillation: Secondary | ICD-10-CM | POA: Diagnosis not present

## 2023-08-14 DIAGNOSIS — I5022 Chronic systolic (congestive) heart failure: Secondary | ICD-10-CM | POA: Diagnosis not present

## 2023-08-14 DIAGNOSIS — I255 Ischemic cardiomyopathy: Secondary | ICD-10-CM | POA: Diagnosis not present

## 2023-08-14 DIAGNOSIS — E039 Hypothyroidism, unspecified: Secondary | ICD-10-CM | POA: Diagnosis not present

## 2023-08-14 DIAGNOSIS — I2510 Atherosclerotic heart disease of native coronary artery without angina pectoris: Secondary | ICD-10-CM | POA: Diagnosis not present

## 2023-08-18 DIAGNOSIS — I255 Ischemic cardiomyopathy: Secondary | ICD-10-CM | POA: Diagnosis not present

## 2023-08-18 DIAGNOSIS — I4891 Unspecified atrial fibrillation: Secondary | ICD-10-CM | POA: Diagnosis not present

## 2023-08-18 DIAGNOSIS — E039 Hypothyroidism, unspecified: Secondary | ICD-10-CM | POA: Diagnosis not present

## 2023-08-18 DIAGNOSIS — I5022 Chronic systolic (congestive) heart failure: Secondary | ICD-10-CM | POA: Diagnosis not present

## 2023-08-18 DIAGNOSIS — I472 Ventricular tachycardia, unspecified: Secondary | ICD-10-CM | POA: Diagnosis not present

## 2023-08-18 DIAGNOSIS — I2510 Atherosclerotic heart disease of native coronary artery without angina pectoris: Secondary | ICD-10-CM | POA: Diagnosis not present

## 2023-08-19 DIAGNOSIS — I255 Ischemic cardiomyopathy: Secondary | ICD-10-CM | POA: Diagnosis not present

## 2023-08-19 DIAGNOSIS — E039 Hypothyroidism, unspecified: Secondary | ICD-10-CM | POA: Diagnosis not present

## 2023-08-19 DIAGNOSIS — I472 Ventricular tachycardia, unspecified: Secondary | ICD-10-CM | POA: Diagnosis not present

## 2023-08-19 DIAGNOSIS — I4891 Unspecified atrial fibrillation: Secondary | ICD-10-CM | POA: Diagnosis not present

## 2023-08-19 DIAGNOSIS — I5022 Chronic systolic (congestive) heart failure: Secondary | ICD-10-CM | POA: Diagnosis not present

## 2023-08-19 DIAGNOSIS — I2510 Atherosclerotic heart disease of native coronary artery without angina pectoris: Secondary | ICD-10-CM | POA: Diagnosis not present

## 2023-08-21 DIAGNOSIS — E039 Hypothyroidism, unspecified: Secondary | ICD-10-CM | POA: Diagnosis not present

## 2023-08-21 DIAGNOSIS — I2510 Atherosclerotic heart disease of native coronary artery without angina pectoris: Secondary | ICD-10-CM | POA: Diagnosis not present

## 2023-08-21 DIAGNOSIS — I4891 Unspecified atrial fibrillation: Secondary | ICD-10-CM | POA: Diagnosis not present

## 2023-08-21 DIAGNOSIS — I5022 Chronic systolic (congestive) heart failure: Secondary | ICD-10-CM | POA: Diagnosis not present

## 2023-08-21 DIAGNOSIS — I255 Ischemic cardiomyopathy: Secondary | ICD-10-CM | POA: Diagnosis not present

## 2023-08-21 DIAGNOSIS — I472 Ventricular tachycardia, unspecified: Secondary | ICD-10-CM | POA: Diagnosis not present

## 2023-08-26 DIAGNOSIS — I255 Ischemic cardiomyopathy: Secondary | ICD-10-CM | POA: Diagnosis not present

## 2023-08-26 DIAGNOSIS — I4891 Unspecified atrial fibrillation: Secondary | ICD-10-CM | POA: Diagnosis not present

## 2023-08-26 DIAGNOSIS — I5022 Chronic systolic (congestive) heart failure: Secondary | ICD-10-CM | POA: Diagnosis not present

## 2023-08-26 DIAGNOSIS — E039 Hypothyroidism, unspecified: Secondary | ICD-10-CM | POA: Diagnosis not present

## 2023-08-26 DIAGNOSIS — I2510 Atherosclerotic heart disease of native coronary artery without angina pectoris: Secondary | ICD-10-CM | POA: Diagnosis not present

## 2023-08-26 DIAGNOSIS — I472 Ventricular tachycardia, unspecified: Secondary | ICD-10-CM | POA: Diagnosis not present

## 2023-08-28 DIAGNOSIS — E039 Hypothyroidism, unspecified: Secondary | ICD-10-CM | POA: Diagnosis not present

## 2023-08-28 DIAGNOSIS — I4891 Unspecified atrial fibrillation: Secondary | ICD-10-CM | POA: Diagnosis not present

## 2023-08-28 DIAGNOSIS — I2510 Atherosclerotic heart disease of native coronary artery without angina pectoris: Secondary | ICD-10-CM | POA: Diagnosis not present

## 2023-08-28 DIAGNOSIS — I255 Ischemic cardiomyopathy: Secondary | ICD-10-CM | POA: Diagnosis not present

## 2023-08-28 DIAGNOSIS — I472 Ventricular tachycardia, unspecified: Secondary | ICD-10-CM | POA: Diagnosis not present

## 2023-08-28 DIAGNOSIS — I5022 Chronic systolic (congestive) heart failure: Secondary | ICD-10-CM | POA: Diagnosis not present

## 2023-08-30 DIAGNOSIS — E785 Hyperlipidemia, unspecified: Secondary | ICD-10-CM | POA: Diagnosis not present

## 2023-08-30 DIAGNOSIS — I255 Ischemic cardiomyopathy: Secondary | ICD-10-CM | POA: Diagnosis not present

## 2023-08-30 DIAGNOSIS — I472 Ventricular tachycardia, unspecified: Secondary | ICD-10-CM | POA: Diagnosis not present

## 2023-08-30 DIAGNOSIS — E039 Hypothyroidism, unspecified: Secondary | ICD-10-CM | POA: Diagnosis not present

## 2023-08-30 DIAGNOSIS — I2510 Atherosclerotic heart disease of native coronary artery without angina pectoris: Secondary | ICD-10-CM | POA: Diagnosis not present

## 2023-08-30 DIAGNOSIS — F0390 Unspecified dementia without behavioral disturbance: Secondary | ICD-10-CM | POA: Diagnosis not present

## 2023-08-30 DIAGNOSIS — K4090 Unilateral inguinal hernia, without obstruction or gangrene, not specified as recurrent: Secondary | ICD-10-CM | POA: Diagnosis not present

## 2023-08-30 DIAGNOSIS — I5022 Chronic systolic (congestive) heart failure: Secondary | ICD-10-CM | POA: Diagnosis not present

## 2023-08-30 DIAGNOSIS — I4891 Unspecified atrial fibrillation: Secondary | ICD-10-CM | POA: Diagnosis not present

## 2023-09-02 DIAGNOSIS — I2510 Atherosclerotic heart disease of native coronary artery without angina pectoris: Secondary | ICD-10-CM | POA: Diagnosis not present

## 2023-09-02 DIAGNOSIS — I472 Ventricular tachycardia, unspecified: Secondary | ICD-10-CM | POA: Diagnosis not present

## 2023-09-02 DIAGNOSIS — I5022 Chronic systolic (congestive) heart failure: Secondary | ICD-10-CM | POA: Diagnosis not present

## 2023-09-02 DIAGNOSIS — I255 Ischemic cardiomyopathy: Secondary | ICD-10-CM | POA: Diagnosis not present

## 2023-09-02 DIAGNOSIS — I4891 Unspecified atrial fibrillation: Secondary | ICD-10-CM | POA: Diagnosis not present

## 2023-09-02 DIAGNOSIS — E039 Hypothyroidism, unspecified: Secondary | ICD-10-CM | POA: Diagnosis not present

## 2023-09-03 DIAGNOSIS — E039 Hypothyroidism, unspecified: Secondary | ICD-10-CM | POA: Diagnosis not present

## 2023-09-03 DIAGNOSIS — I5022 Chronic systolic (congestive) heart failure: Secondary | ICD-10-CM | POA: Diagnosis not present

## 2023-09-03 DIAGNOSIS — I4891 Unspecified atrial fibrillation: Secondary | ICD-10-CM | POA: Diagnosis not present

## 2023-09-03 DIAGNOSIS — I255 Ischemic cardiomyopathy: Secondary | ICD-10-CM | POA: Diagnosis not present

## 2023-09-03 DIAGNOSIS — I2510 Atherosclerotic heart disease of native coronary artery without angina pectoris: Secondary | ICD-10-CM | POA: Diagnosis not present

## 2023-09-03 DIAGNOSIS — I472 Ventricular tachycardia, unspecified: Secondary | ICD-10-CM | POA: Diagnosis not present

## 2023-09-04 DIAGNOSIS — I255 Ischemic cardiomyopathy: Secondary | ICD-10-CM | POA: Diagnosis not present

## 2023-09-04 DIAGNOSIS — E039 Hypothyroidism, unspecified: Secondary | ICD-10-CM | POA: Diagnosis not present

## 2023-09-04 DIAGNOSIS — I2510 Atherosclerotic heart disease of native coronary artery without angina pectoris: Secondary | ICD-10-CM | POA: Diagnosis not present

## 2023-09-04 DIAGNOSIS — I4891 Unspecified atrial fibrillation: Secondary | ICD-10-CM | POA: Diagnosis not present

## 2023-09-04 DIAGNOSIS — I472 Ventricular tachycardia, unspecified: Secondary | ICD-10-CM | POA: Diagnosis not present

## 2023-09-04 DIAGNOSIS — I5022 Chronic systolic (congestive) heart failure: Secondary | ICD-10-CM | POA: Diagnosis not present

## 2023-09-08 DIAGNOSIS — I255 Ischemic cardiomyopathy: Secondary | ICD-10-CM | POA: Diagnosis not present

## 2023-09-08 DIAGNOSIS — I2510 Atherosclerotic heart disease of native coronary artery without angina pectoris: Secondary | ICD-10-CM | POA: Diagnosis not present

## 2023-09-08 DIAGNOSIS — E039 Hypothyroidism, unspecified: Secondary | ICD-10-CM | POA: Diagnosis not present

## 2023-09-08 DIAGNOSIS — I5022 Chronic systolic (congestive) heart failure: Secondary | ICD-10-CM | POA: Diagnosis not present

## 2023-09-08 DIAGNOSIS — I472 Ventricular tachycardia, unspecified: Secondary | ICD-10-CM | POA: Diagnosis not present

## 2023-09-08 DIAGNOSIS — I4891 Unspecified atrial fibrillation: Secondary | ICD-10-CM | POA: Diagnosis not present

## 2023-09-09 DIAGNOSIS — I5022 Chronic systolic (congestive) heart failure: Secondary | ICD-10-CM | POA: Diagnosis not present

## 2023-09-09 DIAGNOSIS — I255 Ischemic cardiomyopathy: Secondary | ICD-10-CM | POA: Diagnosis not present

## 2023-09-09 DIAGNOSIS — I4891 Unspecified atrial fibrillation: Secondary | ICD-10-CM | POA: Diagnosis not present

## 2023-09-09 DIAGNOSIS — E039 Hypothyroidism, unspecified: Secondary | ICD-10-CM | POA: Diagnosis not present

## 2023-09-09 DIAGNOSIS — I472 Ventricular tachycardia, unspecified: Secondary | ICD-10-CM | POA: Diagnosis not present

## 2023-09-09 DIAGNOSIS — I2510 Atherosclerotic heart disease of native coronary artery without angina pectoris: Secondary | ICD-10-CM | POA: Diagnosis not present

## 2023-09-11 DIAGNOSIS — E039 Hypothyroidism, unspecified: Secondary | ICD-10-CM | POA: Diagnosis not present

## 2023-09-11 DIAGNOSIS — I2510 Atherosclerotic heart disease of native coronary artery without angina pectoris: Secondary | ICD-10-CM | POA: Diagnosis not present

## 2023-09-11 DIAGNOSIS — I255 Ischemic cardiomyopathy: Secondary | ICD-10-CM | POA: Diagnosis not present

## 2023-09-11 DIAGNOSIS — I5022 Chronic systolic (congestive) heart failure: Secondary | ICD-10-CM | POA: Diagnosis not present

## 2023-09-11 DIAGNOSIS — I4891 Unspecified atrial fibrillation: Secondary | ICD-10-CM | POA: Diagnosis not present

## 2023-09-11 DIAGNOSIS — I472 Ventricular tachycardia, unspecified: Secondary | ICD-10-CM | POA: Diagnosis not present

## 2023-09-14 DIAGNOSIS — E039 Hypothyroidism, unspecified: Secondary | ICD-10-CM | POA: Diagnosis not present

## 2023-09-14 DIAGNOSIS — I5022 Chronic systolic (congestive) heart failure: Secondary | ICD-10-CM | POA: Diagnosis not present

## 2023-09-14 DIAGNOSIS — I2510 Atherosclerotic heart disease of native coronary artery without angina pectoris: Secondary | ICD-10-CM | POA: Diagnosis not present

## 2023-09-14 DIAGNOSIS — I4891 Unspecified atrial fibrillation: Secondary | ICD-10-CM | POA: Diagnosis not present

## 2023-09-14 DIAGNOSIS — I255 Ischemic cardiomyopathy: Secondary | ICD-10-CM | POA: Diagnosis not present

## 2023-09-14 DIAGNOSIS — I472 Ventricular tachycardia, unspecified: Secondary | ICD-10-CM | POA: Diagnosis not present

## 2023-09-16 DIAGNOSIS — I4891 Unspecified atrial fibrillation: Secondary | ICD-10-CM | POA: Diagnosis not present

## 2023-09-16 DIAGNOSIS — E039 Hypothyroidism, unspecified: Secondary | ICD-10-CM | POA: Diagnosis not present

## 2023-09-16 DIAGNOSIS — I2510 Atherosclerotic heart disease of native coronary artery without angina pectoris: Secondary | ICD-10-CM | POA: Diagnosis not present

## 2023-09-16 DIAGNOSIS — I255 Ischemic cardiomyopathy: Secondary | ICD-10-CM | POA: Diagnosis not present

## 2023-09-16 DIAGNOSIS — I5022 Chronic systolic (congestive) heart failure: Secondary | ICD-10-CM | POA: Diagnosis not present

## 2023-09-16 DIAGNOSIS — I472 Ventricular tachycardia, unspecified: Secondary | ICD-10-CM | POA: Diagnosis not present

## 2023-09-18 DIAGNOSIS — I2510 Atherosclerotic heart disease of native coronary artery without angina pectoris: Secondary | ICD-10-CM | POA: Diagnosis not present

## 2023-09-18 DIAGNOSIS — I255 Ischemic cardiomyopathy: Secondary | ICD-10-CM | POA: Diagnosis not present

## 2023-09-18 DIAGNOSIS — I5022 Chronic systolic (congestive) heart failure: Secondary | ICD-10-CM | POA: Diagnosis not present

## 2023-09-18 DIAGNOSIS — I4891 Unspecified atrial fibrillation: Secondary | ICD-10-CM | POA: Diagnosis not present

## 2023-09-18 DIAGNOSIS — E039 Hypothyroidism, unspecified: Secondary | ICD-10-CM | POA: Diagnosis not present

## 2023-09-18 DIAGNOSIS — I472 Ventricular tachycardia, unspecified: Secondary | ICD-10-CM | POA: Diagnosis not present

## 2023-09-23 DIAGNOSIS — E039 Hypothyroidism, unspecified: Secondary | ICD-10-CM | POA: Diagnosis not present

## 2023-09-23 DIAGNOSIS — I472 Ventricular tachycardia, unspecified: Secondary | ICD-10-CM | POA: Diagnosis not present

## 2023-09-23 DIAGNOSIS — I4891 Unspecified atrial fibrillation: Secondary | ICD-10-CM | POA: Diagnosis not present

## 2023-09-23 DIAGNOSIS — I2510 Atherosclerotic heart disease of native coronary artery without angina pectoris: Secondary | ICD-10-CM | POA: Diagnosis not present

## 2023-09-23 DIAGNOSIS — I255 Ischemic cardiomyopathy: Secondary | ICD-10-CM | POA: Diagnosis not present

## 2023-09-23 DIAGNOSIS — I5022 Chronic systolic (congestive) heart failure: Secondary | ICD-10-CM | POA: Diagnosis not present

## 2023-09-25 DIAGNOSIS — E039 Hypothyroidism, unspecified: Secondary | ICD-10-CM | POA: Diagnosis not present

## 2023-09-25 DIAGNOSIS — I255 Ischemic cardiomyopathy: Secondary | ICD-10-CM | POA: Diagnosis not present

## 2023-09-25 DIAGNOSIS — I5022 Chronic systolic (congestive) heart failure: Secondary | ICD-10-CM | POA: Diagnosis not present

## 2023-09-25 DIAGNOSIS — I4891 Unspecified atrial fibrillation: Secondary | ICD-10-CM | POA: Diagnosis not present

## 2023-09-25 DIAGNOSIS — I2510 Atherosclerotic heart disease of native coronary artery without angina pectoris: Secondary | ICD-10-CM | POA: Diagnosis not present

## 2023-09-25 DIAGNOSIS — I472 Ventricular tachycardia, unspecified: Secondary | ICD-10-CM | POA: Diagnosis not present

## 2023-09-29 DIAGNOSIS — F0390 Unspecified dementia without behavioral disturbance: Secondary | ICD-10-CM | POA: Diagnosis not present

## 2023-09-29 DIAGNOSIS — I2510 Atherosclerotic heart disease of native coronary artery without angina pectoris: Secondary | ICD-10-CM | POA: Diagnosis not present

## 2023-09-29 DIAGNOSIS — E039 Hypothyroidism, unspecified: Secondary | ICD-10-CM | POA: Diagnosis not present

## 2023-09-29 DIAGNOSIS — I5022 Chronic systolic (congestive) heart failure: Secondary | ICD-10-CM | POA: Diagnosis not present

## 2023-09-29 DIAGNOSIS — E785 Hyperlipidemia, unspecified: Secondary | ICD-10-CM | POA: Diagnosis not present

## 2023-09-29 DIAGNOSIS — K4090 Unilateral inguinal hernia, without obstruction or gangrene, not specified as recurrent: Secondary | ICD-10-CM | POA: Diagnosis not present

## 2023-09-29 DIAGNOSIS — I4891 Unspecified atrial fibrillation: Secondary | ICD-10-CM | POA: Diagnosis not present

## 2023-09-29 DIAGNOSIS — I472 Ventricular tachycardia, unspecified: Secondary | ICD-10-CM | POA: Diagnosis not present

## 2023-09-29 DIAGNOSIS — I255 Ischemic cardiomyopathy: Secondary | ICD-10-CM | POA: Diagnosis not present

## 2023-09-30 DIAGNOSIS — Z23 Encounter for immunization: Secondary | ICD-10-CM | POA: Diagnosis not present

## 2023-09-30 DIAGNOSIS — E039 Hypothyroidism, unspecified: Secondary | ICD-10-CM | POA: Diagnosis not present

## 2023-09-30 DIAGNOSIS — I5022 Chronic systolic (congestive) heart failure: Secondary | ICD-10-CM | POA: Diagnosis not present

## 2023-09-30 DIAGNOSIS — I4891 Unspecified atrial fibrillation: Secondary | ICD-10-CM | POA: Diagnosis not present

## 2023-09-30 DIAGNOSIS — I2510 Atherosclerotic heart disease of native coronary artery without angina pectoris: Secondary | ICD-10-CM | POA: Diagnosis not present

## 2023-09-30 DIAGNOSIS — I472 Ventricular tachycardia, unspecified: Secondary | ICD-10-CM | POA: Diagnosis not present

## 2023-09-30 DIAGNOSIS — I255 Ischemic cardiomyopathy: Secondary | ICD-10-CM | POA: Diagnosis not present

## 2023-10-02 DIAGNOSIS — I472 Ventricular tachycardia, unspecified: Secondary | ICD-10-CM | POA: Diagnosis not present

## 2023-10-02 DIAGNOSIS — I4891 Unspecified atrial fibrillation: Secondary | ICD-10-CM | POA: Diagnosis not present

## 2023-10-02 DIAGNOSIS — I2510 Atherosclerotic heart disease of native coronary artery without angina pectoris: Secondary | ICD-10-CM | POA: Diagnosis not present

## 2023-10-02 DIAGNOSIS — E039 Hypothyroidism, unspecified: Secondary | ICD-10-CM | POA: Diagnosis not present

## 2023-10-02 DIAGNOSIS — I5022 Chronic systolic (congestive) heart failure: Secondary | ICD-10-CM | POA: Diagnosis not present

## 2023-10-02 DIAGNOSIS — I255 Ischemic cardiomyopathy: Secondary | ICD-10-CM | POA: Diagnosis not present

## 2023-10-07 DIAGNOSIS — I4891 Unspecified atrial fibrillation: Secondary | ICD-10-CM | POA: Diagnosis not present

## 2023-10-07 DIAGNOSIS — I2510 Atherosclerotic heart disease of native coronary artery without angina pectoris: Secondary | ICD-10-CM | POA: Diagnosis not present

## 2023-10-07 DIAGNOSIS — E039 Hypothyroidism, unspecified: Secondary | ICD-10-CM | POA: Diagnosis not present

## 2023-10-07 DIAGNOSIS — I5022 Chronic systolic (congestive) heart failure: Secondary | ICD-10-CM | POA: Diagnosis not present

## 2023-10-07 DIAGNOSIS — I472 Ventricular tachycardia, unspecified: Secondary | ICD-10-CM | POA: Diagnosis not present

## 2023-10-07 DIAGNOSIS — I255 Ischemic cardiomyopathy: Secondary | ICD-10-CM | POA: Diagnosis not present

## 2023-10-08 DIAGNOSIS — I255 Ischemic cardiomyopathy: Secondary | ICD-10-CM | POA: Diagnosis not present

## 2023-10-08 DIAGNOSIS — I2510 Atherosclerotic heart disease of native coronary artery without angina pectoris: Secondary | ICD-10-CM | POA: Diagnosis not present

## 2023-10-08 DIAGNOSIS — I5022 Chronic systolic (congestive) heart failure: Secondary | ICD-10-CM | POA: Diagnosis not present

## 2023-10-08 DIAGNOSIS — I472 Ventricular tachycardia, unspecified: Secondary | ICD-10-CM | POA: Diagnosis not present

## 2023-10-08 DIAGNOSIS — E039 Hypothyroidism, unspecified: Secondary | ICD-10-CM | POA: Diagnosis not present

## 2023-10-08 DIAGNOSIS — I4891 Unspecified atrial fibrillation: Secondary | ICD-10-CM | POA: Diagnosis not present

## 2023-10-09 DIAGNOSIS — E039 Hypothyroidism, unspecified: Secondary | ICD-10-CM | POA: Diagnosis not present

## 2023-10-09 DIAGNOSIS — I255 Ischemic cardiomyopathy: Secondary | ICD-10-CM | POA: Diagnosis not present

## 2023-10-09 DIAGNOSIS — I2510 Atherosclerotic heart disease of native coronary artery without angina pectoris: Secondary | ICD-10-CM | POA: Diagnosis not present

## 2023-10-09 DIAGNOSIS — I472 Ventricular tachycardia, unspecified: Secondary | ICD-10-CM | POA: Diagnosis not present

## 2023-10-09 DIAGNOSIS — I4891 Unspecified atrial fibrillation: Secondary | ICD-10-CM | POA: Diagnosis not present

## 2023-10-09 DIAGNOSIS — I5022 Chronic systolic (congestive) heart failure: Secondary | ICD-10-CM | POA: Diagnosis not present

## 2023-10-14 DIAGNOSIS — I2510 Atherosclerotic heart disease of native coronary artery without angina pectoris: Secondary | ICD-10-CM | POA: Diagnosis not present

## 2023-10-14 DIAGNOSIS — I5022 Chronic systolic (congestive) heart failure: Secondary | ICD-10-CM | POA: Diagnosis not present

## 2023-10-14 DIAGNOSIS — I255 Ischemic cardiomyopathy: Secondary | ICD-10-CM | POA: Diagnosis not present

## 2023-10-14 DIAGNOSIS — I4891 Unspecified atrial fibrillation: Secondary | ICD-10-CM | POA: Diagnosis not present

## 2023-10-14 DIAGNOSIS — I472 Ventricular tachycardia, unspecified: Secondary | ICD-10-CM | POA: Diagnosis not present

## 2023-10-14 DIAGNOSIS — E039 Hypothyroidism, unspecified: Secondary | ICD-10-CM | POA: Diagnosis not present

## 2023-10-16 DIAGNOSIS — I4891 Unspecified atrial fibrillation: Secondary | ICD-10-CM | POA: Diagnosis not present

## 2023-10-16 DIAGNOSIS — I255 Ischemic cardiomyopathy: Secondary | ICD-10-CM | POA: Diagnosis not present

## 2023-10-16 DIAGNOSIS — E039 Hypothyroidism, unspecified: Secondary | ICD-10-CM | POA: Diagnosis not present

## 2023-10-16 DIAGNOSIS — I2510 Atherosclerotic heart disease of native coronary artery without angina pectoris: Secondary | ICD-10-CM | POA: Diagnosis not present

## 2023-10-16 DIAGNOSIS — I472 Ventricular tachycardia, unspecified: Secondary | ICD-10-CM | POA: Diagnosis not present

## 2023-10-16 DIAGNOSIS — I5022 Chronic systolic (congestive) heart failure: Secondary | ICD-10-CM | POA: Diagnosis not present

## 2023-10-21 DIAGNOSIS — I5022 Chronic systolic (congestive) heart failure: Secondary | ICD-10-CM | POA: Diagnosis not present

## 2023-10-21 DIAGNOSIS — I2510 Atherosclerotic heart disease of native coronary artery without angina pectoris: Secondary | ICD-10-CM | POA: Diagnosis not present

## 2023-10-21 DIAGNOSIS — I472 Ventricular tachycardia, unspecified: Secondary | ICD-10-CM | POA: Diagnosis not present

## 2023-10-21 DIAGNOSIS — I4891 Unspecified atrial fibrillation: Secondary | ICD-10-CM | POA: Diagnosis not present

## 2023-10-21 DIAGNOSIS — E039 Hypothyroidism, unspecified: Secondary | ICD-10-CM | POA: Diagnosis not present

## 2023-10-21 DIAGNOSIS — I255 Ischemic cardiomyopathy: Secondary | ICD-10-CM | POA: Diagnosis not present

## 2023-10-23 DIAGNOSIS — I2510 Atherosclerotic heart disease of native coronary artery without angina pectoris: Secondary | ICD-10-CM | POA: Diagnosis not present

## 2023-10-23 DIAGNOSIS — I472 Ventricular tachycardia, unspecified: Secondary | ICD-10-CM | POA: Diagnosis not present

## 2023-10-23 DIAGNOSIS — I5022 Chronic systolic (congestive) heart failure: Secondary | ICD-10-CM | POA: Diagnosis not present

## 2023-10-23 DIAGNOSIS — I4891 Unspecified atrial fibrillation: Secondary | ICD-10-CM | POA: Diagnosis not present

## 2023-10-23 DIAGNOSIS — E039 Hypothyroidism, unspecified: Secondary | ICD-10-CM | POA: Diagnosis not present

## 2023-10-23 DIAGNOSIS — I255 Ischemic cardiomyopathy: Secondary | ICD-10-CM | POA: Diagnosis not present

## 2023-10-28 DIAGNOSIS — I5022 Chronic systolic (congestive) heart failure: Secondary | ICD-10-CM | POA: Diagnosis not present

## 2023-10-28 DIAGNOSIS — I2510 Atherosclerotic heart disease of native coronary artery without angina pectoris: Secondary | ICD-10-CM | POA: Diagnosis not present

## 2023-10-28 DIAGNOSIS — E039 Hypothyroidism, unspecified: Secondary | ICD-10-CM | POA: Diagnosis not present

## 2023-10-28 DIAGNOSIS — I472 Ventricular tachycardia, unspecified: Secondary | ICD-10-CM | POA: Diagnosis not present

## 2023-10-28 DIAGNOSIS — I4891 Unspecified atrial fibrillation: Secondary | ICD-10-CM | POA: Diagnosis not present

## 2023-10-28 DIAGNOSIS — I255 Ischemic cardiomyopathy: Secondary | ICD-10-CM | POA: Diagnosis not present

## 2023-10-29 DIAGNOSIS — I2510 Atherosclerotic heart disease of native coronary artery without angina pectoris: Secondary | ICD-10-CM | POA: Diagnosis not present

## 2023-10-29 DIAGNOSIS — I255 Ischemic cardiomyopathy: Secondary | ICD-10-CM | POA: Diagnosis not present

## 2023-10-29 DIAGNOSIS — I5022 Chronic systolic (congestive) heart failure: Secondary | ICD-10-CM | POA: Diagnosis not present

## 2023-10-29 DIAGNOSIS — I4891 Unspecified atrial fibrillation: Secondary | ICD-10-CM | POA: Diagnosis not present

## 2023-10-29 DIAGNOSIS — E039 Hypothyroidism, unspecified: Secondary | ICD-10-CM | POA: Diagnosis not present

## 2023-10-29 DIAGNOSIS — I472 Ventricular tachycardia, unspecified: Secondary | ICD-10-CM | POA: Diagnosis not present

## 2023-10-30 DIAGNOSIS — I2510 Atherosclerotic heart disease of native coronary artery without angina pectoris: Secondary | ICD-10-CM | POA: Diagnosis not present

## 2023-10-30 DIAGNOSIS — I472 Ventricular tachycardia, unspecified: Secondary | ICD-10-CM | POA: Diagnosis not present

## 2023-10-30 DIAGNOSIS — E039 Hypothyroidism, unspecified: Secondary | ICD-10-CM | POA: Diagnosis not present

## 2023-10-30 DIAGNOSIS — E785 Hyperlipidemia, unspecified: Secondary | ICD-10-CM | POA: Diagnosis not present

## 2023-10-30 DIAGNOSIS — I4891 Unspecified atrial fibrillation: Secondary | ICD-10-CM | POA: Diagnosis not present

## 2023-10-30 DIAGNOSIS — K4090 Unilateral inguinal hernia, without obstruction or gangrene, not specified as recurrent: Secondary | ICD-10-CM | POA: Diagnosis not present

## 2023-10-30 DIAGNOSIS — F0390 Unspecified dementia without behavioral disturbance: Secondary | ICD-10-CM | POA: Diagnosis not present

## 2023-10-30 DIAGNOSIS — I5022 Chronic systolic (congestive) heart failure: Secondary | ICD-10-CM | POA: Diagnosis not present

## 2023-10-30 DIAGNOSIS — I255 Ischemic cardiomyopathy: Secondary | ICD-10-CM | POA: Diagnosis not present

## 2023-11-03 DIAGNOSIS — I2510 Atherosclerotic heart disease of native coronary artery without angina pectoris: Secondary | ICD-10-CM | POA: Diagnosis not present

## 2023-11-03 DIAGNOSIS — I4891 Unspecified atrial fibrillation: Secondary | ICD-10-CM | POA: Diagnosis not present

## 2023-11-03 DIAGNOSIS — I5022 Chronic systolic (congestive) heart failure: Secondary | ICD-10-CM | POA: Diagnosis not present

## 2023-11-03 DIAGNOSIS — E039 Hypothyroidism, unspecified: Secondary | ICD-10-CM | POA: Diagnosis not present

## 2023-11-03 DIAGNOSIS — I472 Ventricular tachycardia, unspecified: Secondary | ICD-10-CM | POA: Diagnosis not present

## 2023-11-03 DIAGNOSIS — I255 Ischemic cardiomyopathy: Secondary | ICD-10-CM | POA: Diagnosis not present

## 2023-11-04 DIAGNOSIS — I4891 Unspecified atrial fibrillation: Secondary | ICD-10-CM | POA: Diagnosis not present

## 2023-11-04 DIAGNOSIS — I2510 Atherosclerotic heart disease of native coronary artery without angina pectoris: Secondary | ICD-10-CM | POA: Diagnosis not present

## 2023-11-04 DIAGNOSIS — E039 Hypothyroidism, unspecified: Secondary | ICD-10-CM | POA: Diagnosis not present

## 2023-11-04 DIAGNOSIS — I255 Ischemic cardiomyopathy: Secondary | ICD-10-CM | POA: Diagnosis not present

## 2023-11-04 DIAGNOSIS — I5022 Chronic systolic (congestive) heart failure: Secondary | ICD-10-CM | POA: Diagnosis not present

## 2023-11-04 DIAGNOSIS — I472 Ventricular tachycardia, unspecified: Secondary | ICD-10-CM | POA: Diagnosis not present

## 2023-11-06 DIAGNOSIS — E039 Hypothyroidism, unspecified: Secondary | ICD-10-CM | POA: Diagnosis not present

## 2023-11-06 DIAGNOSIS — I2510 Atherosclerotic heart disease of native coronary artery without angina pectoris: Secondary | ICD-10-CM | POA: Diagnosis not present

## 2023-11-06 DIAGNOSIS — I472 Ventricular tachycardia, unspecified: Secondary | ICD-10-CM | POA: Diagnosis not present

## 2023-11-06 DIAGNOSIS — I255 Ischemic cardiomyopathy: Secondary | ICD-10-CM | POA: Diagnosis not present

## 2023-11-06 DIAGNOSIS — I4891 Unspecified atrial fibrillation: Secondary | ICD-10-CM | POA: Diagnosis not present

## 2023-11-06 DIAGNOSIS — I5022 Chronic systolic (congestive) heart failure: Secondary | ICD-10-CM | POA: Diagnosis not present

## 2023-11-11 DIAGNOSIS — I472 Ventricular tachycardia, unspecified: Secondary | ICD-10-CM | POA: Diagnosis not present

## 2023-11-11 DIAGNOSIS — I2510 Atherosclerotic heart disease of native coronary artery without angina pectoris: Secondary | ICD-10-CM | POA: Diagnosis not present

## 2023-11-11 DIAGNOSIS — E039 Hypothyroidism, unspecified: Secondary | ICD-10-CM | POA: Diagnosis not present

## 2023-11-11 DIAGNOSIS — I255 Ischemic cardiomyopathy: Secondary | ICD-10-CM | POA: Diagnosis not present

## 2023-11-11 DIAGNOSIS — I4891 Unspecified atrial fibrillation: Secondary | ICD-10-CM | POA: Diagnosis not present

## 2023-11-11 DIAGNOSIS — I5022 Chronic systolic (congestive) heart failure: Secondary | ICD-10-CM | POA: Diagnosis not present

## 2023-11-13 DIAGNOSIS — E039 Hypothyroidism, unspecified: Secondary | ICD-10-CM | POA: Diagnosis not present

## 2023-11-13 DIAGNOSIS — I4891 Unspecified atrial fibrillation: Secondary | ICD-10-CM | POA: Diagnosis not present

## 2023-11-13 DIAGNOSIS — I2510 Atherosclerotic heart disease of native coronary artery without angina pectoris: Secondary | ICD-10-CM | POA: Diagnosis not present

## 2023-11-13 DIAGNOSIS — I472 Ventricular tachycardia, unspecified: Secondary | ICD-10-CM | POA: Diagnosis not present

## 2023-11-13 DIAGNOSIS — I5022 Chronic systolic (congestive) heart failure: Secondary | ICD-10-CM | POA: Diagnosis not present

## 2023-11-13 DIAGNOSIS — I255 Ischemic cardiomyopathy: Secondary | ICD-10-CM | POA: Diagnosis not present

## 2023-11-18 DIAGNOSIS — I472 Ventricular tachycardia, unspecified: Secondary | ICD-10-CM | POA: Diagnosis not present

## 2023-11-18 DIAGNOSIS — I5022 Chronic systolic (congestive) heart failure: Secondary | ICD-10-CM | POA: Diagnosis not present

## 2023-11-18 DIAGNOSIS — I4891 Unspecified atrial fibrillation: Secondary | ICD-10-CM | POA: Diagnosis not present

## 2023-11-18 DIAGNOSIS — I2510 Atherosclerotic heart disease of native coronary artery without angina pectoris: Secondary | ICD-10-CM | POA: Diagnosis not present

## 2023-11-18 DIAGNOSIS — I255 Ischemic cardiomyopathy: Secondary | ICD-10-CM | POA: Diagnosis not present

## 2023-11-18 DIAGNOSIS — E039 Hypothyroidism, unspecified: Secondary | ICD-10-CM | POA: Diagnosis not present

## 2023-11-20 DIAGNOSIS — E039 Hypothyroidism, unspecified: Secondary | ICD-10-CM | POA: Diagnosis not present

## 2023-11-20 DIAGNOSIS — I2510 Atherosclerotic heart disease of native coronary artery without angina pectoris: Secondary | ICD-10-CM | POA: Diagnosis not present

## 2023-11-20 DIAGNOSIS — I472 Ventricular tachycardia, unspecified: Secondary | ICD-10-CM | POA: Diagnosis not present

## 2023-11-20 DIAGNOSIS — I255 Ischemic cardiomyopathy: Secondary | ICD-10-CM | POA: Diagnosis not present

## 2023-11-20 DIAGNOSIS — I5022 Chronic systolic (congestive) heart failure: Secondary | ICD-10-CM | POA: Diagnosis not present

## 2023-11-20 DIAGNOSIS — I4891 Unspecified atrial fibrillation: Secondary | ICD-10-CM | POA: Diagnosis not present

## 2023-11-24 DIAGNOSIS — I4891 Unspecified atrial fibrillation: Secondary | ICD-10-CM | POA: Diagnosis not present

## 2023-11-24 DIAGNOSIS — E039 Hypothyroidism, unspecified: Secondary | ICD-10-CM | POA: Diagnosis not present

## 2023-11-24 DIAGNOSIS — I2510 Atherosclerotic heart disease of native coronary artery without angina pectoris: Secondary | ICD-10-CM | POA: Diagnosis not present

## 2023-11-24 DIAGNOSIS — I5022 Chronic systolic (congestive) heart failure: Secondary | ICD-10-CM | POA: Diagnosis not present

## 2023-11-24 DIAGNOSIS — I472 Ventricular tachycardia, unspecified: Secondary | ICD-10-CM | POA: Diagnosis not present

## 2023-11-24 DIAGNOSIS — I255 Ischemic cardiomyopathy: Secondary | ICD-10-CM | POA: Diagnosis not present

## 2023-11-27 DIAGNOSIS — E039 Hypothyroidism, unspecified: Secondary | ICD-10-CM | POA: Diagnosis not present

## 2023-11-27 DIAGNOSIS — I255 Ischemic cardiomyopathy: Secondary | ICD-10-CM | POA: Diagnosis not present

## 2023-11-27 DIAGNOSIS — I5022 Chronic systolic (congestive) heart failure: Secondary | ICD-10-CM | POA: Diagnosis not present

## 2023-11-27 DIAGNOSIS — I4891 Unspecified atrial fibrillation: Secondary | ICD-10-CM | POA: Diagnosis not present

## 2023-11-27 DIAGNOSIS — I2510 Atherosclerotic heart disease of native coronary artery without angina pectoris: Secondary | ICD-10-CM | POA: Diagnosis not present

## 2023-11-27 DIAGNOSIS — I472 Ventricular tachycardia, unspecified: Secondary | ICD-10-CM | POA: Diagnosis not present

## 2023-11-29 DIAGNOSIS — I255 Ischemic cardiomyopathy: Secondary | ICD-10-CM | POA: Diagnosis not present

## 2023-11-29 DIAGNOSIS — I4891 Unspecified atrial fibrillation: Secondary | ICD-10-CM | POA: Diagnosis not present

## 2023-11-29 DIAGNOSIS — F0390 Unspecified dementia without behavioral disturbance: Secondary | ICD-10-CM | POA: Diagnosis not present

## 2023-11-29 DIAGNOSIS — E785 Hyperlipidemia, unspecified: Secondary | ICD-10-CM | POA: Diagnosis not present

## 2023-11-29 DIAGNOSIS — I5022 Chronic systolic (congestive) heart failure: Secondary | ICD-10-CM | POA: Diagnosis not present

## 2023-11-29 DIAGNOSIS — K4090 Unilateral inguinal hernia, without obstruction or gangrene, not specified as recurrent: Secondary | ICD-10-CM | POA: Diagnosis not present

## 2023-11-29 DIAGNOSIS — I472 Ventricular tachycardia, unspecified: Secondary | ICD-10-CM | POA: Diagnosis not present

## 2023-11-29 DIAGNOSIS — E039 Hypothyroidism, unspecified: Secondary | ICD-10-CM | POA: Diagnosis not present

## 2023-11-29 DIAGNOSIS — I2510 Atherosclerotic heart disease of native coronary artery without angina pectoris: Secondary | ICD-10-CM | POA: Diagnosis not present

## 2023-12-01 DIAGNOSIS — I255 Ischemic cardiomyopathy: Secondary | ICD-10-CM | POA: Diagnosis not present

## 2023-12-01 DIAGNOSIS — I5022 Chronic systolic (congestive) heart failure: Secondary | ICD-10-CM | POA: Diagnosis not present

## 2023-12-01 DIAGNOSIS — I4891 Unspecified atrial fibrillation: Secondary | ICD-10-CM | POA: Diagnosis not present

## 2023-12-01 DIAGNOSIS — I472 Ventricular tachycardia, unspecified: Secondary | ICD-10-CM | POA: Diagnosis not present

## 2023-12-01 DIAGNOSIS — E039 Hypothyroidism, unspecified: Secondary | ICD-10-CM | POA: Diagnosis not present

## 2023-12-01 DIAGNOSIS — I2510 Atherosclerotic heart disease of native coronary artery without angina pectoris: Secondary | ICD-10-CM | POA: Diagnosis not present

## 2023-12-02 DIAGNOSIS — I4891 Unspecified atrial fibrillation: Secondary | ICD-10-CM | POA: Diagnosis not present

## 2023-12-02 DIAGNOSIS — E039 Hypothyroidism, unspecified: Secondary | ICD-10-CM | POA: Diagnosis not present

## 2023-12-02 DIAGNOSIS — I472 Ventricular tachycardia, unspecified: Secondary | ICD-10-CM | POA: Diagnosis not present

## 2023-12-02 DIAGNOSIS — I5022 Chronic systolic (congestive) heart failure: Secondary | ICD-10-CM | POA: Diagnosis not present

## 2023-12-02 DIAGNOSIS — I2510 Atherosclerotic heart disease of native coronary artery without angina pectoris: Secondary | ICD-10-CM | POA: Diagnosis not present

## 2023-12-02 DIAGNOSIS — I255 Ischemic cardiomyopathy: Secondary | ICD-10-CM | POA: Diagnosis not present

## 2023-12-04 DIAGNOSIS — E039 Hypothyroidism, unspecified: Secondary | ICD-10-CM | POA: Diagnosis not present

## 2023-12-04 DIAGNOSIS — I255 Ischemic cardiomyopathy: Secondary | ICD-10-CM | POA: Diagnosis not present

## 2023-12-04 DIAGNOSIS — I2510 Atherosclerotic heart disease of native coronary artery without angina pectoris: Secondary | ICD-10-CM | POA: Diagnosis not present

## 2023-12-04 DIAGNOSIS — I472 Ventricular tachycardia, unspecified: Secondary | ICD-10-CM | POA: Diagnosis not present

## 2023-12-04 DIAGNOSIS — I5022 Chronic systolic (congestive) heart failure: Secondary | ICD-10-CM | POA: Diagnosis not present

## 2023-12-04 DIAGNOSIS — I4891 Unspecified atrial fibrillation: Secondary | ICD-10-CM | POA: Diagnosis not present

## 2023-12-08 DIAGNOSIS — I2510 Atherosclerotic heart disease of native coronary artery without angina pectoris: Secondary | ICD-10-CM | POA: Diagnosis not present

## 2023-12-08 DIAGNOSIS — I5022 Chronic systolic (congestive) heart failure: Secondary | ICD-10-CM | POA: Diagnosis not present

## 2023-12-08 DIAGNOSIS — E039 Hypothyroidism, unspecified: Secondary | ICD-10-CM | POA: Diagnosis not present

## 2023-12-08 DIAGNOSIS — I255 Ischemic cardiomyopathy: Secondary | ICD-10-CM | POA: Diagnosis not present

## 2023-12-08 DIAGNOSIS — I472 Ventricular tachycardia, unspecified: Secondary | ICD-10-CM | POA: Diagnosis not present

## 2023-12-08 DIAGNOSIS — I4891 Unspecified atrial fibrillation: Secondary | ICD-10-CM | POA: Diagnosis not present

## 2023-12-09 DIAGNOSIS — I2510 Atherosclerotic heart disease of native coronary artery without angina pectoris: Secondary | ICD-10-CM | POA: Diagnosis not present

## 2023-12-09 DIAGNOSIS — I472 Ventricular tachycardia, unspecified: Secondary | ICD-10-CM | POA: Diagnosis not present

## 2023-12-09 DIAGNOSIS — E039 Hypothyroidism, unspecified: Secondary | ICD-10-CM | POA: Diagnosis not present

## 2023-12-09 DIAGNOSIS — I5022 Chronic systolic (congestive) heart failure: Secondary | ICD-10-CM | POA: Diagnosis not present

## 2023-12-09 DIAGNOSIS — I255 Ischemic cardiomyopathy: Secondary | ICD-10-CM | POA: Diagnosis not present

## 2023-12-09 DIAGNOSIS — I4891 Unspecified atrial fibrillation: Secondary | ICD-10-CM | POA: Diagnosis not present

## 2023-12-11 DIAGNOSIS — I255 Ischemic cardiomyopathy: Secondary | ICD-10-CM | POA: Diagnosis not present

## 2023-12-11 DIAGNOSIS — I5022 Chronic systolic (congestive) heart failure: Secondary | ICD-10-CM | POA: Diagnosis not present

## 2023-12-11 DIAGNOSIS — I4891 Unspecified atrial fibrillation: Secondary | ICD-10-CM | POA: Diagnosis not present

## 2023-12-11 DIAGNOSIS — E039 Hypothyroidism, unspecified: Secondary | ICD-10-CM | POA: Diagnosis not present

## 2023-12-11 DIAGNOSIS — I2510 Atherosclerotic heart disease of native coronary artery without angina pectoris: Secondary | ICD-10-CM | POA: Diagnosis not present

## 2023-12-11 DIAGNOSIS — I472 Ventricular tachycardia, unspecified: Secondary | ICD-10-CM | POA: Diagnosis not present

## 2023-12-15 DIAGNOSIS — I2510 Atherosclerotic heart disease of native coronary artery without angina pectoris: Secondary | ICD-10-CM | POA: Diagnosis not present

## 2023-12-15 DIAGNOSIS — E039 Hypothyroidism, unspecified: Secondary | ICD-10-CM | POA: Diagnosis not present

## 2023-12-15 DIAGNOSIS — I4891 Unspecified atrial fibrillation: Secondary | ICD-10-CM | POA: Diagnosis not present

## 2023-12-15 DIAGNOSIS — I472 Ventricular tachycardia, unspecified: Secondary | ICD-10-CM | POA: Diagnosis not present

## 2023-12-15 DIAGNOSIS — I5022 Chronic systolic (congestive) heart failure: Secondary | ICD-10-CM | POA: Diagnosis not present

## 2023-12-15 DIAGNOSIS — I255 Ischemic cardiomyopathy: Secondary | ICD-10-CM | POA: Diagnosis not present

## 2023-12-18 DIAGNOSIS — E039 Hypothyroidism, unspecified: Secondary | ICD-10-CM | POA: Diagnosis not present

## 2023-12-18 DIAGNOSIS — I5022 Chronic systolic (congestive) heart failure: Secondary | ICD-10-CM | POA: Diagnosis not present

## 2023-12-18 DIAGNOSIS — I255 Ischemic cardiomyopathy: Secondary | ICD-10-CM | POA: Diagnosis not present

## 2023-12-18 DIAGNOSIS — I4891 Unspecified atrial fibrillation: Secondary | ICD-10-CM | POA: Diagnosis not present

## 2023-12-18 DIAGNOSIS — I472 Ventricular tachycardia, unspecified: Secondary | ICD-10-CM | POA: Diagnosis not present

## 2023-12-18 DIAGNOSIS — I2510 Atherosclerotic heart disease of native coronary artery without angina pectoris: Secondary | ICD-10-CM | POA: Diagnosis not present

## 2023-12-21 DIAGNOSIS — I255 Ischemic cardiomyopathy: Secondary | ICD-10-CM | POA: Diagnosis not present

## 2023-12-21 DIAGNOSIS — I4891 Unspecified atrial fibrillation: Secondary | ICD-10-CM | POA: Diagnosis not present

## 2023-12-21 DIAGNOSIS — E039 Hypothyroidism, unspecified: Secondary | ICD-10-CM | POA: Diagnosis not present

## 2023-12-21 DIAGNOSIS — I5022 Chronic systolic (congestive) heart failure: Secondary | ICD-10-CM | POA: Diagnosis not present

## 2023-12-21 DIAGNOSIS — I472 Ventricular tachycardia, unspecified: Secondary | ICD-10-CM | POA: Diagnosis not present

## 2023-12-21 DIAGNOSIS — I2510 Atherosclerotic heart disease of native coronary artery without angina pectoris: Secondary | ICD-10-CM | POA: Diagnosis not present

## 2023-12-22 DIAGNOSIS — I4891 Unspecified atrial fibrillation: Secondary | ICD-10-CM | POA: Diagnosis not present

## 2023-12-22 DIAGNOSIS — I255 Ischemic cardiomyopathy: Secondary | ICD-10-CM | POA: Diagnosis not present

## 2023-12-22 DIAGNOSIS — E039 Hypothyroidism, unspecified: Secondary | ICD-10-CM | POA: Diagnosis not present

## 2023-12-22 DIAGNOSIS — I2510 Atherosclerotic heart disease of native coronary artery without angina pectoris: Secondary | ICD-10-CM | POA: Diagnosis not present

## 2023-12-22 DIAGNOSIS — I472 Ventricular tachycardia, unspecified: Secondary | ICD-10-CM | POA: Diagnosis not present

## 2023-12-22 DIAGNOSIS — I5022 Chronic systolic (congestive) heart failure: Secondary | ICD-10-CM | POA: Diagnosis not present

## 2023-12-25 DIAGNOSIS — E039 Hypothyroidism, unspecified: Secondary | ICD-10-CM | POA: Diagnosis not present

## 2023-12-25 DIAGNOSIS — I5022 Chronic systolic (congestive) heart failure: Secondary | ICD-10-CM | POA: Diagnosis not present

## 2023-12-25 DIAGNOSIS — I472 Ventricular tachycardia, unspecified: Secondary | ICD-10-CM | POA: Diagnosis not present

## 2023-12-25 DIAGNOSIS — I255 Ischemic cardiomyopathy: Secondary | ICD-10-CM | POA: Diagnosis not present

## 2023-12-25 DIAGNOSIS — I4891 Unspecified atrial fibrillation: Secondary | ICD-10-CM | POA: Diagnosis not present

## 2023-12-25 DIAGNOSIS — I2510 Atherosclerotic heart disease of native coronary artery without angina pectoris: Secondary | ICD-10-CM | POA: Diagnosis not present

## 2023-12-30 DIAGNOSIS — I2510 Atherosclerotic heart disease of native coronary artery without angina pectoris: Secondary | ICD-10-CM | POA: Diagnosis not present

## 2023-12-30 DIAGNOSIS — E039 Hypothyroidism, unspecified: Secondary | ICD-10-CM | POA: Diagnosis not present

## 2023-12-30 DIAGNOSIS — I255 Ischemic cardiomyopathy: Secondary | ICD-10-CM | POA: Diagnosis not present

## 2023-12-30 DIAGNOSIS — F0390 Unspecified dementia without behavioral disturbance: Secondary | ICD-10-CM | POA: Diagnosis not present

## 2023-12-30 DIAGNOSIS — E785 Hyperlipidemia, unspecified: Secondary | ICD-10-CM | POA: Diagnosis not present

## 2023-12-30 DIAGNOSIS — I472 Ventricular tachycardia, unspecified: Secondary | ICD-10-CM | POA: Diagnosis not present

## 2023-12-30 DIAGNOSIS — I4891 Unspecified atrial fibrillation: Secondary | ICD-10-CM | POA: Diagnosis not present

## 2023-12-30 DIAGNOSIS — I5022 Chronic systolic (congestive) heart failure: Secondary | ICD-10-CM | POA: Diagnosis not present

## 2023-12-30 DIAGNOSIS — K4090 Unilateral inguinal hernia, without obstruction or gangrene, not specified as recurrent: Secondary | ICD-10-CM | POA: Diagnosis not present

## 2023-12-31 DIAGNOSIS — I472 Ventricular tachycardia, unspecified: Secondary | ICD-10-CM | POA: Diagnosis not present

## 2023-12-31 DIAGNOSIS — I255 Ischemic cardiomyopathy: Secondary | ICD-10-CM | POA: Diagnosis not present

## 2023-12-31 DIAGNOSIS — I2510 Atherosclerotic heart disease of native coronary artery without angina pectoris: Secondary | ICD-10-CM | POA: Diagnosis not present

## 2023-12-31 DIAGNOSIS — I5022 Chronic systolic (congestive) heart failure: Secondary | ICD-10-CM | POA: Diagnosis not present

## 2023-12-31 DIAGNOSIS — I4891 Unspecified atrial fibrillation: Secondary | ICD-10-CM | POA: Diagnosis not present

## 2023-12-31 DIAGNOSIS — E039 Hypothyroidism, unspecified: Secondary | ICD-10-CM | POA: Diagnosis not present

## 2024-01-01 DIAGNOSIS — I5022 Chronic systolic (congestive) heart failure: Secondary | ICD-10-CM | POA: Diagnosis not present

## 2024-01-01 DIAGNOSIS — E039 Hypothyroidism, unspecified: Secondary | ICD-10-CM | POA: Diagnosis not present

## 2024-01-01 DIAGNOSIS — I472 Ventricular tachycardia, unspecified: Secondary | ICD-10-CM | POA: Diagnosis not present

## 2024-01-01 DIAGNOSIS — I2510 Atherosclerotic heart disease of native coronary artery without angina pectoris: Secondary | ICD-10-CM | POA: Diagnosis not present

## 2024-01-01 DIAGNOSIS — I4891 Unspecified atrial fibrillation: Secondary | ICD-10-CM | POA: Diagnosis not present

## 2024-01-01 DIAGNOSIS — I255 Ischemic cardiomyopathy: Secondary | ICD-10-CM | POA: Diagnosis not present

## 2024-01-04 DIAGNOSIS — I2510 Atherosclerotic heart disease of native coronary artery without angina pectoris: Secondary | ICD-10-CM | POA: Diagnosis not present

## 2024-01-04 DIAGNOSIS — I255 Ischemic cardiomyopathy: Secondary | ICD-10-CM | POA: Diagnosis not present

## 2024-01-04 DIAGNOSIS — I5022 Chronic systolic (congestive) heart failure: Secondary | ICD-10-CM | POA: Diagnosis not present

## 2024-01-04 DIAGNOSIS — I4891 Unspecified atrial fibrillation: Secondary | ICD-10-CM | POA: Diagnosis not present

## 2024-01-04 DIAGNOSIS — I472 Ventricular tachycardia, unspecified: Secondary | ICD-10-CM | POA: Diagnosis not present

## 2024-01-04 DIAGNOSIS — E039 Hypothyroidism, unspecified: Secondary | ICD-10-CM | POA: Diagnosis not present

## 2024-01-06 DIAGNOSIS — I2510 Atherosclerotic heart disease of native coronary artery without angina pectoris: Secondary | ICD-10-CM | POA: Diagnosis not present

## 2024-01-06 DIAGNOSIS — I255 Ischemic cardiomyopathy: Secondary | ICD-10-CM | POA: Diagnosis not present

## 2024-01-06 DIAGNOSIS — I5022 Chronic systolic (congestive) heart failure: Secondary | ICD-10-CM | POA: Diagnosis not present

## 2024-01-06 DIAGNOSIS — I472 Ventricular tachycardia, unspecified: Secondary | ICD-10-CM | POA: Diagnosis not present

## 2024-01-06 DIAGNOSIS — I4891 Unspecified atrial fibrillation: Secondary | ICD-10-CM | POA: Diagnosis not present

## 2024-01-06 DIAGNOSIS — E039 Hypothyroidism, unspecified: Secondary | ICD-10-CM | POA: Diagnosis not present

## 2024-01-08 DIAGNOSIS — I472 Ventricular tachycardia, unspecified: Secondary | ICD-10-CM | POA: Diagnosis not present

## 2024-01-08 DIAGNOSIS — I2510 Atherosclerotic heart disease of native coronary artery without angina pectoris: Secondary | ICD-10-CM | POA: Diagnosis not present

## 2024-01-08 DIAGNOSIS — E039 Hypothyroidism, unspecified: Secondary | ICD-10-CM | POA: Diagnosis not present

## 2024-01-08 DIAGNOSIS — I255 Ischemic cardiomyopathy: Secondary | ICD-10-CM | POA: Diagnosis not present

## 2024-01-08 DIAGNOSIS — I4891 Unspecified atrial fibrillation: Secondary | ICD-10-CM | POA: Diagnosis not present

## 2024-01-08 DIAGNOSIS — I5022 Chronic systolic (congestive) heart failure: Secondary | ICD-10-CM | POA: Diagnosis not present

## 2024-01-13 DIAGNOSIS — I5022 Chronic systolic (congestive) heart failure: Secondary | ICD-10-CM | POA: Diagnosis not present

## 2024-01-13 DIAGNOSIS — I472 Ventricular tachycardia, unspecified: Secondary | ICD-10-CM | POA: Diagnosis not present

## 2024-01-13 DIAGNOSIS — I4891 Unspecified atrial fibrillation: Secondary | ICD-10-CM | POA: Diagnosis not present

## 2024-01-13 DIAGNOSIS — I255 Ischemic cardiomyopathy: Secondary | ICD-10-CM | POA: Diagnosis not present

## 2024-01-13 DIAGNOSIS — I2510 Atherosclerotic heart disease of native coronary artery without angina pectoris: Secondary | ICD-10-CM | POA: Diagnosis not present

## 2024-01-13 DIAGNOSIS — E039 Hypothyroidism, unspecified: Secondary | ICD-10-CM | POA: Diagnosis not present

## 2024-01-15 DIAGNOSIS — I255 Ischemic cardiomyopathy: Secondary | ICD-10-CM | POA: Diagnosis not present

## 2024-01-15 DIAGNOSIS — I2510 Atherosclerotic heart disease of native coronary artery without angina pectoris: Secondary | ICD-10-CM | POA: Diagnosis not present

## 2024-01-15 DIAGNOSIS — I472 Ventricular tachycardia, unspecified: Secondary | ICD-10-CM | POA: Diagnosis not present

## 2024-01-15 DIAGNOSIS — E039 Hypothyroidism, unspecified: Secondary | ICD-10-CM | POA: Diagnosis not present

## 2024-01-15 DIAGNOSIS — I5022 Chronic systolic (congestive) heart failure: Secondary | ICD-10-CM | POA: Diagnosis not present

## 2024-01-15 DIAGNOSIS — I4891 Unspecified atrial fibrillation: Secondary | ICD-10-CM | POA: Diagnosis not present

## 2024-01-19 DIAGNOSIS — I472 Ventricular tachycardia, unspecified: Secondary | ICD-10-CM | POA: Diagnosis not present

## 2024-01-19 DIAGNOSIS — I255 Ischemic cardiomyopathy: Secondary | ICD-10-CM | POA: Diagnosis not present

## 2024-01-19 DIAGNOSIS — I4891 Unspecified atrial fibrillation: Secondary | ICD-10-CM | POA: Diagnosis not present

## 2024-01-19 DIAGNOSIS — E039 Hypothyroidism, unspecified: Secondary | ICD-10-CM | POA: Diagnosis not present

## 2024-01-19 DIAGNOSIS — I2510 Atherosclerotic heart disease of native coronary artery without angina pectoris: Secondary | ICD-10-CM | POA: Diagnosis not present

## 2024-01-19 DIAGNOSIS — I5022 Chronic systolic (congestive) heart failure: Secondary | ICD-10-CM | POA: Diagnosis not present

## 2024-01-20 DIAGNOSIS — I4891 Unspecified atrial fibrillation: Secondary | ICD-10-CM | POA: Diagnosis not present

## 2024-01-20 DIAGNOSIS — I5022 Chronic systolic (congestive) heart failure: Secondary | ICD-10-CM | POA: Diagnosis not present

## 2024-01-20 DIAGNOSIS — I2510 Atherosclerotic heart disease of native coronary artery without angina pectoris: Secondary | ICD-10-CM | POA: Diagnosis not present

## 2024-01-20 DIAGNOSIS — E039 Hypothyroidism, unspecified: Secondary | ICD-10-CM | POA: Diagnosis not present

## 2024-01-20 DIAGNOSIS — I255 Ischemic cardiomyopathy: Secondary | ICD-10-CM | POA: Diagnosis not present

## 2024-01-20 DIAGNOSIS — I472 Ventricular tachycardia, unspecified: Secondary | ICD-10-CM | POA: Diagnosis not present

## 2024-01-22 DIAGNOSIS — I5022 Chronic systolic (congestive) heart failure: Secondary | ICD-10-CM | POA: Diagnosis not present

## 2024-01-22 DIAGNOSIS — L600 Ingrowing nail: Secondary | ICD-10-CM | POA: Diagnosis not present

## 2024-01-22 DIAGNOSIS — I4891 Unspecified atrial fibrillation: Secondary | ICD-10-CM | POA: Diagnosis not present

## 2024-01-22 DIAGNOSIS — E1142 Type 2 diabetes mellitus with diabetic polyneuropathy: Secondary | ICD-10-CM | POA: Diagnosis not present

## 2024-01-22 DIAGNOSIS — I255 Ischemic cardiomyopathy: Secondary | ICD-10-CM | POA: Diagnosis not present

## 2024-01-22 DIAGNOSIS — I472 Ventricular tachycardia, unspecified: Secondary | ICD-10-CM | POA: Diagnosis not present

## 2024-01-22 DIAGNOSIS — E039 Hypothyroidism, unspecified: Secondary | ICD-10-CM | POA: Diagnosis not present

## 2024-01-22 DIAGNOSIS — I2510 Atherosclerotic heart disease of native coronary artery without angina pectoris: Secondary | ICD-10-CM | POA: Diagnosis not present

## 2024-01-27 DIAGNOSIS — I5022 Chronic systolic (congestive) heart failure: Secondary | ICD-10-CM | POA: Diagnosis not present

## 2024-01-27 DIAGNOSIS — I2510 Atherosclerotic heart disease of native coronary artery without angina pectoris: Secondary | ICD-10-CM | POA: Diagnosis not present

## 2024-01-27 DIAGNOSIS — I255 Ischemic cardiomyopathy: Secondary | ICD-10-CM | POA: Diagnosis not present

## 2024-01-27 DIAGNOSIS — I4891 Unspecified atrial fibrillation: Secondary | ICD-10-CM | POA: Diagnosis not present

## 2024-01-27 DIAGNOSIS — E039 Hypothyroidism, unspecified: Secondary | ICD-10-CM | POA: Diagnosis not present

## 2024-01-27 DIAGNOSIS — I472 Ventricular tachycardia, unspecified: Secondary | ICD-10-CM | POA: Diagnosis not present

## 2024-01-29 DIAGNOSIS — I5022 Chronic systolic (congestive) heart failure: Secondary | ICD-10-CM | POA: Diagnosis not present

## 2024-01-29 DIAGNOSIS — I255 Ischemic cardiomyopathy: Secondary | ICD-10-CM | POA: Diagnosis not present

## 2024-01-29 DIAGNOSIS — I2510 Atherosclerotic heart disease of native coronary artery without angina pectoris: Secondary | ICD-10-CM | POA: Diagnosis not present

## 2024-01-29 DIAGNOSIS — E039 Hypothyroidism, unspecified: Secondary | ICD-10-CM | POA: Diagnosis not present

## 2024-01-29 DIAGNOSIS — I4891 Unspecified atrial fibrillation: Secondary | ICD-10-CM | POA: Diagnosis not present

## 2024-01-29 DIAGNOSIS — I472 Ventricular tachycardia, unspecified: Secondary | ICD-10-CM | POA: Diagnosis not present

## 2024-01-30 DIAGNOSIS — E785 Hyperlipidemia, unspecified: Secondary | ICD-10-CM | POA: Diagnosis not present

## 2024-01-30 DIAGNOSIS — I255 Ischemic cardiomyopathy: Secondary | ICD-10-CM | POA: Diagnosis not present

## 2024-01-30 DIAGNOSIS — I5022 Chronic systolic (congestive) heart failure: Secondary | ICD-10-CM | POA: Diagnosis not present

## 2024-01-30 DIAGNOSIS — I4891 Unspecified atrial fibrillation: Secondary | ICD-10-CM | POA: Diagnosis not present

## 2024-01-30 DIAGNOSIS — K4090 Unilateral inguinal hernia, without obstruction or gangrene, not specified as recurrent: Secondary | ICD-10-CM | POA: Diagnosis not present

## 2024-01-30 DIAGNOSIS — F0390 Unspecified dementia without behavioral disturbance: Secondary | ICD-10-CM | POA: Diagnosis not present

## 2024-01-30 DIAGNOSIS — E039 Hypothyroidism, unspecified: Secondary | ICD-10-CM | POA: Diagnosis not present

## 2024-01-30 DIAGNOSIS — I2510 Atherosclerotic heart disease of native coronary artery without angina pectoris: Secondary | ICD-10-CM | POA: Diagnosis not present

## 2024-01-30 DIAGNOSIS — I472 Ventricular tachycardia, unspecified: Secondary | ICD-10-CM | POA: Diagnosis not present

## 2024-02-03 DIAGNOSIS — I255 Ischemic cardiomyopathy: Secondary | ICD-10-CM | POA: Diagnosis not present

## 2024-02-03 DIAGNOSIS — E039 Hypothyroidism, unspecified: Secondary | ICD-10-CM | POA: Diagnosis not present

## 2024-02-03 DIAGNOSIS — I5022 Chronic systolic (congestive) heart failure: Secondary | ICD-10-CM | POA: Diagnosis not present

## 2024-02-03 DIAGNOSIS — I2510 Atherosclerotic heart disease of native coronary artery without angina pectoris: Secondary | ICD-10-CM | POA: Diagnosis not present

## 2024-02-03 DIAGNOSIS — I4891 Unspecified atrial fibrillation: Secondary | ICD-10-CM | POA: Diagnosis not present

## 2024-02-03 DIAGNOSIS — I472 Ventricular tachycardia, unspecified: Secondary | ICD-10-CM | POA: Diagnosis not present

## 2024-02-04 DIAGNOSIS — L600 Ingrowing nail: Secondary | ICD-10-CM | POA: Diagnosis not present

## 2024-02-05 DIAGNOSIS — I2510 Atherosclerotic heart disease of native coronary artery without angina pectoris: Secondary | ICD-10-CM | POA: Diagnosis not present

## 2024-02-05 DIAGNOSIS — I4891 Unspecified atrial fibrillation: Secondary | ICD-10-CM | POA: Diagnosis not present

## 2024-02-05 DIAGNOSIS — I472 Ventricular tachycardia, unspecified: Secondary | ICD-10-CM | POA: Diagnosis not present

## 2024-02-05 DIAGNOSIS — I255 Ischemic cardiomyopathy: Secondary | ICD-10-CM | POA: Diagnosis not present

## 2024-02-05 DIAGNOSIS — E039 Hypothyroidism, unspecified: Secondary | ICD-10-CM | POA: Diagnosis not present

## 2024-02-05 DIAGNOSIS — I5022 Chronic systolic (congestive) heart failure: Secondary | ICD-10-CM | POA: Diagnosis not present

## 2024-02-08 DIAGNOSIS — I255 Ischemic cardiomyopathy: Secondary | ICD-10-CM | POA: Diagnosis not present

## 2024-02-08 DIAGNOSIS — I2510 Atherosclerotic heart disease of native coronary artery without angina pectoris: Secondary | ICD-10-CM | POA: Diagnosis not present

## 2024-02-08 DIAGNOSIS — I5022 Chronic systolic (congestive) heart failure: Secondary | ICD-10-CM | POA: Diagnosis not present

## 2024-02-08 DIAGNOSIS — I472 Ventricular tachycardia, unspecified: Secondary | ICD-10-CM | POA: Diagnosis not present

## 2024-02-08 DIAGNOSIS — E039 Hypothyroidism, unspecified: Secondary | ICD-10-CM | POA: Diagnosis not present

## 2024-02-08 DIAGNOSIS — I4891 Unspecified atrial fibrillation: Secondary | ICD-10-CM | POA: Diagnosis not present

## 2024-02-10 DIAGNOSIS — I2510 Atherosclerotic heart disease of native coronary artery without angina pectoris: Secondary | ICD-10-CM | POA: Diagnosis not present

## 2024-02-10 DIAGNOSIS — E039 Hypothyroidism, unspecified: Secondary | ICD-10-CM | POA: Diagnosis not present

## 2024-02-10 DIAGNOSIS — I255 Ischemic cardiomyopathy: Secondary | ICD-10-CM | POA: Diagnosis not present

## 2024-02-10 DIAGNOSIS — I5022 Chronic systolic (congestive) heart failure: Secondary | ICD-10-CM | POA: Diagnosis not present

## 2024-02-10 DIAGNOSIS — I4891 Unspecified atrial fibrillation: Secondary | ICD-10-CM | POA: Diagnosis not present

## 2024-02-10 DIAGNOSIS — I472 Ventricular tachycardia, unspecified: Secondary | ICD-10-CM | POA: Diagnosis not present

## 2024-02-12 DIAGNOSIS — I5022 Chronic systolic (congestive) heart failure: Secondary | ICD-10-CM | POA: Diagnosis not present

## 2024-02-12 DIAGNOSIS — I2510 Atherosclerotic heart disease of native coronary artery without angina pectoris: Secondary | ICD-10-CM | POA: Diagnosis not present

## 2024-02-12 DIAGNOSIS — I255 Ischemic cardiomyopathy: Secondary | ICD-10-CM | POA: Diagnosis not present

## 2024-02-12 DIAGNOSIS — I4891 Unspecified atrial fibrillation: Secondary | ICD-10-CM | POA: Diagnosis not present

## 2024-02-12 DIAGNOSIS — I472 Ventricular tachycardia, unspecified: Secondary | ICD-10-CM | POA: Diagnosis not present

## 2024-02-12 DIAGNOSIS — E039 Hypothyroidism, unspecified: Secondary | ICD-10-CM | POA: Diagnosis not present

## 2024-02-16 DIAGNOSIS — I472 Ventricular tachycardia, unspecified: Secondary | ICD-10-CM | POA: Diagnosis not present

## 2024-02-16 DIAGNOSIS — I4891 Unspecified atrial fibrillation: Secondary | ICD-10-CM | POA: Diagnosis not present

## 2024-02-16 DIAGNOSIS — I255 Ischemic cardiomyopathy: Secondary | ICD-10-CM | POA: Diagnosis not present

## 2024-02-16 DIAGNOSIS — I5022 Chronic systolic (congestive) heart failure: Secondary | ICD-10-CM | POA: Diagnosis not present

## 2024-02-16 DIAGNOSIS — I2510 Atherosclerotic heart disease of native coronary artery without angina pectoris: Secondary | ICD-10-CM | POA: Diagnosis not present

## 2024-02-16 DIAGNOSIS — E039 Hypothyroidism, unspecified: Secondary | ICD-10-CM | POA: Diagnosis not present

## 2024-02-17 DIAGNOSIS — I5022 Chronic systolic (congestive) heart failure: Secondary | ICD-10-CM | POA: Diagnosis not present

## 2024-02-17 DIAGNOSIS — I4891 Unspecified atrial fibrillation: Secondary | ICD-10-CM | POA: Diagnosis not present

## 2024-02-17 DIAGNOSIS — E039 Hypothyroidism, unspecified: Secondary | ICD-10-CM | POA: Diagnosis not present

## 2024-02-17 DIAGNOSIS — I472 Ventricular tachycardia, unspecified: Secondary | ICD-10-CM | POA: Diagnosis not present

## 2024-02-17 DIAGNOSIS — I255 Ischemic cardiomyopathy: Secondary | ICD-10-CM | POA: Diagnosis not present

## 2024-02-17 DIAGNOSIS — I2510 Atherosclerotic heart disease of native coronary artery without angina pectoris: Secondary | ICD-10-CM | POA: Diagnosis not present

## 2024-02-19 DIAGNOSIS — I2510 Atherosclerotic heart disease of native coronary artery without angina pectoris: Secondary | ICD-10-CM | POA: Diagnosis not present

## 2024-02-19 DIAGNOSIS — E039 Hypothyroidism, unspecified: Secondary | ICD-10-CM | POA: Diagnosis not present

## 2024-02-19 DIAGNOSIS — I5022 Chronic systolic (congestive) heart failure: Secondary | ICD-10-CM | POA: Diagnosis not present

## 2024-02-19 DIAGNOSIS — I255 Ischemic cardiomyopathy: Secondary | ICD-10-CM | POA: Diagnosis not present

## 2024-02-19 DIAGNOSIS — I4891 Unspecified atrial fibrillation: Secondary | ICD-10-CM | POA: Diagnosis not present

## 2024-02-19 DIAGNOSIS — I472 Ventricular tachycardia, unspecified: Secondary | ICD-10-CM | POA: Diagnosis not present

## 2024-02-24 DIAGNOSIS — I4891 Unspecified atrial fibrillation: Secondary | ICD-10-CM | POA: Diagnosis not present

## 2024-02-24 DIAGNOSIS — E039 Hypothyroidism, unspecified: Secondary | ICD-10-CM | POA: Diagnosis not present

## 2024-02-24 DIAGNOSIS — I255 Ischemic cardiomyopathy: Secondary | ICD-10-CM | POA: Diagnosis not present

## 2024-02-24 DIAGNOSIS — I472 Ventricular tachycardia, unspecified: Secondary | ICD-10-CM | POA: Diagnosis not present

## 2024-02-24 DIAGNOSIS — I2510 Atherosclerotic heart disease of native coronary artery without angina pectoris: Secondary | ICD-10-CM | POA: Diagnosis not present

## 2024-02-24 DIAGNOSIS — I5022 Chronic systolic (congestive) heart failure: Secondary | ICD-10-CM | POA: Diagnosis not present

## 2024-02-25 DIAGNOSIS — E039 Hypothyroidism, unspecified: Secondary | ICD-10-CM | POA: Diagnosis not present

## 2024-02-25 DIAGNOSIS — I472 Ventricular tachycardia, unspecified: Secondary | ICD-10-CM | POA: Diagnosis not present

## 2024-02-25 DIAGNOSIS — I5022 Chronic systolic (congestive) heart failure: Secondary | ICD-10-CM | POA: Diagnosis not present

## 2024-02-25 DIAGNOSIS — I255 Ischemic cardiomyopathy: Secondary | ICD-10-CM | POA: Diagnosis not present

## 2024-02-25 DIAGNOSIS — I4891 Unspecified atrial fibrillation: Secondary | ICD-10-CM | POA: Diagnosis not present

## 2024-02-25 DIAGNOSIS — I2510 Atherosclerotic heart disease of native coronary artery without angina pectoris: Secondary | ICD-10-CM | POA: Diagnosis not present

## 2024-02-26 DIAGNOSIS — I4891 Unspecified atrial fibrillation: Secondary | ICD-10-CM | POA: Diagnosis not present

## 2024-02-26 DIAGNOSIS — I5022 Chronic systolic (congestive) heart failure: Secondary | ICD-10-CM | POA: Diagnosis not present

## 2024-02-26 DIAGNOSIS — I2510 Atherosclerotic heart disease of native coronary artery without angina pectoris: Secondary | ICD-10-CM | POA: Diagnosis not present

## 2024-02-26 DIAGNOSIS — I255 Ischemic cardiomyopathy: Secondary | ICD-10-CM | POA: Diagnosis not present

## 2024-02-26 DIAGNOSIS — E039 Hypothyroidism, unspecified: Secondary | ICD-10-CM | POA: Diagnosis not present

## 2024-02-26 DIAGNOSIS — I472 Ventricular tachycardia, unspecified: Secondary | ICD-10-CM | POA: Diagnosis not present

## 2024-02-27 DIAGNOSIS — I4891 Unspecified atrial fibrillation: Secondary | ICD-10-CM | POA: Diagnosis not present

## 2024-02-27 DIAGNOSIS — K4090 Unilateral inguinal hernia, without obstruction or gangrene, not specified as recurrent: Secondary | ICD-10-CM | POA: Diagnosis not present

## 2024-02-27 DIAGNOSIS — I2510 Atherosclerotic heart disease of native coronary artery without angina pectoris: Secondary | ICD-10-CM | POA: Diagnosis not present

## 2024-02-27 DIAGNOSIS — E039 Hypothyroidism, unspecified: Secondary | ICD-10-CM | POA: Diagnosis not present

## 2024-02-27 DIAGNOSIS — F0390 Unspecified dementia without behavioral disturbance: Secondary | ICD-10-CM | POA: Diagnosis not present

## 2024-02-27 DIAGNOSIS — I5022 Chronic systolic (congestive) heart failure: Secondary | ICD-10-CM | POA: Diagnosis not present

## 2024-02-27 DIAGNOSIS — E785 Hyperlipidemia, unspecified: Secondary | ICD-10-CM | POA: Diagnosis not present

## 2024-02-27 DIAGNOSIS — I255 Ischemic cardiomyopathy: Secondary | ICD-10-CM | POA: Diagnosis not present

## 2024-02-27 DIAGNOSIS — I472 Ventricular tachycardia, unspecified: Secondary | ICD-10-CM | POA: Diagnosis not present

## 2024-03-03 DIAGNOSIS — I4891 Unspecified atrial fibrillation: Secondary | ICD-10-CM | POA: Diagnosis not present

## 2024-03-03 DIAGNOSIS — I2510 Atherosclerotic heart disease of native coronary artery without angina pectoris: Secondary | ICD-10-CM | POA: Diagnosis not present

## 2024-03-03 DIAGNOSIS — I5022 Chronic systolic (congestive) heart failure: Secondary | ICD-10-CM | POA: Diagnosis not present

## 2024-03-03 DIAGNOSIS — E039 Hypothyroidism, unspecified: Secondary | ICD-10-CM | POA: Diagnosis not present

## 2024-03-03 DIAGNOSIS — I472 Ventricular tachycardia, unspecified: Secondary | ICD-10-CM | POA: Diagnosis not present

## 2024-03-03 DIAGNOSIS — I255 Ischemic cardiomyopathy: Secondary | ICD-10-CM | POA: Diagnosis not present

## 2024-03-04 DIAGNOSIS — I255 Ischemic cardiomyopathy: Secondary | ICD-10-CM | POA: Diagnosis not present

## 2024-03-04 DIAGNOSIS — I4891 Unspecified atrial fibrillation: Secondary | ICD-10-CM | POA: Diagnosis not present

## 2024-03-04 DIAGNOSIS — E039 Hypothyroidism, unspecified: Secondary | ICD-10-CM | POA: Diagnosis not present

## 2024-03-04 DIAGNOSIS — I2510 Atherosclerotic heart disease of native coronary artery without angina pectoris: Secondary | ICD-10-CM | POA: Diagnosis not present

## 2024-03-04 DIAGNOSIS — I472 Ventricular tachycardia, unspecified: Secondary | ICD-10-CM | POA: Diagnosis not present

## 2024-03-04 DIAGNOSIS — I5022 Chronic systolic (congestive) heart failure: Secondary | ICD-10-CM | POA: Diagnosis not present

## 2024-03-05 DIAGNOSIS — I5022 Chronic systolic (congestive) heart failure: Secondary | ICD-10-CM | POA: Diagnosis not present

## 2024-03-05 DIAGNOSIS — I2510 Atherosclerotic heart disease of native coronary artery without angina pectoris: Secondary | ICD-10-CM | POA: Diagnosis not present

## 2024-03-05 DIAGNOSIS — I472 Ventricular tachycardia, unspecified: Secondary | ICD-10-CM | POA: Diagnosis not present

## 2024-03-05 DIAGNOSIS — E039 Hypothyroidism, unspecified: Secondary | ICD-10-CM | POA: Diagnosis not present

## 2024-03-05 DIAGNOSIS — I255 Ischemic cardiomyopathy: Secondary | ICD-10-CM | POA: Diagnosis not present

## 2024-03-05 DIAGNOSIS — I4891 Unspecified atrial fibrillation: Secondary | ICD-10-CM | POA: Diagnosis not present

## 2024-03-06 DIAGNOSIS — I5022 Chronic systolic (congestive) heart failure: Secondary | ICD-10-CM | POA: Diagnosis not present

## 2024-03-06 DIAGNOSIS — I2510 Atherosclerotic heart disease of native coronary artery without angina pectoris: Secondary | ICD-10-CM | POA: Diagnosis not present

## 2024-03-06 DIAGNOSIS — I4891 Unspecified atrial fibrillation: Secondary | ICD-10-CM | POA: Diagnosis not present

## 2024-03-06 DIAGNOSIS — E039 Hypothyroidism, unspecified: Secondary | ICD-10-CM | POA: Diagnosis not present

## 2024-03-06 DIAGNOSIS — I472 Ventricular tachycardia, unspecified: Secondary | ICD-10-CM | POA: Diagnosis not present

## 2024-03-06 DIAGNOSIS — I255 Ischemic cardiomyopathy: Secondary | ICD-10-CM | POA: Diagnosis not present

## 2024-03-08 DIAGNOSIS — I472 Ventricular tachycardia, unspecified: Secondary | ICD-10-CM | POA: Diagnosis not present

## 2024-03-08 DIAGNOSIS — I5022 Chronic systolic (congestive) heart failure: Secondary | ICD-10-CM | POA: Diagnosis not present

## 2024-03-08 DIAGNOSIS — I4891 Unspecified atrial fibrillation: Secondary | ICD-10-CM | POA: Diagnosis not present

## 2024-03-08 DIAGNOSIS — I255 Ischemic cardiomyopathy: Secondary | ICD-10-CM | POA: Diagnosis not present

## 2024-03-08 DIAGNOSIS — E039 Hypothyroidism, unspecified: Secondary | ICD-10-CM | POA: Diagnosis not present

## 2024-03-08 DIAGNOSIS — I2510 Atherosclerotic heart disease of native coronary artery without angina pectoris: Secondary | ICD-10-CM | POA: Diagnosis not present

## 2024-03-09 DIAGNOSIS — I5022 Chronic systolic (congestive) heart failure: Secondary | ICD-10-CM | POA: Diagnosis not present

## 2024-03-09 DIAGNOSIS — I255 Ischemic cardiomyopathy: Secondary | ICD-10-CM | POA: Diagnosis not present

## 2024-03-09 DIAGNOSIS — E039 Hypothyroidism, unspecified: Secondary | ICD-10-CM | POA: Diagnosis not present

## 2024-03-09 DIAGNOSIS — I472 Ventricular tachycardia, unspecified: Secondary | ICD-10-CM | POA: Diagnosis not present

## 2024-03-09 DIAGNOSIS — I2510 Atherosclerotic heart disease of native coronary artery without angina pectoris: Secondary | ICD-10-CM | POA: Diagnosis not present

## 2024-03-09 DIAGNOSIS — I4891 Unspecified atrial fibrillation: Secondary | ICD-10-CM | POA: Diagnosis not present

## 2024-03-10 DIAGNOSIS — I5022 Chronic systolic (congestive) heart failure: Secondary | ICD-10-CM | POA: Diagnosis not present

## 2024-03-10 DIAGNOSIS — I255 Ischemic cardiomyopathy: Secondary | ICD-10-CM | POA: Diagnosis not present

## 2024-03-10 DIAGNOSIS — I472 Ventricular tachycardia, unspecified: Secondary | ICD-10-CM | POA: Diagnosis not present

## 2024-03-10 DIAGNOSIS — I2510 Atherosclerotic heart disease of native coronary artery without angina pectoris: Secondary | ICD-10-CM | POA: Diagnosis not present

## 2024-03-10 DIAGNOSIS — E039 Hypothyroidism, unspecified: Secondary | ICD-10-CM | POA: Diagnosis not present

## 2024-03-10 DIAGNOSIS — I4891 Unspecified atrial fibrillation: Secondary | ICD-10-CM | POA: Diagnosis not present

## 2024-03-11 DIAGNOSIS — I4891 Unspecified atrial fibrillation: Secondary | ICD-10-CM | POA: Diagnosis not present

## 2024-03-11 DIAGNOSIS — I2510 Atherosclerotic heart disease of native coronary artery without angina pectoris: Secondary | ICD-10-CM | POA: Diagnosis not present

## 2024-03-11 DIAGNOSIS — I472 Ventricular tachycardia, unspecified: Secondary | ICD-10-CM | POA: Diagnosis not present

## 2024-03-11 DIAGNOSIS — E039 Hypothyroidism, unspecified: Secondary | ICD-10-CM | POA: Diagnosis not present

## 2024-03-11 DIAGNOSIS — I255 Ischemic cardiomyopathy: Secondary | ICD-10-CM | POA: Diagnosis not present

## 2024-03-11 DIAGNOSIS — I5022 Chronic systolic (congestive) heart failure: Secondary | ICD-10-CM | POA: Diagnosis not present

## 2024-03-15 DIAGNOSIS — E1159 Type 2 diabetes mellitus with other circulatory complications: Secondary | ICD-10-CM | POA: Diagnosis not present

## 2024-03-15 DIAGNOSIS — L603 Nail dystrophy: Secondary | ICD-10-CM | POA: Diagnosis not present

## 2024-03-16 DIAGNOSIS — E039 Hypothyroidism, unspecified: Secondary | ICD-10-CM | POA: Diagnosis not present

## 2024-03-16 DIAGNOSIS — I5022 Chronic systolic (congestive) heart failure: Secondary | ICD-10-CM | POA: Diagnosis not present

## 2024-03-16 DIAGNOSIS — I255 Ischemic cardiomyopathy: Secondary | ICD-10-CM | POA: Diagnosis not present

## 2024-03-16 DIAGNOSIS — I4891 Unspecified atrial fibrillation: Secondary | ICD-10-CM | POA: Diagnosis not present

## 2024-03-16 DIAGNOSIS — I2510 Atherosclerotic heart disease of native coronary artery without angina pectoris: Secondary | ICD-10-CM | POA: Diagnosis not present

## 2024-03-16 DIAGNOSIS — I472 Ventricular tachycardia, unspecified: Secondary | ICD-10-CM | POA: Diagnosis not present

## 2024-03-17 DIAGNOSIS — I255 Ischemic cardiomyopathy: Secondary | ICD-10-CM | POA: Diagnosis not present

## 2024-03-17 DIAGNOSIS — I4891 Unspecified atrial fibrillation: Secondary | ICD-10-CM | POA: Diagnosis not present

## 2024-03-17 DIAGNOSIS — I472 Ventricular tachycardia, unspecified: Secondary | ICD-10-CM | POA: Diagnosis not present

## 2024-03-17 DIAGNOSIS — I5022 Chronic systolic (congestive) heart failure: Secondary | ICD-10-CM | POA: Diagnosis not present

## 2024-03-17 DIAGNOSIS — I2510 Atherosclerotic heart disease of native coronary artery without angina pectoris: Secondary | ICD-10-CM | POA: Diagnosis not present

## 2024-03-17 DIAGNOSIS — E039 Hypothyroidism, unspecified: Secondary | ICD-10-CM | POA: Diagnosis not present

## 2024-03-18 DIAGNOSIS — I255 Ischemic cardiomyopathy: Secondary | ICD-10-CM | POA: Diagnosis not present

## 2024-03-18 DIAGNOSIS — I472 Ventricular tachycardia, unspecified: Secondary | ICD-10-CM | POA: Diagnosis not present

## 2024-03-18 DIAGNOSIS — I2510 Atherosclerotic heart disease of native coronary artery without angina pectoris: Secondary | ICD-10-CM | POA: Diagnosis not present

## 2024-03-18 DIAGNOSIS — E039 Hypothyroidism, unspecified: Secondary | ICD-10-CM | POA: Diagnosis not present

## 2024-03-18 DIAGNOSIS — I5022 Chronic systolic (congestive) heart failure: Secondary | ICD-10-CM | POA: Diagnosis not present

## 2024-03-18 DIAGNOSIS — I4891 Unspecified atrial fibrillation: Secondary | ICD-10-CM | POA: Diagnosis not present

## 2024-03-23 DIAGNOSIS — E039 Hypothyroidism, unspecified: Secondary | ICD-10-CM | POA: Diagnosis not present

## 2024-03-23 DIAGNOSIS — I2510 Atherosclerotic heart disease of native coronary artery without angina pectoris: Secondary | ICD-10-CM | POA: Diagnosis not present

## 2024-03-23 DIAGNOSIS — I5022 Chronic systolic (congestive) heart failure: Secondary | ICD-10-CM | POA: Diagnosis not present

## 2024-03-23 DIAGNOSIS — I472 Ventricular tachycardia, unspecified: Secondary | ICD-10-CM | POA: Diagnosis not present

## 2024-03-23 DIAGNOSIS — I255 Ischemic cardiomyopathy: Secondary | ICD-10-CM | POA: Diagnosis not present

## 2024-03-23 DIAGNOSIS — I4891 Unspecified atrial fibrillation: Secondary | ICD-10-CM | POA: Diagnosis not present

## 2024-03-24 DIAGNOSIS — E039 Hypothyroidism, unspecified: Secondary | ICD-10-CM | POA: Diagnosis not present

## 2024-03-24 DIAGNOSIS — I255 Ischemic cardiomyopathy: Secondary | ICD-10-CM | POA: Diagnosis not present

## 2024-03-24 DIAGNOSIS — I2510 Atherosclerotic heart disease of native coronary artery without angina pectoris: Secondary | ICD-10-CM | POA: Diagnosis not present

## 2024-03-24 DIAGNOSIS — I5022 Chronic systolic (congestive) heart failure: Secondary | ICD-10-CM | POA: Diagnosis not present

## 2024-03-24 DIAGNOSIS — I472 Ventricular tachycardia, unspecified: Secondary | ICD-10-CM | POA: Diagnosis not present

## 2024-03-24 DIAGNOSIS — I4891 Unspecified atrial fibrillation: Secondary | ICD-10-CM | POA: Diagnosis not present

## 2024-03-25 DIAGNOSIS — I472 Ventricular tachycardia, unspecified: Secondary | ICD-10-CM | POA: Diagnosis not present

## 2024-03-25 DIAGNOSIS — I5022 Chronic systolic (congestive) heart failure: Secondary | ICD-10-CM | POA: Diagnosis not present

## 2024-03-25 DIAGNOSIS — I255 Ischemic cardiomyopathy: Secondary | ICD-10-CM | POA: Diagnosis not present

## 2024-03-25 DIAGNOSIS — I4891 Unspecified atrial fibrillation: Secondary | ICD-10-CM | POA: Diagnosis not present

## 2024-03-25 DIAGNOSIS — E039 Hypothyroidism, unspecified: Secondary | ICD-10-CM | POA: Diagnosis not present

## 2024-03-25 DIAGNOSIS — I2510 Atherosclerotic heart disease of native coronary artery without angina pectoris: Secondary | ICD-10-CM | POA: Diagnosis not present

## 2024-03-29 DIAGNOSIS — I2510 Atherosclerotic heart disease of native coronary artery without angina pectoris: Secondary | ICD-10-CM | POA: Diagnosis not present

## 2024-03-29 DIAGNOSIS — E039 Hypothyroidism, unspecified: Secondary | ICD-10-CM | POA: Diagnosis not present

## 2024-03-29 DIAGNOSIS — E785 Hyperlipidemia, unspecified: Secondary | ICD-10-CM | POA: Diagnosis not present

## 2024-03-29 DIAGNOSIS — I472 Ventricular tachycardia, unspecified: Secondary | ICD-10-CM | POA: Diagnosis not present

## 2024-03-29 DIAGNOSIS — K4090 Unilateral inguinal hernia, without obstruction or gangrene, not specified as recurrent: Secondary | ICD-10-CM | POA: Diagnosis not present

## 2024-03-29 DIAGNOSIS — I5022 Chronic systolic (congestive) heart failure: Secondary | ICD-10-CM | POA: Diagnosis not present

## 2024-03-29 DIAGNOSIS — F0390 Unspecified dementia without behavioral disturbance: Secondary | ICD-10-CM | POA: Diagnosis not present

## 2024-03-29 DIAGNOSIS — I4891 Unspecified atrial fibrillation: Secondary | ICD-10-CM | POA: Diagnosis not present

## 2024-03-29 DIAGNOSIS — I255 Ischemic cardiomyopathy: Secondary | ICD-10-CM | POA: Diagnosis not present

## 2024-03-30 DIAGNOSIS — I472 Ventricular tachycardia, unspecified: Secondary | ICD-10-CM | POA: Diagnosis not present

## 2024-03-30 DIAGNOSIS — I255 Ischemic cardiomyopathy: Secondary | ICD-10-CM | POA: Diagnosis not present

## 2024-03-30 DIAGNOSIS — I4891 Unspecified atrial fibrillation: Secondary | ICD-10-CM | POA: Diagnosis not present

## 2024-03-30 DIAGNOSIS — I2510 Atherosclerotic heart disease of native coronary artery without angina pectoris: Secondary | ICD-10-CM | POA: Diagnosis not present

## 2024-03-30 DIAGNOSIS — E039 Hypothyroidism, unspecified: Secondary | ICD-10-CM | POA: Diagnosis not present

## 2024-03-30 DIAGNOSIS — I5022 Chronic systolic (congestive) heart failure: Secondary | ICD-10-CM | POA: Diagnosis not present

## 2024-04-01 DIAGNOSIS — E039 Hypothyroidism, unspecified: Secondary | ICD-10-CM | POA: Diagnosis not present

## 2024-04-01 DIAGNOSIS — I4891 Unspecified atrial fibrillation: Secondary | ICD-10-CM | POA: Diagnosis not present

## 2024-04-01 DIAGNOSIS — I2510 Atherosclerotic heart disease of native coronary artery without angina pectoris: Secondary | ICD-10-CM | POA: Diagnosis not present

## 2024-04-01 DIAGNOSIS — I255 Ischemic cardiomyopathy: Secondary | ICD-10-CM | POA: Diagnosis not present

## 2024-04-01 DIAGNOSIS — I5022 Chronic systolic (congestive) heart failure: Secondary | ICD-10-CM | POA: Diagnosis not present

## 2024-04-01 DIAGNOSIS — I472 Ventricular tachycardia, unspecified: Secondary | ICD-10-CM | POA: Diagnosis not present

## 2024-04-06 DIAGNOSIS — I5022 Chronic systolic (congestive) heart failure: Secondary | ICD-10-CM | POA: Diagnosis not present

## 2024-04-06 DIAGNOSIS — I255 Ischemic cardiomyopathy: Secondary | ICD-10-CM | POA: Diagnosis not present

## 2024-04-06 DIAGNOSIS — I2510 Atherosclerotic heart disease of native coronary artery without angina pectoris: Secondary | ICD-10-CM | POA: Diagnosis not present

## 2024-04-06 DIAGNOSIS — E039 Hypothyroidism, unspecified: Secondary | ICD-10-CM | POA: Diagnosis not present

## 2024-04-06 DIAGNOSIS — I472 Ventricular tachycardia, unspecified: Secondary | ICD-10-CM | POA: Diagnosis not present

## 2024-04-06 DIAGNOSIS — I4891 Unspecified atrial fibrillation: Secondary | ICD-10-CM | POA: Diagnosis not present

## 2024-04-08 DIAGNOSIS — E039 Hypothyroidism, unspecified: Secondary | ICD-10-CM | POA: Diagnosis not present

## 2024-04-08 DIAGNOSIS — I2510 Atherosclerotic heart disease of native coronary artery without angina pectoris: Secondary | ICD-10-CM | POA: Diagnosis not present

## 2024-04-08 DIAGNOSIS — I4891 Unspecified atrial fibrillation: Secondary | ICD-10-CM | POA: Diagnosis not present

## 2024-04-08 DIAGNOSIS — I255 Ischemic cardiomyopathy: Secondary | ICD-10-CM | POA: Diagnosis not present

## 2024-04-08 DIAGNOSIS — I5022 Chronic systolic (congestive) heart failure: Secondary | ICD-10-CM | POA: Diagnosis not present

## 2024-04-08 DIAGNOSIS — I472 Ventricular tachycardia, unspecified: Secondary | ICD-10-CM | POA: Diagnosis not present

## 2024-04-12 DIAGNOSIS — I4891 Unspecified atrial fibrillation: Secondary | ICD-10-CM | POA: Diagnosis not present

## 2024-04-12 DIAGNOSIS — I2510 Atherosclerotic heart disease of native coronary artery without angina pectoris: Secondary | ICD-10-CM | POA: Diagnosis not present

## 2024-04-12 DIAGNOSIS — I5022 Chronic systolic (congestive) heart failure: Secondary | ICD-10-CM | POA: Diagnosis not present

## 2024-04-12 DIAGNOSIS — I255 Ischemic cardiomyopathy: Secondary | ICD-10-CM | POA: Diagnosis not present

## 2024-04-12 DIAGNOSIS — I472 Ventricular tachycardia, unspecified: Secondary | ICD-10-CM | POA: Diagnosis not present

## 2024-04-12 DIAGNOSIS — E039 Hypothyroidism, unspecified: Secondary | ICD-10-CM | POA: Diagnosis not present

## 2024-04-13 DIAGNOSIS — I2510 Atherosclerotic heart disease of native coronary artery without angina pectoris: Secondary | ICD-10-CM | POA: Diagnosis not present

## 2024-04-13 DIAGNOSIS — I4891 Unspecified atrial fibrillation: Secondary | ICD-10-CM | POA: Diagnosis not present

## 2024-04-13 DIAGNOSIS — I5022 Chronic systolic (congestive) heart failure: Secondary | ICD-10-CM | POA: Diagnosis not present

## 2024-04-13 DIAGNOSIS — E039 Hypothyroidism, unspecified: Secondary | ICD-10-CM | POA: Diagnosis not present

## 2024-04-13 DIAGNOSIS — I472 Ventricular tachycardia, unspecified: Secondary | ICD-10-CM | POA: Diagnosis not present

## 2024-04-13 DIAGNOSIS — I255 Ischemic cardiomyopathy: Secondary | ICD-10-CM | POA: Diagnosis not present

## 2024-04-15 DIAGNOSIS — I255 Ischemic cardiomyopathy: Secondary | ICD-10-CM | POA: Diagnosis not present

## 2024-04-15 DIAGNOSIS — E039 Hypothyroidism, unspecified: Secondary | ICD-10-CM | POA: Diagnosis not present

## 2024-04-15 DIAGNOSIS — I5022 Chronic systolic (congestive) heart failure: Secondary | ICD-10-CM | POA: Diagnosis not present

## 2024-04-15 DIAGNOSIS — I472 Ventricular tachycardia, unspecified: Secondary | ICD-10-CM | POA: Diagnosis not present

## 2024-04-15 DIAGNOSIS — I4891 Unspecified atrial fibrillation: Secondary | ICD-10-CM | POA: Diagnosis not present

## 2024-04-15 DIAGNOSIS — I2510 Atherosclerotic heart disease of native coronary artery without angina pectoris: Secondary | ICD-10-CM | POA: Diagnosis not present

## 2024-04-20 DIAGNOSIS — I5022 Chronic systolic (congestive) heart failure: Secondary | ICD-10-CM | POA: Diagnosis not present

## 2024-04-20 DIAGNOSIS — I4891 Unspecified atrial fibrillation: Secondary | ICD-10-CM | POA: Diagnosis not present

## 2024-04-20 DIAGNOSIS — I255 Ischemic cardiomyopathy: Secondary | ICD-10-CM | POA: Diagnosis not present

## 2024-04-20 DIAGNOSIS — E039 Hypothyroidism, unspecified: Secondary | ICD-10-CM | POA: Diagnosis not present

## 2024-04-20 DIAGNOSIS — I472 Ventricular tachycardia, unspecified: Secondary | ICD-10-CM | POA: Diagnosis not present

## 2024-04-20 DIAGNOSIS — I2510 Atherosclerotic heart disease of native coronary artery without angina pectoris: Secondary | ICD-10-CM | POA: Diagnosis not present

## 2024-04-22 DIAGNOSIS — I2510 Atherosclerotic heart disease of native coronary artery without angina pectoris: Secondary | ICD-10-CM | POA: Diagnosis not present

## 2024-04-22 DIAGNOSIS — I472 Ventricular tachycardia, unspecified: Secondary | ICD-10-CM | POA: Diagnosis not present

## 2024-04-22 DIAGNOSIS — I255 Ischemic cardiomyopathy: Secondary | ICD-10-CM | POA: Diagnosis not present

## 2024-04-22 DIAGNOSIS — I4891 Unspecified atrial fibrillation: Secondary | ICD-10-CM | POA: Diagnosis not present

## 2024-04-22 DIAGNOSIS — E039 Hypothyroidism, unspecified: Secondary | ICD-10-CM | POA: Diagnosis not present

## 2024-04-22 DIAGNOSIS — I5022 Chronic systolic (congestive) heart failure: Secondary | ICD-10-CM | POA: Diagnosis not present

## 2024-04-27 DIAGNOSIS — I5022 Chronic systolic (congestive) heart failure: Secondary | ICD-10-CM | POA: Diagnosis not present

## 2024-04-27 DIAGNOSIS — I472 Ventricular tachycardia, unspecified: Secondary | ICD-10-CM | POA: Diagnosis not present

## 2024-04-27 DIAGNOSIS — E039 Hypothyroidism, unspecified: Secondary | ICD-10-CM | POA: Diagnosis not present

## 2024-04-27 DIAGNOSIS — I255 Ischemic cardiomyopathy: Secondary | ICD-10-CM | POA: Diagnosis not present

## 2024-04-27 DIAGNOSIS — I2510 Atherosclerotic heart disease of native coronary artery without angina pectoris: Secondary | ICD-10-CM | POA: Diagnosis not present

## 2024-04-27 DIAGNOSIS — I4891 Unspecified atrial fibrillation: Secondary | ICD-10-CM | POA: Diagnosis not present

## 2024-04-28 DIAGNOSIS — I4891 Unspecified atrial fibrillation: Secondary | ICD-10-CM | POA: Diagnosis not present

## 2024-04-28 DIAGNOSIS — E785 Hyperlipidemia, unspecified: Secondary | ICD-10-CM | POA: Diagnosis not present

## 2024-04-28 DIAGNOSIS — I255 Ischemic cardiomyopathy: Secondary | ICD-10-CM | POA: Diagnosis not present

## 2024-04-28 DIAGNOSIS — I5022 Chronic systolic (congestive) heart failure: Secondary | ICD-10-CM | POA: Diagnosis not present

## 2024-04-28 DIAGNOSIS — I2510 Atherosclerotic heart disease of native coronary artery without angina pectoris: Secondary | ICD-10-CM | POA: Diagnosis not present

## 2024-04-28 DIAGNOSIS — E039 Hypothyroidism, unspecified: Secondary | ICD-10-CM | POA: Diagnosis not present

## 2024-04-28 DIAGNOSIS — F0390 Unspecified dementia without behavioral disturbance: Secondary | ICD-10-CM | POA: Diagnosis not present

## 2024-04-28 DIAGNOSIS — I472 Ventricular tachycardia, unspecified: Secondary | ICD-10-CM | POA: Diagnosis not present

## 2024-04-28 DIAGNOSIS — K4090 Unilateral inguinal hernia, without obstruction or gangrene, not specified as recurrent: Secondary | ICD-10-CM | POA: Diagnosis not present

## 2024-04-28 DIAGNOSIS — Z66 Do not resuscitate: Secondary | ICD-10-CM | POA: Diagnosis not present

## 2024-04-29 DIAGNOSIS — I5022 Chronic systolic (congestive) heart failure: Secondary | ICD-10-CM | POA: Diagnosis not present

## 2024-04-29 DIAGNOSIS — E039 Hypothyroidism, unspecified: Secondary | ICD-10-CM | POA: Diagnosis not present

## 2024-04-29 DIAGNOSIS — I4891 Unspecified atrial fibrillation: Secondary | ICD-10-CM | POA: Diagnosis not present

## 2024-04-29 DIAGNOSIS — I255 Ischemic cardiomyopathy: Secondary | ICD-10-CM | POA: Diagnosis not present

## 2024-04-29 DIAGNOSIS — I2510 Atherosclerotic heart disease of native coronary artery without angina pectoris: Secondary | ICD-10-CM | POA: Diagnosis not present

## 2024-04-29 DIAGNOSIS — I472 Ventricular tachycardia, unspecified: Secondary | ICD-10-CM | POA: Diagnosis not present

## 2024-05-04 DIAGNOSIS — I4891 Unspecified atrial fibrillation: Secondary | ICD-10-CM | POA: Diagnosis not present

## 2024-05-04 DIAGNOSIS — E039 Hypothyroidism, unspecified: Secondary | ICD-10-CM | POA: Diagnosis not present

## 2024-05-04 DIAGNOSIS — I472 Ventricular tachycardia, unspecified: Secondary | ICD-10-CM | POA: Diagnosis not present

## 2024-05-04 DIAGNOSIS — I5022 Chronic systolic (congestive) heart failure: Secondary | ICD-10-CM | POA: Diagnosis not present

## 2024-05-04 DIAGNOSIS — I2510 Atherosclerotic heart disease of native coronary artery without angina pectoris: Secondary | ICD-10-CM | POA: Diagnosis not present

## 2024-05-04 DIAGNOSIS — I255 Ischemic cardiomyopathy: Secondary | ICD-10-CM | POA: Diagnosis not present

## 2024-05-05 DIAGNOSIS — I2510 Atherosclerotic heart disease of native coronary artery without angina pectoris: Secondary | ICD-10-CM | POA: Diagnosis not present

## 2024-05-05 DIAGNOSIS — I5022 Chronic systolic (congestive) heart failure: Secondary | ICD-10-CM | POA: Diagnosis not present

## 2024-05-05 DIAGNOSIS — I472 Ventricular tachycardia, unspecified: Secondary | ICD-10-CM | POA: Diagnosis not present

## 2024-05-05 DIAGNOSIS — I255 Ischemic cardiomyopathy: Secondary | ICD-10-CM | POA: Diagnosis not present

## 2024-05-05 DIAGNOSIS — E039 Hypothyroidism, unspecified: Secondary | ICD-10-CM | POA: Diagnosis not present

## 2024-05-05 DIAGNOSIS — I4891 Unspecified atrial fibrillation: Secondary | ICD-10-CM | POA: Diagnosis not present

## 2024-05-06 DIAGNOSIS — I472 Ventricular tachycardia, unspecified: Secondary | ICD-10-CM | POA: Diagnosis not present

## 2024-05-06 DIAGNOSIS — E039 Hypothyroidism, unspecified: Secondary | ICD-10-CM | POA: Diagnosis not present

## 2024-05-06 DIAGNOSIS — I2510 Atherosclerotic heart disease of native coronary artery without angina pectoris: Secondary | ICD-10-CM | POA: Diagnosis not present

## 2024-05-06 DIAGNOSIS — I5022 Chronic systolic (congestive) heart failure: Secondary | ICD-10-CM | POA: Diagnosis not present

## 2024-05-06 DIAGNOSIS — I4891 Unspecified atrial fibrillation: Secondary | ICD-10-CM | POA: Diagnosis not present

## 2024-05-06 DIAGNOSIS — I255 Ischemic cardiomyopathy: Secondary | ICD-10-CM | POA: Diagnosis not present

## 2024-05-09 DIAGNOSIS — E039 Hypothyroidism, unspecified: Secondary | ICD-10-CM | POA: Diagnosis not present

## 2024-05-09 DIAGNOSIS — I4891 Unspecified atrial fibrillation: Secondary | ICD-10-CM | POA: Diagnosis not present

## 2024-05-09 DIAGNOSIS — I5022 Chronic systolic (congestive) heart failure: Secondary | ICD-10-CM | POA: Diagnosis not present

## 2024-05-09 DIAGNOSIS — I255 Ischemic cardiomyopathy: Secondary | ICD-10-CM | POA: Diagnosis not present

## 2024-05-09 DIAGNOSIS — I2510 Atherosclerotic heart disease of native coronary artery without angina pectoris: Secondary | ICD-10-CM | POA: Diagnosis not present

## 2024-05-09 DIAGNOSIS — I472 Ventricular tachycardia, unspecified: Secondary | ICD-10-CM | POA: Diagnosis not present

## 2024-05-11 DIAGNOSIS — I255 Ischemic cardiomyopathy: Secondary | ICD-10-CM | POA: Diagnosis not present

## 2024-05-11 DIAGNOSIS — I4891 Unspecified atrial fibrillation: Secondary | ICD-10-CM | POA: Diagnosis not present

## 2024-05-11 DIAGNOSIS — E039 Hypothyroidism, unspecified: Secondary | ICD-10-CM | POA: Diagnosis not present

## 2024-05-11 DIAGNOSIS — I5022 Chronic systolic (congestive) heart failure: Secondary | ICD-10-CM | POA: Diagnosis not present

## 2024-05-11 DIAGNOSIS — I472 Ventricular tachycardia, unspecified: Secondary | ICD-10-CM | POA: Diagnosis not present

## 2024-05-11 DIAGNOSIS — I2510 Atherosclerotic heart disease of native coronary artery without angina pectoris: Secondary | ICD-10-CM | POA: Diagnosis not present

## 2024-05-13 DIAGNOSIS — I255 Ischemic cardiomyopathy: Secondary | ICD-10-CM | POA: Diagnosis not present

## 2024-05-13 DIAGNOSIS — I472 Ventricular tachycardia, unspecified: Secondary | ICD-10-CM | POA: Diagnosis not present

## 2024-05-13 DIAGNOSIS — I5022 Chronic systolic (congestive) heart failure: Secondary | ICD-10-CM | POA: Diagnosis not present

## 2024-05-13 DIAGNOSIS — I2510 Atherosclerotic heart disease of native coronary artery without angina pectoris: Secondary | ICD-10-CM | POA: Diagnosis not present

## 2024-05-13 DIAGNOSIS — E039 Hypothyroidism, unspecified: Secondary | ICD-10-CM | POA: Diagnosis not present

## 2024-05-13 DIAGNOSIS — I4891 Unspecified atrial fibrillation: Secondary | ICD-10-CM | POA: Diagnosis not present

## 2024-05-16 DIAGNOSIS — I255 Ischemic cardiomyopathy: Secondary | ICD-10-CM | POA: Diagnosis not present

## 2024-05-16 DIAGNOSIS — I4891 Unspecified atrial fibrillation: Secondary | ICD-10-CM | POA: Diagnosis not present

## 2024-05-16 DIAGNOSIS — E039 Hypothyroidism, unspecified: Secondary | ICD-10-CM | POA: Diagnosis not present

## 2024-05-16 DIAGNOSIS — I5022 Chronic systolic (congestive) heart failure: Secondary | ICD-10-CM | POA: Diagnosis not present

## 2024-05-16 DIAGNOSIS — I2510 Atherosclerotic heart disease of native coronary artery without angina pectoris: Secondary | ICD-10-CM | POA: Diagnosis not present

## 2024-05-16 DIAGNOSIS — I472 Ventricular tachycardia, unspecified: Secondary | ICD-10-CM | POA: Diagnosis not present

## 2024-05-18 DIAGNOSIS — I5022 Chronic systolic (congestive) heart failure: Secondary | ICD-10-CM | POA: Diagnosis not present

## 2024-05-18 DIAGNOSIS — I472 Ventricular tachycardia, unspecified: Secondary | ICD-10-CM | POA: Diagnosis not present

## 2024-05-18 DIAGNOSIS — I2510 Atherosclerotic heart disease of native coronary artery without angina pectoris: Secondary | ICD-10-CM | POA: Diagnosis not present

## 2024-05-18 DIAGNOSIS — I255 Ischemic cardiomyopathy: Secondary | ICD-10-CM | POA: Diagnosis not present

## 2024-05-18 DIAGNOSIS — I4891 Unspecified atrial fibrillation: Secondary | ICD-10-CM | POA: Diagnosis not present

## 2024-05-18 DIAGNOSIS — E039 Hypothyroidism, unspecified: Secondary | ICD-10-CM | POA: Diagnosis not present

## 2024-05-20 DIAGNOSIS — I2510 Atherosclerotic heart disease of native coronary artery without angina pectoris: Secondary | ICD-10-CM | POA: Diagnosis not present

## 2024-05-20 DIAGNOSIS — E039 Hypothyroidism, unspecified: Secondary | ICD-10-CM | POA: Diagnosis not present

## 2024-05-20 DIAGNOSIS — I5022 Chronic systolic (congestive) heart failure: Secondary | ICD-10-CM | POA: Diagnosis not present

## 2024-05-20 DIAGNOSIS — I4891 Unspecified atrial fibrillation: Secondary | ICD-10-CM | POA: Diagnosis not present

## 2024-05-20 DIAGNOSIS — I255 Ischemic cardiomyopathy: Secondary | ICD-10-CM | POA: Diagnosis not present

## 2024-05-20 DIAGNOSIS — I472 Ventricular tachycardia, unspecified: Secondary | ICD-10-CM | POA: Diagnosis not present

## 2024-05-25 DIAGNOSIS — I255 Ischemic cardiomyopathy: Secondary | ICD-10-CM | POA: Diagnosis not present

## 2024-05-25 DIAGNOSIS — I2510 Atherosclerotic heart disease of native coronary artery without angina pectoris: Secondary | ICD-10-CM | POA: Diagnosis not present

## 2024-05-25 DIAGNOSIS — E039 Hypothyroidism, unspecified: Secondary | ICD-10-CM | POA: Diagnosis not present

## 2024-05-25 DIAGNOSIS — I4891 Unspecified atrial fibrillation: Secondary | ICD-10-CM | POA: Diagnosis not present

## 2024-05-25 DIAGNOSIS — I472 Ventricular tachycardia, unspecified: Secondary | ICD-10-CM | POA: Diagnosis not present

## 2024-05-25 DIAGNOSIS — I5022 Chronic systolic (congestive) heart failure: Secondary | ICD-10-CM | POA: Diagnosis not present

## 2024-05-26 DIAGNOSIS — I2510 Atherosclerotic heart disease of native coronary artery without angina pectoris: Secondary | ICD-10-CM | POA: Diagnosis not present

## 2024-05-26 DIAGNOSIS — I4891 Unspecified atrial fibrillation: Secondary | ICD-10-CM | POA: Diagnosis not present

## 2024-05-26 DIAGNOSIS — I5022 Chronic systolic (congestive) heart failure: Secondary | ICD-10-CM | POA: Diagnosis not present

## 2024-05-26 DIAGNOSIS — I472 Ventricular tachycardia, unspecified: Secondary | ICD-10-CM | POA: Diagnosis not present

## 2024-05-26 DIAGNOSIS — E039 Hypothyroidism, unspecified: Secondary | ICD-10-CM | POA: Diagnosis not present

## 2024-05-26 DIAGNOSIS — I255 Ischemic cardiomyopathy: Secondary | ICD-10-CM | POA: Diagnosis not present

## 2024-05-27 DIAGNOSIS — I2510 Atherosclerotic heart disease of native coronary artery without angina pectoris: Secondary | ICD-10-CM | POA: Diagnosis not present

## 2024-05-27 DIAGNOSIS — I472 Ventricular tachycardia, unspecified: Secondary | ICD-10-CM | POA: Diagnosis not present

## 2024-05-27 DIAGNOSIS — I4891 Unspecified atrial fibrillation: Secondary | ICD-10-CM | POA: Diagnosis not present

## 2024-05-27 DIAGNOSIS — E039 Hypothyroidism, unspecified: Secondary | ICD-10-CM | POA: Diagnosis not present

## 2024-05-27 DIAGNOSIS — I255 Ischemic cardiomyopathy: Secondary | ICD-10-CM | POA: Diagnosis not present

## 2024-05-27 DIAGNOSIS — I5022 Chronic systolic (congestive) heart failure: Secondary | ICD-10-CM | POA: Diagnosis not present

## 2024-05-29 DIAGNOSIS — F0390 Unspecified dementia without behavioral disturbance: Secondary | ICD-10-CM | POA: Diagnosis not present

## 2024-05-29 DIAGNOSIS — E785 Hyperlipidemia, unspecified: Secondary | ICD-10-CM | POA: Diagnosis not present

## 2024-05-29 DIAGNOSIS — I5022 Chronic systolic (congestive) heart failure: Secondary | ICD-10-CM | POA: Diagnosis not present

## 2024-05-29 DIAGNOSIS — I472 Ventricular tachycardia, unspecified: Secondary | ICD-10-CM | POA: Diagnosis not present

## 2024-05-29 DIAGNOSIS — K4090 Unilateral inguinal hernia, without obstruction or gangrene, not specified as recurrent: Secondary | ICD-10-CM | POA: Diagnosis not present

## 2024-05-29 DIAGNOSIS — E039 Hypothyroidism, unspecified: Secondary | ICD-10-CM | POA: Diagnosis not present

## 2024-05-29 DIAGNOSIS — I4891 Unspecified atrial fibrillation: Secondary | ICD-10-CM | POA: Diagnosis not present

## 2024-05-29 DIAGNOSIS — Z66 Do not resuscitate: Secondary | ICD-10-CM | POA: Diagnosis not present

## 2024-05-29 DIAGNOSIS — I2510 Atherosclerotic heart disease of native coronary artery without angina pectoris: Secondary | ICD-10-CM | POA: Diagnosis not present

## 2024-05-29 DIAGNOSIS — I255 Ischemic cardiomyopathy: Secondary | ICD-10-CM | POA: Diagnosis not present

## 2024-05-30 DIAGNOSIS — I4891 Unspecified atrial fibrillation: Secondary | ICD-10-CM | POA: Diagnosis not present

## 2024-05-30 DIAGNOSIS — E039 Hypothyroidism, unspecified: Secondary | ICD-10-CM | POA: Diagnosis not present

## 2024-05-30 DIAGNOSIS — I255 Ischemic cardiomyopathy: Secondary | ICD-10-CM | POA: Diagnosis not present

## 2024-05-30 DIAGNOSIS — I472 Ventricular tachycardia, unspecified: Secondary | ICD-10-CM | POA: Diagnosis not present

## 2024-05-30 DIAGNOSIS — I2510 Atherosclerotic heart disease of native coronary artery without angina pectoris: Secondary | ICD-10-CM | POA: Diagnosis not present

## 2024-05-30 DIAGNOSIS — I5022 Chronic systolic (congestive) heart failure: Secondary | ICD-10-CM | POA: Diagnosis not present

## 2024-06-01 DIAGNOSIS — I5022 Chronic systolic (congestive) heart failure: Secondary | ICD-10-CM | POA: Diagnosis not present

## 2024-06-01 DIAGNOSIS — I4891 Unspecified atrial fibrillation: Secondary | ICD-10-CM | POA: Diagnosis not present

## 2024-06-01 DIAGNOSIS — I2510 Atherosclerotic heart disease of native coronary artery without angina pectoris: Secondary | ICD-10-CM | POA: Diagnosis not present

## 2024-06-01 DIAGNOSIS — E039 Hypothyroidism, unspecified: Secondary | ICD-10-CM | POA: Diagnosis not present

## 2024-06-01 DIAGNOSIS — I472 Ventricular tachycardia, unspecified: Secondary | ICD-10-CM | POA: Diagnosis not present

## 2024-06-01 DIAGNOSIS — I255 Ischemic cardiomyopathy: Secondary | ICD-10-CM | POA: Diagnosis not present

## 2024-06-08 DIAGNOSIS — E039 Hypothyroidism, unspecified: Secondary | ICD-10-CM | POA: Diagnosis not present

## 2024-06-08 DIAGNOSIS — I255 Ischemic cardiomyopathy: Secondary | ICD-10-CM | POA: Diagnosis not present

## 2024-06-08 DIAGNOSIS — I472 Ventricular tachycardia, unspecified: Secondary | ICD-10-CM | POA: Diagnosis not present

## 2024-06-08 DIAGNOSIS — I4891 Unspecified atrial fibrillation: Secondary | ICD-10-CM | POA: Diagnosis not present

## 2024-06-08 DIAGNOSIS — I2510 Atherosclerotic heart disease of native coronary artery without angina pectoris: Secondary | ICD-10-CM | POA: Diagnosis not present

## 2024-06-08 DIAGNOSIS — I5022 Chronic systolic (congestive) heart failure: Secondary | ICD-10-CM | POA: Diagnosis not present

## 2024-06-10 DIAGNOSIS — I472 Ventricular tachycardia, unspecified: Secondary | ICD-10-CM | POA: Diagnosis not present

## 2024-06-10 DIAGNOSIS — I4891 Unspecified atrial fibrillation: Secondary | ICD-10-CM | POA: Diagnosis not present

## 2024-06-10 DIAGNOSIS — I5022 Chronic systolic (congestive) heart failure: Secondary | ICD-10-CM | POA: Diagnosis not present

## 2024-06-10 DIAGNOSIS — I2510 Atherosclerotic heart disease of native coronary artery without angina pectoris: Secondary | ICD-10-CM | POA: Diagnosis not present

## 2024-06-10 DIAGNOSIS — I255 Ischemic cardiomyopathy: Secondary | ICD-10-CM | POA: Diagnosis not present

## 2024-06-10 DIAGNOSIS — E039 Hypothyroidism, unspecified: Secondary | ICD-10-CM | POA: Diagnosis not present

## 2024-06-14 DIAGNOSIS — I472 Ventricular tachycardia, unspecified: Secondary | ICD-10-CM | POA: Diagnosis not present

## 2024-06-14 DIAGNOSIS — I255 Ischemic cardiomyopathy: Secondary | ICD-10-CM | POA: Diagnosis not present

## 2024-06-14 DIAGNOSIS — I5022 Chronic systolic (congestive) heart failure: Secondary | ICD-10-CM | POA: Diagnosis not present

## 2024-06-14 DIAGNOSIS — I2510 Atherosclerotic heart disease of native coronary artery without angina pectoris: Secondary | ICD-10-CM | POA: Diagnosis not present

## 2024-06-14 DIAGNOSIS — E039 Hypothyroidism, unspecified: Secondary | ICD-10-CM | POA: Diagnosis not present

## 2024-06-14 DIAGNOSIS — I4891 Unspecified atrial fibrillation: Secondary | ICD-10-CM | POA: Diagnosis not present

## 2024-06-15 DIAGNOSIS — I255 Ischemic cardiomyopathy: Secondary | ICD-10-CM | POA: Diagnosis not present

## 2024-06-15 DIAGNOSIS — I2510 Atherosclerotic heart disease of native coronary artery without angina pectoris: Secondary | ICD-10-CM | POA: Diagnosis not present

## 2024-06-15 DIAGNOSIS — E039 Hypothyroidism, unspecified: Secondary | ICD-10-CM | POA: Diagnosis not present

## 2024-06-15 DIAGNOSIS — I5022 Chronic systolic (congestive) heart failure: Secondary | ICD-10-CM | POA: Diagnosis not present

## 2024-06-15 DIAGNOSIS — I4891 Unspecified atrial fibrillation: Secondary | ICD-10-CM | POA: Diagnosis not present

## 2024-06-15 DIAGNOSIS — I472 Ventricular tachycardia, unspecified: Secondary | ICD-10-CM | POA: Diagnosis not present

## 2024-06-16 DIAGNOSIS — I2510 Atherosclerotic heart disease of native coronary artery without angina pectoris: Secondary | ICD-10-CM | POA: Diagnosis not present

## 2024-06-16 DIAGNOSIS — I255 Ischemic cardiomyopathy: Secondary | ICD-10-CM | POA: Diagnosis not present

## 2024-06-16 DIAGNOSIS — I5022 Chronic systolic (congestive) heart failure: Secondary | ICD-10-CM | POA: Diagnosis not present

## 2024-06-16 DIAGNOSIS — I472 Ventricular tachycardia, unspecified: Secondary | ICD-10-CM | POA: Diagnosis not present

## 2024-06-16 DIAGNOSIS — E039 Hypothyroidism, unspecified: Secondary | ICD-10-CM | POA: Diagnosis not present

## 2024-06-16 DIAGNOSIS — I4891 Unspecified atrial fibrillation: Secondary | ICD-10-CM | POA: Diagnosis not present

## 2024-06-17 DIAGNOSIS — I4891 Unspecified atrial fibrillation: Secondary | ICD-10-CM | POA: Diagnosis not present

## 2024-06-17 DIAGNOSIS — I472 Ventricular tachycardia, unspecified: Secondary | ICD-10-CM | POA: Diagnosis not present

## 2024-06-17 DIAGNOSIS — E039 Hypothyroidism, unspecified: Secondary | ICD-10-CM | POA: Diagnosis not present

## 2024-06-17 DIAGNOSIS — I255 Ischemic cardiomyopathy: Secondary | ICD-10-CM | POA: Diagnosis not present

## 2024-06-17 DIAGNOSIS — I2510 Atherosclerotic heart disease of native coronary artery without angina pectoris: Secondary | ICD-10-CM | POA: Diagnosis not present

## 2024-06-17 DIAGNOSIS — I5022 Chronic systolic (congestive) heart failure: Secondary | ICD-10-CM | POA: Diagnosis not present

## 2024-06-22 DIAGNOSIS — I5022 Chronic systolic (congestive) heart failure: Secondary | ICD-10-CM | POA: Diagnosis not present

## 2024-06-22 DIAGNOSIS — E039 Hypothyroidism, unspecified: Secondary | ICD-10-CM | POA: Diagnosis not present

## 2024-06-22 DIAGNOSIS — I255 Ischemic cardiomyopathy: Secondary | ICD-10-CM | POA: Diagnosis not present

## 2024-06-22 DIAGNOSIS — I2510 Atherosclerotic heart disease of native coronary artery without angina pectoris: Secondary | ICD-10-CM | POA: Diagnosis not present

## 2024-06-22 DIAGNOSIS — I472 Ventricular tachycardia, unspecified: Secondary | ICD-10-CM | POA: Diagnosis not present

## 2024-06-22 DIAGNOSIS — I4891 Unspecified atrial fibrillation: Secondary | ICD-10-CM | POA: Diagnosis not present

## 2024-06-24 DIAGNOSIS — I472 Ventricular tachycardia, unspecified: Secondary | ICD-10-CM | POA: Diagnosis not present

## 2024-06-24 DIAGNOSIS — I5022 Chronic systolic (congestive) heart failure: Secondary | ICD-10-CM | POA: Diagnosis not present

## 2024-06-24 DIAGNOSIS — I4891 Unspecified atrial fibrillation: Secondary | ICD-10-CM | POA: Diagnosis not present

## 2024-06-24 DIAGNOSIS — I2510 Atherosclerotic heart disease of native coronary artery without angina pectoris: Secondary | ICD-10-CM | POA: Diagnosis not present

## 2024-06-24 DIAGNOSIS — E039 Hypothyroidism, unspecified: Secondary | ICD-10-CM | POA: Diagnosis not present

## 2024-06-24 DIAGNOSIS — I255 Ischemic cardiomyopathy: Secondary | ICD-10-CM | POA: Diagnosis not present

## 2024-06-27 DIAGNOSIS — E039 Hypothyroidism, unspecified: Secondary | ICD-10-CM | POA: Diagnosis not present

## 2024-06-27 DIAGNOSIS — I255 Ischemic cardiomyopathy: Secondary | ICD-10-CM | POA: Diagnosis not present

## 2024-06-27 DIAGNOSIS — I5022 Chronic systolic (congestive) heart failure: Secondary | ICD-10-CM | POA: Diagnosis not present

## 2024-06-27 DIAGNOSIS — I2510 Atherosclerotic heart disease of native coronary artery without angina pectoris: Secondary | ICD-10-CM | POA: Diagnosis not present

## 2024-06-27 DIAGNOSIS — I472 Ventricular tachycardia, unspecified: Secondary | ICD-10-CM | POA: Diagnosis not present

## 2024-06-27 DIAGNOSIS — I4891 Unspecified atrial fibrillation: Secondary | ICD-10-CM | POA: Diagnosis not present

## 2024-06-28 DIAGNOSIS — I2510 Atherosclerotic heart disease of native coronary artery without angina pectoris: Secondary | ICD-10-CM | POA: Diagnosis not present

## 2024-06-28 DIAGNOSIS — F0390 Unspecified dementia without behavioral disturbance: Secondary | ICD-10-CM | POA: Diagnosis not present

## 2024-06-28 DIAGNOSIS — I5022 Chronic systolic (congestive) heart failure: Secondary | ICD-10-CM | POA: Diagnosis not present

## 2024-06-28 DIAGNOSIS — I4891 Unspecified atrial fibrillation: Secondary | ICD-10-CM | POA: Diagnosis not present

## 2024-06-28 DIAGNOSIS — E039 Hypothyroidism, unspecified: Secondary | ICD-10-CM | POA: Diagnosis not present

## 2024-06-28 DIAGNOSIS — K4090 Unilateral inguinal hernia, without obstruction or gangrene, not specified as recurrent: Secondary | ICD-10-CM | POA: Diagnosis not present

## 2024-06-28 DIAGNOSIS — I255 Ischemic cardiomyopathy: Secondary | ICD-10-CM | POA: Diagnosis not present

## 2024-06-28 DIAGNOSIS — I472 Ventricular tachycardia, unspecified: Secondary | ICD-10-CM | POA: Diagnosis not present

## 2024-06-28 DIAGNOSIS — E785 Hyperlipidemia, unspecified: Secondary | ICD-10-CM | POA: Diagnosis not present

## 2024-06-28 DIAGNOSIS — Z66 Do not resuscitate: Secondary | ICD-10-CM | POA: Diagnosis not present

## 2024-06-29 DIAGNOSIS — I2510 Atherosclerotic heart disease of native coronary artery without angina pectoris: Secondary | ICD-10-CM | POA: Diagnosis not present

## 2024-06-29 DIAGNOSIS — I255 Ischemic cardiomyopathy: Secondary | ICD-10-CM | POA: Diagnosis not present

## 2024-06-29 DIAGNOSIS — I472 Ventricular tachycardia, unspecified: Secondary | ICD-10-CM | POA: Diagnosis not present

## 2024-06-29 DIAGNOSIS — I5022 Chronic systolic (congestive) heart failure: Secondary | ICD-10-CM | POA: Diagnosis not present

## 2024-06-29 DIAGNOSIS — E039 Hypothyroidism, unspecified: Secondary | ICD-10-CM | POA: Diagnosis not present

## 2024-06-29 DIAGNOSIS — I4891 Unspecified atrial fibrillation: Secondary | ICD-10-CM | POA: Diagnosis not present

## 2024-07-04 DIAGNOSIS — I5022 Chronic systolic (congestive) heart failure: Secondary | ICD-10-CM | POA: Diagnosis not present

## 2024-07-04 DIAGNOSIS — E039 Hypothyroidism, unspecified: Secondary | ICD-10-CM | POA: Diagnosis not present

## 2024-07-04 DIAGNOSIS — I2510 Atherosclerotic heart disease of native coronary artery without angina pectoris: Secondary | ICD-10-CM | POA: Diagnosis not present

## 2024-07-04 DIAGNOSIS — I255 Ischemic cardiomyopathy: Secondary | ICD-10-CM | POA: Diagnosis not present

## 2024-07-04 DIAGNOSIS — I472 Ventricular tachycardia, unspecified: Secondary | ICD-10-CM | POA: Diagnosis not present

## 2024-07-04 DIAGNOSIS — I4891 Unspecified atrial fibrillation: Secondary | ICD-10-CM | POA: Diagnosis not present

## 2024-07-06 DIAGNOSIS — E039 Hypothyroidism, unspecified: Secondary | ICD-10-CM | POA: Diagnosis not present

## 2024-07-06 DIAGNOSIS — I472 Ventricular tachycardia, unspecified: Secondary | ICD-10-CM | POA: Diagnosis not present

## 2024-07-06 DIAGNOSIS — I255 Ischemic cardiomyopathy: Secondary | ICD-10-CM | POA: Diagnosis not present

## 2024-07-06 DIAGNOSIS — I2510 Atherosclerotic heart disease of native coronary artery without angina pectoris: Secondary | ICD-10-CM | POA: Diagnosis not present

## 2024-07-06 DIAGNOSIS — I5022 Chronic systolic (congestive) heart failure: Secondary | ICD-10-CM | POA: Diagnosis not present

## 2024-07-06 DIAGNOSIS — I4891 Unspecified atrial fibrillation: Secondary | ICD-10-CM | POA: Diagnosis not present

## 2024-07-08 DIAGNOSIS — E039 Hypothyroidism, unspecified: Secondary | ICD-10-CM | POA: Diagnosis not present

## 2024-07-08 DIAGNOSIS — I472 Ventricular tachycardia, unspecified: Secondary | ICD-10-CM | POA: Diagnosis not present

## 2024-07-08 DIAGNOSIS — I2510 Atherosclerotic heart disease of native coronary artery without angina pectoris: Secondary | ICD-10-CM | POA: Diagnosis not present

## 2024-07-08 DIAGNOSIS — I4891 Unspecified atrial fibrillation: Secondary | ICD-10-CM | POA: Diagnosis not present

## 2024-07-08 DIAGNOSIS — I255 Ischemic cardiomyopathy: Secondary | ICD-10-CM | POA: Diagnosis not present

## 2024-07-08 DIAGNOSIS — I5022 Chronic systolic (congestive) heart failure: Secondary | ICD-10-CM | POA: Diagnosis not present

## 2024-07-11 DIAGNOSIS — I2510 Atherosclerotic heart disease of native coronary artery without angina pectoris: Secondary | ICD-10-CM | POA: Diagnosis not present

## 2024-07-11 DIAGNOSIS — I4891 Unspecified atrial fibrillation: Secondary | ICD-10-CM | POA: Diagnosis not present

## 2024-07-11 DIAGNOSIS — I472 Ventricular tachycardia, unspecified: Secondary | ICD-10-CM | POA: Diagnosis not present

## 2024-07-11 DIAGNOSIS — E039 Hypothyroidism, unspecified: Secondary | ICD-10-CM | POA: Diagnosis not present

## 2024-07-11 DIAGNOSIS — I5022 Chronic systolic (congestive) heart failure: Secondary | ICD-10-CM | POA: Diagnosis not present

## 2024-07-11 DIAGNOSIS — I255 Ischemic cardiomyopathy: Secondary | ICD-10-CM | POA: Diagnosis not present

## 2024-07-13 DIAGNOSIS — I255 Ischemic cardiomyopathy: Secondary | ICD-10-CM | POA: Diagnosis not present

## 2024-07-13 DIAGNOSIS — E039 Hypothyroidism, unspecified: Secondary | ICD-10-CM | POA: Diagnosis not present

## 2024-07-13 DIAGNOSIS — I2510 Atherosclerotic heart disease of native coronary artery without angina pectoris: Secondary | ICD-10-CM | POA: Diagnosis not present

## 2024-07-13 DIAGNOSIS — I5022 Chronic systolic (congestive) heart failure: Secondary | ICD-10-CM | POA: Diagnosis not present

## 2024-07-13 DIAGNOSIS — I472 Ventricular tachycardia, unspecified: Secondary | ICD-10-CM | POA: Diagnosis not present

## 2024-07-13 DIAGNOSIS — I4891 Unspecified atrial fibrillation: Secondary | ICD-10-CM | POA: Diagnosis not present

## 2024-07-14 DIAGNOSIS — I255 Ischemic cardiomyopathy: Secondary | ICD-10-CM | POA: Diagnosis not present

## 2024-07-14 DIAGNOSIS — E039 Hypothyroidism, unspecified: Secondary | ICD-10-CM | POA: Diagnosis not present

## 2024-07-14 DIAGNOSIS — I472 Ventricular tachycardia, unspecified: Secondary | ICD-10-CM | POA: Diagnosis not present

## 2024-07-14 DIAGNOSIS — I5022 Chronic systolic (congestive) heart failure: Secondary | ICD-10-CM | POA: Diagnosis not present

## 2024-07-14 DIAGNOSIS — I4891 Unspecified atrial fibrillation: Secondary | ICD-10-CM | POA: Diagnosis not present

## 2024-07-14 DIAGNOSIS — I2510 Atherosclerotic heart disease of native coronary artery without angina pectoris: Secondary | ICD-10-CM | POA: Diagnosis not present

## 2024-07-15 DIAGNOSIS — I255 Ischemic cardiomyopathy: Secondary | ICD-10-CM | POA: Diagnosis not present

## 2024-07-15 DIAGNOSIS — I472 Ventricular tachycardia, unspecified: Secondary | ICD-10-CM | POA: Diagnosis not present

## 2024-07-15 DIAGNOSIS — I2510 Atherosclerotic heart disease of native coronary artery without angina pectoris: Secondary | ICD-10-CM | POA: Diagnosis not present

## 2024-07-15 DIAGNOSIS — I4891 Unspecified atrial fibrillation: Secondary | ICD-10-CM | POA: Diagnosis not present

## 2024-07-15 DIAGNOSIS — E039 Hypothyroidism, unspecified: Secondary | ICD-10-CM | POA: Diagnosis not present

## 2024-07-15 DIAGNOSIS — I5022 Chronic systolic (congestive) heart failure: Secondary | ICD-10-CM | POA: Diagnosis not present

## 2024-07-19 DIAGNOSIS — I5022 Chronic systolic (congestive) heart failure: Secondary | ICD-10-CM | POA: Diagnosis not present

## 2024-07-19 DIAGNOSIS — I472 Ventricular tachycardia, unspecified: Secondary | ICD-10-CM | POA: Diagnosis not present

## 2024-07-19 DIAGNOSIS — I4891 Unspecified atrial fibrillation: Secondary | ICD-10-CM | POA: Diagnosis not present

## 2024-07-19 DIAGNOSIS — I2510 Atherosclerotic heart disease of native coronary artery without angina pectoris: Secondary | ICD-10-CM | POA: Diagnosis not present

## 2024-07-19 DIAGNOSIS — E039 Hypothyroidism, unspecified: Secondary | ICD-10-CM | POA: Diagnosis not present

## 2024-07-19 DIAGNOSIS — I255 Ischemic cardiomyopathy: Secondary | ICD-10-CM | POA: Diagnosis not present

## 2024-07-20 DIAGNOSIS — I472 Ventricular tachycardia, unspecified: Secondary | ICD-10-CM | POA: Diagnosis not present

## 2024-07-20 DIAGNOSIS — I4891 Unspecified atrial fibrillation: Secondary | ICD-10-CM | POA: Diagnosis not present

## 2024-07-20 DIAGNOSIS — I2510 Atherosclerotic heart disease of native coronary artery without angina pectoris: Secondary | ICD-10-CM | POA: Diagnosis not present

## 2024-07-20 DIAGNOSIS — I5022 Chronic systolic (congestive) heart failure: Secondary | ICD-10-CM | POA: Diagnosis not present

## 2024-07-20 DIAGNOSIS — E039 Hypothyroidism, unspecified: Secondary | ICD-10-CM | POA: Diagnosis not present

## 2024-07-20 DIAGNOSIS — I255 Ischemic cardiomyopathy: Secondary | ICD-10-CM | POA: Diagnosis not present

## 2024-07-21 DIAGNOSIS — I255 Ischemic cardiomyopathy: Secondary | ICD-10-CM | POA: Diagnosis not present

## 2024-07-21 DIAGNOSIS — E039 Hypothyroidism, unspecified: Secondary | ICD-10-CM | POA: Diagnosis not present

## 2024-07-21 DIAGNOSIS — I472 Ventricular tachycardia, unspecified: Secondary | ICD-10-CM | POA: Diagnosis not present

## 2024-07-21 DIAGNOSIS — I5022 Chronic systolic (congestive) heart failure: Secondary | ICD-10-CM | POA: Diagnosis not present

## 2024-07-21 DIAGNOSIS — I2510 Atherosclerotic heart disease of native coronary artery without angina pectoris: Secondary | ICD-10-CM | POA: Diagnosis not present

## 2024-07-21 DIAGNOSIS — I4891 Unspecified atrial fibrillation: Secondary | ICD-10-CM | POA: Diagnosis not present

## 2024-07-22 DIAGNOSIS — I255 Ischemic cardiomyopathy: Secondary | ICD-10-CM | POA: Diagnosis not present

## 2024-07-22 DIAGNOSIS — I4891 Unspecified atrial fibrillation: Secondary | ICD-10-CM | POA: Diagnosis not present

## 2024-07-22 DIAGNOSIS — I2510 Atherosclerotic heart disease of native coronary artery without angina pectoris: Secondary | ICD-10-CM | POA: Diagnosis not present

## 2024-07-22 DIAGNOSIS — I472 Ventricular tachycardia, unspecified: Secondary | ICD-10-CM | POA: Diagnosis not present

## 2024-07-22 DIAGNOSIS — E039 Hypothyroidism, unspecified: Secondary | ICD-10-CM | POA: Diagnosis not present

## 2024-07-22 DIAGNOSIS — I5022 Chronic systolic (congestive) heart failure: Secondary | ICD-10-CM | POA: Diagnosis not present

## 2024-07-25 DIAGNOSIS — I472 Ventricular tachycardia, unspecified: Secondary | ICD-10-CM | POA: Diagnosis not present

## 2024-07-25 DIAGNOSIS — I4891 Unspecified atrial fibrillation: Secondary | ICD-10-CM | POA: Diagnosis not present

## 2024-07-25 DIAGNOSIS — E039 Hypothyroidism, unspecified: Secondary | ICD-10-CM | POA: Diagnosis not present

## 2024-07-25 DIAGNOSIS — I255 Ischemic cardiomyopathy: Secondary | ICD-10-CM | POA: Diagnosis not present

## 2024-07-25 DIAGNOSIS — I5022 Chronic systolic (congestive) heart failure: Secondary | ICD-10-CM | POA: Diagnosis not present

## 2024-07-25 DIAGNOSIS — I2510 Atherosclerotic heart disease of native coronary artery without angina pectoris: Secondary | ICD-10-CM | POA: Diagnosis not present

## 2024-07-26 DIAGNOSIS — E039 Hypothyroidism, unspecified: Secondary | ICD-10-CM | POA: Diagnosis not present

## 2024-07-26 DIAGNOSIS — I472 Ventricular tachycardia, unspecified: Secondary | ICD-10-CM | POA: Diagnosis not present

## 2024-07-26 DIAGNOSIS — I4891 Unspecified atrial fibrillation: Secondary | ICD-10-CM | POA: Diagnosis not present

## 2024-07-26 DIAGNOSIS — I255 Ischemic cardiomyopathy: Secondary | ICD-10-CM | POA: Diagnosis not present

## 2024-07-26 DIAGNOSIS — I2510 Atherosclerotic heart disease of native coronary artery without angina pectoris: Secondary | ICD-10-CM | POA: Diagnosis not present

## 2024-07-26 DIAGNOSIS — I5022 Chronic systolic (congestive) heart failure: Secondary | ICD-10-CM | POA: Diagnosis not present

## 2024-07-28 DIAGNOSIS — I255 Ischemic cardiomyopathy: Secondary | ICD-10-CM | POA: Diagnosis not present

## 2024-07-28 DIAGNOSIS — I4891 Unspecified atrial fibrillation: Secondary | ICD-10-CM | POA: Diagnosis not present

## 2024-07-28 DIAGNOSIS — I472 Ventricular tachycardia, unspecified: Secondary | ICD-10-CM | POA: Diagnosis not present

## 2024-07-28 DIAGNOSIS — E039 Hypothyroidism, unspecified: Secondary | ICD-10-CM | POA: Diagnosis not present

## 2024-07-28 DIAGNOSIS — I5022 Chronic systolic (congestive) heart failure: Secondary | ICD-10-CM | POA: Diagnosis not present

## 2024-07-28 DIAGNOSIS — I2510 Atherosclerotic heart disease of native coronary artery without angina pectoris: Secondary | ICD-10-CM | POA: Diagnosis not present

## 2024-07-29 DIAGNOSIS — F0390 Unspecified dementia without behavioral disturbance: Secondary | ICD-10-CM | POA: Diagnosis not present

## 2024-07-29 DIAGNOSIS — I472 Ventricular tachycardia, unspecified: Secondary | ICD-10-CM | POA: Diagnosis not present

## 2024-07-29 DIAGNOSIS — I5022 Chronic systolic (congestive) heart failure: Secondary | ICD-10-CM | POA: Diagnosis not present

## 2024-07-29 DIAGNOSIS — E785 Hyperlipidemia, unspecified: Secondary | ICD-10-CM | POA: Diagnosis not present

## 2024-07-29 DIAGNOSIS — K4090 Unilateral inguinal hernia, without obstruction or gangrene, not specified as recurrent: Secondary | ICD-10-CM | POA: Diagnosis not present

## 2024-07-29 DIAGNOSIS — I255 Ischemic cardiomyopathy: Secondary | ICD-10-CM | POA: Diagnosis not present

## 2024-07-29 DIAGNOSIS — I2510 Atherosclerotic heart disease of native coronary artery without angina pectoris: Secondary | ICD-10-CM | POA: Diagnosis not present

## 2024-07-29 DIAGNOSIS — I4891 Unspecified atrial fibrillation: Secondary | ICD-10-CM | POA: Diagnosis not present

## 2024-07-29 DIAGNOSIS — Z66 Do not resuscitate: Secondary | ICD-10-CM | POA: Diagnosis not present

## 2024-07-29 DIAGNOSIS — E039 Hypothyroidism, unspecified: Secondary | ICD-10-CM | POA: Diagnosis not present

## 2024-08-02 DIAGNOSIS — I5022 Chronic systolic (congestive) heart failure: Secondary | ICD-10-CM | POA: Diagnosis not present

## 2024-08-02 DIAGNOSIS — I472 Ventricular tachycardia, unspecified: Secondary | ICD-10-CM | POA: Diagnosis not present

## 2024-08-02 DIAGNOSIS — I255 Ischemic cardiomyopathy: Secondary | ICD-10-CM | POA: Diagnosis not present

## 2024-08-02 DIAGNOSIS — I4891 Unspecified atrial fibrillation: Secondary | ICD-10-CM | POA: Diagnosis not present

## 2024-08-02 DIAGNOSIS — I2510 Atherosclerotic heart disease of native coronary artery without angina pectoris: Secondary | ICD-10-CM | POA: Diagnosis not present

## 2024-08-02 DIAGNOSIS — E039 Hypothyroidism, unspecified: Secondary | ICD-10-CM | POA: Diagnosis not present

## 2024-08-04 DIAGNOSIS — E039 Hypothyroidism, unspecified: Secondary | ICD-10-CM | POA: Diagnosis not present

## 2024-08-04 DIAGNOSIS — I2510 Atherosclerotic heart disease of native coronary artery without angina pectoris: Secondary | ICD-10-CM | POA: Diagnosis not present

## 2024-08-04 DIAGNOSIS — I4891 Unspecified atrial fibrillation: Secondary | ICD-10-CM | POA: Diagnosis not present

## 2024-08-04 DIAGNOSIS — I5022 Chronic systolic (congestive) heart failure: Secondary | ICD-10-CM | POA: Diagnosis not present

## 2024-08-04 DIAGNOSIS — I255 Ischemic cardiomyopathy: Secondary | ICD-10-CM | POA: Diagnosis not present

## 2024-08-04 DIAGNOSIS — I472 Ventricular tachycardia, unspecified: Secondary | ICD-10-CM | POA: Diagnosis not present

## 2024-08-09 DIAGNOSIS — I5022 Chronic systolic (congestive) heart failure: Secondary | ICD-10-CM | POA: Diagnosis not present

## 2024-08-09 DIAGNOSIS — I4891 Unspecified atrial fibrillation: Secondary | ICD-10-CM | POA: Diagnosis not present

## 2024-08-09 DIAGNOSIS — E039 Hypothyroidism, unspecified: Secondary | ICD-10-CM | POA: Diagnosis not present

## 2024-08-09 DIAGNOSIS — I472 Ventricular tachycardia, unspecified: Secondary | ICD-10-CM | POA: Diagnosis not present

## 2024-08-09 DIAGNOSIS — I255 Ischemic cardiomyopathy: Secondary | ICD-10-CM | POA: Diagnosis not present

## 2024-08-09 DIAGNOSIS — I2510 Atherosclerotic heart disease of native coronary artery without angina pectoris: Secondary | ICD-10-CM | POA: Diagnosis not present

## 2024-08-10 DIAGNOSIS — E039 Hypothyroidism, unspecified: Secondary | ICD-10-CM | POA: Diagnosis not present

## 2024-08-10 DIAGNOSIS — I2510 Atherosclerotic heart disease of native coronary artery without angina pectoris: Secondary | ICD-10-CM | POA: Diagnosis not present

## 2024-08-10 DIAGNOSIS — I472 Ventricular tachycardia, unspecified: Secondary | ICD-10-CM | POA: Diagnosis not present

## 2024-08-10 DIAGNOSIS — I255 Ischemic cardiomyopathy: Secondary | ICD-10-CM | POA: Diagnosis not present

## 2024-08-10 DIAGNOSIS — I5022 Chronic systolic (congestive) heart failure: Secondary | ICD-10-CM | POA: Diagnosis not present

## 2024-08-10 DIAGNOSIS — I4891 Unspecified atrial fibrillation: Secondary | ICD-10-CM | POA: Diagnosis not present

## 2024-08-11 DIAGNOSIS — I472 Ventricular tachycardia, unspecified: Secondary | ICD-10-CM | POA: Diagnosis not present

## 2024-08-11 DIAGNOSIS — I4891 Unspecified atrial fibrillation: Secondary | ICD-10-CM | POA: Diagnosis not present

## 2024-08-11 DIAGNOSIS — I255 Ischemic cardiomyopathy: Secondary | ICD-10-CM | POA: Diagnosis not present

## 2024-08-11 DIAGNOSIS — E039 Hypothyroidism, unspecified: Secondary | ICD-10-CM | POA: Diagnosis not present

## 2024-08-11 DIAGNOSIS — I2510 Atherosclerotic heart disease of native coronary artery without angina pectoris: Secondary | ICD-10-CM | POA: Diagnosis not present

## 2024-08-11 DIAGNOSIS — I5022 Chronic systolic (congestive) heart failure: Secondary | ICD-10-CM | POA: Diagnosis not present

## 2024-08-12 DIAGNOSIS — I255 Ischemic cardiomyopathy: Secondary | ICD-10-CM | POA: Diagnosis not present

## 2024-08-12 DIAGNOSIS — I472 Ventricular tachycardia, unspecified: Secondary | ICD-10-CM | POA: Diagnosis not present

## 2024-08-12 DIAGNOSIS — I2510 Atherosclerotic heart disease of native coronary artery without angina pectoris: Secondary | ICD-10-CM | POA: Diagnosis not present

## 2024-08-12 DIAGNOSIS — I4891 Unspecified atrial fibrillation: Secondary | ICD-10-CM | POA: Diagnosis not present

## 2024-08-12 DIAGNOSIS — I5022 Chronic systolic (congestive) heart failure: Secondary | ICD-10-CM | POA: Diagnosis not present

## 2024-08-12 DIAGNOSIS — E039 Hypothyroidism, unspecified: Secondary | ICD-10-CM | POA: Diagnosis not present

## 2024-08-16 DIAGNOSIS — I255 Ischemic cardiomyopathy: Secondary | ICD-10-CM | POA: Diagnosis not present

## 2024-08-16 DIAGNOSIS — E039 Hypothyroidism, unspecified: Secondary | ICD-10-CM | POA: Diagnosis not present

## 2024-08-16 DIAGNOSIS — I472 Ventricular tachycardia, unspecified: Secondary | ICD-10-CM | POA: Diagnosis not present

## 2024-08-16 DIAGNOSIS — I2510 Atherosclerotic heart disease of native coronary artery without angina pectoris: Secondary | ICD-10-CM | POA: Diagnosis not present

## 2024-08-16 DIAGNOSIS — I5022 Chronic systolic (congestive) heart failure: Secondary | ICD-10-CM | POA: Diagnosis not present

## 2024-08-16 DIAGNOSIS — I4891 Unspecified atrial fibrillation: Secondary | ICD-10-CM | POA: Diagnosis not present

## 2024-08-17 DIAGNOSIS — I472 Ventricular tachycardia, unspecified: Secondary | ICD-10-CM | POA: Diagnosis not present

## 2024-08-17 DIAGNOSIS — I5022 Chronic systolic (congestive) heart failure: Secondary | ICD-10-CM | POA: Diagnosis not present

## 2024-08-17 DIAGNOSIS — I255 Ischemic cardiomyopathy: Secondary | ICD-10-CM | POA: Diagnosis not present

## 2024-08-17 DIAGNOSIS — I4891 Unspecified atrial fibrillation: Secondary | ICD-10-CM | POA: Diagnosis not present

## 2024-08-17 DIAGNOSIS — I2510 Atherosclerotic heart disease of native coronary artery without angina pectoris: Secondary | ICD-10-CM | POA: Diagnosis not present

## 2024-08-17 DIAGNOSIS — E039 Hypothyroidism, unspecified: Secondary | ICD-10-CM | POA: Diagnosis not present

## 2024-08-19 DIAGNOSIS — E039 Hypothyroidism, unspecified: Secondary | ICD-10-CM | POA: Diagnosis not present

## 2024-08-19 DIAGNOSIS — I472 Ventricular tachycardia, unspecified: Secondary | ICD-10-CM | POA: Diagnosis not present

## 2024-08-19 DIAGNOSIS — I255 Ischemic cardiomyopathy: Secondary | ICD-10-CM | POA: Diagnosis not present

## 2024-08-19 DIAGNOSIS — I2510 Atherosclerotic heart disease of native coronary artery without angina pectoris: Secondary | ICD-10-CM | POA: Diagnosis not present

## 2024-08-19 DIAGNOSIS — I4891 Unspecified atrial fibrillation: Secondary | ICD-10-CM | POA: Diagnosis not present

## 2024-08-19 DIAGNOSIS — I5022 Chronic systolic (congestive) heart failure: Secondary | ICD-10-CM | POA: Diagnosis not present

## 2024-08-22 DIAGNOSIS — I2510 Atherosclerotic heart disease of native coronary artery without angina pectoris: Secondary | ICD-10-CM | POA: Diagnosis not present

## 2024-08-22 DIAGNOSIS — I472 Ventricular tachycardia, unspecified: Secondary | ICD-10-CM | POA: Diagnosis not present

## 2024-08-22 DIAGNOSIS — I4891 Unspecified atrial fibrillation: Secondary | ICD-10-CM | POA: Diagnosis not present

## 2024-08-22 DIAGNOSIS — E039 Hypothyroidism, unspecified: Secondary | ICD-10-CM | POA: Diagnosis not present

## 2024-08-22 DIAGNOSIS — I255 Ischemic cardiomyopathy: Secondary | ICD-10-CM | POA: Diagnosis not present

## 2024-08-22 DIAGNOSIS — I5022 Chronic systolic (congestive) heart failure: Secondary | ICD-10-CM | POA: Diagnosis not present

## 2024-08-23 DIAGNOSIS — I2510 Atherosclerotic heart disease of native coronary artery without angina pectoris: Secondary | ICD-10-CM | POA: Diagnosis not present

## 2024-08-23 DIAGNOSIS — I255 Ischemic cardiomyopathy: Secondary | ICD-10-CM | POA: Diagnosis not present

## 2024-08-23 DIAGNOSIS — I5022 Chronic systolic (congestive) heart failure: Secondary | ICD-10-CM | POA: Diagnosis not present

## 2024-08-23 DIAGNOSIS — E039 Hypothyroidism, unspecified: Secondary | ICD-10-CM | POA: Diagnosis not present

## 2024-08-23 DIAGNOSIS — I472 Ventricular tachycardia, unspecified: Secondary | ICD-10-CM | POA: Diagnosis not present

## 2024-08-23 DIAGNOSIS — I4891 Unspecified atrial fibrillation: Secondary | ICD-10-CM | POA: Diagnosis not present

## 2024-08-24 DIAGNOSIS — I5022 Chronic systolic (congestive) heart failure: Secondary | ICD-10-CM | POA: Diagnosis not present

## 2024-08-24 DIAGNOSIS — E039 Hypothyroidism, unspecified: Secondary | ICD-10-CM | POA: Diagnosis not present

## 2024-08-24 DIAGNOSIS — I472 Ventricular tachycardia, unspecified: Secondary | ICD-10-CM | POA: Diagnosis not present

## 2024-08-24 DIAGNOSIS — I2510 Atherosclerotic heart disease of native coronary artery without angina pectoris: Secondary | ICD-10-CM | POA: Diagnosis not present

## 2024-08-24 DIAGNOSIS — I4891 Unspecified atrial fibrillation: Secondary | ICD-10-CM | POA: Diagnosis not present

## 2024-08-24 DIAGNOSIS — I255 Ischemic cardiomyopathy: Secondary | ICD-10-CM | POA: Diagnosis not present

## 2024-08-25 DIAGNOSIS — I2510 Atherosclerotic heart disease of native coronary artery without angina pectoris: Secondary | ICD-10-CM | POA: Diagnosis not present

## 2024-08-25 DIAGNOSIS — I255 Ischemic cardiomyopathy: Secondary | ICD-10-CM | POA: Diagnosis not present

## 2024-08-25 DIAGNOSIS — E039 Hypothyroidism, unspecified: Secondary | ICD-10-CM | POA: Diagnosis not present

## 2024-08-25 DIAGNOSIS — I5022 Chronic systolic (congestive) heart failure: Secondary | ICD-10-CM | POA: Diagnosis not present

## 2024-08-25 DIAGNOSIS — I472 Ventricular tachycardia, unspecified: Secondary | ICD-10-CM | POA: Diagnosis not present

## 2024-08-25 DIAGNOSIS — I4891 Unspecified atrial fibrillation: Secondary | ICD-10-CM | POA: Diagnosis not present

## 2024-08-29 DIAGNOSIS — Z66 Do not resuscitate: Secondary | ICD-10-CM | POA: Diagnosis not present

## 2024-08-29 DIAGNOSIS — F0390 Unspecified dementia without behavioral disturbance: Secondary | ICD-10-CM | POA: Diagnosis not present

## 2024-08-29 DIAGNOSIS — I2510 Atherosclerotic heart disease of native coronary artery without angina pectoris: Secondary | ICD-10-CM | POA: Diagnosis not present

## 2024-08-29 DIAGNOSIS — E785 Hyperlipidemia, unspecified: Secondary | ICD-10-CM | POA: Diagnosis not present

## 2024-08-29 DIAGNOSIS — E039 Hypothyroidism, unspecified: Secondary | ICD-10-CM | POA: Diagnosis not present

## 2024-08-29 DIAGNOSIS — I5022 Chronic systolic (congestive) heart failure: Secondary | ICD-10-CM | POA: Diagnosis not present

## 2024-08-29 DIAGNOSIS — I472 Ventricular tachycardia, unspecified: Secondary | ICD-10-CM | POA: Diagnosis not present

## 2024-08-29 DIAGNOSIS — K4090 Unilateral inguinal hernia, without obstruction or gangrene, not specified as recurrent: Secondary | ICD-10-CM | POA: Diagnosis not present

## 2024-08-29 DIAGNOSIS — I4891 Unspecified atrial fibrillation: Secondary | ICD-10-CM | POA: Diagnosis not present

## 2024-08-29 DIAGNOSIS — I255 Ischemic cardiomyopathy: Secondary | ICD-10-CM | POA: Diagnosis not present

## 2024-08-30 DIAGNOSIS — I2510 Atherosclerotic heart disease of native coronary artery without angina pectoris: Secondary | ICD-10-CM | POA: Diagnosis not present

## 2024-08-30 DIAGNOSIS — I4891 Unspecified atrial fibrillation: Secondary | ICD-10-CM | POA: Diagnosis not present

## 2024-08-30 DIAGNOSIS — I5022 Chronic systolic (congestive) heart failure: Secondary | ICD-10-CM | POA: Diagnosis not present

## 2024-08-30 DIAGNOSIS — I255 Ischemic cardiomyopathy: Secondary | ICD-10-CM | POA: Diagnosis not present

## 2024-08-30 DIAGNOSIS — I472 Ventricular tachycardia, unspecified: Secondary | ICD-10-CM | POA: Diagnosis not present

## 2024-08-30 DIAGNOSIS — E039 Hypothyroidism, unspecified: Secondary | ICD-10-CM | POA: Diagnosis not present

## 2024-08-31 DIAGNOSIS — I472 Ventricular tachycardia, unspecified: Secondary | ICD-10-CM | POA: Diagnosis not present

## 2024-08-31 DIAGNOSIS — I4891 Unspecified atrial fibrillation: Secondary | ICD-10-CM | POA: Diagnosis not present

## 2024-08-31 DIAGNOSIS — I255 Ischemic cardiomyopathy: Secondary | ICD-10-CM | POA: Diagnosis not present

## 2024-08-31 DIAGNOSIS — E039 Hypothyroidism, unspecified: Secondary | ICD-10-CM | POA: Diagnosis not present

## 2024-08-31 DIAGNOSIS — I2510 Atherosclerotic heart disease of native coronary artery without angina pectoris: Secondary | ICD-10-CM | POA: Diagnosis not present

## 2024-08-31 DIAGNOSIS — I5022 Chronic systolic (congestive) heart failure: Secondary | ICD-10-CM | POA: Diagnosis not present

## 2024-09-02 DIAGNOSIS — I4891 Unspecified atrial fibrillation: Secondary | ICD-10-CM | POA: Diagnosis not present

## 2024-09-02 DIAGNOSIS — E039 Hypothyroidism, unspecified: Secondary | ICD-10-CM | POA: Diagnosis not present

## 2024-09-02 DIAGNOSIS — I255 Ischemic cardiomyopathy: Secondary | ICD-10-CM | POA: Diagnosis not present

## 2024-09-02 DIAGNOSIS — I472 Ventricular tachycardia, unspecified: Secondary | ICD-10-CM | POA: Diagnosis not present

## 2024-09-02 DIAGNOSIS — I2510 Atherosclerotic heart disease of native coronary artery without angina pectoris: Secondary | ICD-10-CM | POA: Diagnosis not present

## 2024-09-02 DIAGNOSIS — I5022 Chronic systolic (congestive) heart failure: Secondary | ICD-10-CM | POA: Diagnosis not present

## 2024-09-05 DIAGNOSIS — E039 Hypothyroidism, unspecified: Secondary | ICD-10-CM | POA: Diagnosis not present

## 2024-09-05 DIAGNOSIS — I2510 Atherosclerotic heart disease of native coronary artery without angina pectoris: Secondary | ICD-10-CM | POA: Diagnosis not present

## 2024-09-05 DIAGNOSIS — I472 Ventricular tachycardia, unspecified: Secondary | ICD-10-CM | POA: Diagnosis not present

## 2024-09-05 DIAGNOSIS — I5022 Chronic systolic (congestive) heart failure: Secondary | ICD-10-CM | POA: Diagnosis not present

## 2024-09-05 DIAGNOSIS — I4891 Unspecified atrial fibrillation: Secondary | ICD-10-CM | POA: Diagnosis not present

## 2024-09-05 DIAGNOSIS — I255 Ischemic cardiomyopathy: Secondary | ICD-10-CM | POA: Diagnosis not present

## 2024-09-06 DIAGNOSIS — I5022 Chronic systolic (congestive) heart failure: Secondary | ICD-10-CM | POA: Diagnosis not present

## 2024-09-06 DIAGNOSIS — I4891 Unspecified atrial fibrillation: Secondary | ICD-10-CM | POA: Diagnosis not present

## 2024-09-06 DIAGNOSIS — I472 Ventricular tachycardia, unspecified: Secondary | ICD-10-CM | POA: Diagnosis not present

## 2024-09-06 DIAGNOSIS — E039 Hypothyroidism, unspecified: Secondary | ICD-10-CM | POA: Diagnosis not present

## 2024-09-06 DIAGNOSIS — I2510 Atherosclerotic heart disease of native coronary artery without angina pectoris: Secondary | ICD-10-CM | POA: Diagnosis not present

## 2024-09-06 DIAGNOSIS — I255 Ischemic cardiomyopathy: Secondary | ICD-10-CM | POA: Diagnosis not present

## 2024-09-07 DIAGNOSIS — I4891 Unspecified atrial fibrillation: Secondary | ICD-10-CM | POA: Diagnosis not present

## 2024-09-07 DIAGNOSIS — I2510 Atherosclerotic heart disease of native coronary artery without angina pectoris: Secondary | ICD-10-CM | POA: Diagnosis not present

## 2024-09-07 DIAGNOSIS — E039 Hypothyroidism, unspecified: Secondary | ICD-10-CM | POA: Diagnosis not present

## 2024-09-07 DIAGNOSIS — I255 Ischemic cardiomyopathy: Secondary | ICD-10-CM | POA: Diagnosis not present

## 2024-09-07 DIAGNOSIS — I472 Ventricular tachycardia, unspecified: Secondary | ICD-10-CM | POA: Diagnosis not present

## 2024-09-07 DIAGNOSIS — I5022 Chronic systolic (congestive) heart failure: Secondary | ICD-10-CM | POA: Diagnosis not present

## 2024-09-09 DIAGNOSIS — I255 Ischemic cardiomyopathy: Secondary | ICD-10-CM | POA: Diagnosis not present

## 2024-09-09 DIAGNOSIS — I5022 Chronic systolic (congestive) heart failure: Secondary | ICD-10-CM | POA: Diagnosis not present

## 2024-09-09 DIAGNOSIS — I472 Ventricular tachycardia, unspecified: Secondary | ICD-10-CM | POA: Diagnosis not present

## 2024-09-09 DIAGNOSIS — I2510 Atherosclerotic heart disease of native coronary artery without angina pectoris: Secondary | ICD-10-CM | POA: Diagnosis not present

## 2024-09-09 DIAGNOSIS — E039 Hypothyroidism, unspecified: Secondary | ICD-10-CM | POA: Diagnosis not present

## 2024-09-09 DIAGNOSIS — I4891 Unspecified atrial fibrillation: Secondary | ICD-10-CM | POA: Diagnosis not present

## 2024-09-12 DIAGNOSIS — I472 Ventricular tachycardia, unspecified: Secondary | ICD-10-CM | POA: Diagnosis not present

## 2024-09-12 DIAGNOSIS — I5022 Chronic systolic (congestive) heart failure: Secondary | ICD-10-CM | POA: Diagnosis not present

## 2024-09-12 DIAGNOSIS — I255 Ischemic cardiomyopathy: Secondary | ICD-10-CM | POA: Diagnosis not present

## 2024-09-12 DIAGNOSIS — I2510 Atherosclerotic heart disease of native coronary artery without angina pectoris: Secondary | ICD-10-CM | POA: Diagnosis not present

## 2024-09-12 DIAGNOSIS — I4891 Unspecified atrial fibrillation: Secondary | ICD-10-CM | POA: Diagnosis not present

## 2024-09-12 DIAGNOSIS — E039 Hypothyroidism, unspecified: Secondary | ICD-10-CM | POA: Diagnosis not present

## 2024-09-14 DIAGNOSIS — I255 Ischemic cardiomyopathy: Secondary | ICD-10-CM | POA: Diagnosis not present

## 2024-09-14 DIAGNOSIS — E039 Hypothyroidism, unspecified: Secondary | ICD-10-CM | POA: Diagnosis not present

## 2024-09-14 DIAGNOSIS — I2510 Atherosclerotic heart disease of native coronary artery without angina pectoris: Secondary | ICD-10-CM | POA: Diagnosis not present

## 2024-09-14 DIAGNOSIS — I472 Ventricular tachycardia, unspecified: Secondary | ICD-10-CM | POA: Diagnosis not present

## 2024-09-14 DIAGNOSIS — I5022 Chronic systolic (congestive) heart failure: Secondary | ICD-10-CM | POA: Diagnosis not present

## 2024-09-14 DIAGNOSIS — I4891 Unspecified atrial fibrillation: Secondary | ICD-10-CM | POA: Diagnosis not present

## 2024-09-16 DIAGNOSIS — I472 Ventricular tachycardia, unspecified: Secondary | ICD-10-CM | POA: Diagnosis not present

## 2024-09-16 DIAGNOSIS — I4891 Unspecified atrial fibrillation: Secondary | ICD-10-CM | POA: Diagnosis not present

## 2024-09-16 DIAGNOSIS — E039 Hypothyroidism, unspecified: Secondary | ICD-10-CM | POA: Diagnosis not present

## 2024-09-16 DIAGNOSIS — I5022 Chronic systolic (congestive) heart failure: Secondary | ICD-10-CM | POA: Diagnosis not present

## 2024-09-16 DIAGNOSIS — I2510 Atherosclerotic heart disease of native coronary artery without angina pectoris: Secondary | ICD-10-CM | POA: Diagnosis not present

## 2024-09-16 DIAGNOSIS — I255 Ischemic cardiomyopathy: Secondary | ICD-10-CM | POA: Diagnosis not present

## 2024-09-19 DIAGNOSIS — I4891 Unspecified atrial fibrillation: Secondary | ICD-10-CM | POA: Diagnosis not present

## 2024-09-19 DIAGNOSIS — I2510 Atherosclerotic heart disease of native coronary artery without angina pectoris: Secondary | ICD-10-CM | POA: Diagnosis not present

## 2024-09-19 DIAGNOSIS — E039 Hypothyroidism, unspecified: Secondary | ICD-10-CM | POA: Diagnosis not present

## 2024-09-19 DIAGNOSIS — I472 Ventricular tachycardia, unspecified: Secondary | ICD-10-CM | POA: Diagnosis not present

## 2024-09-19 DIAGNOSIS — I255 Ischemic cardiomyopathy: Secondary | ICD-10-CM | POA: Diagnosis not present

## 2024-09-19 DIAGNOSIS — I5022 Chronic systolic (congestive) heart failure: Secondary | ICD-10-CM | POA: Diagnosis not present

## 2024-09-20 DIAGNOSIS — I255 Ischemic cardiomyopathy: Secondary | ICD-10-CM | POA: Diagnosis not present

## 2024-09-20 DIAGNOSIS — I472 Ventricular tachycardia, unspecified: Secondary | ICD-10-CM | POA: Diagnosis not present

## 2024-09-20 DIAGNOSIS — I5022 Chronic systolic (congestive) heart failure: Secondary | ICD-10-CM | POA: Diagnosis not present

## 2024-09-20 DIAGNOSIS — I2510 Atherosclerotic heart disease of native coronary artery without angina pectoris: Secondary | ICD-10-CM | POA: Diagnosis not present

## 2024-09-20 DIAGNOSIS — E039 Hypothyroidism, unspecified: Secondary | ICD-10-CM | POA: Diagnosis not present

## 2024-09-20 DIAGNOSIS — I4891 Unspecified atrial fibrillation: Secondary | ICD-10-CM | POA: Diagnosis not present

## 2024-09-21 DIAGNOSIS — I472 Ventricular tachycardia, unspecified: Secondary | ICD-10-CM | POA: Diagnosis not present

## 2024-09-21 DIAGNOSIS — I4891 Unspecified atrial fibrillation: Secondary | ICD-10-CM | POA: Diagnosis not present

## 2024-09-21 DIAGNOSIS — I2510 Atherosclerotic heart disease of native coronary artery without angina pectoris: Secondary | ICD-10-CM | POA: Diagnosis not present

## 2024-09-21 DIAGNOSIS — I5022 Chronic systolic (congestive) heart failure: Secondary | ICD-10-CM | POA: Diagnosis not present

## 2024-09-21 DIAGNOSIS — I255 Ischemic cardiomyopathy: Secondary | ICD-10-CM | POA: Diagnosis not present

## 2024-09-21 DIAGNOSIS — E039 Hypothyroidism, unspecified: Secondary | ICD-10-CM | POA: Diagnosis not present

## 2024-09-22 DIAGNOSIS — I2510 Atherosclerotic heart disease of native coronary artery without angina pectoris: Secondary | ICD-10-CM | POA: Diagnosis not present

## 2024-09-22 DIAGNOSIS — E039 Hypothyroidism, unspecified: Secondary | ICD-10-CM | POA: Diagnosis not present

## 2024-09-22 DIAGNOSIS — I255 Ischemic cardiomyopathy: Secondary | ICD-10-CM | POA: Diagnosis not present

## 2024-09-22 DIAGNOSIS — I472 Ventricular tachycardia, unspecified: Secondary | ICD-10-CM | POA: Diagnosis not present

## 2024-09-22 DIAGNOSIS — I5022 Chronic systolic (congestive) heart failure: Secondary | ICD-10-CM | POA: Diagnosis not present

## 2024-09-22 DIAGNOSIS — I4891 Unspecified atrial fibrillation: Secondary | ICD-10-CM | POA: Diagnosis not present

## 2024-09-26 DIAGNOSIS — I4891 Unspecified atrial fibrillation: Secondary | ICD-10-CM | POA: Diagnosis not present

## 2024-09-26 DIAGNOSIS — I5022 Chronic systolic (congestive) heart failure: Secondary | ICD-10-CM | POA: Diagnosis not present

## 2024-09-26 DIAGNOSIS — E039 Hypothyroidism, unspecified: Secondary | ICD-10-CM | POA: Diagnosis not present

## 2024-09-26 DIAGNOSIS — I472 Ventricular tachycardia, unspecified: Secondary | ICD-10-CM | POA: Diagnosis not present

## 2024-09-26 DIAGNOSIS — I2510 Atherosclerotic heart disease of native coronary artery without angina pectoris: Secondary | ICD-10-CM | POA: Diagnosis not present

## 2024-09-26 DIAGNOSIS — I255 Ischemic cardiomyopathy: Secondary | ICD-10-CM | POA: Diagnosis not present

## 2024-09-27 DIAGNOSIS — I5022 Chronic systolic (congestive) heart failure: Secondary | ICD-10-CM | POA: Diagnosis not present

## 2024-09-27 DIAGNOSIS — E039 Hypothyroidism, unspecified: Secondary | ICD-10-CM | POA: Diagnosis not present

## 2024-09-27 DIAGNOSIS — I4891 Unspecified atrial fibrillation: Secondary | ICD-10-CM | POA: Diagnosis not present

## 2024-09-27 DIAGNOSIS — I2510 Atherosclerotic heart disease of native coronary artery without angina pectoris: Secondary | ICD-10-CM | POA: Diagnosis not present

## 2024-09-27 DIAGNOSIS — I472 Ventricular tachycardia, unspecified: Secondary | ICD-10-CM | POA: Diagnosis not present

## 2024-09-27 DIAGNOSIS — I255 Ischemic cardiomyopathy: Secondary | ICD-10-CM | POA: Diagnosis not present

## 2024-09-28 DIAGNOSIS — I472 Ventricular tachycardia, unspecified: Secondary | ICD-10-CM | POA: Diagnosis not present

## 2024-09-28 DIAGNOSIS — I2510 Atherosclerotic heart disease of native coronary artery without angina pectoris: Secondary | ICD-10-CM | POA: Diagnosis not present

## 2024-09-28 DIAGNOSIS — Z66 Do not resuscitate: Secondary | ICD-10-CM | POA: Diagnosis not present

## 2024-09-28 DIAGNOSIS — I4891 Unspecified atrial fibrillation: Secondary | ICD-10-CM | POA: Diagnosis not present

## 2024-09-28 DIAGNOSIS — E785 Hyperlipidemia, unspecified: Secondary | ICD-10-CM | POA: Diagnosis not present

## 2024-09-28 DIAGNOSIS — K4090 Unilateral inguinal hernia, without obstruction or gangrene, not specified as recurrent: Secondary | ICD-10-CM | POA: Diagnosis not present

## 2024-09-28 DIAGNOSIS — I255 Ischemic cardiomyopathy: Secondary | ICD-10-CM | POA: Diagnosis not present

## 2024-09-28 DIAGNOSIS — F0390 Unspecified dementia without behavioral disturbance: Secondary | ICD-10-CM | POA: Diagnosis not present

## 2024-09-28 DIAGNOSIS — I5022 Chronic systolic (congestive) heart failure: Secondary | ICD-10-CM | POA: Diagnosis not present

## 2024-09-28 DIAGNOSIS — E039 Hypothyroidism, unspecified: Secondary | ICD-10-CM | POA: Diagnosis not present

## 2024-09-29 DIAGNOSIS — I472 Ventricular tachycardia, unspecified: Secondary | ICD-10-CM | POA: Diagnosis not present

## 2024-09-29 DIAGNOSIS — E039 Hypothyroidism, unspecified: Secondary | ICD-10-CM | POA: Diagnosis not present

## 2024-09-29 DIAGNOSIS — I5022 Chronic systolic (congestive) heart failure: Secondary | ICD-10-CM | POA: Diagnosis not present

## 2024-09-29 DIAGNOSIS — I255 Ischemic cardiomyopathy: Secondary | ICD-10-CM | POA: Diagnosis not present

## 2024-09-29 DIAGNOSIS — I4891 Unspecified atrial fibrillation: Secondary | ICD-10-CM | POA: Diagnosis not present

## 2024-09-29 DIAGNOSIS — I2510 Atherosclerotic heart disease of native coronary artery without angina pectoris: Secondary | ICD-10-CM | POA: Diagnosis not present

## 2024-10-03 DIAGNOSIS — I255 Ischemic cardiomyopathy: Secondary | ICD-10-CM | POA: Diagnosis not present

## 2024-10-03 DIAGNOSIS — I5022 Chronic systolic (congestive) heart failure: Secondary | ICD-10-CM | POA: Diagnosis not present

## 2024-10-03 DIAGNOSIS — I4891 Unspecified atrial fibrillation: Secondary | ICD-10-CM | POA: Diagnosis not present

## 2024-10-03 DIAGNOSIS — I472 Ventricular tachycardia, unspecified: Secondary | ICD-10-CM | POA: Diagnosis not present

## 2024-10-03 DIAGNOSIS — E039 Hypothyroidism, unspecified: Secondary | ICD-10-CM | POA: Diagnosis not present

## 2024-10-03 DIAGNOSIS — I2510 Atherosclerotic heart disease of native coronary artery without angina pectoris: Secondary | ICD-10-CM | POA: Diagnosis not present

## 2024-10-04 DIAGNOSIS — I2510 Atherosclerotic heart disease of native coronary artery without angina pectoris: Secondary | ICD-10-CM | POA: Diagnosis not present

## 2024-10-04 DIAGNOSIS — I255 Ischemic cardiomyopathy: Secondary | ICD-10-CM | POA: Diagnosis not present

## 2024-10-04 DIAGNOSIS — I5022 Chronic systolic (congestive) heart failure: Secondary | ICD-10-CM | POA: Diagnosis not present

## 2024-10-04 DIAGNOSIS — I472 Ventricular tachycardia, unspecified: Secondary | ICD-10-CM | POA: Diagnosis not present

## 2024-10-04 DIAGNOSIS — E039 Hypothyroidism, unspecified: Secondary | ICD-10-CM | POA: Diagnosis not present

## 2024-10-04 DIAGNOSIS — I4891 Unspecified atrial fibrillation: Secondary | ICD-10-CM | POA: Diagnosis not present

## 2024-10-05 DIAGNOSIS — I2510 Atherosclerotic heart disease of native coronary artery without angina pectoris: Secondary | ICD-10-CM | POA: Diagnosis not present

## 2024-10-05 DIAGNOSIS — I255 Ischemic cardiomyopathy: Secondary | ICD-10-CM | POA: Diagnosis not present

## 2024-10-05 DIAGNOSIS — I5022 Chronic systolic (congestive) heart failure: Secondary | ICD-10-CM | POA: Diagnosis not present

## 2024-10-05 DIAGNOSIS — I4891 Unspecified atrial fibrillation: Secondary | ICD-10-CM | POA: Diagnosis not present

## 2024-10-05 DIAGNOSIS — I472 Ventricular tachycardia, unspecified: Secondary | ICD-10-CM | POA: Diagnosis not present

## 2024-10-05 DIAGNOSIS — E039 Hypothyroidism, unspecified: Secondary | ICD-10-CM | POA: Diagnosis not present

## 2024-10-06 DIAGNOSIS — I2510 Atherosclerotic heart disease of native coronary artery without angina pectoris: Secondary | ICD-10-CM | POA: Diagnosis not present

## 2024-10-06 DIAGNOSIS — I5022 Chronic systolic (congestive) heart failure: Secondary | ICD-10-CM | POA: Diagnosis not present

## 2024-10-06 DIAGNOSIS — E039 Hypothyroidism, unspecified: Secondary | ICD-10-CM | POA: Diagnosis not present

## 2024-10-06 DIAGNOSIS — I255 Ischemic cardiomyopathy: Secondary | ICD-10-CM | POA: Diagnosis not present

## 2024-10-06 DIAGNOSIS — I472 Ventricular tachycardia, unspecified: Secondary | ICD-10-CM | POA: Diagnosis not present

## 2024-10-06 DIAGNOSIS — I4891 Unspecified atrial fibrillation: Secondary | ICD-10-CM | POA: Diagnosis not present

## 2024-10-10 DIAGNOSIS — I255 Ischemic cardiomyopathy: Secondary | ICD-10-CM | POA: Diagnosis not present

## 2024-10-10 DIAGNOSIS — I4891 Unspecified atrial fibrillation: Secondary | ICD-10-CM | POA: Diagnosis not present

## 2024-10-10 DIAGNOSIS — E039 Hypothyroidism, unspecified: Secondary | ICD-10-CM | POA: Diagnosis not present

## 2024-10-10 DIAGNOSIS — I5022 Chronic systolic (congestive) heart failure: Secondary | ICD-10-CM | POA: Diagnosis not present

## 2024-10-10 DIAGNOSIS — I2510 Atherosclerotic heart disease of native coronary artery without angina pectoris: Secondary | ICD-10-CM | POA: Diagnosis not present

## 2024-10-10 DIAGNOSIS — I472 Ventricular tachycardia, unspecified: Secondary | ICD-10-CM | POA: Diagnosis not present

## 2024-10-11 DIAGNOSIS — E039 Hypothyroidism, unspecified: Secondary | ICD-10-CM | POA: Diagnosis not present

## 2024-10-11 DIAGNOSIS — I5022 Chronic systolic (congestive) heart failure: Secondary | ICD-10-CM | POA: Diagnosis not present

## 2024-10-11 DIAGNOSIS — I255 Ischemic cardiomyopathy: Secondary | ICD-10-CM | POA: Diagnosis not present

## 2024-10-11 DIAGNOSIS — I2510 Atherosclerotic heart disease of native coronary artery without angina pectoris: Secondary | ICD-10-CM | POA: Diagnosis not present

## 2024-10-11 DIAGNOSIS — I4891 Unspecified atrial fibrillation: Secondary | ICD-10-CM | POA: Diagnosis not present

## 2024-10-11 DIAGNOSIS — I472 Ventricular tachycardia, unspecified: Secondary | ICD-10-CM | POA: Diagnosis not present

## 2024-10-12 DIAGNOSIS — I2510 Atherosclerotic heart disease of native coronary artery without angina pectoris: Secondary | ICD-10-CM | POA: Diagnosis not present

## 2024-10-12 DIAGNOSIS — I472 Ventricular tachycardia, unspecified: Secondary | ICD-10-CM | POA: Diagnosis not present

## 2024-10-12 DIAGNOSIS — E039 Hypothyroidism, unspecified: Secondary | ICD-10-CM | POA: Diagnosis not present

## 2024-10-12 DIAGNOSIS — I4891 Unspecified atrial fibrillation: Secondary | ICD-10-CM | POA: Diagnosis not present

## 2024-10-12 DIAGNOSIS — I255 Ischemic cardiomyopathy: Secondary | ICD-10-CM | POA: Diagnosis not present

## 2024-10-12 DIAGNOSIS — I5022 Chronic systolic (congestive) heart failure: Secondary | ICD-10-CM | POA: Diagnosis not present

## 2024-10-13 DIAGNOSIS — E039 Hypothyroidism, unspecified: Secondary | ICD-10-CM | POA: Diagnosis not present

## 2024-10-13 DIAGNOSIS — I255 Ischemic cardiomyopathy: Secondary | ICD-10-CM | POA: Diagnosis not present

## 2024-10-13 DIAGNOSIS — I2510 Atherosclerotic heart disease of native coronary artery without angina pectoris: Secondary | ICD-10-CM | POA: Diagnosis not present

## 2024-10-13 DIAGNOSIS — I5022 Chronic systolic (congestive) heart failure: Secondary | ICD-10-CM | POA: Diagnosis not present

## 2024-10-13 DIAGNOSIS — I4891 Unspecified atrial fibrillation: Secondary | ICD-10-CM | POA: Diagnosis not present

## 2024-10-13 DIAGNOSIS — I472 Ventricular tachycardia, unspecified: Secondary | ICD-10-CM | POA: Diagnosis not present

## 2024-10-17 DIAGNOSIS — I4891 Unspecified atrial fibrillation: Secondary | ICD-10-CM | POA: Diagnosis not present

## 2024-10-17 DIAGNOSIS — I5022 Chronic systolic (congestive) heart failure: Secondary | ICD-10-CM | POA: Diagnosis not present

## 2024-10-17 DIAGNOSIS — I255 Ischemic cardiomyopathy: Secondary | ICD-10-CM | POA: Diagnosis not present

## 2024-10-17 DIAGNOSIS — E039 Hypothyroidism, unspecified: Secondary | ICD-10-CM | POA: Diagnosis not present

## 2024-10-17 DIAGNOSIS — I472 Ventricular tachycardia, unspecified: Secondary | ICD-10-CM | POA: Diagnosis not present

## 2024-10-17 DIAGNOSIS — I2510 Atherosclerotic heart disease of native coronary artery without angina pectoris: Secondary | ICD-10-CM | POA: Diagnosis not present

## 2024-10-18 DIAGNOSIS — I472 Ventricular tachycardia, unspecified: Secondary | ICD-10-CM | POA: Diagnosis not present

## 2024-10-18 DIAGNOSIS — E039 Hypothyroidism, unspecified: Secondary | ICD-10-CM | POA: Diagnosis not present

## 2024-10-18 DIAGNOSIS — I5022 Chronic systolic (congestive) heart failure: Secondary | ICD-10-CM | POA: Diagnosis not present

## 2024-10-18 DIAGNOSIS — I2510 Atherosclerotic heart disease of native coronary artery without angina pectoris: Secondary | ICD-10-CM | POA: Diagnosis not present

## 2024-10-18 DIAGNOSIS — I255 Ischemic cardiomyopathy: Secondary | ICD-10-CM | POA: Diagnosis not present

## 2024-10-18 DIAGNOSIS — I4891 Unspecified atrial fibrillation: Secondary | ICD-10-CM | POA: Diagnosis not present

## 2024-10-19 DIAGNOSIS — I472 Ventricular tachycardia, unspecified: Secondary | ICD-10-CM | POA: Diagnosis not present

## 2024-10-19 DIAGNOSIS — I4891 Unspecified atrial fibrillation: Secondary | ICD-10-CM | POA: Diagnosis not present

## 2024-10-19 DIAGNOSIS — I5022 Chronic systolic (congestive) heart failure: Secondary | ICD-10-CM | POA: Diagnosis not present

## 2024-10-19 DIAGNOSIS — I2510 Atherosclerotic heart disease of native coronary artery without angina pectoris: Secondary | ICD-10-CM | POA: Diagnosis not present

## 2024-10-19 DIAGNOSIS — I255 Ischemic cardiomyopathy: Secondary | ICD-10-CM | POA: Diagnosis not present

## 2024-10-19 DIAGNOSIS — E039 Hypothyroidism, unspecified: Secondary | ICD-10-CM | POA: Diagnosis not present

## 2024-10-20 DIAGNOSIS — I5022 Chronic systolic (congestive) heart failure: Secondary | ICD-10-CM | POA: Diagnosis not present

## 2024-10-20 DIAGNOSIS — I2510 Atherosclerotic heart disease of native coronary artery without angina pectoris: Secondary | ICD-10-CM | POA: Diagnosis not present

## 2024-10-20 DIAGNOSIS — I255 Ischemic cardiomyopathy: Secondary | ICD-10-CM | POA: Diagnosis not present

## 2024-10-20 DIAGNOSIS — I4891 Unspecified atrial fibrillation: Secondary | ICD-10-CM | POA: Diagnosis not present

## 2024-10-20 DIAGNOSIS — I472 Ventricular tachycardia, unspecified: Secondary | ICD-10-CM | POA: Diagnosis not present

## 2024-10-20 DIAGNOSIS — E039 Hypothyroidism, unspecified: Secondary | ICD-10-CM | POA: Diagnosis not present

## 2024-10-24 DIAGNOSIS — E039 Hypothyroidism, unspecified: Secondary | ICD-10-CM | POA: Diagnosis not present

## 2024-10-24 DIAGNOSIS — I472 Ventricular tachycardia, unspecified: Secondary | ICD-10-CM | POA: Diagnosis not present

## 2024-10-24 DIAGNOSIS — I4891 Unspecified atrial fibrillation: Secondary | ICD-10-CM | POA: Diagnosis not present

## 2024-10-24 DIAGNOSIS — I5022 Chronic systolic (congestive) heart failure: Secondary | ICD-10-CM | POA: Diagnosis not present

## 2024-10-24 DIAGNOSIS — I255 Ischemic cardiomyopathy: Secondary | ICD-10-CM | POA: Diagnosis not present

## 2024-10-24 DIAGNOSIS — I2510 Atherosclerotic heart disease of native coronary artery without angina pectoris: Secondary | ICD-10-CM | POA: Diagnosis not present

## 2024-10-25 DIAGNOSIS — I4891 Unspecified atrial fibrillation: Secondary | ICD-10-CM | POA: Diagnosis not present

## 2024-10-25 DIAGNOSIS — I2510 Atherosclerotic heart disease of native coronary artery without angina pectoris: Secondary | ICD-10-CM | POA: Diagnosis not present

## 2024-10-25 DIAGNOSIS — I255 Ischemic cardiomyopathy: Secondary | ICD-10-CM | POA: Diagnosis not present

## 2024-10-25 DIAGNOSIS — E039 Hypothyroidism, unspecified: Secondary | ICD-10-CM | POA: Diagnosis not present

## 2024-10-25 DIAGNOSIS — I472 Ventricular tachycardia, unspecified: Secondary | ICD-10-CM | POA: Diagnosis not present

## 2024-10-25 DIAGNOSIS — I5022 Chronic systolic (congestive) heart failure: Secondary | ICD-10-CM | POA: Diagnosis not present

## 2024-10-26 DIAGNOSIS — E039 Hypothyroidism, unspecified: Secondary | ICD-10-CM | POA: Diagnosis not present

## 2024-10-26 DIAGNOSIS — I5022 Chronic systolic (congestive) heart failure: Secondary | ICD-10-CM | POA: Diagnosis not present

## 2024-10-26 DIAGNOSIS — I472 Ventricular tachycardia, unspecified: Secondary | ICD-10-CM | POA: Diagnosis not present

## 2024-10-26 DIAGNOSIS — I4891 Unspecified atrial fibrillation: Secondary | ICD-10-CM | POA: Diagnosis not present

## 2024-10-26 DIAGNOSIS — I2510 Atherosclerotic heart disease of native coronary artery without angina pectoris: Secondary | ICD-10-CM | POA: Diagnosis not present

## 2024-10-26 DIAGNOSIS — I255 Ischemic cardiomyopathy: Secondary | ICD-10-CM | POA: Diagnosis not present

## 2024-10-27 DIAGNOSIS — I255 Ischemic cardiomyopathy: Secondary | ICD-10-CM | POA: Diagnosis not present

## 2024-10-27 DIAGNOSIS — I472 Ventricular tachycardia, unspecified: Secondary | ICD-10-CM | POA: Diagnosis not present

## 2024-10-27 DIAGNOSIS — I2510 Atherosclerotic heart disease of native coronary artery without angina pectoris: Secondary | ICD-10-CM | POA: Diagnosis not present

## 2024-10-27 DIAGNOSIS — I4891 Unspecified atrial fibrillation: Secondary | ICD-10-CM | POA: Diagnosis not present

## 2024-10-27 DIAGNOSIS — I5022 Chronic systolic (congestive) heart failure: Secondary | ICD-10-CM | POA: Diagnosis not present

## 2024-10-27 DIAGNOSIS — E039 Hypothyroidism, unspecified: Secondary | ICD-10-CM | POA: Diagnosis not present

## 2024-10-29 DIAGNOSIS — Z66 Do not resuscitate: Secondary | ICD-10-CM | POA: Diagnosis not present

## 2024-10-29 DIAGNOSIS — I4891 Unspecified atrial fibrillation: Secondary | ICD-10-CM | POA: Diagnosis not present

## 2024-10-29 DIAGNOSIS — I2510 Atherosclerotic heart disease of native coronary artery without angina pectoris: Secondary | ICD-10-CM | POA: Diagnosis not present

## 2024-10-29 DIAGNOSIS — K4090 Unilateral inguinal hernia, without obstruction or gangrene, not specified as recurrent: Secondary | ICD-10-CM | POA: Diagnosis not present

## 2024-10-29 DIAGNOSIS — I5022 Chronic systolic (congestive) heart failure: Secondary | ICD-10-CM | POA: Diagnosis not present

## 2024-10-29 DIAGNOSIS — E785 Hyperlipidemia, unspecified: Secondary | ICD-10-CM | POA: Diagnosis not present

## 2024-10-29 DIAGNOSIS — I472 Ventricular tachycardia, unspecified: Secondary | ICD-10-CM | POA: Diagnosis not present

## 2024-10-29 DIAGNOSIS — E039 Hypothyroidism, unspecified: Secondary | ICD-10-CM | POA: Diagnosis not present

## 2024-10-29 DIAGNOSIS — I255 Ischemic cardiomyopathy: Secondary | ICD-10-CM | POA: Diagnosis not present

## 2024-10-29 DIAGNOSIS — F0390 Unspecified dementia without behavioral disturbance: Secondary | ICD-10-CM | POA: Diagnosis not present

## 2024-10-31 DIAGNOSIS — I255 Ischemic cardiomyopathy: Secondary | ICD-10-CM | POA: Diagnosis not present

## 2024-10-31 DIAGNOSIS — I4891 Unspecified atrial fibrillation: Secondary | ICD-10-CM | POA: Diagnosis not present

## 2024-10-31 DIAGNOSIS — E039 Hypothyroidism, unspecified: Secondary | ICD-10-CM | POA: Diagnosis not present

## 2024-10-31 DIAGNOSIS — I472 Ventricular tachycardia, unspecified: Secondary | ICD-10-CM | POA: Diagnosis not present

## 2024-10-31 DIAGNOSIS — I2510 Atherosclerotic heart disease of native coronary artery without angina pectoris: Secondary | ICD-10-CM | POA: Diagnosis not present

## 2024-10-31 DIAGNOSIS — I5022 Chronic systolic (congestive) heart failure: Secondary | ICD-10-CM | POA: Diagnosis not present

## 2024-11-02 DIAGNOSIS — I255 Ischemic cardiomyopathy: Secondary | ICD-10-CM | POA: Diagnosis not present

## 2024-11-02 DIAGNOSIS — I4891 Unspecified atrial fibrillation: Secondary | ICD-10-CM | POA: Diagnosis not present

## 2024-11-02 DIAGNOSIS — I2510 Atherosclerotic heart disease of native coronary artery without angina pectoris: Secondary | ICD-10-CM | POA: Diagnosis not present

## 2024-11-02 DIAGNOSIS — I5022 Chronic systolic (congestive) heart failure: Secondary | ICD-10-CM | POA: Diagnosis not present

## 2024-11-02 DIAGNOSIS — I472 Ventricular tachycardia, unspecified: Secondary | ICD-10-CM | POA: Diagnosis not present

## 2024-11-02 DIAGNOSIS — E039 Hypothyroidism, unspecified: Secondary | ICD-10-CM | POA: Diagnosis not present

## 2024-11-03 DIAGNOSIS — E039 Hypothyroidism, unspecified: Secondary | ICD-10-CM | POA: Diagnosis not present

## 2024-11-03 DIAGNOSIS — I4891 Unspecified atrial fibrillation: Secondary | ICD-10-CM | POA: Diagnosis not present

## 2024-11-03 DIAGNOSIS — I2510 Atherosclerotic heart disease of native coronary artery without angina pectoris: Secondary | ICD-10-CM | POA: Diagnosis not present

## 2024-11-03 DIAGNOSIS — I472 Ventricular tachycardia, unspecified: Secondary | ICD-10-CM | POA: Diagnosis not present

## 2024-11-03 DIAGNOSIS — I5022 Chronic systolic (congestive) heart failure: Secondary | ICD-10-CM | POA: Diagnosis not present

## 2024-11-03 DIAGNOSIS — I255 Ischemic cardiomyopathy: Secondary | ICD-10-CM | POA: Diagnosis not present

## 2024-11-07 DIAGNOSIS — I5022 Chronic systolic (congestive) heart failure: Secondary | ICD-10-CM | POA: Diagnosis not present

## 2024-11-07 DIAGNOSIS — I2510 Atherosclerotic heart disease of native coronary artery without angina pectoris: Secondary | ICD-10-CM | POA: Diagnosis not present

## 2024-11-07 DIAGNOSIS — I4891 Unspecified atrial fibrillation: Secondary | ICD-10-CM | POA: Diagnosis not present

## 2024-11-07 DIAGNOSIS — I255 Ischemic cardiomyopathy: Secondary | ICD-10-CM | POA: Diagnosis not present

## 2024-11-07 DIAGNOSIS — I472 Ventricular tachycardia, unspecified: Secondary | ICD-10-CM | POA: Diagnosis not present

## 2024-11-07 DIAGNOSIS — E039 Hypothyroidism, unspecified: Secondary | ICD-10-CM | POA: Diagnosis not present

## 2024-11-08 DIAGNOSIS — I255 Ischemic cardiomyopathy: Secondary | ICD-10-CM | POA: Diagnosis not present

## 2024-11-08 DIAGNOSIS — I5022 Chronic systolic (congestive) heart failure: Secondary | ICD-10-CM | POA: Diagnosis not present

## 2024-11-08 DIAGNOSIS — E039 Hypothyroidism, unspecified: Secondary | ICD-10-CM | POA: Diagnosis not present

## 2024-11-08 DIAGNOSIS — I4891 Unspecified atrial fibrillation: Secondary | ICD-10-CM | POA: Diagnosis not present

## 2024-11-08 DIAGNOSIS — I472 Ventricular tachycardia, unspecified: Secondary | ICD-10-CM | POA: Diagnosis not present

## 2024-11-08 DIAGNOSIS — I2510 Atherosclerotic heart disease of native coronary artery without angina pectoris: Secondary | ICD-10-CM | POA: Diagnosis not present

## 2024-11-09 DIAGNOSIS — I255 Ischemic cardiomyopathy: Secondary | ICD-10-CM | POA: Diagnosis not present

## 2024-11-09 DIAGNOSIS — I2510 Atherosclerotic heart disease of native coronary artery without angina pectoris: Secondary | ICD-10-CM | POA: Diagnosis not present

## 2024-11-09 DIAGNOSIS — E039 Hypothyroidism, unspecified: Secondary | ICD-10-CM | POA: Diagnosis not present

## 2024-11-09 DIAGNOSIS — I4891 Unspecified atrial fibrillation: Secondary | ICD-10-CM | POA: Diagnosis not present

## 2024-11-09 DIAGNOSIS — I472 Ventricular tachycardia, unspecified: Secondary | ICD-10-CM | POA: Diagnosis not present

## 2024-11-09 DIAGNOSIS — I5022 Chronic systolic (congestive) heart failure: Secondary | ICD-10-CM | POA: Diagnosis not present

## 2024-11-10 DIAGNOSIS — I255 Ischemic cardiomyopathy: Secondary | ICD-10-CM | POA: Diagnosis not present

## 2024-11-10 DIAGNOSIS — I472 Ventricular tachycardia, unspecified: Secondary | ICD-10-CM | POA: Diagnosis not present

## 2024-11-10 DIAGNOSIS — I4891 Unspecified atrial fibrillation: Secondary | ICD-10-CM | POA: Diagnosis not present

## 2024-11-10 DIAGNOSIS — E039 Hypothyroidism, unspecified: Secondary | ICD-10-CM | POA: Diagnosis not present

## 2024-11-10 DIAGNOSIS — I5022 Chronic systolic (congestive) heart failure: Secondary | ICD-10-CM | POA: Diagnosis not present

## 2024-11-10 DIAGNOSIS — I2510 Atherosclerotic heart disease of native coronary artery without angina pectoris: Secondary | ICD-10-CM | POA: Diagnosis not present

## 2024-11-14 DIAGNOSIS — I255 Ischemic cardiomyopathy: Secondary | ICD-10-CM | POA: Diagnosis not present

## 2024-11-14 DIAGNOSIS — E039 Hypothyroidism, unspecified: Secondary | ICD-10-CM | POA: Diagnosis not present

## 2024-11-14 DIAGNOSIS — I2510 Atherosclerotic heart disease of native coronary artery without angina pectoris: Secondary | ICD-10-CM | POA: Diagnosis not present

## 2024-11-14 DIAGNOSIS — I4891 Unspecified atrial fibrillation: Secondary | ICD-10-CM | POA: Diagnosis not present

## 2024-11-14 DIAGNOSIS — I5022 Chronic systolic (congestive) heart failure: Secondary | ICD-10-CM | POA: Diagnosis not present

## 2024-11-14 DIAGNOSIS — I472 Ventricular tachycardia, unspecified: Secondary | ICD-10-CM | POA: Diagnosis not present

## 2024-11-15 DIAGNOSIS — I472 Ventricular tachycardia, unspecified: Secondary | ICD-10-CM | POA: Diagnosis not present

## 2024-11-15 DIAGNOSIS — I5022 Chronic systolic (congestive) heart failure: Secondary | ICD-10-CM | POA: Diagnosis not present

## 2024-11-15 DIAGNOSIS — E039 Hypothyroidism, unspecified: Secondary | ICD-10-CM | POA: Diagnosis not present

## 2024-11-15 DIAGNOSIS — I2510 Atherosclerotic heart disease of native coronary artery without angina pectoris: Secondary | ICD-10-CM | POA: Diagnosis not present

## 2024-11-15 DIAGNOSIS — I4891 Unspecified atrial fibrillation: Secondary | ICD-10-CM | POA: Diagnosis not present

## 2024-11-15 DIAGNOSIS — I255 Ischemic cardiomyopathy: Secondary | ICD-10-CM | POA: Diagnosis not present

## 2024-11-16 DIAGNOSIS — I2510 Atherosclerotic heart disease of native coronary artery without angina pectoris: Secondary | ICD-10-CM | POA: Diagnosis not present

## 2024-11-16 DIAGNOSIS — I4891 Unspecified atrial fibrillation: Secondary | ICD-10-CM | POA: Diagnosis not present

## 2024-11-16 DIAGNOSIS — E039 Hypothyroidism, unspecified: Secondary | ICD-10-CM | POA: Diagnosis not present

## 2024-11-16 DIAGNOSIS — I5022 Chronic systolic (congestive) heart failure: Secondary | ICD-10-CM | POA: Diagnosis not present

## 2024-11-16 DIAGNOSIS — I472 Ventricular tachycardia, unspecified: Secondary | ICD-10-CM | POA: Diagnosis not present

## 2024-11-16 DIAGNOSIS — I255 Ischemic cardiomyopathy: Secondary | ICD-10-CM | POA: Diagnosis not present

## 2024-11-17 DIAGNOSIS — E039 Hypothyroidism, unspecified: Secondary | ICD-10-CM | POA: Diagnosis not present

## 2024-11-17 DIAGNOSIS — I472 Ventricular tachycardia, unspecified: Secondary | ICD-10-CM | POA: Diagnosis not present

## 2024-11-17 DIAGNOSIS — I4891 Unspecified atrial fibrillation: Secondary | ICD-10-CM | POA: Diagnosis not present

## 2024-11-17 DIAGNOSIS — I5022 Chronic systolic (congestive) heart failure: Secondary | ICD-10-CM | POA: Diagnosis not present

## 2024-11-17 DIAGNOSIS — I2510 Atherosclerotic heart disease of native coronary artery without angina pectoris: Secondary | ICD-10-CM | POA: Diagnosis not present

## 2024-11-17 DIAGNOSIS — I255 Ischemic cardiomyopathy: Secondary | ICD-10-CM | POA: Diagnosis not present

## 2024-11-21 DIAGNOSIS — I4891 Unspecified atrial fibrillation: Secondary | ICD-10-CM | POA: Diagnosis not present

## 2024-11-21 DIAGNOSIS — I5022 Chronic systolic (congestive) heart failure: Secondary | ICD-10-CM | POA: Diagnosis not present

## 2024-11-21 DIAGNOSIS — E039 Hypothyroidism, unspecified: Secondary | ICD-10-CM | POA: Diagnosis not present

## 2024-11-21 DIAGNOSIS — I255 Ischemic cardiomyopathy: Secondary | ICD-10-CM | POA: Diagnosis not present

## 2024-11-21 DIAGNOSIS — I472 Ventricular tachycardia, unspecified: Secondary | ICD-10-CM | POA: Diagnosis not present

## 2024-11-21 DIAGNOSIS — I2510 Atherosclerotic heart disease of native coronary artery without angina pectoris: Secondary | ICD-10-CM | POA: Diagnosis not present

## 2024-11-22 DIAGNOSIS — I2510 Atherosclerotic heart disease of native coronary artery without angina pectoris: Secondary | ICD-10-CM | POA: Diagnosis not present

## 2024-11-22 DIAGNOSIS — I4891 Unspecified atrial fibrillation: Secondary | ICD-10-CM | POA: Diagnosis not present

## 2024-11-22 DIAGNOSIS — E039 Hypothyroidism, unspecified: Secondary | ICD-10-CM | POA: Diagnosis not present

## 2024-11-22 DIAGNOSIS — I5022 Chronic systolic (congestive) heart failure: Secondary | ICD-10-CM | POA: Diagnosis not present

## 2024-11-22 DIAGNOSIS — I472 Ventricular tachycardia, unspecified: Secondary | ICD-10-CM | POA: Diagnosis not present

## 2024-11-22 DIAGNOSIS — I255 Ischemic cardiomyopathy: Secondary | ICD-10-CM | POA: Diagnosis not present

## 2024-11-23 DIAGNOSIS — E039 Hypothyroidism, unspecified: Secondary | ICD-10-CM | POA: Diagnosis not present

## 2024-11-23 DIAGNOSIS — I472 Ventricular tachycardia, unspecified: Secondary | ICD-10-CM | POA: Diagnosis not present

## 2024-11-23 DIAGNOSIS — I4891 Unspecified atrial fibrillation: Secondary | ICD-10-CM | POA: Diagnosis not present

## 2024-11-23 DIAGNOSIS — I255 Ischemic cardiomyopathy: Secondary | ICD-10-CM | POA: Diagnosis not present

## 2024-11-23 DIAGNOSIS — I2510 Atherosclerotic heart disease of native coronary artery without angina pectoris: Secondary | ICD-10-CM | POA: Diagnosis not present

## 2024-11-23 DIAGNOSIS — I5022 Chronic systolic (congestive) heart failure: Secondary | ICD-10-CM | POA: Diagnosis not present

## 2024-11-24 DIAGNOSIS — I2510 Atherosclerotic heart disease of native coronary artery without angina pectoris: Secondary | ICD-10-CM | POA: Diagnosis not present

## 2024-11-24 DIAGNOSIS — I255 Ischemic cardiomyopathy: Secondary | ICD-10-CM | POA: Diagnosis not present

## 2024-11-24 DIAGNOSIS — I4891 Unspecified atrial fibrillation: Secondary | ICD-10-CM | POA: Diagnosis not present

## 2024-11-24 DIAGNOSIS — I5022 Chronic systolic (congestive) heart failure: Secondary | ICD-10-CM | POA: Diagnosis not present

## 2024-11-24 DIAGNOSIS — E039 Hypothyroidism, unspecified: Secondary | ICD-10-CM | POA: Diagnosis not present

## 2024-11-24 DIAGNOSIS — I472 Ventricular tachycardia, unspecified: Secondary | ICD-10-CM | POA: Diagnosis not present

## 2024-11-25 DIAGNOSIS — I4891 Unspecified atrial fibrillation: Secondary | ICD-10-CM | POA: Diagnosis not present

## 2024-11-25 DIAGNOSIS — I255 Ischemic cardiomyopathy: Secondary | ICD-10-CM | POA: Diagnosis not present

## 2024-11-25 DIAGNOSIS — I472 Ventricular tachycardia, unspecified: Secondary | ICD-10-CM | POA: Diagnosis not present

## 2024-11-25 DIAGNOSIS — E039 Hypothyroidism, unspecified: Secondary | ICD-10-CM | POA: Diagnosis not present

## 2024-11-25 DIAGNOSIS — I5022 Chronic systolic (congestive) heart failure: Secondary | ICD-10-CM | POA: Diagnosis not present

## 2024-11-25 DIAGNOSIS — I2510 Atherosclerotic heart disease of native coronary artery without angina pectoris: Secondary | ICD-10-CM | POA: Diagnosis not present

## 2024-11-27 DIAGNOSIS — E039 Hypothyroidism, unspecified: Secondary | ICD-10-CM | POA: Diagnosis not present

## 2024-11-27 DIAGNOSIS — I255 Ischemic cardiomyopathy: Secondary | ICD-10-CM | POA: Diagnosis not present

## 2024-11-27 DIAGNOSIS — I472 Ventricular tachycardia, unspecified: Secondary | ICD-10-CM | POA: Diagnosis not present

## 2024-11-27 DIAGNOSIS — I4891 Unspecified atrial fibrillation: Secondary | ICD-10-CM | POA: Diagnosis not present

## 2024-11-27 DIAGNOSIS — I5022 Chronic systolic (congestive) heart failure: Secondary | ICD-10-CM | POA: Diagnosis not present

## 2024-11-27 DIAGNOSIS — I2510 Atherosclerotic heart disease of native coronary artery without angina pectoris: Secondary | ICD-10-CM | POA: Diagnosis not present

## 2024-11-28 DEATH — deceased
